# Patient Record
Sex: Female | Born: 1955
Health system: Southern US, Community
[De-identification: ages and names within clinical notes are randomized; demographics above are authoritative.]

## PROBLEM LIST (undated history)

## (undated) DIAGNOSIS — I498 Other specified cardiac arrhythmias: Secondary | ICD-10-CM

## (undated) DIAGNOSIS — J309 Allergic rhinitis, unspecified: Secondary | ICD-10-CM

## (undated) DIAGNOSIS — R5383 Other fatigue: Secondary | ICD-10-CM

## (undated) DIAGNOSIS — G5600 Carpal tunnel syndrome, unspecified upper limb: Secondary | ICD-10-CM

## (undated) DIAGNOSIS — R5381 Other malaise: Secondary | ICD-10-CM

## (undated) DIAGNOSIS — R7301 Impaired fasting glucose: Secondary | ICD-10-CM

## (undated) DIAGNOSIS — N951 Menopausal and female climacteric states: Secondary | ICD-10-CM

## (undated) DIAGNOSIS — J45909 Unspecified asthma, uncomplicated: Secondary | ICD-10-CM

## (undated) DIAGNOSIS — G4733 Obstructive sleep apnea (adult) (pediatric): Secondary | ICD-10-CM

## (undated) DIAGNOSIS — H409 Unspecified glaucoma: Secondary | ICD-10-CM

## (undated) DIAGNOSIS — H269 Unspecified cataract: Secondary | ICD-10-CM

## (undated) DIAGNOSIS — M2550 Pain in unspecified joint: Secondary | ICD-10-CM

## (undated) DIAGNOSIS — M1990 Unspecified osteoarthritis, unspecified site: Secondary | ICD-10-CM

## (undated) DIAGNOSIS — E119 Type 2 diabetes mellitus without complications: Secondary | ICD-10-CM

## (undated) DIAGNOSIS — K219 Gastro-esophageal reflux disease without esophagitis: Secondary | ICD-10-CM

## (undated) DIAGNOSIS — I10 Essential (primary) hypertension: Secondary | ICD-10-CM

## (undated) HISTORY — DX: Pain in unspecified joint: M25.50

## (undated) HISTORY — PX: OTHER SURGICAL HISTORY: SHX169

## (undated) HISTORY — DX: Other malaise: R53.81

## (undated) HISTORY — DX: Allergic rhinitis, unspecified: J30.9

## (undated) HISTORY — DX: Unspecified cataract: H26.9

## (undated) HISTORY — DX: Unspecified glaucoma: H40.9

## (undated) HISTORY — PX: JOINT REPLACEMENT: SHX530

## (undated) HISTORY — DX: Menopausal and female climacteric states: N95.1

## (undated) HISTORY — PX: ABDOMINAL HYSTERECTOMY: SHX81

## (undated) HISTORY — DX: Other malaise: R53.83

## (undated) HISTORY — DX: Carpal tunnel syndrome, unspecified upper limb: G56.00

## (undated) HISTORY — DX: Syncope and collapse: R55

## (undated) HISTORY — DX: Other specified cardiac arrhythmias: I49.8

## (undated) HISTORY — DX: Impaired fasting glucose: R73.01

## (undated) HISTORY — DX: Morbid (severe) obesity due to excess calories: E66.01

## (undated) HISTORY — DX: Obstructive sleep apnea (adult) (pediatric): G47.33

## (undated) HISTORY — PX: TONSILLECTOMY: SUR1361

---

## 1998-06-25 ENCOUNTER — Other Ambulatory Visit: Admission: RE | Admit: 1998-06-25 | Discharge: 1998-06-25 | Payer: Self-pay | Admitting: Obstetrics and Gynecology

## 2000-11-13 ENCOUNTER — Emergency Department (HOSPITAL_COMMUNITY): Admission: EM | Admit: 2000-11-13 | Discharge: 2000-11-14 | Payer: Self-pay | Admitting: Emergency Medicine

## 2002-02-15 ENCOUNTER — Encounter: Admission: RE | Admit: 2002-02-15 | Discharge: 2002-05-16 | Payer: Self-pay | Admitting: Internal Medicine

## 2004-05-03 ENCOUNTER — Encounter: Admission: RE | Admit: 2004-05-03 | Discharge: 2004-05-03 | Payer: Self-pay | Admitting: Internal Medicine

## 2004-11-04 ENCOUNTER — Other Ambulatory Visit: Admission: RE | Admit: 2004-11-04 | Discharge: 2004-11-04 | Payer: Self-pay | Admitting: Family Medicine

## 2005-01-17 ENCOUNTER — Ambulatory Visit (HOSPITAL_COMMUNITY): Admission: RE | Admit: 2005-01-17 | Discharge: 2005-01-17 | Payer: Self-pay | Admitting: Gastroenterology

## 2005-07-29 ENCOUNTER — Emergency Department (HOSPITAL_COMMUNITY): Admission: EM | Admit: 2005-07-29 | Discharge: 2005-07-30 | Payer: Self-pay | Admitting: Emergency Medicine

## 2005-08-13 ENCOUNTER — Encounter: Admission: RE | Admit: 2005-08-13 | Discharge: 2005-11-11 | Payer: Self-pay | Admitting: Family Medicine

## 2005-08-22 ENCOUNTER — Encounter: Admission: RE | Admit: 2005-08-22 | Discharge: 2005-08-22 | Payer: Self-pay | Admitting: Gastroenterology

## 2005-09-18 ENCOUNTER — Emergency Department (HOSPITAL_COMMUNITY): Admission: EM | Admit: 2005-09-18 | Discharge: 2005-09-18 | Payer: Self-pay | Admitting: Emergency Medicine

## 2006-09-19 ENCOUNTER — Emergency Department (HOSPITAL_COMMUNITY): Admission: EM | Admit: 2006-09-19 | Discharge: 2006-09-19 | Payer: Self-pay | Admitting: Emergency Medicine

## 2008-02-21 LAB — HM PAP SMEAR: HM Pap smear: NORMAL

## 2008-03-13 ENCOUNTER — Encounter: Admission: RE | Admit: 2008-03-13 | Discharge: 2008-03-13 | Payer: Self-pay | Admitting: Gastroenterology

## 2008-06-15 ENCOUNTER — Emergency Department (HOSPITAL_COMMUNITY): Admission: EM | Admit: 2008-06-15 | Discharge: 2008-06-15 | Payer: Self-pay | Admitting: Emergency Medicine

## 2008-10-25 ENCOUNTER — Inpatient Hospital Stay (HOSPITAL_COMMUNITY): Admission: RE | Admit: 2008-10-25 | Discharge: 2008-10-30 | Payer: Self-pay | Admitting: Orthopedic Surgery

## 2009-03-14 ENCOUNTER — Inpatient Hospital Stay (HOSPITAL_COMMUNITY): Admission: RE | Admit: 2009-03-14 | Discharge: 2009-03-18 | Payer: Self-pay | Admitting: Orthopedic Surgery

## 2010-04-28 ENCOUNTER — Encounter: Admission: RE | Admit: 2010-04-28 | Discharge: 2010-04-28 | Payer: Self-pay | Admitting: Sports Medicine

## 2010-08-29 LAB — URINALYSIS, MICROSCOPIC ONLY
Bilirubin Urine: NEGATIVE
Bilirubin Urine: NEGATIVE
Glucose, UA: NEGATIVE mg/dL
Glucose, UA: NEGATIVE mg/dL
Ketones, ur: NEGATIVE mg/dL
Ketones, ur: NEGATIVE mg/dL
Nitrite: NEGATIVE
Nitrite: NEGATIVE
Protein, ur: NEGATIVE mg/dL
Protein, ur: NEGATIVE mg/dL
Specific Gravity, Urine: 1.012 (ref 1.005–1.030)
Specific Gravity, Urine: 1.013 (ref 1.005–1.030)
Urobilinogen, UA: 0.2 mg/dL (ref 0.0–1.0)
Urobilinogen, UA: 0.2 mg/dL (ref 0.0–1.0)
pH: 6 (ref 5.0–8.0)
pH: 7 (ref 5.0–8.0)

## 2010-08-29 LAB — CBC
HCT: 31.9 % — ABNORMAL LOW (ref 36.0–46.0)
HCT: 33.1 % — ABNORMAL LOW (ref 36.0–46.0)
HCT: 34 % — ABNORMAL LOW (ref 36.0–46.0)
HCT: 36.3 % (ref 36.0–46.0)
Hemoglobin: 10.8 g/dL — ABNORMAL LOW (ref 12.0–15.0)
Hemoglobin: 11.3 g/dL — ABNORMAL LOW (ref 12.0–15.0)
Hemoglobin: 11.4 g/dL — ABNORMAL LOW (ref 12.0–15.0)
Hemoglobin: 12.2 g/dL (ref 12.0–15.0)
MCHC: 33.7 g/dL (ref 30.0–36.0)
MCHC: 33.7 g/dL (ref 30.0–36.0)
MCHC: 33.9 g/dL (ref 30.0–36.0)
MCHC: 34 g/dL (ref 30.0–36.0)
MCV: 82.6 fL (ref 78.0–100.0)
MCV: 82.8 fL (ref 78.0–100.0)
MCV: 83 fL (ref 78.0–100.0)
MCV: 83.1 fL (ref 78.0–100.0)
Platelets: 253 10*3/uL (ref 150–400)
Platelets: 270 10*3/uL (ref 150–400)
Platelets: 286 10*3/uL (ref 150–400)
Platelets: 323 10*3/uL (ref 150–400)
Platelets: 364 10*3/uL (ref 150–400)
RBC: 3.86 MIL/uL — ABNORMAL LOW (ref 3.87–5.11)
RBC: 4 MIL/uL (ref 3.87–5.11)
RBC: 4.09 MIL/uL (ref 3.87–5.11)
RBC: 4.37 MIL/uL (ref 3.87–5.11)
RBC: 5.11 MIL/uL (ref 3.87–5.11)
RDW: 15.3 % (ref 11.5–15.5)
RDW: 15.9 % — ABNORMAL HIGH (ref 11.5–15.5)
RDW: 16 % — ABNORMAL HIGH (ref 11.5–15.5)
RDW: 16.5 % — ABNORMAL HIGH (ref 11.5–15.5)
WBC: 10.4 10*3/uL (ref 4.0–10.5)
WBC: 10.4 10*3/uL (ref 4.0–10.5)
WBC: 12.5 10*3/uL — ABNORMAL HIGH (ref 4.0–10.5)
WBC: 12.6 10*3/uL — ABNORMAL HIGH (ref 4.0–10.5)
WBC: 8.9 10*3/uL (ref 4.0–10.5)

## 2010-08-29 LAB — BASIC METABOLIC PANEL
BUN: 3 mg/dL — ABNORMAL LOW (ref 6–23)
BUN: 4 mg/dL — ABNORMAL LOW (ref 6–23)
BUN: 4 mg/dL — ABNORMAL LOW (ref 6–23)
BUN: 4 mg/dL — ABNORMAL LOW (ref 6–23)
BUN: 6 mg/dL (ref 6–23)
CO2: 27 mEq/L (ref 19–32)
CO2: 28 mEq/L (ref 19–32)
CO2: 28 mEq/L (ref 19–32)
CO2: 29 mEq/L (ref 19–32)
CO2: 32 mEq/L (ref 19–32)
Calcium: 8.6 mg/dL (ref 8.4–10.5)
Calcium: 8.8 mg/dL (ref 8.4–10.5)
Calcium: 8.9 mg/dL (ref 8.4–10.5)
Calcium: 9.1 mg/dL (ref 8.4–10.5)
Calcium: 9.2 mg/dL (ref 8.4–10.5)
Chloride: 92 mEq/L — ABNORMAL LOW (ref 96–112)
Chloride: 94 mEq/L — ABNORMAL LOW (ref 96–112)
Chloride: 95 mEq/L — ABNORMAL LOW (ref 96–112)
Chloride: 96 mEq/L (ref 96–112)
Chloride: 97 mEq/L (ref 96–112)
Creatinine, Ser: 0.59 mg/dL (ref 0.4–1.2)
Creatinine, Ser: 0.6 mg/dL (ref 0.4–1.2)
Creatinine, Ser: 0.66 mg/dL (ref 0.4–1.2)
Creatinine, Ser: 0.66 mg/dL (ref 0.4–1.2)
Creatinine, Ser: 0.75 mg/dL (ref 0.4–1.2)
GFR calc Af Amer: >60 mL/min (ref >60)
GFR calc Af Amer: >60 mL/min (ref >60)
GFR calc Af Amer: >60 mL/min (ref >60)
GFR calc Af Amer: >60 mL/min (ref >60)
GFR calc Af Amer: >60 mL/min (ref >60)
GFR calc non Af Amer: >60 mL/min (ref >60)
GFR calc non Af Amer: >60 mL/min (ref >60)
GFR calc non Af Amer: >60 mL/min (ref >60)
GFR calc non Af Amer: >60 mL/min (ref >60)
GFR calc non Af Amer: >60 mL/min (ref >60)
Glucose, Bld: 113 mg/dL — ABNORMAL HIGH (ref 70–99)
Glucose, Bld: 134 mg/dL — ABNORMAL HIGH (ref 70–99)
Glucose, Bld: 148 mg/dL — ABNORMAL HIGH (ref 70–99)
Glucose, Bld: 150 mg/dL — ABNORMAL HIGH (ref 70–99)
Glucose, Bld: 158 mg/dL — ABNORMAL HIGH (ref 70–99)
Potassium: 2.9 mEq/L — ABNORMAL LOW (ref 3.5–5.1)
Potassium: 3.1 mEq/L — ABNORMAL LOW (ref 3.5–5.1)
Potassium: 3.1 mEq/L — ABNORMAL LOW (ref 3.5–5.1)
Potassium: 3.3 mEq/L — ABNORMAL LOW (ref 3.5–5.1)
Potassium: 3.5 mEq/L (ref 3.5–5.1)
Sodium: 130 mEq/L — ABNORMAL LOW (ref 135–145)
Sodium: 133 mEq/L — ABNORMAL LOW (ref 135–145)
Sodium: 134 mEq/L — ABNORMAL LOW (ref 135–145)
Sodium: 136 mEq/L (ref 135–145)
Sodium: 137 mEq/L (ref 135–145)

## 2010-08-29 LAB — URINE CULTURE
Colony Count: >=100000
Colony Count: NO GROWTH
Culture: NO GROWTH

## 2010-08-29 LAB — URINALYSIS, ROUTINE W REFLEX MICROSCOPIC
Bilirubin Urine: NEGATIVE
Glucose, UA: NEGATIVE mg/dL
Ketones, ur: NEGATIVE mg/dL
Nitrite: NEGATIVE
Specific Gravity, Urine: 1.021 (ref 1.005–1.030)
pH: 5.5 (ref 5.0–8.0)

## 2010-08-29 LAB — COMPREHENSIVE METABOLIC PANEL
ALT: 16 U/L (ref 0–35)
AST: 20 U/L (ref 0–37)
Albumin: 4.7 g/dL (ref 3.5–5.2)
CO2: 27 mEq/L (ref 19–32)
Chloride: 102 mEq/L (ref 96–112)
GFR calc Af Amer: >60 mL/min (ref >60)
GFR calc non Af Amer: >60 mL/min (ref >60)
Sodium: 139 mEq/L (ref 135–145)
Total Bilirubin: 0.5 mg/dL (ref 0.3–1.2)

## 2010-08-29 LAB — PROTIME-INR
INR: 1.07 (ref 0.00–1.49)
INR: 1.41 (ref 0.00–1.49)
INR: 1.6 — ABNORMAL HIGH (ref 0.00–1.49)
INR: 1.7 — ABNORMAL HIGH (ref 0.00–1.49)
Prothrombin Time: 13.8 seconds (ref 11.6–15.2)
Prothrombin Time: 17.1 seconds — ABNORMAL HIGH (ref 11.6–15.2)
Prothrombin Time: 18.9 seconds — ABNORMAL HIGH (ref 11.6–15.2)
Prothrombin Time: 19.8 seconds — ABNORMAL HIGH (ref 11.6–15.2)

## 2010-08-29 LAB — TYPE AND SCREEN: Antibody Screen: NEGATIVE

## 2010-09-02 LAB — CBC
HCT: 35 % — ABNORMAL LOW (ref 36.0–46.0)
Hemoglobin: 11.6 g/dL — ABNORMAL LOW (ref 12.0–15.0)
MCHC: 33.2 g/dL (ref 30.0–36.0)
MCHC: 33.3 g/dL (ref 30.0–36.0)
MCHC: 33.8 g/dL (ref 30.0–36.0)
MCV: 83.8 fL (ref 78.0–100.0)
MCV: 83.9 fL (ref 78.0–100.0)
MCV: 84 fL (ref 78.0–100.0)
Platelets: 269 10*3/uL (ref 150–400)
Platelets: 288 10*3/uL (ref 150–400)
RBC: 4.08 MIL/uL (ref 3.87–5.11)
RDW: 15.7 % — ABNORMAL HIGH (ref 11.5–15.5)
RDW: 15.7 % — ABNORMAL HIGH (ref 11.5–15.5)

## 2010-09-02 LAB — BASIC METABOLIC PANEL
BUN: 4 mg/dL — ABNORMAL LOW (ref 6–23)
BUN: 4 mg/dL — ABNORMAL LOW (ref 6–23)
BUN: 5 mg/dL — ABNORMAL LOW (ref 6–23)
BUN: 5 mg/dL — ABNORMAL LOW (ref 6–23)
CO2: 29 mEq/L (ref 19–32)
CO2: 30 mEq/L (ref 19–32)
CO2: 31 mEq/L (ref 19–32)
Calcium: 8.8 mg/dL (ref 8.4–10.5)
Calcium: 9.2 mg/dL (ref 8.4–10.5)
Calcium: 9.2 mg/dL (ref 8.4–10.5)
Chloride: 98 mEq/L (ref 96–112)
Chloride: 99 mEq/L (ref 96–112)
Creatinine, Ser: 0.7 mg/dL (ref 0.4–1.2)
Creatinine, Ser: 0.71 mg/dL (ref 0.4–1.2)
Creatinine, Ser: 0.76 mg/dL (ref 0.4–1.2)
GFR calc Af Amer: >60 mL/min (ref >60)
GFR calc non Af Amer: >60 mL/min (ref >60)
GFR calc non Af Amer: >60 mL/min (ref >60)
Glucose, Bld: 104 mg/dL — ABNORMAL HIGH (ref 70–99)
Glucose, Bld: 131 mg/dL — ABNORMAL HIGH (ref 70–99)
Glucose, Bld: 156 mg/dL — ABNORMAL HIGH (ref 70–99)
Potassium: 3.4 mEq/L — ABNORMAL LOW (ref 3.5–5.1)
Sodium: 140 mEq/L (ref 135–145)

## 2010-09-02 LAB — URINALYSIS, MICROSCOPIC ONLY
Glucose, UA: NEGATIVE mg/dL
Ketones, ur: NEGATIVE mg/dL
Leukocytes, UA: NEGATIVE
Nitrite: NEGATIVE
Protein, ur: NEGATIVE mg/dL
pH: 6 (ref 5.0–8.0)

## 2010-09-02 LAB — PROTIME-INR
INR: 1.3 (ref 0.00–1.49)
INR: 1.3 (ref 0.00–1.49)
Prothrombin Time: 17.1 seconds — ABNORMAL HIGH (ref 11.6–15.2)
Prothrombin Time: 17.1 seconds — ABNORMAL HIGH (ref 11.6–15.2)

## 2010-09-02 LAB — URINE CULTURE: Colony Count: >=100000

## 2010-09-03 LAB — COMPREHENSIVE METABOLIC PANEL
Alkaline Phosphatase: 67 U/L (ref 39–117)
BUN: 13 mg/dL (ref 6–23)
Creatinine, Ser: 0.76 mg/dL (ref 0.4–1.2)
Glucose, Bld: 96 mg/dL (ref 70–99)
Potassium: 3.8 mEq/L (ref 3.5–5.1)
Total Protein: 7.2 g/dL (ref 6.0–8.3)

## 2010-09-03 LAB — URINALYSIS, ROUTINE W REFLEX MICROSCOPIC
Bilirubin Urine: NEGATIVE
Hgb urine dipstick: NEGATIVE
Protein, ur: NEGATIVE mg/dL
Urobilinogen, UA: 0.2 mg/dL (ref 0.0–1.0)

## 2010-09-03 LAB — PROTIME-INR
INR: 0.9 (ref 0.00–1.49)
Prothrombin Time: 12.6 seconds (ref 11.6–15.2)

## 2010-09-03 LAB — CBC
HCT: 40.5 % (ref 36.0–46.0)
Hemoglobin: 13.8 g/dL (ref 12.0–15.0)
MCHC: 34 g/dL (ref 30.0–36.0)
MCV: 82.5 fL (ref 78.0–100.0)
RDW: 15.9 % — ABNORMAL HIGH (ref 11.5–15.5)

## 2010-09-03 LAB — TYPE AND SCREEN
ABO/RH(D): B POS
Antibody Screen: NEGATIVE

## 2010-09-03 LAB — ABO/RH: ABO/RH(D): B POS

## 2010-10-08 NOTE — Discharge Summary (Signed)
Cheryl [name], Cheryl [name] NO.:  [id number]   MEDICAL RECORD NO.:  [id number]          PATIENT TYPE:  INP   LOCATION:  5010                         FACILITY:  [facility]   PHYSICIAN:  Loreta Ave, M.D. DATE OF BIRTH:  05-Sep-1955   DATE OF ADMISSION:  10/25/2008  DATE OF DISCHARGE:  10/30/2008                               DISCHARGE SUMMARY   FINAL DIAGNOSES:  1. Status post right total knee replacement for end-stage degenerative      joint disease.  2. Urinary tract infection positive urine culture.  3. Hypertension.  4. Gastroesophageal reflux disease.  5. Hyperlipidemia.  6. History of gastritis.  7. History of atypical angina.   HISTORY OF PRESENT ILLNESS:  A 55 year old black female with history of  end-stage DJD, right knee, and chronic pain presented to our office for  preop evaluation for total knee replacement.  She had progressively  worsening pain with failed response with conservative treatment.  Significant decrease in her daily activities due to the ongoing  complaint.   HOSPITAL COURSE:  On October 25, 2008 the patient was taken to the [facility] OR and a right total knee replacement procedure performed.  Surgeon  Mckinley Jewel, MD and assistant Zonia Kief, PA-C.  Anesthesia general.  No specimens.  EBL minimal.  Tourniquet time 71 minutes.  One Hemovac  drain placed.  There were no surgical or anesthesia complications, and  the patient was transferred to recovery in stable condition.  On October 26, 2008, patient doing well with good pain control.  No complaints.  Denied  chest pain, shortness of breath.  Temperature 98.8, pulse 87,  respirations 20, blood pressure 142/73.  WBC 10.4, hemoglobin 11.6,  hematocrit 35.0, platelets 288.  Sodium 140, potassium 3.4, chloride  103, CO2 29, BUN 5, creatinine 0.75, glucose 156, INR 1.1.  Dressing  clean, dry, intact.  Calf nontender, neurovascularly intact.  PT, OT  consults.  Started pharmacy protocol of  Coumadin and Lovenox for DVT  prophylaxis.  Discontinued morphine PCA.  On October 27, 2008, the patient  doing well with good pain control.  She has been somewhat drowsy from  her pain meds.  Temperature 100.5, pulse 84, respirations 20, blood  pressure 125/68.  WBC 11.7, hemoglobin 11.4, hematocrit 34.2, platelets  269.  Sodium 138, potassium 3.1, chloride 98, CO2 of 31, BUN 4,  creatinine 0.70, glucose 131, INR 1.3.  Wound looks good and staples  intact.  No sign of infection.  Calf nontender, neurovascularly intact.  Hemovac drain pulled.  The patient requesting skilled nursing facility  placement.  Given KCl 60 mEq p.o. x1 dose for hypokalemia.  Increased  WBC and temperature and ordered a UA/urine C and S this a.m.  On October 28, 2008, T max of 99.99.  Wound looks good.  No signs of infection.  She is  progressing with therapy.  On October 29, 2008, T max 99.9 again.  Chest  clear.  Wound looks good.  Staples intact.  Calf nontender,  neurovascularly intact.  Potassium 3.7, INR 1.6.  On October 30, 2008, the  patient doing well with good pain control.  She has progressed well with  therapy.  She is ready to discharge to a skilled nursing facility.  Temperature 98.4, pulse 73, respirations 18, blood pressure 125/81.  INR  1.8.  Urine culture positive for Proteus mirabilis.  Knee wound looks  good and staples intact.  No drainage or signs of infection.  Calf  nontender, neurovascularly intact.   DISPOSITION:  Transfer to a skilled nursing facility.   CONDITION ON DISCHARGE:  Good and stable.   MEDICATIONS:  1. Norco 5/325 1-2 tabs p.o. q.6-8 hours p.r.n. for pain.  2. Robaxin 500 mg 1 tablet p.o. q.6 hours p.r.n. for spasms.  3. Coumadin pharmacy protocol x4 weeks postoperative DVT prophylaxis.  4. Lovenox 30 mg 1 subcu injection q.12 hours.  Discontinue when      Coumadin is therapeutic with INR 2-3.  5. See home medication repeat reconciliation sheet that is attached in      chart.    INSTRUCTIONS:  While at the skilled center, the patient will continue to  work with PT/OT to improve ambulation and knee range of motion and  strengthening.  She is weightbearing as tolerated with walker and then  can progress to a cane as tolerated.  Daily dressing changes with 4 x 4  gauze and tape.  She is okay to shower but no tub soaking.  Do not apply  any creams or ointments to her incision.  Knee staples to be removed at  2 weeks postop, and this can be done in our office at followup visit.  Remain on Coumadin x4 weeks postop for DVT prophylaxis.  Maintain INR 2-  3 and then discontinue Lovenox when Coumadin is therapeutic.  Follow up  in the office with Dr. Eulah Pont when she is 2 weeks postop.  If there are  any questions or concerns regarding her knee, we can be notified  immediately at [phone].      Genene Churn. Denton Meek.      Loreta Ave, M.D.  Electronically Signed    JMO/MEDQ  D:  10/30/2008  T:  10/30/2008  Job:  [id number]

## 2010-10-08 NOTE — Op Note (Signed)
NAMELINDZEY, ZENT NO.:  [id number]   MEDICAL RECORD NO.:  [id number]          PATIENT TYPE:  INP   LOCATION:  5010                         FACILITY:  [facility]   PHYSICIAN:  Loreta Ave, M.D. DATE OF BIRTH:  [birth year]   DATE OF PROCEDURE:  10/25/2008  DATE OF DISCHARGE:                               OPERATIVE REPORT   PREOPERATIVE DIAGNOSES:  End-stage degenerative arthritis, right knee,  varus alignment, mild flexion contracture.   POSTOPERATIVE DIAGNOSES:  End-stage degenerative arthritis, right knee,  varus alignment, mild flexion contracture.   PROCEDURES:  Right total knee replacement with modified minimally  invasive approach. Stryker Triathlon prosthesis.  Cemented pegged  posterior stabilized #4 femoral component.  Cemented #5 tibial  component.  An 11-mm polyethylene insert.  Resurfacing 35-mm pegged  medial offset cemented patellar component.  Soft tissue balancing with  medial capsular release.   SURGEON:  Loreta Ave, MD   ASSISTANT:  Genene Churn. Barry Dienes, [place], present throughout the entire case and  necessary for timely completion of the procedure.   ANESTHESIA:  General.   BLOOD LOSS:  Minimal.   SPECIMENS:  None.   CULTURES:  None.   COMPLICATIONS:  None.   DRESSINGS:  Soft compressive with knee immobilizer.   DRAINS:  Hemovac x1.   TOURNIQUET TIME:  1 hour.   DESCRIPTION OF PROCEDURE:  The patient was brought to the operating room  and placed on the operating table in the supine position.  After  adequate anesthesia had been obtained, right knee examined.  Varus  alignment correctable to neutral.  Still fairly good flow extension and  flexion.  Stable ligaments.  Tourniquet applied.  Prepped and draped in  usual sterile fashion.  Exsanguinated and elevation with Esmarch.  Tourniquet was inflated to 350 mmHg.  Straight incision above the  patella down to tibial tubercle.  Medial arthrotomy, vastus splitting,  preserving quad  tendon for a modified minimally invasive approach.  Knee  exposed. Grade 4 change throughout.  Medial capsular release.  Distal  femur exposed.  Intramedullary guide placed.  A 10-mm resection set at 5  degrees valgus.  Using epicondylar axis, the femur was sized, cut, and  fitted for a #4 component.  The tibia exposed.  Extramedullary guide.  A  3-degree posterior slope cut.  Cut down below the defect on the medial  side.  Sized to #5 component.  All recess examined to be sure all  remnants of menisci and loose body spurs removed.  Trials put in place.  A #5 on the femur and #4 on the tibia.  With the 11-mm insert, nicely  balanced knee, full extension, full flexion, good correction mechanical  axis, and no lift off and flexion.  Tibia was marked for appropriate  rotation and hand reamed.  Patella exposed, posterior 10 mm removed.  Sized, drilled, and fitted for a 35-mm component with excellent tracking  with trials.  All trials removed.  Copious irrigation with pulse  irrigating device.  Cement prepared and placed on all components.  All  components were seated.  Polyethylene attached to the tibia and knee  reduced and patellar component held in place with a clamp.  Once the  cement had hardened, the knee was reexamined.  Full extension, full  flexion, good alignment, good stability, and good patellofemoral  tracking, all confirmed.  Hemovac was placed and brought out through a  separate stab wound.  Arthrotomy was closed with #1 Vicryl.  Skin and  subcutaneous tissue with Vicryl and staples.  Knee was injected with  Marcaine.  Hemovac clamped.  Sterile compressive dressing applied.  Tourniquet was deflated and removed.  Knee immobilizer applied.  Anesthesia reversed.  Brought to recovery room.  Tolerated the surgery  well.  No complications.      Loreta Ave, M.D.  Electronically Signed     DFM/MEDQ  D:  10/25/2008  T:  10/25/2008  Job:  [id number]

## 2010-10-08 NOTE — Discharge Summary (Signed)
NAMERICKIYA, PICARIELLO NO.:  [id number]   MEDICAL RECORD NO.:  [id number]          PATIENT TYPE:  INP   LOCATION:  5010                         FACILITY:  [facility]   PHYSICIAN:  Loreta Ave, M.D. DATE OF BIRTH:  [birth year]   DATE OF ADMISSION:  10/25/2008  DATE OF DISCHARGE:  10/30/2008                               DISCHARGE SUMMARY   ADDENDUM:   MEDICATIONS:  Cipro 500 mg 1 tablet p.o. b.i.d. x10 days.   INSTRUCTIONS:  The patient will take Cipro 500 mg 1 tablet p.o. b.i.d.  x10 days for a urinary tract infection.      Genene Churn. Denton Meek.      Loreta Ave, M.D.  Electronically Signed    JMO/MEDQ  D:  10/30/2008  T:  10/30/2008  Job:  [id number]

## 2010-10-11 NOTE — Op Note (Signed)
NAMETONNETTE, ZWIEBEL NO.:  [id number]   MEDICAL RECORD NO.:  [id number]          PATIENT TYPE:  AMB   LOCATION:  ENDO                         FACILITY:  [facility]   PHYSICIAN:  Petra Kuba, M.D.    DATE OF BIRTH:  Oct 30, 1955   DATE OF PROCEDURE:  01/17/2005  DATE OF DISCHARGE:                                 OPERATIVE REPORT   PROCEDURE:  Colonoscopy.   ENDOSCOPIST:  Petra Kuba, M.D.   INDICATIONS:  Abdominal pain and bloating, due for colonic screening.   INFORMED CONSENT:  Consent was signed after risks, benefits, methods and  options were thoroughly discussed by my nurse Jan in the office.   MEDICINES USED:  Demerol 70 mg, Versed 7.5 mg.   PROCEDURE:  Rectal inspection was pertinent for external hemorrhoids, small.  Digital exam was negative.  Video pediatric adjustable colonoscope was  inserted, easily advanced around the colon to the cecum; this did require  some abdominal pressure.  No abnormality was seen on insertion.  Cecum was  identified by the appendiceal orifice and ileocecal valve.  In fact, the  scope was inserted a short stay into the terminal ileum, which was normal.  Photo-documentation was obtained.  The scope was slowly withdrawn.  Prep was  adequate.  There was some liquid stool that required washing and suctioning.  On slow withdrawal through the colon, no abnormalities were seen,  specifically no polyps, tumors, masses or diverticula.  Once back in the  rectum, anorectal pull-through and retroflexion confirmed some small  hemorrhoids.  Scope was straightened and readvanced a short ways up the left  side of the colon, air was suctioned and scope removed.  The patient  tolerated the procedure well.  There was no obvious immediate complication.   ENDOSCOPIC DIAGNOSES:  1.  Internal and external hemorrhoids.  2.  Otherwise within normal limits to the cecum and the terminal ileum.   PLAN:  Happy to see back p.r.n. in the office.  To the  nurse she may have  had some upper tract symptoms.  She was started on Prilosec and she did not  complain of any GI complaints other than above to me, but happy to see back  p.r.n., otherwise repeat screening in 5 years, return care to Dr. Cliffton Asters for  any further workup plans like possible gynecologic workup, CAT scan if her  pain continues, trial of antispasmodic, etc.           ______________________________  Petra Kuba, M.D.     MEM/MEDQ  D:  01/17/2005  T:  01/18/2005  Job:  [id number]   cc:   Stacie Acres. Cliffton Asters, M.D.  Fax: [phone]

## 2011-04-14 ENCOUNTER — Ambulatory Visit: Payer: Self-pay | Admitting: Family Medicine

## 2011-09-08 ENCOUNTER — Ambulatory Visit: Payer: Self-pay | Admitting: Family Medicine

## 2011-09-29 ENCOUNTER — Encounter: Payer: Self-pay | Admitting: *Deleted

## 2011-09-29 ENCOUNTER — Encounter: Payer: Federal, State, Local not specified - PPO | Attending: Internal Medicine | Admitting: *Deleted

## 2011-09-29 NOTE — Progress Notes (Signed)
  Medical Nutrition Therapy:  Appt start time: 1530 end time:  1630.   Assessment:  Primary concerns today: obesity.   MEDICATIONS: see list    DIETARY INTAKE:  Usual eating pattern includes 3 meals and 2 snacks per day.  Everyday foods include sandwiches, meats, starches, fruits, and vegetables.  Avoided foods include milk- possibly lactose intollerant.    24-hr recall:  B ( AM): , leftovers, fastfood, water  Snk ( AM): none  L ( PM): tuna sandwich, sweet tea and water Snk ( PM): flavored greek yogurt and 1cup  goldfish crackers, and crangrape light juice 4-6 oz D ( PM):usually baked meat, starch, vegetable, water Snk ( PM): 15-20 grapesBeverages: coffee with 2 heaping spoons sugar and creamer, crystal light, water  Usual physical activity: sedentary.  Stands at work, but not walking. 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  Has stationary bike and elliptical machine at home, but doesn't use them  Progress Towards Goal(s):  In progress.   Nutritional Diagnosis:  [place]-3.3 Overweight/obesity As related to large portions and inactivity.  As evidenced by BMI of 43.7.    Intervention:  Nutrition counseling provided.  Focused on MyPlate recommendations and encouraged more fresh fruits and vegetables.  Discussed sodium content of foods and fat in foods.  Discussed saturated vs unsaturated fats and importance of limiting fats for weight maintenance and limiting sodium for blood pressure control  Handouts given during visit include:  MyPlate  Food label handout  Yellow meal planner card  Monitoring/Evaluation:  Dietary intake, exercise, and body weight in 6 week(s).

## 2011-09-29 NOTE — Patient Instructions (Addendum)
Plan: Aim to follow MyPlate recommendations: half plate is fruits and vegetables; 1/4 plate is lean meat and 1/4 is starch Will try pork loin from grocery store as lean meat product Will try [place] bacon or canadian bacon, in moderation as alternative to regular bacon Will check food labels for sodium and will aim for no more 800mg  sodium per meal Will also check for fat content and will aim for 5-10 g fat per meal Will follow suggestions for portion control using yellow card as guide Will use more unsaturated fats than saturated in cooking Will try to use stationary bike or ellipticall  20 min on weekends in 10 min intervals Will try to use stationary bike or elliptical 10 min intervals as possible during the week  Wear slip-on shoes to next appointment

## 2011-11-10 ENCOUNTER — Ambulatory Visit: Payer: Federal, State, Local not specified - PPO | Admitting: *Deleted

## 2011-11-19 ENCOUNTER — Other Ambulatory Visit: Payer: Self-pay | Admitting: Orthopedic Surgery

## 2011-11-19 DIAGNOSIS — M25511 Pain in right shoulder: Secondary | ICD-10-CM

## 2011-11-23 ENCOUNTER — Ambulatory Visit
Admission: RE | Admit: 2011-11-23 | Discharge: 2011-11-23 | Disposition: A | Discharge status: Home / Self Care | Payer: Federal, State, Local not specified - PPO | Source: Ambulatory Visit | Attending: Orthopedic Surgery | Admitting: Orthopedic Surgery

## 2011-11-23 DIAGNOSIS — M25511 Pain in right shoulder: Secondary | ICD-10-CM

## 2011-12-13 ENCOUNTER — Ambulatory Visit
Admission: RE | Admit: 2011-12-13 | Discharge: 2011-12-13 | Disposition: A | Discharge status: Home / Self Care | Payer: Federal, State, Local not specified - PPO | Source: Ambulatory Visit | Attending: Orthopedic Surgery | Admitting: Orthopedic Surgery

## 2012-03-05 ENCOUNTER — Encounter (HOSPITAL_BASED_OUTPATIENT_CLINIC_OR_DEPARTMENT_OTHER): Payer: Self-pay | Admitting: *Deleted

## 2012-03-05 NOTE — Progress Notes (Signed)
Bring all medications. Pack an overnight bag. Coming Monday for EKG and BMET.

## 2012-03-08 ENCOUNTER — Encounter (HOSPITAL_BASED_OUTPATIENT_CLINIC_OR_DEPARTMENT_OTHER)
Admission: RE | Admit: 2012-03-08 | Discharge: 2012-03-08 | Disposition: A | Discharge status: Home / Self Care | Payer: Federal, State, Local not specified - PPO | Source: Ambulatory Visit | Attending: Orthopedic Surgery | Admitting: Orthopedic Surgery

## 2012-03-08 LAB — BASIC METABOLIC PANEL
CO2: 28 mEq/L (ref 19–32)
Calcium: 9.9 mg/dL (ref 8.4–10.5)
Creatinine, Ser: 0.8 mg/dL (ref 0.50–1.10)
GFR calc non Af Amer: 80 mL/min — ABNORMAL LOW (ref >90)
Glucose, Bld: 95 mg/dL (ref 70–99)
Sodium: 138 mEq/L (ref 135–145)

## 2012-03-10 NOTE — H&P (Signed)
Cheryl [name]/Cheryl [name] [address]   [address] [place], [place] [address] [phone] A Division of [name]  Cheryl [name], M.D.     Cheryl [name], M.D.     Cheryl [name], M.D. Cheryl [name], M.D.    Cheryl [name], M.D. Cheryl [name], M.D. Cheryl [name], D.O.          Cheryl Churn. Barry Dienes, PA-C            Cheryl A. Shepperson, PA-C Cheryl [name], Cheryl [name]   RE: Cheryl [name], Cheryl [name]                                [id number]      DOB: 05/21/1956 PROGRESS NOTE: 11-10-11 [age] year-old black female who comes into the office today with complaints of right shoulder pain.  We evaluated her right shoulder on May 21, 2010 and she was diagnosed with impingement syndrome.  Subacromial Depo-Medrol/Marcaine injection performed at that visit gave good relief up until a couple of months ago.  Pain aggravated with overhead activity and reaching behind her back.  It does bother her whenever she lies on her right side.  She is wanting to try another injection.    EXAMINATION: Pleasant black female, alert and oriented x 3 and in no acute distress.  Cervical spine unremarkable.  Bilateral shoulders good range of motion, but she does have more discomfort on the right.  Positive right greater than left impingement test.  Negative drop arm test.  On the right she has pain with supraspinatus resistance.  Trace cuff weakness.  Neurovascularly intact.  Skin warm and dry.  No increase in respiratory effort.   X-RAYS: Left shoulder, AP, outlet and axillary views, show AC degenerative changes and a Type II-III acromion.  Again, she does have an os acromiale.  No acute changes.   IMPRESSION: Right shoulder pain secondary to impingement syndrome.  Question cuff tear.   PLAN: Advised patient that we will attempt conservative treatment with one more injection.  Follow up in the office p.r.n.  If she does not have any improvement in a couple of weeks she  will call and let us know and we will schedule an MRI scan to rule out rotator cuff tear.  All questions answered.  PROCEDURE NOTE: The patient's clinical condition is marked by substantial pain and/or significant functional disability.  Other conservative therapy has not provided relief, is contraindicated, or not appropriate.  There is a reasonable likelihood that injection will significantly improve the patient's pain and/or functional disability. After patient consent the left shoulder was prepped with Betadine after using 2 cc of 1% Xylocaine for local anesthetic, subacromial 1:4 Depo-Medrol/Marcaine injection performed from a lateral approach.  Tolerated procedure well without complication.   Cheryl Churn. Barry Dienes, PA-C   Electronically verified by Cheryl [name], M.D. JMO:jjh D 11-10-11 T 11-11-11  Cheryl [name]/Cheryl [name] [address]   [address] [place],  [address] [phone] A Division of [name]  Cheryl [name], M.D.     Cheryl [name], M.D.     Cheryl [name], M.D. Cheryl [name], M.D.    Cheryl [name], M.D. Cheryl [name], M.D. Cheryl Churn. Barry Dienes, PA-C            Cheryl A. Shepperson, PA-C Cheryl [name], Cheryl [name]   RE: Cheryl [name], Cheryl [name]  [id number]      DOB: September 20, 1955 PROGRESS NOTE: 12-16-11 Cheryl [name] comes in for follow up.  I went over her MRI scan of her right shoulder.  This is an issue that we have been dealing with since initial evaluation in December of 2011.  Continues to get steadily worse rather than better.  Workup and treatment to date reviewed.  Recent MRI scan reviewed in regards to the scan and report and shared with her.  There is focal thickness bursa tearing anterior aspect distal supraspinatus tendon, which is fairly considerable.  Marked hypertrophy.  Chronic degenerative changes of the entire cuff, especially supraspinatus and infraspinatus.  Prominent subacromial and reactive  bursitis.    DISPOSITION:  More than 25 minutes spent face-to-face covering all of this with [name].  At this point in time we really have no options, but operative intervention and she completely understands that.  Exam under anesthesia, arthroscopy, decompression and debridement.  Adding rotator cuff repair if a significant enough partial tear is found.  What to expect intra and [name]-op, depending on whether or not we fix her cuff has been reviewed.  All questions answered.  Paperwork complete.  I will see her at the time of operative intervention.  Degree of time out of work is really going to depend on whether or not we have to do a cuff repair.    Cheryl [name], M.D.   Electronically verified by Cheryl [name], M.D. DFM:jjh D 12-17-11 T 12-18-11

## 2012-03-11 ENCOUNTER — Ambulatory Visit (HOSPITAL_BASED_OUTPATIENT_CLINIC_OR_DEPARTMENT_OTHER): Payer: Federal, State, Local not specified - PPO | Admitting: Anesthesiology

## 2012-03-11 ENCOUNTER — Ambulatory Visit (HOSPITAL_BASED_OUTPATIENT_CLINIC_OR_DEPARTMENT_OTHER)
Admission: RE | Admit: 2012-03-11 | Discharge: 2012-03-12 | Disposition: A | Discharge status: Home / Self Care | Payer: Federal, State, Local not specified - PPO | Source: Ambulatory Visit | Attending: Orthopedic Surgery | Admitting: Orthopedic Surgery

## 2012-03-11 ENCOUNTER — Encounter (HOSPITAL_BASED_OUTPATIENT_CLINIC_OR_DEPARTMENT_OTHER): Payer: Self-pay | Admitting: *Deleted

## 2012-03-11 ENCOUNTER — Encounter (HOSPITAL_BASED_OUTPATIENT_CLINIC_OR_DEPARTMENT_OTHER): Payer: Self-pay | Admitting: Anesthesiology

## 2012-03-11 ENCOUNTER — Encounter (HOSPITAL_BASED_OUTPATIENT_CLINIC_OR_DEPARTMENT_OTHER)
Admission: RE | Disposition: A | Discharge status: Home / Self Care | Payer: Self-pay | Source: Ambulatory Visit | Attending: Orthopedic Surgery

## 2012-03-11 DIAGNOSIS — M899 Disorder of bone, unspecified: Secondary | ICD-10-CM | POA: Insufficient documentation

## 2012-03-11 DIAGNOSIS — Z9889 Other specified postprocedural states: Secondary | ICD-10-CM

## 2012-03-11 DIAGNOSIS — M25819 Other specified joint disorders, unspecified shoulder: Secondary | ICD-10-CM | POA: Insufficient documentation

## 2012-03-11 DIAGNOSIS — E119 Type 2 diabetes mellitus without complications: Secondary | ICD-10-CM | POA: Insufficient documentation

## 2012-03-11 DIAGNOSIS — M24119 Other articular cartilage disorders, unspecified shoulder: Secondary | ICD-10-CM | POA: Insufficient documentation

## 2012-03-11 DIAGNOSIS — J45909 Unspecified asthma, uncomplicated: Secondary | ICD-10-CM | POA: Insufficient documentation

## 2012-03-11 DIAGNOSIS — M75110 Incomplete rotator cuff tear or rupture of unspecified shoulder, not specified as traumatic: Secondary | ICD-10-CM | POA: Insufficient documentation

## 2012-03-11 DIAGNOSIS — I10 Essential (primary) hypertension: Secondary | ICD-10-CM | POA: Insufficient documentation

## 2012-03-11 HISTORY — DX: Type 2 diabetes mellitus without complications: E11.9

## 2012-03-11 HISTORY — DX: Essential (primary) hypertension: I10

## 2012-03-11 HISTORY — DX: Gastro-esophageal reflux disease without esophagitis: K21.9

## 2012-03-11 HISTORY — DX: Unspecified asthma, uncomplicated: J45.909

## 2012-03-11 HISTORY — DX: Unspecified osteoarthritis, unspecified site: M19.90

## 2012-03-11 LAB — POCT HEMOGLOBIN-HEMACUE: Hemoglobin: 14.5 g/dL (ref 12.0–15.0)

## 2012-03-11 SURGERY — SHOULDER ARTHROSCOPY WITH ROTATOR CUFF REPAIR AND SUBACROMIAL DECOMPRESSION
Anesthesia: General | Site: Shoulder | Laterality: Right | Wound class: Clean

## 2012-03-11 MED ORDER — FENTANYL CITRATE 0.05 MG/ML IJ SOLN: 50.0000 ug | INTRAMUSCULAR | Status: DC | PRN

## 2012-03-11 MED ORDER — SODIUM CHLORIDE 0.9 % IR SOLN
Status: DC | PRN
  Administered 2012-03-11: 4

## 2012-03-11 MED ORDER — ONDANSETRON HCL 4 MG/2ML IJ SOLN
INTRAMUSCULAR | Status: DC | PRN
  Administered 2012-03-11: 4 mg via INTRAVENOUS

## 2012-03-11 MED ORDER — DEXTROSE 5 % IV SOLN: 3.0000 g | INTRAVENOUS | Status: DC

## 2012-03-11 MED ORDER — FENTANYL CITRATE 0.05 MG/ML IJ SOLN
INTRAMUSCULAR | Status: DC | PRN
  Administered 2012-03-11: 50 ug via INTRAVENOUS

## 2012-03-11 MED ORDER — DEXAMETHASONE SODIUM PHOSPHATE 4 MG/ML IJ SOLN
INTRAMUSCULAR | Status: DC | PRN
  Administered 2012-03-11: 10 mg via INTRAVENOUS

## 2012-03-11 MED ORDER — METHOCARBAMOL 100 MG/ML IJ SOLN: 500.0000 mg | Freq: Four times a day (QID) | INTRAVENOUS | Status: DC | PRN

## 2012-03-11 MED ORDER — HYDROCODONE-ACETAMINOPHEN 10-325 MG PO TABS: 1.0000 | ORAL_TABLET | ORAL | Status: DC | PRN

## 2012-03-11 MED ORDER — SODIUM CHLORIDE 0.9 % IV SOLN
INTRAVENOUS | Status: DC
  Administered 2012-03-11: 18:00:00 via INTRAVENOUS

## 2012-03-11 MED ORDER — MIDAZOLAM HCL 2 MG/2ML IJ SOLN
1.0000 mg | INTRAMUSCULAR | Status: DC | PRN
  Administered 2012-03-11: 2 mg via INTRAVENOUS

## 2012-03-11 MED ORDER — HYDROMORPHONE HCL PF 1 MG/ML IJ SOLN: 0.2500 mg | INTRAMUSCULAR | Status: DC | PRN

## 2012-03-11 MED ORDER — METOCLOPRAMIDE HCL 5 MG/ML IJ SOLN: 5.0000 mg | Freq: Three times a day (TID) | INTRAMUSCULAR | Status: DC | PRN

## 2012-03-11 MED ORDER — METHOCARBAMOL 500 MG PO TABS: 500.0000 mg | ORAL_TABLET | Freq: Four times a day (QID) | ORAL | Status: DC | PRN

## 2012-03-11 MED ORDER — DROPERIDOL 2.5 MG/ML IJ SOLN: 0.6250 mg | INTRAMUSCULAR | Status: DC | PRN

## 2012-03-11 MED ORDER — CEFAZOLIN SODIUM-DEXTROSE 2-3 GM-% IV SOLR: 2.0000 g | INTRAVENOUS | Status: DC

## 2012-03-11 MED ORDER — LABETALOL HCL 5 MG/ML IV SOLN
INTRAVENOUS | Status: DC | PRN
  Administered 2012-03-11: 2.5 mg via INTRAVENOUS
  Administered 2012-03-11: 5 mg via INTRAVENOUS

## 2012-03-11 MED ORDER — PROPOFOL 10 MG/ML IV BOLUS
INTRAVENOUS | Status: DC | PRN
  Administered 2012-03-11 (×2): 50 mg via INTRAVENOUS
  Administered 2012-03-11: 200 mg via INTRAVENOUS

## 2012-03-11 MED ORDER — ONDANSETRON HCL 4 MG/2ML IJ SOLN: 4.0000 mg | Freq: Four times a day (QID) | INTRAMUSCULAR | Status: DC | PRN

## 2012-03-11 MED ORDER — ONDANSETRON HCL 4 MG PO TABS: 4.0000 mg | ORAL_TABLET | Freq: Four times a day (QID) | ORAL | Status: DC | PRN

## 2012-03-11 MED ORDER — METOCLOPRAMIDE HCL 5 MG PO TABS: 5.0000 mg | ORAL_TABLET | Freq: Three times a day (TID) | ORAL | Status: DC | PRN

## 2012-03-11 MED ORDER — HYDROMORPHONE HCL PF 1 MG/ML IJ SOLN: 0.5000 mg | INTRAMUSCULAR | Status: DC | PRN

## 2012-03-11 MED ORDER — LACTATED RINGERS IV SOLN
INTRAVENOUS | Status: DC
  Administered 2012-03-11 (×2): via INTRAVENOUS

## 2012-03-11 MED ORDER — SUCCINYLCHOLINE CHLORIDE 20 MG/ML IJ SOLN
INTRAMUSCULAR | Status: DC | PRN
  Administered 2012-03-11: 100 mg via INTRAVENOUS

## 2012-03-11 SURGICAL SUPPLY — 71 items
ANCH SUT SWLK 19.1X5.5 CLS EL (Anchor) ×2 IMPLANT
ANCHOR PEEK SWIVEL LOCK 5.5 (Anchor) ×4 IMPLANT
BENZOIN TINCTURE PRP APPL 2/3 (GAUZE/BANDAGES/DRESSINGS) IMPLANT
BLADE CUTTER GATOR 3.5 (BLADE) ×2 IMPLANT
BLADE CUTTER MENIS 5.5 (BLADE) IMPLANT
BLADE GREAT WHITE 4.2 (BLADE) ×2 IMPLANT
BLADE SURG 15 STRL LF DISP TIS (BLADE) IMPLANT
BLADE SURG 15 STRL SS (BLADE)
BUR OVAL 6.0 (BURR) ×2 IMPLANT
CANISTER OMNI JUG 16 LITER (MISCELLANEOUS) ×2 IMPLANT
CANISTER SUCTION 2500CC (MISCELLANEOUS) IMPLANT
CANNULA DRY DOC 8X75 (CANNULA) ×2 IMPLANT
CANNULA TWIST IN 8.25X7CM (CANNULA) IMPLANT
CLOTH BEACON ORANGE TIMEOUT ST (SAFETY) ×2 IMPLANT
DECANTER SPIKE VIAL GLASS SM (MISCELLANEOUS) IMPLANT
DRAPE OEC MINIVIEW 54X84 (DRAPES) IMPLANT
DRAPE STERI 35X30 U-POUCH (DRAPES) ×2 IMPLANT
DRAPE U-SHAPE 47X51 STRL (DRAPES) ×2 IMPLANT
DRAPE U-SHAPE 76X120 STRL (DRAPES) ×4 IMPLANT
DRSG PAD ABDOMINAL 8X10 ST (GAUZE/BANDAGES/DRESSINGS) ×2 IMPLANT
DURAPREP 26ML APPLICATOR (WOUND CARE) ×2 IMPLANT
ELECT MENISCUS 165MM 90D (ELECTRODE) ×2 IMPLANT
ELECT NEEDLE TIP 2.8 STRL (NEEDLE) IMPLANT
ELECT REM PT RETURN 9FT ADLT (ELECTROSURGICAL) ×2
ELECTRODE REM PT RTRN 9FT ADLT (ELECTROSURGICAL) ×1 IMPLANT
GAUZE XEROFORM 1X8 LF (GAUZE/BANDAGES/DRESSINGS) ×2 IMPLANT
GLOVE BIO SURGEON STRL SZ 6.5 (GLOVE) ×2 IMPLANT
GLOVE BIOGEL PI IND STRL 8 (GLOVE) ×1 IMPLANT
GLOVE BIOGEL PI INDICATOR 8 (GLOVE) ×1
GLOVE INDICATOR 7.0 STRL GRN (GLOVE) ×2 IMPLANT
GLOVE ORTHO TXT STRL SZ7.5 (GLOVE) ×4 IMPLANT
GOWN PREVENTION PLUS XLARGE (GOWN DISPOSABLE) ×4 IMPLANT
GOWN STRL REIN 2XL XLG LVL4 (GOWN DISPOSABLE) ×2 IMPLANT
NDL SUT 6 .5 CRC .975X.05 MAYO (NEEDLE) IMPLANT
NEEDLE MAYO TAPER (NEEDLE)
NEEDLE SCORPION MULTI FIRE (NEEDLE) ×2 IMPLANT
NS IRRIG 1000ML POUR BTL (IV SOLUTION) IMPLANT
PACK ARTHROSCOPY DSU (CUSTOM PROCEDURE TRAY) ×2 IMPLANT
PACK BASIN DAY SURGERY FS (CUSTOM PROCEDURE TRAY) ×2 IMPLANT
PASSER SUT SWANSON 36MM LOOP (INSTRUMENTS) IMPLANT
PENCIL BUTTON HOLSTER BLD 10FT (ELECTRODE) ×2 IMPLANT
SET ARTHROSCOPY TUBING (MISCELLANEOUS) ×1
SET ARTHROSCOPY TUBING LN (MISCELLANEOUS) ×1 IMPLANT
SLEEVE SCD COMPRESS KNEE MED (MISCELLANEOUS) ×2 IMPLANT
SLING ARM FOAM STRAP LRG (SOFTGOODS) IMPLANT
SLING ARM FOAM STRAP MED (SOFTGOODS) IMPLANT
SLING ARM FOAM STRAP XLG (SOFTGOODS) IMPLANT
SLING ARM IMMOBILIZER LRG (SOFTGOODS) ×2 IMPLANT
SLING ARM IMMOBILIZER MED (SOFTGOODS) IMPLANT
SPONGE GAUZE 4X4 12PLY (GAUZE/BANDAGES/DRESSINGS) ×4 IMPLANT
SPONGE LAP 4X18 X RAY DECT (DISPOSABLE) ×2 IMPLANT
STRIP CLOSURE SKIN 1/2X4 (GAUZE/BANDAGES/DRESSINGS) IMPLANT
SUCTION FRAZIER TIP 10 FR DISP (SUCTIONS) IMPLANT
SUT ETHIBOND 2 OS 4 DA (SUTURE) IMPLANT
SUT ETHILON 2 0 FS 18 (SUTURE) IMPLANT
SUT ETHILON 3 0 PS 1 (SUTURE) IMPLANT
SUT FIBERWIRE #2 38 T-5 BLUE (SUTURE)
SUT RETRIEVER MED (INSTRUMENTS) IMPLANT
SUT STEEL 4 (SUTURE) IMPLANT
SUT STEEL 5 (SUTURE) IMPLANT
SUT TIGER TAPE 7 IN WHITE (SUTURE) ×2 IMPLANT
SUT VIC AB 0 CT1 27 (SUTURE)
SUT VIC AB 0 CT1 27XBRD ANBCTR (SUTURE) IMPLANT
SUT VIC AB 2-0 SH 27 (SUTURE)
SUT VIC AB 2-0 SH 27XBRD (SUTURE) IMPLANT
SUT VIC AB 3-0 FS2 27 (SUTURE) IMPLANT
SUTURE FIBERWR #2 38 T-5 BLUE (SUTURE) IMPLANT
TAPE FIBER 2MM 7IN #2 BLUE (SUTURE) ×2 IMPLANT
TOWEL OR 17X24 6PK STRL BLUE (TOWEL DISPOSABLE) ×2 IMPLANT
WATER STERILE IRR 1000ML POUR (IV SOLUTION) ×2 IMPLANT
YANKAUER SUCT BULB TIP NO VENT (SUCTIONS) IMPLANT

## 2012-03-11 NOTE — Progress Notes (Signed)
Assisted Dr. Singer with right, ultrasound guided, interscalene  block. Side rails up, monitors on throughout procedure. See vital signs in flow sheet. Tolerated Procedure well. 

## 2012-03-11 NOTE — Transfer of Care (Signed)
Immediate Anesthesia Transfer of Care Note  Patient: Cheryl [name]  Procedure(s) Performed: Procedure(s) (LRB) with comments: SHOULDER ARTHROSCOPY WITH ROTATOR CUFF REPAIR AND SUBACROMIAL DECOMPRESSION (Right) - right shoulder arthroscopy shoulder decompression subacromial acromioplasty with coracoarcromial releasedistal claviculectomy , with rotator cuff repair,bursectomy excision of clavical  Patient Location: PACU  Anesthesia Type: GA combined with regional for post-op pain  Level of Consciousness: sedated and patient cooperative  Airway & Oxygen Therapy: Patient Spontanous Breathing and Patient connected to face mask oxygen  Post-op Assessment: Report given to PACU RN and Post -op Vital signs reviewed and stable  Post vital signs: Reviewed and stable  Complications: No apparent anesthesia complications

## 2012-03-11 NOTE — Interval H&P Note (Signed)
History and Physical Interval Note:  03/11/2012 7:33 AM  Cheryl [name]  has presented today for surgery, with the diagnosis of right shoulder impengement syndrome degenerative arthristis, ac joint, complete rupture of rotator cuff  The various methods of treatment have been discussed with the patient and family. After consideration of risks, benefits and other options for treatment, the patient has consented to  Procedure(s) (LRB) with comments: SHOULDER ARTHROSCOPY WITH ROTATOR CUFF REPAIR AND SUBACROMIAL DECOMPRESSION (Right) - right shoulder arthroscopy shoulder decompression subacromial acromioplasty with coracoarcromial releasedistal claviculectomy , with rotator cuff repair  as a surgical intervention .  The patient's history has been reviewed, patient examined, no change in status, stable for surgery.  I have reviewed the patient's chart and labs.  Questions were answered to the patient's satisfaction.     [name]

## 2012-03-11 NOTE — Anesthesia Preprocedure Evaluation (Signed)
Anesthesia Evaluation  Patient identified by MRN, date of birth, ID band Patient awake    Reviewed: Allergy & Precautions, H&P , NPO status , Patient's Chart, lab work & pertinent test results  Airway Mallampati: II TM Distance: >3 FB Neck ROM: Full    Dental  (+) Teeth Intact and Dental Advisory Given   Pulmonary asthma ,    Pulmonary exam normal       Cardiovascular hypertension, Pt. on medications     Neuro/Psych negative neurological ROS     GI/Hepatic Neg liver ROS, GERD-  ,  Endo/Other  diabetes  Renal/GU negative Renal ROS     Musculoskeletal   Abdominal   Peds  Hematology negative hematology ROS (+)   Anesthesia Other Findings   Reproductive/Obstetrics                           Anesthesia Physical Anesthesia Plan  ASA: III  Anesthesia Plan: General   Post-op Pain Management:    Induction: Intravenous  Airway Management Planned: Oral ETT  Additional Equipment:   Intra-op Plan:   Post-operative Plan: Extubation in OR  Informed Consent: I have reviewed the patients History and Physical, chart, labs and discussed the procedure including the risks, benefits and alternatives for the proposed anesthesia with the patient or authorized representative who has indicated his/her understanding and acceptance.   Dental advisory given  Plan Discussed with: CRNA, Anesthesiologist and Surgeon  Anesthesia Plan Comments:         Anesthesia Quick Evaluation

## 2012-03-11 NOTE — Anesthesia Postprocedure Evaluation (Signed)
Anesthesia Post Note  Patient: Cheryl [name]  Procedure(s) Performed: Procedure(s) (LRB): SHOULDER ARTHROSCOPY WITH ROTATOR CUFF REPAIR AND SUBACROMIAL DECOMPRESSION (Right)  Anesthesia type: general  Patient location: PACU  Post pain: Pain level controlled  Post assessment: Patient's Cardiovascular Status Stable  Last Vitals:  Filed Vitals:   03/11/12 1530  BP: 131/65  Pulse: 75  Temp:   Resp: 20    Post vital signs: Reviewed and stable  Level of consciousness: sedated  Complications: No apparent anesthesia complications

## 2012-03-11 NOTE — Brief Op Note (Signed)
03/11/2012  2:33 PM  PATIENT:  Cheryl [name]  56 y.o. female  PRE-OPERATIVE DIAGNOSIS:  right shoulder impengement syndrome degenerative arthristis, ac joint, complete rupture of rotator cuff  POST-OPERATIVE DIAGNOSIS:  * No post-op diagnosis entered *  PROCEDURE:  Procedure(s) (LRB) with comments: SHOULDER ARTHROSCOPY WITH ROTATOR CUFF REPAIR AND SUBACROMIAL DECOMPRESSION (Right) - right shoulder arthroscopy shoulder decompression subacromial acromioplasty with coracoarcromial releasedistal claviculectomy , with rotator cuff repair,bursectomy excision of clavical  SURGEON:  Surgeon(s) and Role:    * Loreta Ave, MD - Primary  PHYSICIAN ASSISTANT: Zonia Kief M     ANESTHESIA:   general  EBL:  Total I/O In: 1000 [I.V.:1000] Out: -    SPECIMEN:  No Specimen  DISPOSITION OF SPECIMEN:  N/A  COUNTS:  YES  TOURNIQUET:  * No tourniquets in log *   PATIENT DISPOSITION:  PACU - hemodynamically stable.

## 2012-03-11 NOTE — Anesthesia Procedure Notes (Signed)
Procedure Name: Intubation Date/Time: 03/11/2012 1:08 PM Performed by: Gar Gibbon Pre-anesthesia Checklist: Patient identified, Emergency Drugs available, Suction available and Patient being monitored Patient Re-evaluated:Patient Re-evaluated prior to inductionOxygen Delivery Method: Circle System Utilized Preoxygenation: Pre-oxygenation with 100% oxygen Intubation Type: IV induction Ventilation: Mask ventilation without difficulty Laryngoscope Size: Mac and 3 Grade View: Grade III Tube type: Oral Tube size: 7.0 mm Number of attempts: 1 Airway Equipment and Method: stylet and oral airway Placement Confirmation: ETT inserted through vocal cords under direct vision,  positive ETCO2 and breath sounds checked- equal and bilateral Secured at: 22 cm Tube secured with: Tape Dental Injury: Teeth and Oropharynx as per pre-operative assessment

## 2012-03-12 NOTE — Op Note (Signed)
NAMEHOLLIE, Cheryl [name] NO.:  [id number]  MEDICAL RECORD NO.:  [id number]  LOCATION:                                 FACILITY:  PHYSICIAN:  Loreta Ave, M.D. DATE OF BIRTH:  [birth year]  DATE OF PROCEDURE:  03/11/2012 DATE OF DISCHARGE:                              OPERATIVE REPORT   PREOPERATIVE DIAGNOSIS:  Right shoulder impingement distal clavicle osteolysis, partial versus complete rotator cuff tear.  POSTOPERATIVE DIAGNOSIS:  Right shoulder impingement distal clavicle osteolysis, partial versus complete rotator cuff tear with complete tearing of supraspinatus tendon throughout the crescent region.  Complex circumferential labral tears.  PROCEDURE:  Right shoulder exam under anesthesia, arthroscopy. Debridement of rotator cuff and labrum.  Bursectomy, acromioplasty, CA ligament release.  Excision of distal clavicle.  Arthroscopic-assisted rotator cuff repair.  FiberWire suture x2, swivel lock anchors x2.  SURGEON:  Loreta Ave, M.D.  ASSISTANT:  Genene Churn. Barry Dienes, [place], present throughout the entire case and necessary for timely completion of procedure.  ANESTHESIA:  General.  BLOOD LOSS:  Minimal.  SPECIMENS:  None.  CULTURES:  None.  COMPLICATION:  None.  DRESSINGS:  Soft compressive shoulder immobilizer.  PROCEDURE:  The patient was brought to the operating room and placed on the operating table in supine position.  After adequate anesthesia had been obtained, shoulder was examined.  Full motion, stable shoulder. Placed in beach-chair position on the shoulder positioner, prepped and draped in usual sterile fashion.  Three portals; anterior, posterior and lateral.  Arthroscope was introduced, shoulder was distended and inspected.  Circumferential degenerative complex tearing of labrum debrided to a stable surface.  Biceps tendon and anchor intact.  Some mild changes on the glenoid, but most of the glenohumeral joint looked relatively  good.  Full-thickness tear of supraspinatus tendon throughout the crescent region.  Debrided, mobilized, tuberosity roughened. Cannula was redirected subacromially.  Type 2 acromion.  Bursa was resected.  Acromioplasty to a type 1 acromion with shaver and a high- speed bur.  Distal clavicle was exposed.  Lateral centimeter of clavicle and periarticular spurs resected.  Adequacy of decompression confirmed viewing from all portals.  Through a cannula laterally, the cuff was then captured with two horizontal mattress sutures with the scorpion device.  Anchored down into the roughened tuberosity with two swivel lock anchors.  Nice firm watertight closure with the cuff achieved. Instruments were completely removed.  Portals were closed with nylon. Sterile compressive dressing was applied.  Shoulder immobilizer was applied.  Anesthesia was reversed.  Brought to the recovery room. Tolerated the surgery well.  No complications.     Loreta Ave, M.D.     DFM/MEDQ  D:  03/11/2012  T:  03/12/2012  Job:  [id number]

## 2012-04-07 ENCOUNTER — Ambulatory Visit: Payer: Self-pay | Admitting: Obstetrics and Gynecology

## 2012-06-24 ENCOUNTER — Ambulatory Visit: Payer: Self-pay | Admitting: Obstetrics and Gynecology

## 2012-07-06 ENCOUNTER — Ambulatory Visit: Payer: Self-pay | Admitting: Obstetrics and Gynecology

## 2012-09-07 ENCOUNTER — Ambulatory Visit (INDEPENDENT_AMBULATORY_CARE_PROVIDER_SITE_OTHER): Payer: Federal, State, Local not specified - PPO | Admitting: Internal Medicine

## 2012-09-07 ENCOUNTER — Encounter: Payer: Self-pay | Admitting: Internal Medicine

## 2012-09-07 VITALS — BP 148/88 | HR 68 | Temp 97.2°F | Resp 16 | Ht 69.0 in | Wt 291.0 lb

## 2012-09-07 DIAGNOSIS — N951 Menopausal and female climacteric states: Secondary | ICD-10-CM | POA: Insufficient documentation

## 2012-09-07 DIAGNOSIS — T148 Other injury of unspecified body region: Secondary | ICD-10-CM | POA: Insufficient documentation

## 2012-09-07 DIAGNOSIS — R7301 Impaired fasting glucose: Secondary | ICD-10-CM | POA: Insufficient documentation

## 2012-09-07 DIAGNOSIS — I10 Essential (primary) hypertension: Secondary | ICD-10-CM

## 2012-09-07 DIAGNOSIS — B3731 Acute candidiasis of vulva and vagina: Secondary | ICD-10-CM | POA: Insufficient documentation

## 2012-09-07 DIAGNOSIS — R5383 Other fatigue: Secondary | ICD-10-CM | POA: Insufficient documentation

## 2012-09-07 DIAGNOSIS — G5600 Carpal tunnel syndrome, unspecified upper limb: Secondary | ICD-10-CM | POA: Insufficient documentation

## 2012-09-07 DIAGNOSIS — M2550 Pain in unspecified joint: Secondary | ICD-10-CM | POA: Insufficient documentation

## 2012-09-07 DIAGNOSIS — I152 Hypertension secondary to endocrine disorders: Secondary | ICD-10-CM | POA: Insufficient documentation

## 2012-09-07 DIAGNOSIS — I498 Other specified cardiac arrhythmias: Secondary | ICD-10-CM | POA: Insufficient documentation

## 2012-09-07 DIAGNOSIS — W57XXXA Bitten or stung by nonvenomous insect and other nonvenomous arthropods, initial encounter: Secondary | ICD-10-CM | POA: Insufficient documentation

## 2012-09-07 DIAGNOSIS — J3089 Other allergic rhinitis: Secondary | ICD-10-CM | POA: Insufficient documentation

## 2012-09-07 DIAGNOSIS — M159 Polyosteoarthritis, unspecified: Secondary | ICD-10-CM

## 2012-09-07 DIAGNOSIS — B373 Candidiasis of vulva and vagina: Secondary | ICD-10-CM | POA: Insufficient documentation

## 2012-09-07 DIAGNOSIS — R1084 Generalized abdominal pain: Secondary | ICD-10-CM | POA: Insufficient documentation

## 2012-09-07 DIAGNOSIS — R5381 Other malaise: Secondary | ICD-10-CM | POA: Insufficient documentation

## 2012-09-07 DIAGNOSIS — Z1211 Encounter for screening for malignant neoplasm of colon: Secondary | ICD-10-CM | POA: Insufficient documentation

## 2012-09-07 DIAGNOSIS — E1159 Type 2 diabetes mellitus with other circulatory complications: Secondary | ICD-10-CM | POA: Insufficient documentation

## 2012-09-07 NOTE — Progress Notes (Signed)
  Subjective:    Patient ID: Cheryl [name], female    DOB: [birth year], 58 y.o.   MRN: [id number]  CC- routine visit  HPI Patient was seeing dr Leanord Hawking before and is here for routine follow up. She has been complaint with her medications. Underwent rotator cuff tear in her right shoulder recently , follows with ortho and has been off norco and is taking tramadol prn for pain for now Her allergies have been under control with current regimen. Her joint pain exists but under better control with medications. She is using stationary bike 4-5 times a week for exercise. Has not had any weight changes recently. Her appetite is fair  Reviewed her prior lab results with a1c suggetsive of prediabetes. No recent lipid panel   Review of Systems  Constitutional: Negative for fever, chills, activity change, appetite change and unexpected weight change.  HENT: Negative for mouth sores.   Eyes: Negative for visual disturbance.  Respiratory: Negative for chest tightness and shortness of breath.   Cardiovascular: Negative for chest pain, palpitations and leg swelling.  Gastrointestinal: Negative for constipation and abdominal distention.  Genitourinary: Negative for dysuria, vaginal discharge and pelvic pain.  Musculoskeletal: Positive for arthralgias. Negative for gait problem.  Neurological: Negative for dizziness and light-headedness.  Hematological: Negative for adenopathy.  Psychiatric/Behavioral: Negative for behavioral problems and agitation.     Objective:   Physical Exam  Constitutional: She is oriented to person, place, and time. No distress.  Obese adult female  HENT:  Head: Normocephalic and atraumatic.  Mouth/Throat: Oropharynx is clear and moist.  Eyes: Conjunctivae are normal. Pupils are equal, round, and reactive to light.  Neck: Normal range of motion. Neck supple.  Cardiovascular: Normal rate and regular rhythm.   Pulmonary/Chest: Effort normal and breath sounds normal.  Abdominal:  Soft. Bowel sounds are normal.  Musculoskeletal: Normal range of motion. She exhibits no edema and no tenderness.  Lymphadenopathy:    She has no cervical adenopathy.  Neurological: She is alert and oriented to person, place, and time.  Skin: Skin is warm and dry. No rash noted. She is not diaphoretic.  Psychiatric: She has a normal mood and affect. Her behavior is normal.   BP 148/88  Pulse 68  Temp(Src) 97.2 F (36.2 C) (Oral)  Resp 16  Ht 5\' 9"  (1.753 m)  Wt 291 lb (131.997 kg)  BMI 42.95 kg/m2    Assessment & Plan:   HTN- repeat bp check was 136/84, continue current medication regimen, check bmp  Allergic rhinitis- symptoms under control. Continue current regimen  Impaired fasting glucose with a1c suggestive of prediabetes. Will recheck a1c and if > 6.5, with her age, htn, obesity, will start her on oral hypoglycemics. Check bmp and flp as well  Osteoarthritis- continue current pain regimen and monitor  Morbid obesity- need to lose weight, to keep bp under control, diet counselled about and encouraged to exercise.

## 2012-09-07 NOTE — Progress Notes (Signed)
Patient ID: Cheryl [name], female   DOB: Jul 25, 1955, 57 y.o.   MRN: [id number]  Reviewed her living will and scanned it in the system

## 2012-09-09 ENCOUNTER — Other Ambulatory Visit: Payer: Federal, State, Local not specified - PPO

## 2012-09-09 DIAGNOSIS — R7301 Impaired fasting glucose: Secondary | ICD-10-CM

## 2012-09-09 DIAGNOSIS — R5383 Other fatigue: Secondary | ICD-10-CM

## 2012-09-09 DIAGNOSIS — R5381 Other malaise: Secondary | ICD-10-CM

## 2012-09-10 LAB — LIPID PANEL
Chol/HDL Ratio: 4.5 ratio units — ABNORMAL HIGH (ref 0.0–4.4)
Cholesterol, Total: 250 mg/dL — ABNORMAL HIGH (ref 100–199)
Triglycerides: 104 mg/dL (ref 0–149)

## 2012-09-10 LAB — CBC WITH DIFFERENTIAL/PLATELET
Basophils Absolute: 0 10*3/uL (ref 0.0–0.2)
Eos: 3 % (ref 0–5)
HCT: 41.2 % (ref 34.0–46.6)
Hemoglobin: 14.2 g/dL (ref 11.1–15.9)
Lymphocytes Absolute: 2.1 10*3/uL (ref 0.7–3.1)
MCHC: 34.5 g/dL (ref 31.5–35.7)
Monocytes: 8 % (ref 4–12)
Neutrophils Absolute: 4.9 10*3/uL (ref 1.4–7.0)

## 2012-09-10 LAB — BASIC METABOLIC PANEL
BUN/Creatinine Ratio: 10 (ref 9–23)
BUN: 8 mg/dL (ref 6–24)
CO2: 26 mmol/L (ref 19–28)
Chloride: 100 mmol/L (ref 97–108)
Glucose: 103 mg/dL — ABNORMAL HIGH (ref 65–99)

## 2012-09-10 LAB — HEMOGLOBIN A1C
Est. average glucose Bld gHb Est-mCnc: 143 mg/dL
Hgb A1c MFr Bld: 6.6 % — ABNORMAL HIGH (ref 4.8–5.6)

## 2012-09-15 ENCOUNTER — Other Ambulatory Visit: Payer: Self-pay | Admitting: *Deleted

## 2012-09-15 MED ORDER — SIMVASTATIN 10 MG PO TABS: ORAL_TABLET | ORAL | Status: DC

## 2012-09-15 MED ORDER — METFORMIN HCL 500 MG PO TABS: ORAL_TABLET | ORAL | Status: DC

## 2012-10-11 ENCOUNTER — Ambulatory Visit: Payer: Self-pay | Admitting: Pharmacotherapy

## 2012-10-25 ENCOUNTER — Ambulatory Visit (INDEPENDENT_AMBULATORY_CARE_PROVIDER_SITE_OTHER): Payer: Federal, State, Local not specified - PPO | Admitting: Pharmacotherapy

## 2012-10-25 ENCOUNTER — Other Ambulatory Visit: Payer: Self-pay | Admitting: Pharmacotherapy

## 2012-10-25 ENCOUNTER — Encounter: Payer: Self-pay | Admitting: Pharmacotherapy

## 2012-10-25 VITALS — BP 128/60 | Temp 97.9°F | Resp 16 | Ht 68.0 in | Wt 286.6 lb

## 2012-10-25 DIAGNOSIS — I10 Essential (primary) hypertension: Secondary | ICD-10-CM

## 2012-10-25 DIAGNOSIS — E119 Type 2 diabetes mellitus without complications: Secondary | ICD-10-CM

## 2012-10-25 NOTE — Patient Instructions (Signed)
Monitor blood glucose daily at alternate times. Increase exercise frequency to 5 times per week.

## 2012-10-25 NOTE — Progress Notes (Signed)
Subjective:    Cheryl [name] is a 57 y.o. female who presents for follow-up of Type 2 diabetes mellitus.  She has been recently diagnosed with DM2 and started on Metformin. Her most recent A1C is 6.6% She has been trying to bring down her BG with diet and exercise. Her brother has DM and has been helping her.  Her father also had DM as well as extended family.  She has changed her diet to reduce the amount of starchy veggies. She loves sweets. 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  She has a blood glucose meter, but doesn't know how to use it. Her PMH significant for HTN, dyslipidemia, and obesity.  She does skip meals at times. Denies problems with feet.  She does have toenail fungus and a hammer toe.  She does see a podiatrist. She is having blurry vision.  Eye exam is overdue. She does have polyuria, polydipsia, and nocturia twice a night.  Review of Systems A comprehensive review of systems was negative except for: Eyes: positive for blurry vision Endocrine: positive for diabetic symptoms including blurry vision, polydipsia, polyuria and nocturia    Objective:    BP 128/60  Temp(Src) 97.9 F (36.6 C)  Resp 16  Ht 5' 8"$  (1.727 m)  Wt 286 lb 9.6 oz (130.001 kg)  BMI 43.59 kg/m2  General:  alert, cooperative, no distress, morbidly obese and appears younger than stated age  Oropharynx: normal findings: lips normal without lesions   Eyes:  negative findings: lids and lashes normal, conjunctivae and sclerae normal and corneas clear   Ears:  external ears are normal        Lung: clear to auscultation bilaterally  Heart:  regular rate and rhythm and S1, S2 normal     Extremities: extremities normal, atraumatic, no cyanosis or edema  Skin: dry     Neuro: mental status, speech normal, alert and oriented x3 and gait and station normal   Lab Review Glucose (mg/dL)  Date Value  09/09/2012  103*     Glucose, Bld (mg/dL)  Date Value  03/08/2012 95   03/18/2009 113*  03/17/2009 148*     CO2 (mmol/L)  Date Value  09/09/2012 26   03/08/2012 28   03/18/2009 27      BUN (mg/dL)  Date Value  09/09/2012 8   03/08/2012 15   03/18/2009 4*  03/17/2009 4*     Creatinine, Ser (mg/dL)  Date Value  09/09/2012 0.80   03/08/2012 0.80   03/18/2009 0.66    09/09/12 - A1C 6.6%  Total cholesterol:  250 HDL:  56 Triglycerides:  104 LDL:  173   Assessment:    Diabetes Mellitus type II, under excellent control.  HTN goal <140/80 Morbid Obesity - 1st goal is to lose 10% of current body weight.   Plan:    1.  Rx changes: none 2.  Continue Metformin.  (SCr 0.8) 3.  Counseled on complications of uncontrolled DM. 4.  Counseled on insulin resistance. 5.  Counseled on meal planning and portion control.  Provided written materials to supplement education. 6.  Counseled on benefit of routine exercise.  Goal is 30-45 minutes 5 x week.  Praised current efforts. 7.  HTN - BP is at goal today.  She does not like to take diuretic when she knows she is going out. 8.  Morbid obesity - extensive counseling on  nutrition and exercise.  She is down 4 pounds. 9.  She is to bring blood glucose meter to each OV.

## 2012-11-05 ENCOUNTER — Other Ambulatory Visit: Payer: Self-pay | Admitting: *Deleted

## 2012-11-05 DIAGNOSIS — I10 Essential (primary) hypertension: Secondary | ICD-10-CM

## 2012-11-05 DIAGNOSIS — IMO0001 Reserved for inherently not codable concepts without codable children: Secondary | ICD-10-CM

## 2012-11-08 ENCOUNTER — Other Ambulatory Visit: Payer: Federal, State, Local not specified - PPO

## 2012-11-08 DIAGNOSIS — IMO0001 Reserved for inherently not codable concepts without codable children: Secondary | ICD-10-CM

## 2012-11-08 DIAGNOSIS — I10 Essential (primary) hypertension: Secondary | ICD-10-CM

## 2012-11-09 ENCOUNTER — Encounter: Payer: Self-pay | Admitting: *Deleted

## 2012-11-09 LAB — BASIC METABOLIC PANEL
BUN/Creatinine Ratio: 9 (ref 9–23)
BUN: 7 mg/dL (ref 6–24)
Chloride: 101 mmol/L (ref 97–108)
Creatinine, Ser: 0.82 mg/dL (ref 0.57–1.00)
GFR calc Af Amer: 91 mL/min/{1.73_m2} (ref >59)
Glucose: 104 mg/dL — ABNORMAL HIGH (ref 65–99)
Potassium: 3.8 mmol/L (ref 3.5–5.2)

## 2012-11-09 LAB — LIPID PANEL
Chol/HDL Ratio: 3.3 ratio units (ref 0.0–4.4)
LDL Calculated: 102 mg/dL — ABNORMAL HIGH (ref 0–99)
Triglycerides: 99 mg/dL (ref 0–149)
VLDL Cholesterol Cal: 20 mg/dL (ref 5–40)

## 2012-11-10 ENCOUNTER — Ambulatory Visit (INDEPENDENT_AMBULATORY_CARE_PROVIDER_SITE_OTHER): Payer: Federal, State, Local not specified - PPO | Admitting: Internal Medicine

## 2012-11-10 ENCOUNTER — Encounter: Payer: Self-pay | Admitting: Internal Medicine

## 2012-11-10 VITALS — BP 112/70 | HR 74 | Temp 98.9°F | Resp 18 | Ht 68.0 in | Wt 277.6 lb

## 2012-11-10 DIAGNOSIS — E785 Hyperlipidemia, unspecified: Secondary | ICD-10-CM

## 2012-11-10 DIAGNOSIS — I10 Essential (primary) hypertension: Secondary | ICD-10-CM

## 2012-11-10 DIAGNOSIS — E119 Type 2 diabetes mellitus without complications: Secondary | ICD-10-CM

## 2012-11-10 DIAGNOSIS — M159 Polyosteoarthritis, unspecified: Secondary | ICD-10-CM

## 2012-11-10 NOTE — Progress Notes (Signed)
Subjective:    Patient ID: Cheryl [name], female    DOB: 10-14-1955, 57 y.o.   MRN: [id number]  HPI  57 y/o pleasant female patient is here for routine follow up visit. She was not checking her blood sugar as she was unable to use her glucometer. She denies any complaints. Compliant with her medications  Review of Systems  Constitutional: Negative for fever, chills, appetite change and fatigue.       Using her bike 6 miles a day for 5 days a week  HENT: Negative for hearing loss, congestion, mouth sores and neck pain.   Eyes: Negative for visual disturbance.  Respiratory: Negative for cough, chest tightness, shortness of breath and wheezing.   Cardiovascular: Negative for chest pain, palpitations and leg swelling.  Gastrointestinal: Negative for nausea, vomiting, abdominal pain, diarrhea, constipation and blood in stool.  Endocrine: Negative for polydipsia and polyphagia.  Genitourinary: Negative for dysuria, frequency and flank pain.  Musculoskeletal: Positive for arthralgias. Negative for back pain and gait problem.  Skin: Negative for pallor and rash.  Neurological: Negative for dizziness, seizures, light-headedness and numbness.  Hematological: Negative for adenopathy.  Psychiatric/Behavioral: Negative for sleep disturbance, decreased concentration and agitation. The patient is not nervous/anxious.        Objective:   Physical Exam  BP 112/70  Pulse 74  Temp(Src) 98.9 F (37.2 C) (Oral)  Resp 18  Ht 5\' 8"  (1.727 m)  Wt 277 lb 9.6 oz (125.919 kg)  BMI 42.22 kg/m2  SpO2 99%  HENT:   Head: Normocephalic and atraumatic.   Mouth/Throat: Oropharynx is clear and moist.  Eyes: Conjunctivae are normal. Pupils are equal, round, and reactive to light.  Neck: Normal range of motion. Neck supple.  Cardiovascular: Normal rate and regular rhythm.   Pulmonary/Chest: Effort normal and breath sounds normal.  Abdominal: Soft. Bowel sounds are normal.  Musculoskeletal: Normal range of  motion. She exhibits no edema and no tenderness.  Lymphadenopathy:    She has no cervical adenopathy.  Neurological: She is alert and oriented to person, place, and time.  Skin: Skin is warm and dry. No rash noted. She is not diaphoretic.  Psychiatric: She has a normal mood and affect. Her behavior is normal.   Lipid Panel     Component Value Date/Time   TRIG 99 11/08/2012 1016   HDL 54 11/08/2012 1016   CHOLHDL 3.3 11/08/2012 1016   LDLCALC 102* 11/08/2012 1016   CMP     Component Value Date/Time   NA 143 11/08/2012 1016   NA 138 03/08/2012 1600   K 3.8 11/08/2012 1016   CL 101 11/08/2012 1016   CO2 25 11/08/2012 1016   GLUCOSE 104* 11/08/2012 1016   GLUCOSE 95 03/08/2012 1600   BUN 7 11/08/2012 1016   BUN 15 03/08/2012 1600   CREATININE 0.82 11/08/2012 1016   CALCIUM 10.3* 11/08/2012 1016   PROT 7.5 03/09/2009 1306   ALBUMIN 4.7 03/09/2009 1306   AST 20 03/09/2009 1306   ALT 16 03/09/2009 1306   ALKPHOS 68 03/09/2009 1306   BILITOT 0.5 03/09/2009 1306   GFRNONAA 79 11/08/2012 1016   GFRAA 91 11/08/2012 1016        Assessment & Plan:   HTN- repeat bp check was normal. continue current medication regimen, check bmp prior to next visit  Allergic rhinitis- symptoms under control. Continue current regimen  DM tpe 2- continue metformin. Monitor cbg. Education of using glucometer provided.recheck a1c prior to next visit. On statin and  ASA  Hyperlipidemia- continue current dose of statin  Osteoarthritis- continue current pain regimen and monitor  Morbid obesity- need to lose weight, to keep bp under control, diet counselled about and encouraged to exercise.

## 2012-12-06 ENCOUNTER — Encounter: Payer: Self-pay | Admitting: Pharmacotherapy

## 2012-12-06 ENCOUNTER — Ambulatory Visit (INDEPENDENT_AMBULATORY_CARE_PROVIDER_SITE_OTHER): Payer: Federal, State, Local not specified - PPO | Admitting: Pharmacotherapy

## 2012-12-06 VITALS — BP 130/80 | HR 73 | Temp 99.2°F | Resp 16 | Ht 68.0 in | Wt 272.2 lb

## 2012-12-06 DIAGNOSIS — E119 Type 2 diabetes mellitus without complications: Secondary | ICD-10-CM

## 2012-12-06 DIAGNOSIS — I10 Essential (primary) hypertension: Secondary | ICD-10-CM

## 2012-12-06 MED ORDER — GLUCOSE BLOOD VI STRP: ORAL_STRIP | Status: DC

## 2012-12-06 MED ORDER — BD LANCET ULTRAFINE 33G MISC: 1.0000 | Status: DC

## 2012-12-06 NOTE — Patient Instructions (Signed)
Stay motivated!! 

## 2012-12-06 NOTE — Progress Notes (Signed)
  Subjective:    Cheryl [name] is a 57 y.o. female who presents for follow-up of Type 2 diabetes mellitus.   A1C is 6.6% Doing well on metformin. Weight is down 5 pounds since last OV (14 pounds overall)  She has been keeping a food diary.  She has been skipping meals at times. She has been trying to make healthy food choices.  She watching portion control.  Logbook shows BG 81-147 (average around 100) She is riding the stationary bike 4 days per week. She feels like she has more energy. Some peripheral edema.  Some numbness & burning in hands and feet - but this has improved. Just had eye exam.  Had new glasses.  Went 11/29/12. Nocturia every night Polydipsia present. Denies polyuria or polyphagia.  Review of Systems A comprehensive review of systems was negative except for: Eyes: positive for contacts/glasses Genitourinary: positive for nocturia Endocrine: positive for diabetic symptoms including polydipsia    Objective:    BP 130/80  Pulse 73  Temp(Src) 99.2 F (37.3 C) (Oral)  Resp 16  Ht 5' 8"$  (1.727 m)  Wt 272 lb 3.2 oz (123.469 kg)  BMI 41.4 kg/m2  SpO2 99%  General:  alert, cooperative, appears stated age, no distress and morbidly obese  Oropharynx: normal findings: lips normal without lesions and gums healthy   Eyes:  negative findings: lids and lashes normal and corneas clear   Ears:  external ears normal        Lung: clear to auscultation bilaterally  Heart:  regular rate and rhythm     Extremities: edema bilateral ankles and feet  Skin: warm and dry, no hyperpigmentation, vitiligo, or suspicious lesions     Neuro: mental status, speech normal, alert and oriented x3 and gait and station normal   Lab Review Glucose (mg/dL)  Date Value  11/08/2012 104*  09/09/2012 103*     Glucose, Bld (mg/dL)  Date Value  03/08/2012 95   03/18/2009 113*  03/17/2009 148*     CO2 (mmol/L)  Date Value  11/08/2012 25   09/09/2012 26   03/08/2012 28      BUN (mg/dL)   Date Value  11/08/2012 7   09/09/2012 8   03/08/2012 15   03/18/2009 4*  03/17/2009 4*     Creatinine, Ser (mg/dL)  Date Value  11/08/2012 0.82   09/09/2012 0.80   03/08/2012 0.80        Assessment:    Diabetes Mellitus type II, under excellent control.  HTN at goal <140/80 Morbid obesity - weight is down 5 more pounds.   Plan:    1.  Rx changes: none 2.  Continue Metformin (SCr 0.82) 3.  Praised exercise efforts.  New goal is to increase frequency to 5 x week. 4.  Counseled on nutrition goals.  Continue to keep food diary. 5.  BP at goal <140/80.  6.  Praised continued weight loss.  Down 14 pounds overall. 7.  Will repeat A1C in 6 weeks.

## 2013-01-11 ENCOUNTER — Other Ambulatory Visit: Payer: Self-pay | Admitting: Geriatric Medicine

## 2013-01-11 DIAGNOSIS — E119 Type 2 diabetes mellitus without complications: Secondary | ICD-10-CM

## 2013-01-11 MED ORDER — BD LANCET ULTRAFINE 33G MISC: 1.0000 | Status: DC

## 2013-01-11 MED ORDER — GLUCOSE BLOOD VI STRP: ORAL_STRIP | Status: DC

## 2013-01-13 ENCOUNTER — Other Ambulatory Visit: Payer: Federal, State, Local not specified - PPO

## 2013-01-13 DIAGNOSIS — E119 Type 2 diabetes mellitus without complications: Secondary | ICD-10-CM

## 2013-01-14 LAB — COMPREHENSIVE METABOLIC PANEL
ALT: 12 IU/L (ref 0–32)
AST: 16 IU/L (ref 0–40)
Albumin/Globulin Ratio: 1.7 (ref 1.1–2.5)
Albumin: 4.5 g/dL (ref 3.5–5.5)
Alkaline Phosphatase: 62 IU/L (ref 39–117)
BUN/Creatinine Ratio: 16 (ref 9–23)
BUN: 12 mg/dL (ref 6–24)
CO2: 26 mmol/L (ref 18–29)
Calcium: 9.7 mg/dL (ref 8.7–10.2)
Chloride: 100 mmol/L (ref 97–108)
Creatinine, Ser: 0.76 mg/dL (ref 0.57–1.00)
GFR calc Af Amer: 99 mL/min/{1.73_m2} (ref >59)
GFR calc non Af Amer: 86 mL/min/{1.73_m2} (ref >59)
Globulin, Total: 2.6 g/dL (ref 1.5–4.5)
Glucose: 94 mg/dL (ref 65–99)
Potassium: 3.8 mmol/L (ref 3.5–5.2)
Sodium: 143 mmol/L (ref 134–144)
Total Bilirubin: 0.6 mg/dL (ref 0.0–1.2)
Total Protein: 7.1 g/dL (ref 6.0–8.5)

## 2013-01-14 LAB — HEMOGLOBIN A1C
Est. average glucose Bld gHb Est-mCnc: 131 mg/dL
Hgb A1c MFr Bld: 6.2 % — ABNORMAL HIGH (ref 4.8–5.6)

## 2013-01-14 LAB — MICROALBUMIN / CREATININE URINE RATIO
Creatinine, Ur: 139 mg/dL (ref 15.0–278.0)
MICROALB/CREAT RATIO: 7.7 mg/g creat (ref 0.0–30.0)
Microalbumin, Urine: 10.7 ug/mL (ref 0.0–17.0)

## 2013-01-17 ENCOUNTER — Ambulatory Visit (INDEPENDENT_AMBULATORY_CARE_PROVIDER_SITE_OTHER): Payer: Federal, State, Local not specified - PPO | Admitting: Pharmacotherapy

## 2013-01-17 ENCOUNTER — Encounter: Payer: Self-pay | Admitting: Pharmacotherapy

## 2013-01-17 VITALS — BP 144/82 | HR 51 | Temp 98.4°F | Ht 68.0 in | Wt 266.0 lb

## 2013-01-17 DIAGNOSIS — I10 Essential (primary) hypertension: Secondary | ICD-10-CM

## 2013-01-17 DIAGNOSIS — E119 Type 2 diabetes mellitus without complications: Secondary | ICD-10-CM

## 2013-01-17 DIAGNOSIS — E785 Hyperlipidemia, unspecified: Secondary | ICD-10-CM

## 2013-01-17 MED ORDER — ACCU-CHEK AVIVA VI STRP: ORAL_STRIP | Status: DC

## 2013-01-17 NOTE — Patient Instructions (Signed)
Stay motivated!!

## 2013-01-17 NOTE — Progress Notes (Signed)
  Subjective:    Cheryl [name] is a 57 y.o. female who presents for follow-up of Type 2 diabetes mellitus.   Her A1C is now 6.2%. BG:  76-216 (average 120) No hypoglycemia.  Not walking as much. She continues to eat healthy choices, not skipping meals. Has more energy.  Feels better. Denies problems with feet. Still has blurry vision.  Does have cataracts.  Just went to eye doctor.  Cataracts not ready to come out yet, but did require new glasses. Nocturia most nights. Polydipsia still present. Denies peripheral edema.  Review of Systems Eyes: positive for cataracts Genitourinary:positive for nocturia Endocrine: positive for diabetic symptoms including blurry vision and polydipsia    Objective:    There were no vitals taken for this visit.  General:  alert, cooperative, no distress and moderately obese  Oropharynx: normal findings: lips normal without lesions and gums healthy   Eyes:  negative findings: lids and lashes normal and corneas clear   Ears:  external ears normal        Lung: clear to auscultation bilaterally  Heart:  regular rate and rhythm     Extremities: edema (trace bilateral lower extremities)  Skin: warm and dry, no hyperpigmentation, vitiligo, or suspicious lesions     Neuro: normal without focal findings, mental status, speech normal, alert and oriented x3 and gait and station normal   Lab Review Glucose (mg/dL)  Date Value  01/13/2013 94   11/08/2012 104*  09/09/2012 103*     Glucose, Bld (mg/dL)  Date Value  03/08/2012 95   03/18/2009 113*  03/17/2009 148*     CO2 (mmol/L)  Date Value  01/13/2013 26   11/08/2012 25   09/09/2012 26      BUN (mg/dL)  Date Value  01/13/2013 12   11/08/2012 7   09/09/2012 8   03/08/2012 15   03/18/2009 4*  03/17/2009 4*     Creatinine, Ser (mg/dL)  Date Value  01/13/2013 0.76   11/08/2012 0.82   09/09/2012 0.80     01/13/13:  A1C:  6.2% AST:  16 ALT:  12 Microalbumin:  10.7  Assessment:     Diabetes Mellitus type II, under excellent control.  BP above goal <140/80 LDL goal <100 Continues to lose weight in a healthy way.   Plan:    1.  Rx changes: none 2.  Continue metformin (SCr 0.76). 3.  Praised weight loss. 4.  Counseled on nutrition goals. 5.  Exercise goal - 30-45 minutes 5 x week. 6.  HTN above goal today; however, home checks are at goal <140/80.  Will continue triamterene/HCTZ and monitor. 7.  Continue simvastatin.  Will check FLP prior to next OV.

## 2013-02-03 ENCOUNTER — Other Ambulatory Visit: Payer: Federal, State, Local not specified - PPO

## 2013-02-03 DIAGNOSIS — E119 Type 2 diabetes mellitus without complications: Secondary | ICD-10-CM

## 2013-02-03 DIAGNOSIS — E785 Hyperlipidemia, unspecified: Secondary | ICD-10-CM

## 2013-02-04 ENCOUNTER — Other Ambulatory Visit: Payer: Federal, State, Local not specified - PPO

## 2013-02-04 LAB — COMPREHENSIVE METABOLIC PANEL
ALT: 16 IU/L (ref 0–32)
AST: 19 IU/L (ref 0–40)
Albumin/Globulin Ratio: 2.1 (ref 1.1–2.5)
Albumin: 4.7 g/dL (ref 3.5–5.5)
Alkaline Phosphatase: 64 IU/L (ref 39–117)
BUN/Creatinine Ratio: 13 (ref 9–23)
BUN: 10 mg/dL (ref 6–24)
CO2: 26 mmol/L (ref 18–29)
Calcium: 9.7 mg/dL (ref 8.7–10.2)
Chloride: 100 mmol/L (ref 97–108)
Creatinine, Ser: 0.78 mg/dL (ref 0.57–1.00)
GFR calc Af Amer: 96 mL/min/{1.73_m2} (ref >59)
GFR calc non Af Amer: 83 mL/min/{1.73_m2} (ref >59)
Globulin, Total: 2.2 g/dL (ref 1.5–4.5)
Glucose: 91 mg/dL (ref 65–99)
Potassium: 3.9 mmol/L (ref 3.5–5.2)
Sodium: 143 mmol/L (ref 134–144)
Total Bilirubin: 0.5 mg/dL (ref 0.0–1.2)
Total Protein: 6.9 g/dL (ref 6.0–8.5)

## 2013-02-04 LAB — LIPID PANEL
Chol/HDL Ratio: 2.9 ratio units (ref 0.0–4.4)
Cholesterol, Total: 174 mg/dL (ref 100–199)
HDL: 61 mg/dL (ref >39)
LDL Calculated: 93 mg/dL (ref 0–99)
Triglycerides: 100 mg/dL (ref 0–149)
VLDL Cholesterol Cal: 20 mg/dL (ref 5–40)

## 2013-02-04 LAB — HEMOGLOBIN A1C
Est. average glucose Bld gHb Est-mCnc: 128 mg/dL
Hgb A1c MFr Bld: 6.1 % — ABNORMAL HIGH (ref 4.8–5.6)

## 2013-02-08 ENCOUNTER — Encounter: Payer: Self-pay | Admitting: Internal Medicine

## 2013-02-08 ENCOUNTER — Ambulatory Visit (INDEPENDENT_AMBULATORY_CARE_PROVIDER_SITE_OTHER): Payer: Federal, State, Local not specified - PPO | Admitting: Internal Medicine

## 2013-02-08 VITALS — BP 136/78 | HR 64 | Wt 263.0 lb

## 2013-02-08 DIAGNOSIS — M159 Polyosteoarthritis, unspecified: Secondary | ICD-10-CM

## 2013-02-08 DIAGNOSIS — E785 Hyperlipidemia, unspecified: Secondary | ICD-10-CM

## 2013-02-08 DIAGNOSIS — E119 Type 2 diabetes mellitus without complications: Secondary | ICD-10-CM

## 2013-02-08 DIAGNOSIS — K219 Gastro-esophageal reflux disease without esophagitis: Secondary | ICD-10-CM | POA: Insufficient documentation

## 2013-02-08 DIAGNOSIS — Z23 Encounter for immunization: Secondary | ICD-10-CM

## 2013-02-08 DIAGNOSIS — J309 Allergic rhinitis, unspecified: Secondary | ICD-10-CM

## 2013-02-08 MED ORDER — BD LANCET ULTRAFINE 33G MISC: 1.0000 | Status: DC

## 2013-02-08 MED ORDER — METFORMIN HCL 500 MG PO TABS: ORAL_TABLET | ORAL | Status: DC

## 2013-02-08 MED ORDER — OMEPRAZOLE 10 MG PO CPDR: 10.0000 mg | DELAYED_RELEASE_CAPSULE | Freq: Every day | ORAL | Status: DC

## 2013-02-08 NOTE — Progress Notes (Signed)
Patient ID: Cheryl [name], female   DOB: Dec 16, 1955, 57 y.o.   MRN: [id number]  Chief Complaint  Patient presents with  . Medical Managment of Chronic Issues    3 Month follow-up    Allergies  Allergen Reactions  . Oxycodone     Had stomach and headache as side effect from medicine  . Sulfur Diarrhea    HPI 57 y/o pleasant female patient is here for routine follow up visit.  She has been riding her stationary bike for 45 minutes a day on routine basis 5 times a week. She has lost weight since I saw her last 3 months back. She is down to 263 lbs from 277 lbs. cbg has been between 80-120. Denies any hypoglycemia symptoms.Compliant with her medications. Denies any concerns. Denies polyuria and polydypsia  Reviewed recent a1c is 6.1 from 6.6. On metformin 500 mg once day  Review of Systems  Constitutional: Negative for fever, chills, appetite change and fatigue.   HENT: Negative for hearing loss, congestion, mouth sores and neck pain.   Eyes: Negative for visual disturbance. She does have occassional blurry vision Respiratory: Negative for cough, chest tightness, shortness of breath and wheezing.   Cardiovascular: Negative for chest pain, palpitations and leg swelling.  Gastrointestinal: Negative for nausea, vomiting, abdominal pain, diarrhea, constipation and blood in stool. Being more careful with her diet. Tries to eat more of fruits and vegetable Endocrine: Negative for polydipsia and polyphagia.  Genitourinary: Negative for dysuria, frequency and flank pain.  Musculoskeletal: Positive for arthralgias. Negative for back pain and gait problem.  Skin: Negative for pallor and rash.  Neurological: Negative for dizziness, seizures, light-headedness and numbness.  Hematological: Negative for adenopathy.  Psychiatric/Behavioral: Negative for sleep disturbance, decreased concentration and agitation. The patient is not nervous/anxious.    Past Medical History  Diagnosis Date  . Hypertension    . Diabetes mellitus without complication     diet- controlled  . Asthma   . GERD (gastroesophageal reflux disease)   . Arthritis   . Impaired fasting glucose   . Morbid obesity   . Carpal tunnel syndrome   . Other specified cardiac dysrhythmias(427.89)   . Symptomatic menopausal or female climacteric states   . Allergic rhinitis, cause unspecified   . Pain in joint, site unspecified   . Other malaise and fatigue    Past Surgical History  Procedure Laterality Date  . Carpal tunnel both hands      2012  . Joint replacement      both knees replacement, 2010  . Abdominal hysterectomy      1994  . Nodule removed from back    . Mole removed from face    . Tonsillectomy      as teenager   Current Outpatient Prescriptions on File Prior to Visit  Medication Sig Dispense Refill  . ACCU-CHEK AVIVA test strip Use as instructed  100 each  12  . Ascorbic Acid (VITAMIN C) 100 MG tablet Take 1,000 mg by mouth daily.      Marland Kitchen aspirin 81 MG tablet Take 81 mg by mouth daily.      Marland Kitchen azelastine (ASTELIN) 137 MCG/SPRAY nasal spray Place 1 spray into the nose 2 (two) times daily. Use in each nostril as directed      . B-D ULTRA-FINE 33 LANCETS MISC 1 each by Does not apply route as directed.  100 each  6  . beclomethasone (QVAR) 80 MCG/ACT inhaler Inhale 1 puff into the lungs as needed.      Marland Kitchen  Biotin 1000 MCG tablet Take one tablet once daily      . Calcium Carbonate-Vitamin D (CALTRATE 600+D) 600-400 MG-UNIT per chew tablet Chew 1 tablet by mouth daily. Chew one tablet once a day      . EPIPEN 2-PAK 0.3 MG/0.3ML SOAJ injection       . fexofenadine (ALLEGRA) 180 MG tablet Take 180 mg by mouth daily.      . fluticasone (VERAMYST) 27.5 MCG/SPRAY nasal spray Place 2 sprays into the nose 2 (two) times daily.      Marland Kitchen loratadine (ALAVERT) 10 MG tablet Take 10 mg by mouth daily.      . metFORMIN (GLUCOPHAGE) 500 MG tablet Take one tablet once daily to control blood sugar  90 tablet  3  . Multiple Vitamin  (MULTIVITAMIN) tablet Take 1 tablet by mouth daily. Take one tablet once a day      . omeprazole (PRILOSEC) 20 MG capsule Take 20 mg by mouth daily.      . simvastatin (ZOCOR) 10 MG tablet Take one tablet once daily for cholesterol  90 tablet  3  . traMADol (ULTRAM) 50 MG tablet Take 50 mg by mouth every 8 (eight) hours as needed for pain (take one tab every 8-12 hour as needed for pain).      . triamterene-hydrochlorothiazide (DYAZIDE) 50-25 MG per capsule Take 1 capsule by mouth every morning.      . vitamin E (VITAMIN E) 400 UNIT capsule Take 400 Units by mouth daily.       No current facility-administered medications on file prior to visit.    Physical exam  BP 136/78  Pulse 64  Wt 263 lb (119.296 kg)  BMI 40 kg/m2  HENT:   Head: Normocephalic and atraumatic.   Mouth/Throat: Oropharynx is clear and moist.   Eyes: Conjunctivae are normal. Pupils are equal, round, and reactive to light.   Neck: Normal range of motion. Neck supple.   Cardiovascular: Normal rate and regular rhythm.    Pulmonary/Chest: Effort normal and breath sounds normal.   Abdominal: Soft. Bowel sounds are normal.  Musculoskeletal: Normal range of motion. She exhibits no edema and no tenderness.  Lymphadenopathy:    She has no cervical adenopathy.  Neurological: She is alert and oriented to person, place, and time.   Skin: Skin is warm and dry. No rash noted. She is not diaphoretic.  Psychiatric: She has a normal mood and affect. Her behavior is normal.   Labs- 02/03/13 a1c 6.1  CMP     Component Value Date/Time   NA 143 02/03/2013 1003   NA 138 03/08/2012 1600   K 3.9 02/03/2013 1003   CL 100 02/03/2013 1003   CO2 26 02/03/2013 1003   GLUCOSE 91 02/03/2013 1003   GLUCOSE 95 03/08/2012 1600   BUN 10 02/03/2013 1003   BUN 15 03/08/2012 1600   CREATININE 0.78 02/03/2013 1003   CALCIUM 9.7 02/03/2013 1003   PROT 6.9 02/03/2013 1003   PROT 7.5 03/09/2009 1306   ALBUMIN 4.7 03/09/2009 1306   AST 19 02/03/2013  1003   ALT 16 02/03/2013 1003   ALKPHOS 64 02/03/2013 1003   BILITOT 0.5 02/03/2013 1003   GFRNONAA 83 02/03/2013 1003   GFRAA 96 02/03/2013 1003   Lipid Panel     Component Value Date/Time   TRIG 100 02/03/2013 1003   HDL 61 02/03/2013 1003   CHOLHDL 2.9 02/03/2013 1003   LDLCALC 93 02/03/2013 1003   Assessment/plan  Dm type 2-  well controlled. Will change her metformin to 500 mg every other day. Monitor cbg 2-3 times a week. Reviewed bmp and lipid panel. ldl is < 100. bp under control. Continue baby aspirin  HTN- normal bp reading. continue current medication regimen of dyazide, reviewed bmp  Allergic rhinitis- symptoms under control. Continue current regimen of nasal spray and qvar. Also continue allegra and loratidine  Hyperlipidemia- continue current dose of zocor  Osteoarthritis- continue current pain regimen of tramadoland monitor. Continue ca-vit d  Morbid obesity- need to lose weight, to keep bp under control, diet counselled about and encouraged to exercise.  GERD- much improved. Will decrease her prilosec to 10 mg daily for now. Congratulated on weight loss and diet control. Can consider discontinuing medication next visit

## 2013-03-25 ENCOUNTER — Encounter: Payer: Self-pay | Admitting: Internal Medicine

## 2013-05-03 ENCOUNTER — Encounter: Payer: Self-pay | Admitting: Internal Medicine

## 2013-05-03 ENCOUNTER — Ambulatory Visit (INDEPENDENT_AMBULATORY_CARE_PROVIDER_SITE_OTHER): Payer: Federal, State, Local not specified - PPO | Admitting: Internal Medicine

## 2013-05-03 VITALS — BP 122/70 | HR 68 | Temp 98.1°F | Resp 14 | Wt 263.2 lb

## 2013-05-03 DIAGNOSIS — Z23 Encounter for immunization: Secondary | ICD-10-CM

## 2013-05-03 DIAGNOSIS — I8310 Varicose veins of unspecified lower extremity with inflammation: Secondary | ICD-10-CM | POA: Insufficient documentation

## 2013-05-03 DIAGNOSIS — I8311 Varicose veins of right lower extremity with inflammation: Secondary | ICD-10-CM

## 2013-05-03 DIAGNOSIS — I10 Essential (primary) hypertension: Secondary | ICD-10-CM

## 2013-05-03 NOTE — Progress Notes (Signed)
Patient ID: Cheryl [name], female   DOB: October 24, 1955, 57 y.o.   MRN: [id number]     Chief Complaint  Patient presents with  . Acute Visit    knot in right leg with pain   Allergies  Allergen Reactions  . Oxycodone     Had stomach and headache as side effect from medicine  . Sulfur Diarrhea   HPI 57 /o female patient is here for acute visit. She has noticed a small knot in her right inner leg area for 1-2 months and she has noticed it to be painful for last 1 week. Denies any fever or chills. It is intermittent discomfort with walking and while pressing on it. No drainage, redness noted. Denies any trauma. Has hx of htn, dm, obesity. Sugar has been controlled at home and compliant with her meds Has been doing cycling for her exercise  Review of Systems  Constitutional: Negative for fever, chills, weight loss, malaise/fatigue and diaphoresis.  HENT: Negative for congestion, hearing loss and sore throat.   Eyes: Negative for blurred vision, double vision and discharge.  Respiratory: Negative for cough, sputum production, shortness of breath and wheezing.   Cardiovascular: Negative for chest pain, palpitations, orthopnea and leg swelling.  Gastrointestinal: Negative for heartburn, nausea, vomiting, abdominal pain, diarrhea and constipation.  Musculoskeletal: Negative for back pain, falls, joint pain and myalgias.  Skin: Negative for itching and rash.  Neurological: Negative for dizziness, tingling, focal weakness and headaches.  Psychiatric/Behavioral: Negative for depression and memory loss. The patient is not nervous/anxious.    Past Medical History  Diagnosis Date  . Hypertension   . Diabetes mellitus without complication     diet- controlled  . Asthma   . GERD (gastroesophageal reflux disease)   . Arthritis   . Impaired fasting glucose   . Morbid obesity   . Carpal tunnel syndrome   . Other specified cardiac dysrhythmias(427.89)   . Symptomatic menopausal or female climacteric  states   . Allergic rhinitis, cause unspecified   . Pain in joint, site unspecified   . Other malaise and fatigue    Past Surgical History  Procedure Laterality Date  . Carpal tunnel both hands      2012  . Joint replacement      both knees replacement, 2010  . Abdominal hysterectomy      1994  . Nodule removed from back    . Mole removed from face    . Tonsillectomy      as teenager   Current Outpatient Prescriptions on File Prior to Visit  Medication Sig Dispense Refill  . ACCU-CHEK AVIVA test strip Use as instructed  100 each  12  . AMBULATORY NON FORMULARY MEDICATION Medication Name: Allergy Injection- once weekly      . Ascorbic Acid (VITAMIN C) 100 MG tablet Take 1,000 mg by mouth daily.      Marland Kitchen aspirin 81 MG tablet Take 81 mg by mouth daily.      Marland Kitchen azelastine (ASTELIN) 137 MCG/SPRAY nasal spray Place 1 spray into the nose 2 (two) times daily. Use in each nostril as directed      . B-D ULTRA-FINE 33 LANCETS MISC 1 each by Does not apply route as directed.  100 each  6  . beclomethasone (QVAR) 80 MCG/ACT inhaler Inhale 1 puff into the lungs as needed.      . Biotin 1000 MCG tablet Take one tablet once daily      . Calcium Carbonate-Vitamin D (CALTRATE 600+D)  600-400 MG-UNIT per chew tablet Chew 1 tablet by mouth daily. Chew one tablet once a day      . EPIPEN 2-PAK 0.3 MG/0.3ML SOAJ injection       . fexofenadine (ALLEGRA) 180 MG tablet Take 180 mg by mouth daily.      . fluticasone (VERAMYST) 27.5 MCG/SPRAY nasal spray Place 2 sprays into the nose 2 (two) times daily.      Marland Kitchen loratadine (ALAVERT) 10 MG tablet Take 10 mg by mouth daily.      . metFORMIN (GLUCOPHAGE) 500 MG tablet Take one tablet every other day to control blood sugar  45 tablet  3  . Multiple Vitamin (MULTIVITAMIN) tablet Take 1 tablet by mouth daily. Take one tablet once a day      . omeprazole (PRILOSEC) 10 MG capsule Take 1 capsule (10 mg total) by mouth daily.  30 capsule  3  . simvastatin (ZOCOR) 10 MG  tablet Take one tablet once daily for cholesterol  90 tablet  3  . traMADol (ULTRAM) 50 MG tablet Take 50 mg by mouth every 8 (eight) hours as needed for pain (take one tab every 8-12 hour as needed for pain).      . triamterene-hydrochlorothiazide (DYAZIDE) 50-25 MG per capsule Take 1 capsule by mouth every morning.      . vitamin E (VITAMIN E) 400 UNIT capsule Take 400 Units by mouth daily.       No current facility-administered medications on file prior to visit.    Physical exam BP 122/70  Pulse 68  Temp(Src) 98.1 F (36.7 C) (Oral)  Resp 14  Wt 263 lb 3.2 oz (119.387 kg)  SpO2 99%  HENT:   Head: Normocephalic and atraumatic.   Mouth/Throat: Oropharynx is clear and moist.   Eyes: Conjunctivae are normal. Pupils are equal, round, and reactive to light.   Neck: Normal range of motion. Neck supple.   Cardiovascular: Normal rate and regular rhythm.    Pulmonary/Chest: Effort normal and breath sounds normal.   Abdominal: Soft. Bowel sounds are normal.  Musculoskeletal: Normal range of motion. She exhibits no edema. Has a knot in her right leg which is tender to touch and feels like a clump of veins. She also has few other tortuous veins and knots in her right leg. No open skin area. No drainage Lymphadenopathy:    She has no cervical adenopathy.  Neurological: She is alert and oriented to person, place, and time.   Skin: Skin is warm and dry. No rash noted. She is not diaphoretic.  Psychiatric: She has a normal mood and affect. Her behavior is normal.   Labs- Lab Results  Component Value Date   HGBA1C 6.1* 02/03/2013    Assessment/plan  1. 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 No open ulcer for now. To take ibuprofen prn for pain/ inflammation. Also to keep legs elevated at rest and reviewed ankle flexion exercise. Continue baby aspirin  2. Unspecified essential hypertension bp well controlled  on current regimen of dyazide  3. Morbid obesity Exercise encouraged  4. Need for prophylactic vaccination against Streptococcus pneumoniae (pneumococcus) - Pneumococcal polysaccharide vaccine 23-valent greater than or equal to 2yo subcutaneous/IM

## 2013-05-23 ENCOUNTER — Other Ambulatory Visit: Payer: Self-pay | Admitting: *Deleted

## 2013-05-23 DIAGNOSIS — E119 Type 2 diabetes mellitus without complications: Secondary | ICD-10-CM

## 2013-05-23 MED ORDER — ACCU-CHEK AVIVA VI STRP: ORAL_STRIP | Status: DC

## 2013-05-31 ENCOUNTER — Other Ambulatory Visit: Payer: Self-pay | Admitting: Internal Medicine

## 2013-06-09 ENCOUNTER — Other Ambulatory Visit: Payer: Federal, State, Local not specified - PPO

## 2013-06-09 DIAGNOSIS — E119 Type 2 diabetes mellitus without complications: Secondary | ICD-10-CM

## 2013-06-10 LAB — CBC WITH DIFFERENTIAL/PLATELET
BASOS: 0 %
Basophils Absolute: 0 10*3/uL (ref 0.0–0.2)
EOS ABS: 0.3 10*3/uL (ref 0.0–0.4)
Eos: 4 %
HCT: 40.8 % (ref 34.0–46.6)
Hemoglobin: 13.6 g/dL (ref 11.1–15.9)
IMMATURE GRANS (ABS): 0 10*3/uL (ref 0.0–0.1)
Immature Granulocytes: 0 %
Lymphocytes Absolute: 2 10*3/uL (ref 0.7–3.1)
Lymphs: 28 %
MCH: 27.4 pg (ref 26.6–33.0)
MCHC: 33.3 g/dL (ref 31.5–35.7)
MCV: 82 fL (ref 79–97)
MONOS ABS: 0.5 10*3/uL (ref 0.1–0.9)
Monocytes: 7 %
NEUTROS ABS: 4.4 10*3/uL (ref 1.4–7.0)
NEUTROS PCT: 61 %
RBC: 4.96 x10E6/uL (ref 3.77–5.28)
RDW: 15 % (ref 12.3–15.4)
WBC: 7.2 10*3/uL (ref 3.4–10.8)

## 2013-06-10 LAB — BASIC METABOLIC PANEL
BUN/Creatinine Ratio: 13 (ref 9–23)
BUN: 11 mg/dL (ref 6–24)
CALCIUM: 9.9 mg/dL (ref 8.7–10.2)
CO2: 24 mmol/L (ref 18–29)
CREATININE: 0.83 mg/dL (ref 0.57–1.00)
Chloride: 101 mmol/L (ref 97–108)
GFR calc Af Amer: 89 mL/min/{1.73_m2} (ref >59)
GFR, EST NON AFRICAN AMERICAN: 77 mL/min/{1.73_m2} (ref >59)
GLUCOSE: 100 mg/dL — AB (ref 65–99)
POTASSIUM: 4 mmol/L (ref 3.5–5.2)
Sodium: 143 mmol/L (ref 134–144)

## 2013-06-10 LAB — HEMOGLOBIN A1C
Est. average glucose Bld gHb Est-mCnc: 128 mg/dL
Hgb A1c MFr Bld: 6.1 % — ABNORMAL HIGH (ref 4.8–5.6)

## 2013-06-14 ENCOUNTER — Encounter: Payer: Self-pay | Admitting: Internal Medicine

## 2013-06-14 ENCOUNTER — Ambulatory Visit (INDEPENDENT_AMBULATORY_CARE_PROVIDER_SITE_OTHER): Payer: Federal, State, Local not specified - PPO | Admitting: Internal Medicine

## 2013-06-14 VITALS — BP 126/78 | HR 68 | Temp 98.2°F | Wt 269.6 lb

## 2013-06-14 DIAGNOSIS — E119 Type 2 diabetes mellitus without complications: Secondary | ICD-10-CM

## 2013-06-14 DIAGNOSIS — J309 Allergic rhinitis, unspecified: Secondary | ICD-10-CM

## 2013-06-14 DIAGNOSIS — M159 Polyosteoarthritis, unspecified: Secondary | ICD-10-CM

## 2013-06-14 DIAGNOSIS — I10 Essential (primary) hypertension: Secondary | ICD-10-CM

## 2013-06-14 DIAGNOSIS — E785 Hyperlipidemia, unspecified: Secondary | ICD-10-CM

## 2013-06-14 DIAGNOSIS — K219 Gastro-esophageal reflux disease without esophagitis: Secondary | ICD-10-CM

## 2013-06-14 NOTE — Progress Notes (Signed)
Patient ID: Cheryl [name], female   DOB: Feb 02, 1956, 58 y.o.   MRN: [id number]    Chief Complaint  Patient presents with  . Medical Managment of Chronic Issues    htn, knee pain, dm   Allergies  Allergen Reactions  . Oxycodone     Had stomach and headache as side effect from medicine  . Sulfur Diarrhea   HPI 58 y/o female pt seen for RV. She denies any complaints cbg yesterday 98 and average 80-90. Reviewed her labs Denies any hypoglycemia symptoms.Compliant with her medications.   Review of Systems   Constitutional: Negative for fever, chills, appetite change and fatigue.   HENT: Negative for hearing loss, congestion, mouth sores and neck pain.    Eyes: Negative for visual disturbance. She does have occassional blurry vision Respiratory: Negative for cough, chest tightness, shortness of breath and wheezing.    Cardiovascular: Negative for chest pain, palpitations and leg swelling.   Gastrointestinal: Negative for nausea, vomiting, abdominal pain, diarrhea, constipation and blood in stool. Being more careful with her diet. Tries to eat more of fruits and vegetable Endocrine: Negative for polydipsia and polyphagia.   Genitourinary: Negative for dysuria, frequency and flank pain.   Musculoskeletal: Positive for arthralgias. Negative for back pain and gait problem.   Skin: Negative for pallor and rash.   Neurological: Negative for dizziness, seizures, light-headedness and numbness.   Hematological: Negative for adenopathy.   Psychiatric/Behavioral: Negative for sleep disturbance, decreased concentration and agitation. The patient is not nervous/anxious.    Physical exam BP 126/78  Pulse 68  Temp(Src) 98.2 F (36.8 C) (Oral)  Wt 269 lb 9.6 oz (122.29 kg)  SpO2 99%  HENT:   Head: Normocephalic and atraumatic.   Mouth/Throat: Oropharynx is clear and moist.   Eyes: Conjunctivae are normal. Pupils are equal, round, and reactive to light.   Neck: Normal range of motion. Neck supple.    Cardiovascular: Normal rate and regular rhythm.    Pulmonary/Chest: Effort normal and breath sounds normal.   Abdominal: Soft. Bowel sounds are normal.  Musculoskeletal: Normal range of motion. She exhibits no edema and no tenderness.  Lymphadenopathy:    She has no cervical adenopathy.  Neurological: She is alert and oriented to person, place, and time.   Skin: Skin is warm and dry. No rash noted. She is not diaphoretic.  Psychiatric: She has a normal mood and affect. Her behavior is normal.   Lab Results  Component Value Date   HGBA1C 6.1* 06/09/2013   CMP     Component Value Date/Time   NA 143 06/09/2013 0959   NA 138 03/08/2012 1600   K 4.0 06/09/2013 0959   CL 101 06/09/2013 0959   CO2 24 06/09/2013 0959   GLUCOSE 100* 06/09/2013 0959   GLUCOSE 95 03/08/2012 1600   BUN 11 06/09/2013 0959   BUN 15 03/08/2012 1600   CREATININE 0.83 06/09/2013 0959   CALCIUM 9.9 06/09/2013 0959   PROT 6.9 02/03/2013 1003   PROT 7.5 03/09/2009 1306   ALBUMIN 4.7 03/09/2009 1306   AST 19 02/03/2013 1003   ALT 16 02/03/2013 1003   ALKPHOS 64 02/03/2013 1003   BILITOT 0.5 02/03/2013 1003   GFRNONAA 77 06/09/2013 0959   GFRAA 89 06/09/2013 0959    Assessment/plan  1. Allergic rhinitis, cause unspecified Stable, continue current regimen, no changes made  2. GERD (gastroesophageal reflux disease) Symptom controlled with omeprazole for now. Weight loss encouraged  3. Type II or unspecified type diabetes mellitus  without mention of complication, not stated as uncontrolled Advised to stop metformin with a1c well controlled. Pt not willing to stop at present. Reassess in 3 months. Warned about hypoglycemia - Hemoglobin A1c; Future - CMP; Future  4. Generalized osteoarthrosis, involving multiple sites Continue tramadol 50 mg q8-12 hr prn and this has been helping her. Continue exercise  5. Morbid obesity Weight loss, dietary changes encouraged. Pt has been having some imporvement  6. Other and  unspecified hyperlipidemia Continue zocor, no side effect reported - Lipid Panel; Future  7. Unspecified essential hypertension Stable, continue her dyazide for now

## 2013-09-03 ENCOUNTER — Other Ambulatory Visit: Payer: Self-pay | Admitting: Internal Medicine

## 2013-09-12 ENCOUNTER — Other Ambulatory Visit: Payer: Self-pay | Admitting: Internal Medicine

## 2013-09-20 ENCOUNTER — Other Ambulatory Visit: Payer: Self-pay | Admitting: Orthopedic Surgery

## 2013-09-20 DIAGNOSIS — M25512 Pain in left shoulder: Secondary | ICD-10-CM

## 2013-09-25 ENCOUNTER — Other Ambulatory Visit (HOSPITAL_BASED_OUTPATIENT_CLINIC_OR_DEPARTMENT_OTHER): Payer: Self-pay | Admitting: Internal Medicine

## 2013-09-26 ENCOUNTER — Other Ambulatory Visit: Payer: Federal, State, Local not specified - PPO

## 2013-09-26 NOTE — Telephone Encounter (Signed)
Spoke with patient, patient confirmed that she is taking 75-50. RX sent back to the pharmacy

## 2013-10-03 ENCOUNTER — Ambulatory Visit
Admission: RE | Admit: 2013-10-03 | Discharge: 2013-10-03 | Disposition: A | Discharge status: Home / Self Care | Payer: Federal, State, Local not specified - PPO | Source: Ambulatory Visit | Attending: Orthopedic Surgery | Admitting: Orthopedic Surgery

## 2013-10-03 DIAGNOSIS — M25512 Pain in left shoulder: Secondary | ICD-10-CM

## 2013-10-06 ENCOUNTER — Other Ambulatory Visit: Payer: Federal, State, Local not specified - PPO

## 2013-10-06 DIAGNOSIS — E785 Hyperlipidemia, unspecified: Secondary | ICD-10-CM

## 2013-10-06 DIAGNOSIS — E119 Type 2 diabetes mellitus without complications: Secondary | ICD-10-CM

## 2013-10-07 LAB — COMPREHENSIVE METABOLIC PANEL
A/G RATIO: 2 (ref 1.1–2.5)
ALBUMIN: 4.7 g/dL (ref 3.5–5.5)
ALK PHOS: 66 IU/L (ref 39–117)
ALT: 9 IU/L (ref 0–32)
AST: 14 IU/L (ref 0–40)
BILIRUBIN TOTAL: 0.5 mg/dL (ref 0.0–1.2)
BUN/Creatinine Ratio: 15 (ref 9–23)
BUN: 12 mg/dL (ref 6–24)
CO2: 25 mmol/L (ref 18–29)
CREATININE: 0.82 mg/dL (ref 0.57–1.00)
Calcium: 10 mg/dL (ref 8.7–10.2)
Chloride: 100 mmol/L (ref 97–108)
GFR, EST AFRICAN AMERICAN: 91 mL/min/{1.73_m2} (ref >59)
GFR, EST NON AFRICAN AMERICAN: 79 mL/min/{1.73_m2} (ref >59)
GLOBULIN, TOTAL: 2.4 g/dL (ref 1.5–4.5)
Glucose: 96 mg/dL (ref 65–99)
Potassium: 4.2 mmol/L (ref 3.5–5.2)
SODIUM: 140 mmol/L (ref 134–144)
Total Protein: 7.1 g/dL (ref 6.0–8.5)

## 2013-10-07 LAB — LIPID PANEL
Chol/HDL Ratio: 2.6 ratio units (ref 0.0–4.4)
Cholesterol, Total: 182 mg/dL (ref 100–199)
HDL: 69 mg/dL (ref >39)
LDL Calculated: 95 mg/dL (ref 0–99)
Triglycerides: 90 mg/dL (ref 0–149)
VLDL Cholesterol Cal: 18 mg/dL (ref 5–40)

## 2013-10-07 LAB — HEMOGLOBIN A1C
Est. average glucose Bld gHb Est-mCnc: 128 mg/dL
HEMOGLOBIN A1C: 6.1 % — AB (ref 4.8–5.6)

## 2013-10-12 ENCOUNTER — Ambulatory Visit (INDEPENDENT_AMBULATORY_CARE_PROVIDER_SITE_OTHER): Payer: Federal, State, Local not specified - PPO | Admitting: Internal Medicine

## 2013-10-12 ENCOUNTER — Encounter: Payer: Self-pay | Admitting: Internal Medicine

## 2013-10-12 VITALS — BP 130/76 | HR 74 | Temp 98.3°F | Resp 10 | Ht 68.03 in | Wt 264.0 lb

## 2013-10-12 DIAGNOSIS — J309 Allergic rhinitis, unspecified: Secondary | ICD-10-CM

## 2013-10-12 DIAGNOSIS — Z0000 Encounter for general adult medical examination without abnormal findings: Secondary | ICD-10-CM | POA: Insufficient documentation

## 2013-10-12 DIAGNOSIS — E785 Hyperlipidemia, unspecified: Secondary | ICD-10-CM

## 2013-10-12 DIAGNOSIS — M159 Polyosteoarthritis, unspecified: Secondary | ICD-10-CM

## 2013-10-12 DIAGNOSIS — E119 Type 2 diabetes mellitus without complications: Secondary | ICD-10-CM

## 2013-10-12 DIAGNOSIS — I10 Essential (primary) hypertension: Secondary | ICD-10-CM

## 2013-10-12 DIAGNOSIS — K219 Gastro-esophageal reflux disease without esophagitis: Secondary | ICD-10-CM

## 2013-10-12 LAB — SPECIMEN STATUS REPORT

## 2013-10-12 MED ORDER — METFORMIN HCL 500 MG PO TABS: 500.0000 mg | ORAL_TABLET | ORAL | Status: DC

## 2013-10-12 NOTE — Progress Notes (Signed)
Patient ID: Cheryl [name], female   DOB: [birth year], 58 y.o.   MRN: [id number]    Chief Complaint  Patient presents with  . Annual Exam    Yearly check-up, no pap (GYN will complete)   Allergies  Allergen Reactions  . Oxycodone     Had stomach and headache as side effect from medicine  . Sulfur Diarrhea   HPI 58 y/o female patient is here for annual exam. She denies any complaints today. She has been careful with her meals and walking for exercise. She has lost 5 lbs since last visit.  She follows with her gyn, s/p hysterectomy and does not get pap smear. Will get her mammogram and breast exam with her gyn Had colonoscopy 2011with repeat due in 2021  dexa scan in past s/o osteopenia as per patient, again no record for review.  Review of Systems  Constitutional: Negative for fever, chills, malaise/fatigue and diaphoresis.  HENT: Negative for congestion, hearing loss and sore throat.   Eyes: Negative for blurred vision, double vision and discharge. wears glasses and has cataract Respiratory: Negative for cough, sputum production, shortness of breath and wheezing.   Cardiovascular: Negative for chest pain, palpitations, orthopnea and leg swelling.  Gastrointestinal: Negative for heartburn, nausea, vomiting, abdominal pain, diarrhea and constipation.  Genitourinary: Negative for dysuria, urgency, frequency and flank pain.  Musculoskeletal: Negative for back pain, falls, joint pain and myalgias. s/p both knee arthroplasty and right shoulder arthroplasty. Is pending surgery for left rotator cuff repair Skin: Negative for itching and rash.  Neurological: Negative for dizziness, tingling, focal weakness and headaches.  Psychiatric/Behavioral: Negative for depression and memory loss. The patient is not nervous/anxious.    Wt Readings from Last 3 Encounters:  10/12/13 264 lb (119.75 kg)  06/14/13 269 lb 9.6 oz (122.29 kg)  05/03/13 263 lb 3.2 oz (119.387 kg)   Past Medical History    Diagnosis Date  . Hypertension   . Diabetes mellitus without complication     diet- controlled  . Asthma   . GERD (gastroesophageal reflux disease)   . Arthritis   . Impaired fasting glucose   . Morbid obesity   . Carpal tunnel syndrome   . Other specified cardiac dysrhythmias(427.89)   . Symptomatic menopausal or female climacteric states   . Allergic rhinitis, cause unspecified   . Pain in joint, site unspecified   . Other malaise and fatigue    Past Surgical History  Procedure Laterality Date  . Carpal tunnel both hands      2012  . Joint replacement      both knees replacement, 2010  . Abdominal hysterectomy      1994  . Nodule removed from back    . Mole removed from face    . Tonsillectomy      as teenager   Current Outpatient Prescriptions on File Prior to Visit  Medication Sig Dispense Refill  . ACCU-CHEK AVIVA test strip Use as instructed  100 each  12  . AMBULATORY NON FORMULARY MEDICATION Medication Name: Allergy Injection- once weekly      . Ascorbic Acid (VITAMIN C) 100 MG tablet Take 1,000 mg by mouth daily.      Marland Kitchen aspirin 81 MG tablet Take 81 mg by mouth daily.      Marland Kitchen azelastine (ASTELIN) 137 MCG/SPRAY nasal spray Place 1 spray into the nose 2 (two) times daily. Use in each nostril as directed      . B-D ULTRA-FINE 33 LANCETS MISC 1  each by Does not apply route as directed.  100 each  6  . beclomethasone (QVAR) 80 MCG/ACT inhaler Inhale 1 puff into the lungs as needed.      . Biotin 1000 MCG tablet Take one tablet once daily      . Calcium Carbonate-Vitamin D (CALTRATE 600+D) 600-400 MG-UNIT per chew tablet Chew 1 tablet by mouth daily. Chew one tablet once a day      . EPIPEN 2-PAK 0.3 MG/0.3ML SOAJ injection       . fexofenadine (ALLEGRA) 180 MG tablet Take 180 mg by mouth daily.      . fluticasone (VERAMYST) 27.5 MCG/SPRAY nasal spray Place 2 sprays into the nose 2 (two) times daily.      . metFORMIN (GLUCOPHAGE) 500 MG tablet TAKE 1 TABLET EVERY DAY FOR  BLOOD SUGAR  90 tablet  0  . Multiple Vitamin (MULTIVITAMIN) tablet Take 1 tablet by mouth daily. Take one tablet once a day      . omeprazole (PRILOSEC) 10 MG capsule TAKE 1 CAPSULE (10 MG TOTAL) BY MOUTH DAILY.  30 capsule  3  . simvastatin (ZOCOR) 10 MG tablet TAKE 1 TABLET EVERY DAY FOR CHOLESTEROL  90 tablet  1  . traMADol (ULTRAM) 50 MG tablet Take 50 mg by mouth every 8 (eight) hours as needed for pain (take one tab every 8-12 hour as needed for pain).      . triamterene-hydrochlorothiazide (MAXZIDE) 75-50 MG per tablet TAKE 1 TABLET EVERY DAY FOR BLOOD PRESSURE  90 tablet  1  . vitamin E (VITAMIN E) 400 UNIT capsule Take 400 Units by mouth daily.      Marland Kitchen loratadine (ALAVERT) 10 MG tablet Take 10 mg by mouth daily.       No current facility-administered medications on file prior to visit.   Family History  Problem Relation Age of Onset  . Cancer Father   . Diabetes Father    Past Surgical History  Procedure Laterality Date  . Carpal tunnel both hands      2012  . Joint replacement      both knees replacement, 2010  . Abdominal hysterectomy      1994  . Nodule removed from back    . Mole removed from face    . Tonsillectomy      as teenager   Physical exam BP 130/76  Pulse 74  Temp(Src) 98.3 F (36.8 C) (Oral)  Resp 10  Ht 5' 8.03" (1.728 m)  Wt 264 lb (119.75 kg)  BMI 40.10 kg/m2  SpO2 98%  General- elderly female in no acute distress, overweight Head- atraumatic, normocephalic Eyes- PERRLA, EOMI, no pallor, no icterus, no discharge Ears- left ear normal tympanic membrane and normal external ear canal , right ear normal tympanic membrane and normal external ear canal Neck- no lymphadenopathy, no thyromegaly, no jugular vein distension, no carotid bruit Nose- normal nasaal mucosa, no maxillary sinus tenderness, no frontal sinus tenderness Mouth- normal mucus membrane, no oral thrush, normal oropharynx Chest- no chest wall deformities, no chest wall  tenderness Breast- refused Cardiovascular- normal s1,s2, no murmurs/ rubs/ gallops, normal distal pulses Respiratory- bilateral clear to auscultation, no wheeze, no rhonchi, no crackles Abdomen- bowel sounds present, soft, non tender, no organomegaly, no abdominal bruits, no guarding or rigidity, no CVA tenderness Pelvic exam- refused Musculoskeletal- able to move all 4 extremities, no spinal and paraspinal tenderness, steady gait, no use of assistive device, scar in both knees from prior surgery, right shoulder ROM  limited, otherwise normal range of motion, no leg edema Neurological- no focal deficit, normal reflexes, normal muscle strength, normal sensation to fine touch and vibration Skin- warm and dry Psychiatry- alert and oriented to person, place and time, normal mood and affect  Labs- Lab Results  Component Value Date   HGBA1C 6.1* 10/06/2013   Lipid Panel     Component Value Date/Time   TRIG 90 10/06/2013 1019   HDL 69 10/06/2013 1019   CHOLHDL 2.6 10/06/2013 1019   LDLCALC 95 10/06/2013 1019   Lab Results  Component Value Date   WBC 7.2 06/09/2013   HGB 13.6 06/09/2013   HCT 40.8 06/09/2013   MCV 82 06/09/2013   PLT 323 03/18/2009   CMP     Component Value Date/Time   NA 140 10/06/2013 1019   NA 138 03/08/2012 1600   K 4.2 10/06/2013 1019   CL 100 10/06/2013 1019   CO2 25 10/06/2013 1019   GLUCOSE 96 10/06/2013 1019   GLUCOSE 95 03/08/2012 1600   BUN 12 10/06/2013 1019   BUN 15 03/08/2012 1600   CREATININE 0.82 10/06/2013 1019   CALCIUM 10.0 10/06/2013 1019   PROT 7.1 10/06/2013 1019   PROT 7.5 03/09/2009 1306   ALBUMIN 4.7 03/09/2009 1306   AST 14 10/06/2013 1019   ALT 9 10/06/2013 1019   ALKPHOS 66 10/06/2013 1019   BILITOT 0.5 10/06/2013 1019   GFRNONAA 79 10/06/2013 1019   GFRAA 91 10/06/2013 1019   03/11/10 colonoscopy- small external and internal hemorrhoids  Assessment/plan  1. Allergic rhinitis, cause unspecified Stable, continue current regimen, no changes  made  2. GERD (gastroesophageal reflux disease) Symptom controlled with omeprazole for now. Weight loss encouraged  3. Type II or unspecified type diabetes mellitus without mention of complication, not stated as uncontrolled Well controlled sugar reading. Change metformin to 500 mg every other day with controlled a1c. can check cbg once a week and review a1c next visit. Continue asa and statin./ check urine microalbumin prior to next visit. Normal foot exam.  Warned about hypoglycemia - Hemoglobin A1c; Future - Basic Metabolic Panel; Future - Microalbumin/Creatinine Ratio, Urine; Future  4. Generalized osteoarthrosis, involving multiple sites Continue tramadol 50 mg q8-12 hr prn and this has been helping her. Continue exercise  5. Other and unspecified hyperlipidemia Continue zocor, no side effect reported - Lipid Panel; Future  6. Routine general medical examination at a health care facility the patient was counseled regarding the appropriate use of alcohol, regular self-examination of the breasts on a monthly basis, prevention of dental and periodontal disease, diet, regular sustained exercise for at least 30 minutes 5 times per week, routine screening interval for mammogram as recommended by the [place] and ACOG, the proper use of sunscreen and protective clothing, tobacco use, and recommended schedule for GI hemoccult testing, colonoscopy, cholesterol, thyroid and diabetes screening. Check thyroid panel. Also to get record for mammogram and dexa scan from her obgyn after it is done  7. HTN Stable. Continue maxzide and aspirin, monitor renal function ekg reviewed- no LVH changes noted. Sinus rhythm

## 2013-10-13 LAB — TSH: TSH: 2.44 u[IU]/mL (ref 0.450–4.500)

## 2013-10-13 LAB — SPECIMEN STATUS REPORT

## 2013-10-26 ENCOUNTER — Encounter: Payer: Self-pay | Admitting: Internal Medicine

## 2013-11-06 ENCOUNTER — Other Ambulatory Visit: Payer: Self-pay | Admitting: Internal Medicine

## 2013-11-08 ENCOUNTER — Ambulatory Visit (INDEPENDENT_AMBULATORY_CARE_PROVIDER_SITE_OTHER): Payer: Federal, State, Local not specified - PPO | Admitting: Internal Medicine

## 2013-11-08 ENCOUNTER — Encounter: Payer: Self-pay | Admitting: Internal Medicine

## 2013-11-08 VITALS — BP 130/78 | HR 65 | Temp 98.2°F | Resp 20 | Ht 68.0 in | Wt 263.2 lb

## 2013-11-08 DIAGNOSIS — R51 Headache: Secondary | ICD-10-CM

## 2013-11-08 DIAGNOSIS — S0990XA Unspecified injury of head, initial encounter: Secondary | ICD-10-CM

## 2013-11-08 DIAGNOSIS — R519 Headache, unspecified: Secondary | ICD-10-CM

## 2013-11-08 MED ORDER — NAPROXEN 500 MG PO TABS: 500.0000 mg | ORAL_TABLET | Freq: Two times a day (BID) | ORAL | Status: DC

## 2013-11-08 NOTE — Progress Notes (Signed)
Patient ID: RUFUS CYPERT, female   DOB: 05/24/1956, 58 y.o.   MRN: [id number]    Chief Complaint  Patient presents with  . Acute Visit    headaches x 3 weeks     HPI 57 y/o female patient is here for acute visit. She hit her head against a cabinet by accident 3 weeks back and has been having soreness with headache on the top of her head on and off.  Denies any new change of vision Denies nausea or vomiting No neck pain No bleeding reported No visible bruise reported  ROS No pain elsewhere No sinus pain No nasal discharge  Past Medical History  Diagnosis Date  . Hypertension   . Diabetes mellitus without complication     diet- controlled  . Asthma   . GERD (gastroesophageal reflux disease)   . Arthritis   . Impaired fasting glucose   . Morbid obesity   . Carpal tunnel syndrome   . Other specified cardiac dysrhythmias(427.89)   . Symptomatic menopausal or female climacteric states   . Allergic rhinitis, cause unspecified   . Pain in joint, site unspecified   . Other malaise and fatigue    Medication reviewed. See [facility]  Physical exam BP 130/78  Pulse 65  Temp(Src) 98.2 F (36.8 C) (Oral)  Resp 20  Ht 5\' 8"  (1.727 m)  Wt 263 lb 3.2 oz (119.387 kg)  BMI 40.03 kg/m2  SpO2 96%  gen- adult female in NAD Head- no palpable lesion/lump noted. Tenderness on palpation in occipital area Nose- no maxillary sinus or frontal sinus tenderness Ears- normal ear canal and tympanic membrane cvs- normal s1,s2, rrr respi-CTAB Neck- no cervical region tenderness, normal flexion and extension   Lab Results  Component Value Date   CREATININE 0.82 10/06/2013   Assessment/plan  1. Recent head injury With headache now. No visible injury requiring additional imaging/ tests at present. Will have her on naproxen bid for now and reassess  2. Headache Post trauma/ injury. No penetrating injury. No hematoma or cyst or mass on exam. Has headache from the trauma. Will have her on  naproxen 500 mg bid to help with inflammation and pain. Reassess if no improvement

## 2013-11-14 LAB — HM DEXA SCAN

## 2013-11-23 HISTORY — PX: SHOULDER SURGERY: SHX246

## 2013-12-10 ENCOUNTER — Other Ambulatory Visit: Payer: Self-pay | Admitting: Internal Medicine

## 2013-12-13 ENCOUNTER — Ambulatory Visit: Payer: Federal, State, Local not specified - PPO | Attending: Orthopedic Surgery | Admitting: Physical Therapy

## 2013-12-13 DIAGNOSIS — M25619 Stiffness of unspecified shoulder, not elsewhere classified: Secondary | ICD-10-CM | POA: Insufficient documentation

## 2013-12-13 DIAGNOSIS — R609 Edema, unspecified: Secondary | ICD-10-CM | POA: Diagnosis not present

## 2013-12-13 DIAGNOSIS — M25519 Pain in unspecified shoulder: Secondary | ICD-10-CM | POA: Diagnosis present

## 2013-12-15 ENCOUNTER — Ambulatory Visit: Payer: Federal, State, Local not specified - PPO | Admitting: Physical Therapy

## 2013-12-15 DIAGNOSIS — M25519 Pain in unspecified shoulder: Secondary | ICD-10-CM | POA: Diagnosis not present

## 2013-12-20 ENCOUNTER — Ambulatory Visit: Payer: Federal, State, Local not specified - PPO | Admitting: Physical Therapy

## 2013-12-20 DIAGNOSIS — M25519 Pain in unspecified shoulder: Secondary | ICD-10-CM | POA: Diagnosis not present

## 2013-12-22 ENCOUNTER — Ambulatory Visit: Payer: Federal, State, Local not specified - PPO | Admitting: Physical Therapy

## 2013-12-22 DIAGNOSIS — M25519 Pain in unspecified shoulder: Secondary | ICD-10-CM | POA: Diagnosis not present

## 2013-12-27 ENCOUNTER — Ambulatory Visit: Payer: Federal, State, Local not specified - PPO | Attending: Orthopedic Surgery | Admitting: Physical Therapy

## 2013-12-27 DIAGNOSIS — M25519 Pain in unspecified shoulder: Secondary | ICD-10-CM | POA: Diagnosis present

## 2013-12-27 DIAGNOSIS — R609 Edema, unspecified: Secondary | ICD-10-CM | POA: Diagnosis not present

## 2013-12-27 DIAGNOSIS — M25619 Stiffness of unspecified shoulder, not elsewhere classified: Secondary | ICD-10-CM | POA: Diagnosis not present

## 2013-12-29 ENCOUNTER — Ambulatory Visit: Payer: Federal, State, Local not specified - PPO | Admitting: Physical Therapy

## 2013-12-29 DIAGNOSIS — M25519 Pain in unspecified shoulder: Secondary | ICD-10-CM | POA: Diagnosis not present

## 2014-01-03 ENCOUNTER — Ambulatory Visit: Payer: Federal, State, Local not specified - PPO | Admitting: Physical Therapy

## 2014-01-03 DIAGNOSIS — M25519 Pain in unspecified shoulder: Secondary | ICD-10-CM | POA: Diagnosis not present

## 2014-01-05 ENCOUNTER — Ambulatory Visit: Payer: Federal, State, Local not specified - PPO | Admitting: Physical Therapy

## 2014-01-05 DIAGNOSIS — M25519 Pain in unspecified shoulder: Secondary | ICD-10-CM | POA: Diagnosis not present

## 2014-01-10 ENCOUNTER — Ambulatory Visit: Payer: Federal, State, Local not specified - PPO | Admitting: Physical Therapy

## 2014-01-10 DIAGNOSIS — M25519 Pain in unspecified shoulder: Secondary | ICD-10-CM | POA: Diagnosis not present

## 2014-01-12 ENCOUNTER — Ambulatory Visit: Payer: Federal, State, Local not specified - PPO | Admitting: Physical Therapy

## 2014-01-12 DIAGNOSIS — M25519 Pain in unspecified shoulder: Secondary | ICD-10-CM | POA: Diagnosis not present

## 2014-01-17 ENCOUNTER — Ambulatory Visit: Payer: Federal, State, Local not specified - PPO | Admitting: Physical Therapy

## 2014-01-17 DIAGNOSIS — M25519 Pain in unspecified shoulder: Secondary | ICD-10-CM | POA: Diagnosis not present

## 2014-01-19 ENCOUNTER — Ambulatory Visit: Payer: Federal, State, Local not specified - PPO | Admitting: Physical Therapy

## 2014-01-19 DIAGNOSIS — M25519 Pain in unspecified shoulder: Secondary | ICD-10-CM | POA: Diagnosis not present

## 2014-01-24 ENCOUNTER — Ambulatory Visit: Payer: Federal, State, Local not specified - PPO | Attending: Orthopedic Surgery | Admitting: Physical Therapy

## 2014-01-24 DIAGNOSIS — R609 Edema, unspecified: Secondary | ICD-10-CM | POA: Insufficient documentation

## 2014-01-24 DIAGNOSIS — M25519 Pain in unspecified shoulder: Secondary | ICD-10-CM | POA: Diagnosis present

## 2014-01-24 DIAGNOSIS — M25619 Stiffness of unspecified shoulder, not elsewhere classified: Secondary | ICD-10-CM | POA: Insufficient documentation

## 2014-01-26 ENCOUNTER — Ambulatory Visit: Payer: Federal, State, Local not specified - PPO | Admitting: Physical Therapy

## 2014-01-26 DIAGNOSIS — M25519 Pain in unspecified shoulder: Secondary | ICD-10-CM | POA: Diagnosis not present

## 2014-01-27 ENCOUNTER — Telehealth: Payer: Self-pay | Admitting: *Deleted

## 2014-01-27 ENCOUNTER — Other Ambulatory Visit: Payer: Self-pay | Admitting: *Deleted

## 2014-01-27 MED ORDER — OMEPRAZOLE 10 MG PO CPDR: DELAYED_RELEASE_CAPSULE | ORAL | Status: DC

## 2014-01-27 NOTE — Telephone Encounter (Signed)
[facility] [address] 

## 2014-01-27 NOTE — Telephone Encounter (Signed)
Message copied by Eilene Ghazi on Fri Jan 27, 2014  3:44 PM ------      Message from: Blanchie Serve      Created: Fri Jan 27, 2014  3:31 PM       Your bone scan is suggestive of low bone mass. Please continue to take ca-vit d supplement total of ca 1200 mg a day and vitamin d 800 u a day.  Will monitor result with repeat bone scan in 2 years ------

## 2014-01-31 ENCOUNTER — Ambulatory Visit: Payer: Federal, State, Local not specified - PPO | Admitting: Physical Therapy

## 2014-01-31 DIAGNOSIS — M25519 Pain in unspecified shoulder: Secondary | ICD-10-CM | POA: Diagnosis not present

## 2014-02-01 NOTE — Telephone Encounter (Signed)
Dr. Kendall Flack, GYN ordered and patient is already aware. Dr. Bubba Camp Notified.

## 2014-02-02 ENCOUNTER — Ambulatory Visit: Payer: Federal, State, Local not specified - PPO | Admitting: Physical Therapy

## 2014-02-02 DIAGNOSIS — M25519 Pain in unspecified shoulder: Secondary | ICD-10-CM | POA: Diagnosis not present

## 2014-02-03 ENCOUNTER — Encounter: Payer: Self-pay | Admitting: Internal Medicine

## 2014-02-07 ENCOUNTER — Ambulatory Visit: Payer: Federal, State, Local not specified - PPO | Admitting: Physical Therapy

## 2014-02-07 DIAGNOSIS — M25519 Pain in unspecified shoulder: Secondary | ICD-10-CM | POA: Diagnosis not present

## 2014-02-10 ENCOUNTER — Ambulatory Visit: Payer: Federal, State, Local not specified - PPO | Admitting: Physical Therapy

## 2014-02-10 ENCOUNTER — Other Ambulatory Visit: Payer: Federal, State, Local not specified - PPO

## 2014-02-10 DIAGNOSIS — M25519 Pain in unspecified shoulder: Secondary | ICD-10-CM | POA: Diagnosis not present

## 2014-02-10 DIAGNOSIS — E119 Type 2 diabetes mellitus without complications: Secondary | ICD-10-CM

## 2014-02-11 LAB — BASIC METABOLIC PANEL
BUN/Creatinine Ratio: 13 (ref 11–26)
BUN: 10 mg/dL (ref 8–27)
CO2: 25 mmol/L (ref 18–29)
Calcium: 10 mg/dL (ref 8.7–10.3)
Chloride: 98 mmol/L (ref 97–108)
Creatinine, Ser: 0.77 mg/dL (ref 0.57–1.00)
GFR calc Af Amer: 97 mL/min/{1.73_m2} (ref >59)
GFR calc non Af Amer: 84 mL/min/{1.73_m2} (ref >59)
GLUCOSE: 91 mg/dL (ref 65–99)
POTASSIUM: 4.1 mmol/L (ref 3.5–5.2)
Sodium: 142 mmol/L (ref 134–144)

## 2014-02-11 LAB — HEMOGLOBIN A1C
Est. average glucose Bld gHb Est-mCnc: 126 mg/dL
Hgb A1c MFr Bld: 6 % — ABNORMAL HIGH (ref 4.8–5.6)

## 2014-02-13 ENCOUNTER — Other Ambulatory Visit: Payer: Federal, State, Local not specified - PPO

## 2014-02-14 ENCOUNTER — Ambulatory Visit (INDEPENDENT_AMBULATORY_CARE_PROVIDER_SITE_OTHER): Payer: Federal, State, Local not specified - PPO | Admitting: Internal Medicine

## 2014-02-14 ENCOUNTER — Ambulatory Visit: Payer: Federal, State, Local not specified - PPO | Admitting: Physical Therapy

## 2014-02-14 ENCOUNTER — Other Ambulatory Visit: Payer: Self-pay | Admitting: Internal Medicine

## 2014-02-14 ENCOUNTER — Encounter: Payer: Self-pay | Admitting: Internal Medicine

## 2014-02-14 VITALS — BP 142/80 | HR 76 | Temp 99.1°F | Resp 20 | Ht 68.0 in | Wt 263.8 lb

## 2014-02-14 DIAGNOSIS — E785 Hyperlipidemia, unspecified: Secondary | ICD-10-CM

## 2014-02-14 DIAGNOSIS — M159 Polyosteoarthritis, unspecified: Secondary | ICD-10-CM

## 2014-02-14 DIAGNOSIS — K219 Gastro-esophageal reflux disease without esophagitis: Secondary | ICD-10-CM

## 2014-02-14 DIAGNOSIS — M25519 Pain in unspecified shoulder: Secondary | ICD-10-CM | POA: Diagnosis not present

## 2014-02-14 DIAGNOSIS — I10 Essential (primary) hypertension: Secondary | ICD-10-CM

## 2014-02-14 DIAGNOSIS — E119 Type 2 diabetes mellitus without complications: Secondary | ICD-10-CM

## 2014-02-14 LAB — MICROALBUMIN / CREATININE URINE RATIO
Creatinine, Ur: 53.2 mg/dL (ref 15.0–278.0)
MICROALB/CREAT RATIO: 28.4 mg/g creat (ref 0.0–30.0)
Microalbumin, Urine: 15.1 ug/mL (ref 0.0–17.0)

## 2014-02-14 NOTE — Progress Notes (Signed)
Patient ID: Cheryl [name], female   DOB: [birth year], 58 y.o.   MRN: [id number]    Chief Complaint  Patient presents with  . Medical Management of Chronic Issues   Allergies  Allergen Reactions  . Oxycodone     Had stomach and headache as side effect from medicine  . Sulfur Diarrhea   HPI 58 y/o female patient is seen for routine follow up visit.  She has undergone rotator cuff repiar in left shoulder and undergoing therapy at present. Pain is under better control, has not required pain medication today. She received her flu vaccine today. She denies any concerns this visit. She is compliant with her medications She has hx of HTN, DM, GERD, obesity  Wt Readings from Last 3 Encounters:  02/14/14 263 lb 12.8 oz (119.659 kg)  11/08/13 263 lb 3.2 oz (119.387 kg)  10/12/13 264 lb (119.75 kg)   Review of Systems  Constitutional: Negative for fever, chills, malaise/fatigue and diaphoresis.  HENT: Negative for congestion, hearing loss and sore throat.   Eyes: Negative for blurred vision, double vision and discharge. uptodate with eye exam Respiratory: Negative for cough, sputum production, shortness of breath and wheezing.   Cardiovascular: Negative for chest pain, palpitations, orthopnea and leg swelling.  Gastrointestinal: Negative for heartburn, nausea, vomiting, abdominal pain, diarrhea and constipation.  Genitourinary: Negative for dysuria, urgency, frequency and flank pain.  Musculoskeletal: Negative for back pain, falls  Skin: Negative for itching and rash.  Neurological: Negative for dizziness, tingling, focal weakness and headaches.  Psychiatric/Behavioral: Negative for depression and memory loss. No insomnia. The patient is not nervous/anxious.    Past Medical History  Diagnosis Date  . Hypertension   . Diabetes mellitus without complication     diet- controlled  . Asthma   . GERD (gastroesophageal reflux disease)   . Arthritis   . Impaired fasting glucose   . Morbid  obesity   . Carpal tunnel syndrome   . Other specified cardiac dysrhythmias(427.89)   . Symptomatic menopausal or female climacteric states   . Allergic rhinitis, cause unspecified   . Pain in joint, site unspecified   . Other malaise and fatigue    Current Outpatient Prescriptions on File Prior to Visit  Medication Sig Dispense Refill  . ACCU-CHEK AVIVA test strip Use as instructed  100 each  12  . Ascorbic Acid (VITAMIN C) 100 MG tablet Take 1,000 mg by mouth daily.      Marland Kitchen aspirin 81 MG tablet Take 81 mg by mouth daily.      Marland Kitchen azelastine (ASTELIN) 137 MCG/SPRAY nasal spray Place 1 spray into the nose 2 (two) times daily. Use in each nostril as directed      . B-D ULTRA-FINE 33 LANCETS MISC 1 each by Does not apply route as directed.  100 each  6  . beclomethasone (QVAR) 80 MCG/ACT inhaler Inhale 1 puff into the lungs as needed.      . Biotin 1000 MCG tablet Take one tablet once daily      . Calcium Carbonate-Vitamin D (CALTRATE 600+D) 600-400 MG-UNIT per chew tablet Chew 1 tablet by mouth daily. Chew one tablet once a day      . EPIPEN 2-PAK 0.3 MG/0.3ML SOAJ injection       . fexofenadine (ALLEGRA) 180 MG tablet Take 180 mg by mouth daily.      . fluticasone (FLONASE) 50 MCG/ACT nasal spray       . fluticasone (VERAMYST) 27.5 MCG/SPRAY nasal spray Place 2  sprays into the nose 2 (two) times daily.      Marland Kitchen loratadine (ALAVERT) 10 MG tablet Take 10 mg by mouth daily.      . metFORMIN (GLUCOPHAGE) 500 MG tablet Take 1 tablet (500 mg total) by mouth every other day.  90 tablet  0  . Multiple Vitamin (MULTIVITAMIN) tablet Take 1 tablet by mouth daily. Take one tablet once a day      . omeprazole (PRILOSEC) 10 MG capsule TAKE 1 CAPSULE (10 MG TOTAL) BY MOUTH DAILY.  30 capsule  3  . simvastatin (ZOCOR) 10 MG tablet TAKE 1 TABLET EVERY DAY FOR CHOLESTEROL  90 tablet  1  . traMADol (ULTRAM) 50 MG tablet Take 50 mg by mouth every 8 (eight) hours as needed for pain (take one tab every 8-12 hour as  needed for pain).      . triamterene-hydrochlorothiazide (MAXZIDE) 75-50 MG per tablet TAKE 1 TABLET EVERY DAY FOR BLOOD PRESSURE  90 tablet  1  . vitamin E (VITAMIN E) 400 UNIT capsule Take 400 Units by mouth daily.      . AMBULATORY NON FORMULARY MEDICATION Medication Name: Allergy Injection- once weekly       No current facility-administered medications on file prior to visit.   Physical exam BP 142/80  Pulse 76  Temp(Src) 99.1 F (37.3 C) (Oral)  Resp 20  Ht 5\' 8"  (1.727 m)  Wt 263 lb 12.8 oz (119.659 kg)  BMI 40.12 kg/m2  SpO2 98%  General- elderly female in no acute distress, overweight Head- atraumatic, normocephalic Neck- no lymphadenopathy, no thyromegaly, no jugular vein distension, no carotid bruit Mouth- normal mucus membrane, no oral thrush, normal oropharynx Cardiovascular- normal s1,s2, no murmurs, normal distal pulses Respiratory- bilateral clear to auscultation, no wheeze, no rhonchi, no crackles Abdomen- bowel sounds present, soft, non tender Musculoskeletal- able to move all 4 extremities, no spinal and paraspinal tenderness, steady gait, no leg edema Neurological- no focal deficit, normal reflexes, normal muscle strength, normal sensation to fine touch and vibration Skin- warm and dry, few dark spots on right leg- appears to be hyperpigmented spots Psychiatry- alert and oriented to person, place and time, normal mood and affect   Lab Results  Component Value Date   HGBA1C 6.0* 02/10/2014   CMP     Component Value Date/Time   NA 142 02/10/2014 0952   NA 138 03/08/2012 1600   K 4.1 02/10/2014 0952   CL 98 02/10/2014 0952   CO2 25 02/10/2014 0952   GLUCOSE 91 02/10/2014 0952   GLUCOSE 95 03/08/2012 1600   BUN 10 02/10/2014 0952   BUN 15 03/08/2012 1600   CREATININE 0.77 02/10/2014 0952   CALCIUM 10.0 02/10/2014 0952   PROT 7.1 10/06/2013 1019   PROT 7.5 03/09/2009 1306   ALBUMIN 4.7 03/09/2009 1306   AST 14 10/06/2013 1019   ALT 9 10/06/2013 1019   ALKPHOS  66 10/06/2013 1019   BILITOT 0.5 10/06/2013 1019   GFRNONAA 84 02/10/2014 0952   GFRAA 97 02/10/2014 0952   Lipid Panel     Component Value Date/Time   TRIG 90 10/06/2013 1019   HDL 69 10/06/2013 1019   CHOLHDL 2.6 10/06/2013 1019   LDLCALC 95 10/06/2013 1019   Assessment/plan  Type II or unspecified type diabetes mellitus without mention of complication, not stated as uncontrolled Reviewed a1c. Continue metformin 500 mg every other day. Normal foot exam today. Urine microalbumin normal. Continue asa and statin.  - CBC with Differential; Future -  CMP; Future - Hemoglobin A1c; Future - TSH; Future   GERD Symptom controlled with omeprazole for now. Weight loss encouraged  Generalized osteoarthrosis, involving multiple sites Continue tramadol 50 mg prn and this has been helping her. Continue exercise  Other and unspecified hyperlipidemia Continue zocor  HTN Stable. Continue maxzide and aspirin, monitor renal function

## 2014-02-16 ENCOUNTER — Ambulatory Visit: Payer: Federal, State, Local not specified - PPO | Admitting: Physical Therapy

## 2014-02-16 DIAGNOSIS — M25519 Pain in unspecified shoulder: Secondary | ICD-10-CM | POA: Diagnosis not present

## 2014-02-21 ENCOUNTER — Ambulatory Visit: Payer: Federal, State, Local not specified - PPO | Admitting: Physical Therapy

## 2014-02-21 DIAGNOSIS — M25519 Pain in unspecified shoulder: Secondary | ICD-10-CM | POA: Diagnosis not present

## 2014-02-23 ENCOUNTER — Ambulatory Visit: Payer: Federal, State, Local not specified - PPO | Attending: Orthopedic Surgery | Admitting: Physical Therapy

## 2014-02-23 DIAGNOSIS — M25612 Stiffness of left shoulder, not elsewhere classified: Secondary | ICD-10-CM | POA: Diagnosis present

## 2014-02-23 DIAGNOSIS — M25512 Pain in left shoulder: Secondary | ICD-10-CM | POA: Diagnosis present

## 2014-02-23 DIAGNOSIS — R609 Edema, unspecified: Secondary | ICD-10-CM | POA: Diagnosis not present

## 2014-02-23 DIAGNOSIS — E119 Type 2 diabetes mellitus without complications: Secondary | ICD-10-CM | POA: Diagnosis not present

## 2014-02-23 DIAGNOSIS — Z9889 Other specified postprocedural states: Secondary | ICD-10-CM | POA: Insufficient documentation

## 2014-02-23 DIAGNOSIS — I10 Essential (primary) hypertension: Secondary | ICD-10-CM | POA: Insufficient documentation

## 2014-02-27 ENCOUNTER — Other Ambulatory Visit: Payer: Self-pay | Admitting: Internal Medicine

## 2014-02-28 ENCOUNTER — Ambulatory Visit: Payer: Federal, State, Local not specified - PPO | Admitting: Physical Therapy

## 2014-02-28 DIAGNOSIS — M25512 Pain in left shoulder: Secondary | ICD-10-CM | POA: Diagnosis not present

## 2014-03-02 ENCOUNTER — Ambulatory Visit: Payer: Federal, State, Local not specified - PPO | Admitting: Physical Therapy

## 2014-03-02 DIAGNOSIS — M25512 Pain in left shoulder: Secondary | ICD-10-CM | POA: Diagnosis not present

## 2014-03-03 ENCOUNTER — Other Ambulatory Visit: Payer: Self-pay | Admitting: Internal Medicine

## 2014-03-07 ENCOUNTER — Ambulatory Visit: Payer: Federal, State, Local not specified - PPO | Admitting: Physical Therapy

## 2014-03-07 DIAGNOSIS — M25512 Pain in left shoulder: Secondary | ICD-10-CM | POA: Diagnosis not present

## 2014-03-09 ENCOUNTER — Ambulatory Visit: Payer: Federal, State, Local not specified - PPO | Admitting: Physical Therapy

## 2014-03-09 DIAGNOSIS — M25512 Pain in left shoulder: Secondary | ICD-10-CM | POA: Diagnosis not present

## 2014-03-10 ENCOUNTER — Other Ambulatory Visit: Payer: Self-pay | Admitting: *Deleted

## 2014-03-10 MED ORDER — METFORMIN HCL 500 MG PO TABS: 500.0000 mg | ORAL_TABLET | ORAL | Status: DC

## 2014-03-10 NOTE — Telephone Encounter (Signed)
[facility] 

## 2014-03-14 ENCOUNTER — Ambulatory Visit: Payer: Federal, State, Local not specified - PPO | Admitting: Physical Therapy

## 2014-03-14 DIAGNOSIS — M25512 Pain in left shoulder: Secondary | ICD-10-CM | POA: Diagnosis not present

## 2014-03-16 ENCOUNTER — Ambulatory Visit: Payer: Federal, State, Local not specified - PPO | Admitting: Physical Therapy

## 2014-03-16 DIAGNOSIS — M25512 Pain in left shoulder: Secondary | ICD-10-CM | POA: Diagnosis not present

## 2014-03-17 ENCOUNTER — Other Ambulatory Visit (HOSPITAL_BASED_OUTPATIENT_CLINIC_OR_DEPARTMENT_OTHER): Payer: Self-pay | Admitting: Internal Medicine

## 2014-03-21 ENCOUNTER — Ambulatory Visit: Payer: Federal, State, Local not specified - PPO | Admitting: Physical Therapy

## 2014-03-21 DIAGNOSIS — M25512 Pain in left shoulder: Secondary | ICD-10-CM | POA: Diagnosis not present

## 2014-03-23 ENCOUNTER — Ambulatory Visit: Payer: Federal, State, Local not specified - PPO | Admitting: Physical Therapy

## 2014-03-23 DIAGNOSIS — M25512 Pain in left shoulder: Secondary | ICD-10-CM | POA: Diagnosis not present

## 2014-03-28 ENCOUNTER — Ambulatory Visit: Payer: Federal, State, Local not specified - PPO | Attending: Orthopedic Surgery | Admitting: Physical Therapy

## 2014-03-28 DIAGNOSIS — E119 Type 2 diabetes mellitus without complications: Secondary | ICD-10-CM | POA: Diagnosis not present

## 2014-03-28 DIAGNOSIS — Z9889 Other specified postprocedural states: Secondary | ICD-10-CM | POA: Insufficient documentation

## 2014-03-28 DIAGNOSIS — R609 Edema, unspecified: Secondary | ICD-10-CM | POA: Insufficient documentation

## 2014-03-28 DIAGNOSIS — M25612 Stiffness of left shoulder, not elsewhere classified: Secondary | ICD-10-CM | POA: Insufficient documentation

## 2014-03-28 DIAGNOSIS — I10 Essential (primary) hypertension: Secondary | ICD-10-CM | POA: Diagnosis not present

## 2014-03-28 DIAGNOSIS — M25512 Pain in left shoulder: Secondary | ICD-10-CM | POA: Diagnosis present

## 2014-03-30 ENCOUNTER — Ambulatory Visit: Payer: Federal, State, Local not specified - PPO | Admitting: Physical Therapy

## 2014-03-30 DIAGNOSIS — M25512 Pain in left shoulder: Secondary | ICD-10-CM | POA: Diagnosis not present

## 2014-04-04 ENCOUNTER — Ambulatory Visit: Payer: Federal, State, Local not specified - PPO | Admitting: Physical Therapy

## 2014-04-04 DIAGNOSIS — M25512 Pain in left shoulder: Secondary | ICD-10-CM | POA: Diagnosis not present

## 2014-04-06 ENCOUNTER — Ambulatory Visit: Payer: Federal, State, Local not specified - PPO | Admitting: Physical Therapy

## 2014-04-06 DIAGNOSIS — M25512 Pain in left shoulder: Secondary | ICD-10-CM | POA: Diagnosis not present

## 2014-04-11 ENCOUNTER — Ambulatory Visit: Payer: Federal, State, Local not specified - PPO | Admitting: Physical Therapy

## 2014-04-11 DIAGNOSIS — M25512 Pain in left shoulder: Secondary | ICD-10-CM | POA: Diagnosis not present

## 2014-04-13 ENCOUNTER — Ambulatory Visit: Payer: Federal, State, Local not specified - PPO | Admitting: Physical Therapy

## 2014-04-13 DIAGNOSIS — M25512 Pain in left shoulder: Secondary | ICD-10-CM | POA: Diagnosis not present

## 2014-04-18 ENCOUNTER — Ambulatory Visit: Payer: Federal, State, Local not specified - PPO | Admitting: Physical Therapy

## 2014-04-18 DIAGNOSIS — M25512 Pain in left shoulder: Secondary | ICD-10-CM | POA: Diagnosis not present

## 2014-04-24 ENCOUNTER — Other Ambulatory Visit: Payer: Self-pay

## 2014-04-24 ENCOUNTER — Telehealth: Payer: Self-pay | Admitting: *Deleted

## 2014-04-24 NOTE — Telephone Encounter (Signed)
Patient called regarding urine problems over the weekend, she stated that she had a odor to her urine and wanted to have it checked. She also stated that about 2 weeks ago she had a nurse practioner check her urine A1c and it was 7.8 and would like to have it check as well. I told her that she could come in the morning with a sample, and to come before she eat her breakfast.

## 2014-04-24 NOTE — Telephone Encounter (Signed)
ok 

## 2014-04-25 ENCOUNTER — Other Ambulatory Visit: Payer: Federal, State, Local not specified - PPO

## 2014-04-25 ENCOUNTER — Other Ambulatory Visit: Payer: Self-pay | Admitting: *Deleted

## 2014-04-25 ENCOUNTER — Ambulatory Visit: Payer: Federal, State, Local not specified - PPO | Attending: Orthopedic Surgery | Admitting: Physical Therapy

## 2014-04-25 DIAGNOSIS — M25512 Pain in left shoulder: Secondary | ICD-10-CM | POA: Diagnosis present

## 2014-04-25 DIAGNOSIS — Z9889 Other specified postprocedural states: Secondary | ICD-10-CM | POA: Insufficient documentation

## 2014-04-25 DIAGNOSIS — E118 Type 2 diabetes mellitus with unspecified complications: Secondary | ICD-10-CM

## 2014-04-25 DIAGNOSIS — M25612 Stiffness of left shoulder, not elsewhere classified: Secondary | ICD-10-CM | POA: Insufficient documentation

## 2014-04-25 DIAGNOSIS — E119 Type 2 diabetes mellitus without complications: Secondary | ICD-10-CM | POA: Diagnosis not present

## 2014-04-25 DIAGNOSIS — I10 Essential (primary) hypertension: Secondary | ICD-10-CM | POA: Insufficient documentation

## 2014-04-25 DIAGNOSIS — R609 Edema, unspecified: Secondary | ICD-10-CM | POA: Diagnosis not present

## 2014-04-25 HISTORY — PX: CLOSED MANIPULATION SHOULDER: SUR205

## 2014-04-26 LAB — COMPREHENSIVE METABOLIC PANEL
ALBUMIN: 4.8 g/dL (ref 3.6–4.8)
ALT: 14 IU/L (ref 0–32)
AST: 21 IU/L (ref 0–40)
Albumin/Globulin Ratio: 1.9 (ref 1.1–2.5)
Alkaline Phosphatase: 69 IU/L (ref 39–117)
BUN / CREAT RATIO: 12 (ref 11–26)
BUN: 10 mg/dL (ref 8–27)
CALCIUM: 10 mg/dL (ref 8.7–10.3)
CHLORIDE: 98 mmol/L (ref 97–108)
CO2: 24 mmol/L (ref 18–29)
CREATININE: 0.86 mg/dL (ref 0.57–1.00)
GFR calc Af Amer: 85 mL/min/{1.73_m2} (ref >59)
GFR calc non Af Amer: 74 mL/min/{1.73_m2} (ref >59)
GLOBULIN, TOTAL: 2.5 g/dL (ref 1.5–4.5)
GLUCOSE: 101 mg/dL — AB (ref 65–99)
Potassium: 4.1 mmol/L (ref 3.5–5.2)
Sodium: 142 mmol/L (ref 134–144)
TOTAL PROTEIN: 7.3 g/dL (ref 6.0–8.5)
Total Bilirubin: 0.6 mg/dL (ref 0.0–1.2)

## 2014-04-26 LAB — HEMOGLOBIN A1C
ESTIMATED AVERAGE GLUCOSE: 123 mg/dL
HEMOGLOBIN A1C: 5.9 % — AB (ref 4.8–5.6)

## 2014-04-26 LAB — URINALYSIS
BILIRUBIN UA: NEGATIVE
Glucose, UA: NEGATIVE
Ketones, UA: NEGATIVE
Leukocytes, UA: NEGATIVE
Nitrite, UA: NEGATIVE
PH UA: 6 (ref 5.0–7.5)
Protein, UA: NEGATIVE
RBC UA: NEGATIVE
SPEC GRAV UA: 1.013 (ref 1.005–1.030)
UUROB: 0.2 mg/dL (ref 0.2–1.0)

## 2014-04-26 LAB — CBC WITH DIFFERENTIAL/PLATELET
Basophils Absolute: 0 10*3/uL (ref 0.0–0.2)
Basos: 0 %
EOS: 4 %
Eosinophils Absolute: 0.3 10*3/uL (ref 0.0–0.4)
HEMATOCRIT: 39.5 % (ref 34.0–46.6)
Hemoglobin: 14.1 g/dL (ref 11.1–15.9)
IMMATURE GRANULOCYTES: 0 %
Immature Grans (Abs): 0 10*3/uL (ref 0.0–0.1)
LYMPHS: 27 %
Lymphocytes Absolute: 2.2 10*3/uL (ref 0.7–3.1)
MCH: 28 pg (ref 26.6–33.0)
MCHC: 35.7 g/dL (ref 31.5–35.7)
MCV: 78 fL — ABNORMAL LOW (ref 79–97)
MONOCYTES: 9 %
Monocytes Absolute: 0.7 10*3/uL (ref 0.1–0.9)
Neutrophils Absolute: 4.7 10*3/uL (ref 1.4–7.0)
Neutrophils Relative %: 60 %
RBC: 5.04 x10E6/uL (ref 3.77–5.28)
RDW: 14.7 % (ref 12.3–15.4)
WBC: 7.9 10*3/uL (ref 3.4–10.8)

## 2014-04-26 LAB — URINE CULTURE: Organism ID, Bacteria: NO GROWTH

## 2014-04-26 LAB — TSH: TSH: 2.86 u[IU]/mL (ref 0.450–4.500)

## 2014-04-27 ENCOUNTER — Telehealth: Payer: Self-pay | Admitting: *Deleted

## 2014-04-27 ENCOUNTER — Ambulatory Visit: Payer: Federal, State, Local not specified - PPO | Admitting: Physical Therapy

## 2014-04-27 DIAGNOSIS — M25512 Pain in left shoulder: Secondary | ICD-10-CM | POA: Diagnosis not present

## 2014-04-27 NOTE — Telephone Encounter (Signed)
Spoke with patient regarding lab result,she states that she drinks approximately 1/2 gallon of water daily. She also states that the only symptoms that she still has is a small ache in lower abdomen. She understood her lab results and would continue to drink plenty of water and take her metformin every day as instructed.

## 2014-04-27 NOTE — Telephone Encounter (Signed)
-----   Message from Blanchie Serve, MD sent at 04/27/2014 12:50 PM EST ----- No bacterial growth in your urine. Encourage water intake for now. Does she still have symptoms? Also blood test suggestive of better controlled sugar. Continue metformin every other day for now.

## 2014-05-01 ENCOUNTER — Ambulatory Visit: Payer: Federal, State, Local not specified - PPO | Admitting: Physical Therapy

## 2014-05-01 DIAGNOSIS — M25512 Pain in left shoulder: Secondary | ICD-10-CM | POA: Diagnosis not present

## 2014-05-09 ENCOUNTER — Ambulatory Visit: Payer: Federal, State, Local not specified - PPO | Admitting: Physical Therapy

## 2014-05-09 DIAGNOSIS — M25512 Pain in left shoulder: Secondary | ICD-10-CM | POA: Diagnosis not present

## 2014-05-10 ENCOUNTER — Ambulatory Visit: Payer: Federal, State, Local not specified - PPO | Admitting: Physical Therapy

## 2014-05-10 DIAGNOSIS — M25512 Pain in left shoulder: Secondary | ICD-10-CM | POA: Diagnosis not present

## 2014-05-11 ENCOUNTER — Ambulatory Visit: Payer: Federal, State, Local not specified - PPO | Admitting: Physical Therapy

## 2014-05-12 ENCOUNTER — Ambulatory Visit: Payer: Federal, State, Local not specified - PPO | Admitting: Physical Therapy

## 2014-05-12 DIAGNOSIS — M25512 Pain in left shoulder: Secondary | ICD-10-CM | POA: Diagnosis not present

## 2014-05-15 ENCOUNTER — Ambulatory Visit: Payer: Federal, State, Local not specified - PPO | Admitting: Physical Therapy

## 2014-05-15 DIAGNOSIS — M25512 Pain in left shoulder: Secondary | ICD-10-CM | POA: Diagnosis not present

## 2014-05-16 ENCOUNTER — Ambulatory Visit: Payer: Federal, State, Local not specified - PPO | Admitting: Physical Therapy

## 2014-05-16 DIAGNOSIS — M25512 Pain in left shoulder: Secondary | ICD-10-CM | POA: Diagnosis not present

## 2014-05-17 ENCOUNTER — Ambulatory Visit: Payer: Federal, State, Local not specified - PPO | Admitting: Physical Therapy

## 2014-05-17 DIAGNOSIS — M25512 Pain in left shoulder: Secondary | ICD-10-CM | POA: Diagnosis not present

## 2014-05-18 ENCOUNTER — Ambulatory Visit: Payer: Federal, State, Local not specified - PPO | Admitting: Physical Therapy

## 2014-06-13 ENCOUNTER — Ambulatory Visit: Payer: Federal, State, Local not specified - PPO | Attending: Orthopedic Surgery | Admitting: Physical Therapy

## 2014-06-13 DIAGNOSIS — I10 Essential (primary) hypertension: Secondary | ICD-10-CM | POA: Insufficient documentation

## 2014-06-13 DIAGNOSIS — E119 Type 2 diabetes mellitus without complications: Secondary | ICD-10-CM | POA: Insufficient documentation

## 2014-06-13 DIAGNOSIS — R609 Edema, unspecified: Secondary | ICD-10-CM | POA: Insufficient documentation

## 2014-06-13 DIAGNOSIS — M25512 Pain in left shoulder: Secondary | ICD-10-CM | POA: Diagnosis present

## 2014-06-13 DIAGNOSIS — M25612 Stiffness of left shoulder, not elsewhere classified: Secondary | ICD-10-CM | POA: Insufficient documentation

## 2014-06-13 DIAGNOSIS — Z9889 Other specified postprocedural states: Secondary | ICD-10-CM | POA: Diagnosis not present

## 2014-07-11 ENCOUNTER — Other Ambulatory Visit: Payer: Self-pay | Admitting: Internal Medicine

## 2014-07-18 ENCOUNTER — Encounter: Payer: Self-pay | Admitting: Internal Medicine

## 2014-07-31 DIAGNOSIS — J301 Allergic rhinitis due to pollen: Secondary | ICD-10-CM | POA: Diagnosis not present

## 2014-07-31 DIAGNOSIS — J3089 Other allergic rhinitis: Secondary | ICD-10-CM | POA: Diagnosis not present

## 2014-08-01 DIAGNOSIS — M7502 Adhesive capsulitis of left shoulder: Secondary | ICD-10-CM | POA: Diagnosis not present

## 2014-08-05 ENCOUNTER — Other Ambulatory Visit: Payer: Self-pay | Admitting: Internal Medicine

## 2014-08-07 DIAGNOSIS — J3089 Other allergic rhinitis: Secondary | ICD-10-CM | POA: Diagnosis not present

## 2014-08-07 DIAGNOSIS — J301 Allergic rhinitis due to pollen: Secondary | ICD-10-CM | POA: Diagnosis not present

## 2014-08-11 ENCOUNTER — Other Ambulatory Visit: Payer: Medicare Other

## 2014-08-11 ENCOUNTER — Other Ambulatory Visit: Payer: Self-pay | Admitting: *Deleted

## 2014-08-11 DIAGNOSIS — E119 Type 2 diabetes mellitus without complications: Secondary | ICD-10-CM

## 2014-08-11 DIAGNOSIS — R7301 Impaired fasting glucose: Secondary | ICD-10-CM | POA: Diagnosis not present

## 2014-08-11 DIAGNOSIS — I10 Essential (primary) hypertension: Secondary | ICD-10-CM

## 2014-08-12 LAB — COMPREHENSIVE METABOLIC PANEL
ALT: 16 IU/L (ref 0–32)
AST: 20 IU/L (ref 0–40)
Albumin/Globulin Ratio: 2 (ref 1.1–2.5)
Albumin: 4.6 g/dL (ref 3.6–4.8)
Alkaline Phosphatase: 69 IU/L (ref 39–117)
BUN/Creatinine Ratio: 14 (ref 11–26)
BUN: 10 mg/dL (ref 8–27)
Bilirubin Total: 0.4 mg/dL (ref 0.0–1.2)
CHLORIDE: 97 mmol/L (ref 97–108)
CO2: 23 mmol/L (ref 18–29)
CREATININE: 0.72 mg/dL (ref 0.57–1.00)
Calcium: 9.8 mg/dL (ref 8.7–10.3)
GFR calc Af Amer: 105 mL/min/{1.73_m2} (ref >59)
GFR calc non Af Amer: 91 mL/min/{1.73_m2} (ref >59)
Globulin, Total: 2.3 g/dL (ref 1.5–4.5)
Glucose: 98 mg/dL (ref 65–99)
Potassium: 3.8 mmol/L (ref 3.5–5.2)
Sodium: 139 mmol/L (ref 134–144)
Total Protein: 6.9 g/dL (ref 6.0–8.5)

## 2014-08-12 LAB — LIPID PANEL
Chol/HDL Ratio: 3.6 ratio units (ref 0.0–4.4)
Cholesterol, Total: 192 mg/dL (ref 100–199)
HDL: 54 mg/dL (ref >39)
LDL Calculated: 113 mg/dL — ABNORMAL HIGH (ref 0–99)
Triglycerides: 125 mg/dL (ref 0–149)
VLDL Cholesterol Cal: 25 mg/dL (ref 5–40)

## 2014-08-12 LAB — CBC WITH DIFFERENTIAL/PLATELET
BASOS: 0 %
Basophils Absolute: 0 10*3/uL (ref 0.0–0.2)
Eos: 4 %
Eosinophils Absolute: 0.3 10*3/uL (ref 0.0–0.4)
HCT: 41.3 % (ref 34.0–46.6)
Hemoglobin: 13.5 g/dL (ref 11.1–15.9)
IMMATURE GRANS (ABS): 0 10*3/uL (ref 0.0–0.1)
Immature Granulocytes: 0 %
LYMPHS: 23 %
Lymphocytes Absolute: 1.7 10*3/uL (ref 0.7–3.1)
MCH: 27 pg (ref 26.6–33.0)
MCHC: 32.7 g/dL (ref 31.5–35.7)
MCV: 83 fL (ref 79–97)
Monocytes Absolute: 0.6 10*3/uL (ref 0.1–0.9)
Monocytes: 9 %
NEUTROS PCT: 64 %
Neutrophils Absolute: 4.7 10*3/uL (ref 1.4–7.0)
Platelets: 391 10*3/uL — ABNORMAL HIGH (ref 150–379)
RBC: 5 x10E6/uL (ref 3.77–5.28)
RDW: 14.9 % (ref 12.3–15.4)
WBC: 7.3 10*3/uL (ref 3.4–10.8)

## 2014-08-12 LAB — HEMOGLOBIN A1C
ESTIMATED AVERAGE GLUCOSE: 126 mg/dL
Hgb A1c MFr Bld: 6 % — ABNORMAL HIGH (ref 4.8–5.6)

## 2014-08-12 LAB — TSH: TSH: 2.72 u[IU]/mL (ref 0.450–4.500)

## 2014-08-14 DIAGNOSIS — J301 Allergic rhinitis due to pollen: Secondary | ICD-10-CM | POA: Diagnosis not present

## 2014-08-14 DIAGNOSIS — J3089 Other allergic rhinitis: Secondary | ICD-10-CM | POA: Diagnosis not present

## 2014-08-16 ENCOUNTER — Encounter: Payer: Federal, State, Local not specified - PPO | Admitting: Internal Medicine

## 2014-08-21 DIAGNOSIS — J301 Allergic rhinitis due to pollen: Secondary | ICD-10-CM | POA: Diagnosis not present

## 2014-08-28 DIAGNOSIS — J3089 Other allergic rhinitis: Secondary | ICD-10-CM | POA: Diagnosis not present

## 2014-08-28 DIAGNOSIS — J301 Allergic rhinitis due to pollen: Secondary | ICD-10-CM | POA: Diagnosis not present

## 2014-08-31 ENCOUNTER — Other Ambulatory Visit: Payer: Self-pay | Admitting: Internal Medicine

## 2014-09-04 DIAGNOSIS — J301 Allergic rhinitis due to pollen: Secondary | ICD-10-CM | POA: Diagnosis not present

## 2014-09-04 DIAGNOSIS — J3089 Other allergic rhinitis: Secondary | ICD-10-CM | POA: Diagnosis not present

## 2014-09-11 DIAGNOSIS — J301 Allergic rhinitis due to pollen: Secondary | ICD-10-CM | POA: Diagnosis not present

## 2014-09-11 DIAGNOSIS — J3089 Other allergic rhinitis: Secondary | ICD-10-CM | POA: Diagnosis not present

## 2014-09-12 ENCOUNTER — Encounter: Payer: Federal, State, Local not specified - PPO | Admitting: Internal Medicine

## 2014-09-12 DIAGNOSIS — M7502 Adhesive capsulitis of left shoulder: Secondary | ICD-10-CM | POA: Diagnosis not present

## 2014-09-13 ENCOUNTER — Ambulatory Visit (INDEPENDENT_AMBULATORY_CARE_PROVIDER_SITE_OTHER): Payer: Medicare Other | Admitting: Internal Medicine

## 2014-09-13 ENCOUNTER — Encounter: Payer: Self-pay | Admitting: Internal Medicine

## 2014-09-13 VITALS — BP 130/84 | HR 68 | Temp 98.2°F | Ht 69.0 in | Wt 270.0 lb

## 2014-09-13 DIAGNOSIS — E785 Hyperlipidemia, unspecified: Secondary | ICD-10-CM

## 2014-09-13 DIAGNOSIS — I10 Essential (primary) hypertension: Secondary | ICD-10-CM

## 2014-09-13 DIAGNOSIS — M159 Polyosteoarthritis, unspecified: Secondary | ICD-10-CM | POA: Diagnosis not present

## 2014-09-13 DIAGNOSIS — E119 Type 2 diabetes mellitus without complications: Secondary | ICD-10-CM | POA: Insufficient documentation

## 2014-09-13 MED ORDER — ZOSTER VACCINE LIVE 19400 UNT/0.65ML ~~LOC~~ SOLR: 0.6500 mL | Freq: Once | SUBCUTANEOUS | Status: DC

## 2014-09-13 MED ORDER — SIMVASTATIN 20 MG PO TABS: 20.0000 mg | ORAL_TABLET | Freq: Every day | ORAL | Status: DC

## 2014-09-13 NOTE — Progress Notes (Signed)
Patient ID: Cheryl [name], female   DOB: 1955-09-05, 59 y.o.   MRN: [id number]    Facility  [facility]    Place of Service:   OFFICE    Allergies  Allergen Reactions  . Oxycodone     Had stomach and headache as side effect from medicine  . Sulfur Diarrhea    Chief Complaint  Patient presents with  . Medical Management of Chronic Issues    Follow-up on DM (NOT due for CPX until 10/13/14)  . Mass    [place] neck for raised area. Patient was seen 1 month ago at dentist and was told she had a raised area on neck.     HPI:  59 yo female seen today for f/u. She reports feeling well overall. Maintains a healthy diet. She takes a statin for cholesterol. She exercises with walking and biking several times per week.   BP stable at home  She checks BS at home and usually <100. No low BS reactions. No numbness or tingling  No asthma exac. Seasonal allergy sx's stable.  Obesity - she gained several lbs since last OV. She is not walking as much due to knee pain. She saw Ortho and told to reduce walking due to pain. Hx b/l knee replacement  Past Medical History  Diagnosis Date  . Hypertension   . Diabetes mellitus without complication     diet- controlled  . Asthma   . GERD (gastroesophageal reflux disease)   . Arthritis   . Impaired fasting glucose   . Morbid obesity   . Carpal tunnel syndrome   . Other specified cardiac dysrhythmias(427.89)   . Symptomatic menopausal or female climacteric states   . Allergic rhinitis, cause unspecified   . Pain in joint, site unspecified   . Other malaise and fatigue    Past Surgical History  Procedure Laterality Date  . Carpal tunnel both hands      2012  . Joint replacement      both knees replacement, 2010  . Abdominal hysterectomy      1994  . Nodule removed from back    . Mole removed from face    . Tonsillectomy      as teenager  . Shoulder surgery Left 11/2013  . Closed manipulation shoulder Left 04/2014   History   Social History    . Marital Status: Single    Spouse Name: N/A  . Number of Children: N/A  . Years of Education: N/A   Social History Main Topics  . Smoking status: Never Smoker   . Smokeless tobacco: Never Used  . Alcohol Use: No  . Drug Use: No  . Sexual Activity: No     Comment: post office worker   Other Topics Concern  . None   Social History Narrative     Medications: Patient's Medications  New Prescriptions   No medications on file  Previous Medications   AMBULATORY NON FORMULARY MEDICATION    Medication Name: Allergy Injection- once weekly   ASCORBIC ACID (VITAMIN C) 100 MG TABLET    Take 1,000 mg by mouth daily.   ASPIRIN 81 MG TABLET    Take 81 mg by mouth daily.   AZELASTINE (ASTELIN) 137 MCG/SPRAY NASAL SPRAY    Place 1 spray into the nose 2 (two) times daily. Use in each nostril as directed   B-D ULTRA-FINE 33 LANCETS MISC    1 each by Does not apply route as directed.   BECLOMETHASONE (QVAR) 80  MCG/ACT INHALER    Inhale 1 puff into the lungs as needed.   BIOTIN 1000 MCG TABLET    Take one tablet once daily   CALCIUM CARBONATE-VITAMIN D (CALTRATE 600+D) 600-400 MG-UNIT PER CHEW TABLET    Chew 1 tablet by mouth daily. Chew one tablet once a day   EPIPEN 2-PAK 0.3 MG/0.3ML SOAJ INJECTION       FEXOFENADINE (ALLEGRA) 180 MG TABLET    Take 180 mg by mouth daily.   FLUTICASONE (FLONASE) 50 MCG/ACT NASAL SPRAY       FLUTICASONE (VERAMYST) 27.5 MCG/SPRAY NASAL SPRAY    Place 2 sprays into the nose 2 (two) times daily.   GLUCOSE BLOOD (ACCU-CHEK AVIVA PLUS) TEST STRIP    Check blood sugar once daily as instructed DX E11.9   LORATADINE (ALAVERT) 10 MG TABLET    Take 10 mg by mouth daily.   METFORMIN (GLUCOPHAGE) 500 MG TABLET    Take 1 tablet (500 mg total) by mouth every other day.   MULTIPLE VITAMIN (MULTIVITAMIN) TABLET    Take 1 tablet by mouth daily. Take one tablet once a day   OMEPRAZOLE (PRILOSEC) 10 MG CAPSULE    TAKE ONE CAPSULE BY MOUTH ONCE DAILY   SIMVASTATIN (ZOCOR) 10 MG  TABLET    TAKE 1 TABLET EVERY DAY FOR CHOLESTEROL   TRAMADOL (ULTRAM) 50 MG TABLET    Take 50 mg by mouth every 8 (eight) hours as needed for pain (take one tab every 8-12 hour as needed for pain).   TRIAMTERENE-HYDROCHLOROTHIAZIDE (MAXZIDE) 75-50 MG PER TABLET    TAKE 1 TABLET EVERY DAY FOR BLOOD PRESSURE   VITAMIN E (VITAMIN E) 400 UNIT CAPSULE    Take 400 Units by mouth daily.   ZOSTER VACCINE LIVE, PF, (ZOSTAVAX) 78588 UNT/0.65ML INJECTION    Inject 0.65 mLs into the skin once.  Modified Medications   No medications on file  Discontinued Medications   B-D ULTRA-FINE 33 LANCETS MISC    USE AS DIRECTED   B-D ULTRA-FINE 33 LANCETS MISC    USE AS DIRECTED   OMEPRAZOLE (PRILOSEC) 10 MG CAPSULE    TAKE 1 CAPSULE (10 MG TOTAL) BY MOUTH DAILY.     Review of Systems  Constitutional: Negative for fever, chills, diaphoresis, activity change, appetite change and fatigue.  HENT: Negative for ear pain and sore throat.   Eyes: Negative for visual disturbance.  Respiratory: Negative for cough, chest tightness and shortness of breath.   Cardiovascular: Negative for chest pain, palpitations and leg swelling.  Gastrointestinal: Negative for nausea, vomiting, abdominal pain, diarrhea, constipation and blood in stool.  Genitourinary: Negative for dysuria.  Musculoskeletal: Negative for arthralgias.  Neurological: Negative for dizziness, tremors, numbness and headaches.  Psychiatric/Behavioral: Negative for sleep disturbance. The patient is not nervous/anxious.     Filed Vitals:   09/13/14 1502  BP: 130/84  Pulse: 68  Temp: 98.2 F (36.8 C)  TempSrc: Oral  Height: 5\' 9"  (1.753 m)  Weight: 270 lb (122.471 kg)  SpO2: 97%   Body mass index is 39.85 kg/(m^2).  Physical Exam  Constitutional: She is oriented to person, place, and time. She appears well-developed and well-nourished.  HENT:  Mouth/Throat: Oropharynx is clear and moist. No oropharyngeal exudate.  Eyes: Pupils are equal, round, and  reactive to light. No scleral icterus.  Neck: Neck supple. No tracheal deviation present. No thyromegaly present.  Cardiovascular: Normal rate, regular rhythm, normal heart sounds and intact distal pulses.  Exam reveals no gallop and  no friction rub.   No murmur heard. No LE edema b/l. no calf TTP. No carotid bruit b/l  Pulmonary/Chest: Effort normal and breath sounds normal. No stridor. No respiratory distress. She has no wheezes. She has no rales.  Abdominal: Soft. Bowel sounds are normal. She exhibits no distension and no mass. There is no tenderness. There is no rebound and no guarding.  Musculoskeletal: She exhibits edema and tenderness.  Gait antalgic  Lymphadenopathy:       Head (right side): No submental, no submandibular, no posterior auricular and no occipital adenopathy present.       Head (left side): No submental, no submandibular, no posterior auricular and no occipital adenopathy present.    She has no cervical adenopathy.       Right: No supraclavicular adenopathy present.       Left: No supraclavicular adenopathy present.  Neurological: She is alert and oriented to person, place, and time. She has normal reflexes.  Monofilament testing intact b/l. No foot lesions.   Skin: Skin is warm and dry. No rash noted.  Psychiatric: She has a normal mood and affect. Her behavior is normal. Judgment and thought content normal.     Labs reviewed: Appointment on 08/11/2014  Component Date Value Ref Range Status  . Glucose 08/11/2014 98  65 - 99 mg/dL Final  . BUN 08/11/2014 10  8 - 27 mg/dL Final  . Creatinine, Ser 08/11/2014 0.72  0.57 - 1.00 mg/dL Final  . GFR calc non Af Amer 08/11/2014 91  >59 mL/min/1.73 Final  . GFR calc Af Amer 08/11/2014 105  >59 mL/min/1.73 Final  . BUN/Creatinine Ratio 08/11/2014 14  11 - 26 Final  . Sodium 08/11/2014 139  134 - 144 mmol/L Final  . Potassium 08/11/2014 3.8  3.5 - 5.2 mmol/L Final  . Chloride 08/11/2014 97  97 - 108 mmol/L Final  . CO2  08/11/2014 23  18 - 29 mmol/L Final  . Calcium 08/11/2014 9.8  8.7 - 10.3 mg/dL Final  . Total Protein 08/11/2014 6.9  6.0 - 8.5 g/dL Final  . Albumin 08/11/2014 4.6  3.6 - 4.8 g/dL Final  . Globulin, Total 08/11/2014 2.3  1.5 - 4.5 g/dL Final  . Albumin/Globulin Ratio 08/11/2014 2.0  1.1 - 2.5 Final  . Bilirubin Total 08/11/2014 0.4  0.0 - 1.2 mg/dL Final  . Alkaline Phosphatase 08/11/2014 69  39 - 117 IU/L Final  . AST 08/11/2014 20  0 - 40 IU/L Final  . ALT 08/11/2014 16  0 - 32 IU/L Final  . Cholesterol, Total 08/11/2014 192  100 - 199 mg/dL Final  . Triglycerides 08/11/2014 125  0 - 149 mg/dL Final  . HDL 08/11/2014 54  >39 mg/dL Final   Comment: According to ATP-III Guidelines, HDL-C >59 mg/dL is considered a negative risk factor for CHD.   Marland Kitchen VLDL Cholesterol Cal 08/11/2014 25  5 - 40 mg/dL Final  . LDL Calculated 08/11/2014 113* 0 - 99 mg/dL Final  . Chol/HDL Ratio 08/11/2014 3.6  0.0 - 4.4 ratio units Final   Comment:                                   T. Chol/HDL Ratio  Men  Women                               1/2 Avg.Risk  3.4    3.3                                   Avg.Risk  5.0    4.4                                2X Avg.Risk  9.6    7.1                                3X Avg.Risk 23.4   11.0   . WBC 08/11/2014 7.3  3.4 - 10.8 x10E3/uL Final  . RBC 08/11/2014 5.00  3.77 - 5.28 x10E6/uL Final  . Hemoglobin 08/11/2014 13.5  11.1 - 15.9 g/dL Final  . HCT 08/11/2014 41.3  34.0 - 46.6 % Final  . MCV 08/11/2014 83  79 - 97 fL Final  . MCH 08/11/2014 27.0  26.6 - 33.0 pg Final  . MCHC 08/11/2014 32.7  31.5 - 35.7 g/dL Final  . RDW 08/11/2014 14.9  12.3 - 15.4 % Final  . Platelets 08/11/2014 391* 150 - 379 x10E3/uL Final  . Neutrophils Relative % 08/11/2014 64   Final  . Lymphs 08/11/2014 23   Final  . Monocytes 08/11/2014 9   Final  . Eos 08/11/2014 4   Final  . Basos 08/11/2014 0   Final  . Neutrophils Absolute 08/11/2014 4.7   1.4 - 7.0 x10E3/uL Final  . Lymphocytes Absolute 08/11/2014 1.7  0.7 - 3.1 x10E3/uL Final  . Monocytes Absolute 08/11/2014 0.6  0.1 - 0.9 x10E3/uL Final  . Eosinophils Absolute 08/11/2014 0.3  0.0 - 0.4 x10E3/uL Final  . Basophils Absolute 08/11/2014 0.0  0.0 - 0.2 x10E3/uL Final  . Immature Granulocytes 08/11/2014 0   Final  . Immature Grans (Abs) 08/11/2014 0.0  0.0 - 0.1 x10E3/uL Final  . TSH 08/11/2014 2.720  0.450 - 4.500 uIU/mL Final  . Hgb A1c MFr Bld 08/11/2014 6.0* 4.8 - 5.6 % Final   Comment:          Pre-diabetes: 5.7 - 6.4          Diabetes: >6.4          Glycemic control for adults with diabetes: <7.0   . Est. average glucose Bld gHb Est-m* 08/11/2014 126   Final     Assessment/Plan   ICD-9-CM ICD-10-CM   1. Hyperlipidemia LDL goal <100 272.4 E78.5   2. Generalized osteoarthrosis, involving multiple sites - pain controlled 715.09 M15.9   3. Morbid obesity  278.01 E66.01   4. Essential hypertension, benign - controlled  401.1 I10   5. DM II - controlled on metformin  --increase statin 20mg  qhs.. New rx sent  --continue other meds as ordered  --f/u in 1-2 mos for CPE. Check UA and urine micro/cr ratio at next [place]. Perlie Gold  [facility] [address] [place],  [address] [phone] Office (Wednesdays and Fridays 8 AM - 5 PM) [phone] Cell (Monday-Friday 8 AM - 5 PM)

## 2014-09-13 NOTE — Patient Instructions (Signed)
Increase simvastatin 20mg  at bedtime  Continue other medications as ordered  Follow up in 1-2 mos for CPE

## 2014-09-18 DIAGNOSIS — J301 Allergic rhinitis due to pollen: Secondary | ICD-10-CM | POA: Diagnosis not present

## 2014-09-18 DIAGNOSIS — J3089 Other allergic rhinitis: Secondary | ICD-10-CM | POA: Diagnosis not present

## 2014-09-25 DIAGNOSIS — J301 Allergic rhinitis due to pollen: Secondary | ICD-10-CM | POA: Diagnosis not present

## 2014-09-25 DIAGNOSIS — J3089 Other allergic rhinitis: Secondary | ICD-10-CM | POA: Diagnosis not present

## 2014-09-25 LAB — HM MAMMOGRAPHY

## 2014-10-02 DIAGNOSIS — J301 Allergic rhinitis due to pollen: Secondary | ICD-10-CM | POA: Diagnosis not present

## 2014-10-02 DIAGNOSIS — J3089 Other allergic rhinitis: Secondary | ICD-10-CM | POA: Diagnosis not present

## 2014-10-05 DIAGNOSIS — J3089 Other allergic rhinitis: Secondary | ICD-10-CM | POA: Diagnosis not present

## 2014-10-05 DIAGNOSIS — J301 Allergic rhinitis due to pollen: Secondary | ICD-10-CM | POA: Diagnosis not present

## 2014-10-09 DIAGNOSIS — J301 Allergic rhinitis due to pollen: Secondary | ICD-10-CM | POA: Diagnosis not present

## 2014-10-09 DIAGNOSIS — J3089 Other allergic rhinitis: Secondary | ICD-10-CM | POA: Diagnosis not present

## 2014-10-16 DIAGNOSIS — J301 Allergic rhinitis due to pollen: Secondary | ICD-10-CM | POA: Diagnosis not present

## 2014-10-19 DIAGNOSIS — J301 Allergic rhinitis due to pollen: Secondary | ICD-10-CM | POA: Diagnosis not present

## 2014-10-19 DIAGNOSIS — J3089 Other allergic rhinitis: Secondary | ICD-10-CM | POA: Diagnosis not present

## 2014-10-20 ENCOUNTER — Ambulatory Visit (INDEPENDENT_AMBULATORY_CARE_PROVIDER_SITE_OTHER): Payer: Medicare Other | Admitting: Internal Medicine

## 2014-10-20 ENCOUNTER — Encounter: Payer: Self-pay | Admitting: Internal Medicine

## 2014-10-20 VITALS — BP 116/78 | HR 64 | Temp 98.4°F | Resp 18 | Ht 69.0 in | Wt 271.8 lb

## 2014-10-20 DIAGNOSIS — M159 Polyosteoarthritis, unspecified: Secondary | ICD-10-CM

## 2014-10-20 DIAGNOSIS — J301 Allergic rhinitis due to pollen: Secondary | ICD-10-CM

## 2014-10-20 DIAGNOSIS — Z0000 Encounter for general adult medical examination without abnormal findings: Secondary | ICD-10-CM | POA: Diagnosis not present

## 2014-10-20 DIAGNOSIS — E119 Type 2 diabetes mellitus without complications: Secondary | ICD-10-CM

## 2014-10-20 DIAGNOSIS — E785 Hyperlipidemia, unspecified: Secondary | ICD-10-CM

## 2014-10-20 DIAGNOSIS — I10 Essential (primary) hypertension: Secondary | ICD-10-CM

## 2014-10-20 NOTE — Progress Notes (Signed)
Patient ID: Cheryl [name], female   DOB: 11-04-1955, 59 y.o.   MRN: [id number] Subjective:    Cheryl [name] is a 59 y.o. female who presents for Medicare Initial preventive examination.  Preventive Screening-Counseling & Management  Tobacco History  Smoking status  . Never Smoker   Smokeless tobacco  . Never Used     Problems Prior to Visit 1. Allergic rhinitis 2. Mother expired since last visit at age 65. She lived at [facility].  Current Problems (verified) Patient Active Problem List   Diagnosis Date Noted  . DM w/o complication type II 75/43/6067  . Routine general medical examination at a health care facility 10/12/2013  . 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 05/03/2013  . Need for prophylactic vaccination and inoculation against influenza 02/08/2013  . GERD (gastroesophageal reflux disease) 02/08/2013  . Other and unspecified hyperlipidemia 11/10/2012  . Type II or unspecified type diabetes mellitus without mention of complication, not stated as uncontrolled 10/25/2012  . Impaired fasting glucose 09/07/2012  . Morbid obesity 09/07/2012  . Generalized osteoarthrosis, involving multiple sites 09/07/2012  . Carpal tunnel syndrome 09/07/2012  . Other, multiple, and unspecified sites, insect bite, nonvenomous, without mention of infection(919.4) 09/07/2012  . Special screening for malignant neoplasms, colon 09/07/2012  . Other specified cardiac dysrhythmias(427.89) 09/07/2012  . Candidiasis of vulva and vagina 09/07/2012  . Abdominal pain, generalized 09/07/2012  . Symptomatic menopausal or female climacteric states 09/07/2012  . Unspecified essential hypertension 09/07/2012  . Allergic rhinitis, cause unspecified 09/07/2012  . Pain in joint, site unspecified 09/07/2012  . Other malaise and fatigue 09/07/2012   Past Surgical History  Procedure Laterality Date  . Carpal tunnel both hands      2012  . Joint replacement      both knees replacement, 2010  . Abdominal  hysterectomy      1994  . Nodule removed from back    . Mole removed from face    . Tonsillectomy      as teenager  . Shoulder surgery Left 11/2013  . Closed manipulation shoulder Left 04/2014   History   Social History  . Marital Status: Single    Spouse Name: N/A  . Number of Children: N/A  . Years of Education: N/A   Social History Main Topics  . Smoking status: Never Smoker   . Smokeless tobacco: Never Used  . Alcohol Use: No  . Drug Use: No  . Sexual Activity: No     Comment: post office worker   Other Topics Concern  . None   Social History Narrative   Family History  Problem Relation Age of Onset  . Cancer Father   . Diabetes Father      Medications Prior to Visit Current Outpatient Prescriptions on File Prior to Visit  Medication Sig Dispense Refill  . AMBULATORY NON FORMULARY MEDICATION Medication Name: Allergy Injection- once weekly    . Ascorbic Acid (VITAMIN C) 100 MG tablet Take 1,000 mg by mouth daily.    Marland Kitchen aspirin 81 MG tablet Take 81 mg by mouth daily.    Marland Kitchen azelastine (ASTELIN) 137 MCG/SPRAY nasal spray Place 1 spray into the nose 2 (two) times daily. Use in each nostril as directed    . B-D ULTRA-FINE 33 LANCETS MISC 1 each by Does not apply route as directed. 100 each 6  . beclomethasone (QVAR) 80 MCG/ACT inhaler Inhale 1 puff into the lungs as needed.    . Biotin 1000 MCG tablet Take one  tablet once daily    . Calcium Carbonate-Vitamin D (CALTRATE 600+D) 600-400 MG-UNIT per chew tablet Chew 1 tablet by mouth daily. Chew one tablet once a day    . EPIPEN 2-PAK 0.3 MG/0.3ML SOAJ injection     . fexofenadine (ALLEGRA) 180 MG tablet Take 180 mg by mouth daily.    . fluticasone (FLONASE) 50 MCG/ACT nasal spray     . fluticasone (VERAMYST) 27.5 MCG/SPRAY nasal spray Place 2 sprays into the nose 2 (two) times daily.    Marland Kitchen glucose blood (ACCU-CHEK AVIVA PLUS) test strip Check blood sugar once daily as instructed DX E11.9 100 each 11  . loratadine  (ALAVERT) 10 MG tablet Take 10 mg by mouth daily.    . metFORMIN (GLUCOPHAGE) 500 MG tablet Take 1 tablet (500 mg total) by mouth every other day. 90 tablet 1  . Multiple Vitamin (MULTIVITAMIN) tablet Take 1 tablet by mouth daily. Take one tablet once a day    . omeprazole (PRILOSEC) 10 MG capsule TAKE ONE CAPSULE BY MOUTH ONCE DAILY 30 capsule 5  . simvastatin (ZOCOR) 20 MG tablet Take 1 tablet (20 mg total) by mouth at bedtime. 90 tablet 1  . traMADol (ULTRAM) 50 MG tablet Take 50 mg by mouth every 8 (eight) hours as needed for pain (take one tab every 8-12 hour as needed for pain).    . triamterene-hydrochlorothiazide (MAXZIDE) 75-50 MG per tablet TAKE 1 TABLET EVERY DAY FOR BLOOD PRESSURE 90 tablet 3  . vitamin E (VITAMIN E) 400 UNIT capsule Take 400 Units by mouth daily.     No current facility-administered medications on file prior to visit.    Current Medications (verified) Current Outpatient Prescriptions  Medication Sig Dispense Refill  . AMBULATORY NON FORMULARY MEDICATION Medication Name: Allergy Injection- once weekly    . Ascorbic Acid (VITAMIN C) 100 MG tablet Take 1,000 mg by mouth daily.    Marland Kitchen aspirin 81 MG tablet Take 81 mg by mouth daily.    Marland Kitchen azelastine (ASTELIN) 137 MCG/SPRAY nasal spray Place 1 spray into the nose 2 (two) times daily. Use in each nostril as directed    . B-D ULTRA-FINE 33 LANCETS MISC 1 each by Does not apply route as directed. 100 each 6  . beclomethasone (QVAR) 80 MCG/ACT inhaler Inhale 1 puff into the lungs as needed.    . Biotin 1000 MCG tablet Take one tablet once daily    . Calcium Carbonate-Vitamin D (CALTRATE 600+D) 600-400 MG-UNIT per chew tablet Chew 1 tablet by mouth daily. Chew one tablet once a day    . EPIPEN 2-PAK 0.3 MG/0.3ML SOAJ injection     . fexofenadine (ALLEGRA) 180 MG tablet Take 180 mg by mouth daily.    . fluticasone (FLONASE) 50 MCG/ACT nasal spray     . fluticasone (VERAMYST) 27.5 MCG/SPRAY nasal spray Place 2 sprays into the  nose 2 (two) times daily.    Marland Kitchen glucose blood (ACCU-CHEK AVIVA PLUS) test strip Check blood sugar once daily as instructed DX E11.9 100 each 11  . loratadine (ALAVERT) 10 MG tablet Take 10 mg by mouth daily.    . metFORMIN (GLUCOPHAGE) 500 MG tablet Take 1 tablet (500 mg total) by mouth every other day. 90 tablet 1  . Multiple Vitamin (MULTIVITAMIN) tablet Take 1 tablet by mouth daily. Take one tablet once a day    . omeprazole (PRILOSEC) 10 MG capsule TAKE ONE CAPSULE BY MOUTH ONCE DAILY 30 capsule 5  . simvastatin (ZOCOR) 20 MG  tablet Take 1 tablet (20 mg total) by mouth at bedtime. 90 tablet 1  . traMADol (ULTRAM) 50 MG tablet Take 50 mg by mouth every 8 (eight) hours as needed for pain (take one tab every 8-12 hour as needed for pain).    . triamterene-hydrochlorothiazide (MAXZIDE) 75-50 MG per tablet TAKE 1 TABLET EVERY DAY FOR BLOOD PRESSURE 90 tablet 3  . vitamin E (VITAMIN E) 400 UNIT capsule Take 400 Units by mouth daily.     No current facility-administered medications for this visit.     Allergies (verified) Oxycodone and Sulfur   PAST HISTORY  Family History Family History  Problem Relation Age of Onset  . Cancer Father   . Diabetes Father     Social History History  Substance Use Topics  . Smoking status: Never Smoker   . Smokeless tobacco: Never Used  . Alcohol Use: No     Are there smokers in your home (other than you)? no  Risk Factors Current exercise habits: Home exercise routine includes treadmill and stationary bike.  Dietary issues discussed: healthy food choices  Cardiac risk factors: diabetes mellitus, dyslipidemia, hypertension and obesity (BMI >= 30 kg/m2).  Depression Screen  Over the past 2 weeks, have you felt down, depressed or hopeless? No  Over the past 2 weeks, have you felt little interest or pleasure in doing things? No   Have you lost interest or pleasure in daily life? no  Do you often feel hopeless? No  Do you cry easily over simple  problems? No   Activities of Daily Living In your present state of health, do you have any difficulty performing the following activities?:  Driving? NO Managing money?  NO Feeding yourself? NO Getting from bed to chair? NO Climbing a flight of stairs? YES -slow Preparing food and eating?: NO Bathing or showering? NO Getting dressed:NO Getting to the toilet? NO Using the toiletNO Moving around from place to place:NO In the past year have you fallen or had a near fall?:NO   Are you sexually active?   no  Do you have more than one partner?  NA  Hearing Difficulties:  Do you often ask people to speak up or repeat themselves? NO Do you experience ringing or noises in your ears?  yes Do you have difficulty understanding soft or whispered voices? yes - some times   Do you feel that you have a problem with memory?  no   Do you often misplace items?  no   Do you feel safe at home?  yes  Cognitive Testing    Advanced Directives have been discussed with the patient? yes  List the Names of Other Physician/Practitioners you currently use: 1.  Dr Leo Grosser (GYN) 2.  Dr Percell Miller (ortho) 3.  Dr Donneta Romberg (Allergist)  Indicate any recent Medical Services you may have received from other than [facility] providers in the past year (date may be approximate).  none  Immunization History  Administered Date(s) Administered  . Influenza,inj,Quad PF,36+ Mos 02/08/2013  . Pneumococcal Polysaccharide-23 05/03/2013  . Td 08/04/2011    Screening Tests Health Maintenance  Topic Date Due  . HIV Screening  10/12/1968  . ZOSTAVAX  08/2014 at [facility]  . INFLUENZA VACCINE  12/25/2014  . OPHTHALMOLOGY EXAM  01/26/2015  . HEMOGLOBIN A1C  02/11/2015  . URINE MICROALBUMIN  02/14/2015  . FOOT EXAM  02/15/2015  . MAMMOGRAM  03/10/2015  . PAP SMEAR  10/12/2016  . PNEUMOCOCCAL POLYSACCHARIDE VACCINE (2) 05/03/2018  . COLONOSCOPY  03/11/2020  . TETANUS/TDAP  08/03/2021    All answers were reviewed with the  patient and necessary referrals were made:  [name], DO   10/20/2014   History reviewed: allergies, current medications, past family history, past medical history, past social history, past surgical history and problem list  Review of Systems A comprehensive review of systems was negative except for: Ears, nose, mouth, throat, and face: positive for epistaxis, hearing loss, nasal congestion, sore mouth and tinnitus Endocrine: positive for BS some times fluctuates. 163 reading 2 days ago but 90 today. no low BS reactions Allergic/Immunologic: positive for hay fever    Objective:     Vision by Snellen chart: right LKJ:ZPHXTAV declines measurement, left WPV:XYIAXKP declines measurement . She was seen by eye specialist in 01/2014.  Body mass index is 40.12 kg/(m^2). BP 116/78 mmHg  Pulse 64  Temp(Src) 98.4 F (36.9 C) (Oral)  Resp 18  Ht 5\' 9"  (1.753 m)  Wt 271 lb 12.8 oz (123.288 kg)  BMI 40.12 kg/m2  SpO2 96%  Physical Exam  Constitutional: She is oriented to person, place, and time and well-developed, well-nourished, and in no distress.  HENT:  Head: Normocephalic and atraumatic.  Right Ear: External ear normal.  Left Ear: External ear normal.  Nose: Mucosal edema present.  Mouth/Throat: Oropharynx is clear and moist. No oropharyngeal exudate.  Nares with enlarged grey turbinates; oropharynx cobblestoning but no redness or exudate  Eyes: Conjunctivae and EOM are normal. Pupils are equal, round, and reactive to light. No scleral icterus.  Neck: Normal range of motion. Neck supple. No tracheal deviation present. No thyromegaly present.  Cardiovascular: Normal rate, regular rhythm, normal heart sounds and intact distal pulses.  Exam reveals no gallop and no friction rub.   No murmur heard. Trace LE edema b/l. No calf TTP  Pulmonary/Chest: Effort normal and breath sounds normal. She has no wheezes. She has no rales. She exhibits no tenderness. Right breast exhibits no  inverted nipple, no mass, no nipple discharge, no skin change and no tenderness. Left breast exhibits no inverted nipple, no mass, no nipple discharge, no skin change and no tenderness. Breasts are symmetrical.  Abdominal: Soft. Bowel sounds are normal. She exhibits no distension and no mass. There is no hepatosplenomegaly. There is no tenderness. There is no rebound and no guarding.  Genitourinary:  Deferred to GYN  Musculoskeletal: She exhibits edema and tenderness.  Markedly reduced left shoulder ROM  Lymphadenopathy:    She has no cervical adenopathy.  Neurological: She is alert and oriented to person, place, and time. She displays abnormal reflex (reduced in knee b/l). No cranial nerve deficit.  Skin: Skin is warm and dry. No rash noted.  Psychiatric: Mood, memory, affect and judgment normal.     Physical Exam  Constitutional: She is oriented to person, place, and time and well-developed, well-nourished, and in no distress.  HENT:  Head: Normocephalic and atraumatic.  Right Ear: External ear normal.  Left Ear: External ear normal.  Nose: Mucosal edema present.  Mouth/Throat: Oropharynx is clear and moist. No oropharyngeal exudate.  Nares with enlarged grey turbinates; oropharynx cobblestoning but no redness or exudate  Eyes: Conjunctivae and EOM are normal. Pupils are equal, round, and reactive to light. No scleral icterus.  Neck: Normal range of motion. Neck supple. No tracheal deviation present. No thyromegaly present.  Cardiovascular: Normal rate, regular rhythm, normal heart sounds and intact distal pulses.  Exam reveals no gallop and no friction rub.   No murmur heard.  Trace LE edema b/l. No calf TTP  Pulmonary/Chest: Effort normal and breath sounds normal. She has no wheezes. She has no rales. She exhibits no tenderness. Right breast exhibits no inverted nipple, no mass, no nipple discharge, no skin change and no tenderness. Left breast exhibits no inverted nipple, no mass, no  nipple discharge, no skin change and no tenderness. Breasts are symmetrical.  Abdominal: Soft. Bowel sounds are normal. She exhibits no distension and no mass. There is no hepatosplenomegaly. There is no tenderness. There is no rebound and no guarding.  Genitourinary:  Deferred to GYN  Musculoskeletal: She exhibits edema and tenderness.  Markedly reduced left shoulder ROM  Lymphadenopathy:    She has no cervical adenopathy.  Neurological: She is alert and oriented to person, place, and time. She displays abnormal reflex (reduced in knee b/l). No cranial nerve deficit.  Skin: Skin is warm and dry. No rash noted.  Psychiatric: Mood, memory, affect and judgment normal.      Recent Results (from the past 2160 hour(s))  CMP     Status: None   Collection Time: 08/11/14 11:17 AM  Result Value Ref Range   Glucose 98 65 - 99 mg/dL   BUN 10 8 - 27 mg/dL   Creatinine, Ser 0.72 0.57 - 1.00 mg/dL   GFR calc non Af Amer 91 >59 mL/min/1.73   GFR calc Af Amer 105 >59 mL/min/1.73   BUN/Creatinine Ratio 14 11 - 26   Sodium 139 134 - 144 mmol/L   Potassium 3.8 3.5 - 5.2 mmol/L   Chloride 97 97 - 108 mmol/L   CO2 23 18 - 29 mmol/L   Calcium 9.8 8.7 - 10.3 mg/dL   Total Protein 6.9 6.0 - 8.5 g/dL   Albumin 4.6 3.6 - 4.8 g/dL   Globulin, Total 2.3 1.5 - 4.5 g/dL   Albumin/Globulin Ratio 2.0 1.1 - 2.5   Bilirubin Total 0.4 0.0 - 1.2 mg/dL   Alkaline Phosphatase 69 39 - 117 IU/L   AST 20 0 - 40 IU/L   ALT 16 0 - 32 IU/L  Lipid Panel     Status: Abnormal   Collection Time: 08/11/14 11:17 AM  Result Value Ref Range   Cholesterol, Total 192 100 - 199 mg/dL   Triglycerides 125 0 - 149 mg/dL   HDL 54 >39 mg/dL    Comment: According to ATP-III Guidelines, HDL-C >59 mg/dL is considered a negative risk factor for CHD.    VLDL Cholesterol Cal 25 5 - 40 mg/dL   LDL Calculated 113 (H) 0 - 99 mg/dL   Chol/HDL Ratio 3.6 0.0 - 4.4 ratio units    Comment:                                   T. Chol/HDL  Ratio                                             Men  Women                               1/2 Avg.Risk  3.4    3.3  Avg.Risk  5.0    4.4                                2X Avg.Risk  9.6    7.1                                3X Avg.Risk 23.4   11.0   CBC with Differential     Status: Abnormal   Collection Time: 08/11/14 11:17 AM  Result Value Ref Range   WBC 7.3 3.4 - 10.8 x10E3/uL   RBC 5.00 3.77 - 5.28 x10E6/uL   Hemoglobin 13.5 11.1 - 15.9 g/dL   HCT 41.3 34.0 - 46.6 %   MCV 83 79 - 97 fL   MCH 27.0 26.6 - 33.0 pg   MCHC 32.7 31.5 - 35.7 g/dL   RDW 14.9 12.3 - 15.4 %   Platelets 391 (H) 150 - 379 x10E3/uL   Neutrophils Relative % 64 %   Lymphs 23 %   Monocytes 9 %   Eos 4 %   Basos 0 %   Neutrophils Absolute 4.7 1.4 - 7.0 x10E3/uL   Lymphocytes Absolute 1.7 0.7 - 3.1 x10E3/uL   Monocytes Absolute 0.6 0.1 - 0.9 x10E3/uL   Eosinophils Absolute 0.3 0.0 - 0.4 x10E3/uL   Basophils Absolute 0.0 0.0 - 0.2 x10E3/uL   Immature Granulocytes 0 %   Immature Grans (Abs) 0.0 0.0 - 0.1 x10E3/uL  TSH     Status: None   Collection Time: 08/11/14 11:17 AM  Result Value Ref Range   TSH 2.720 0.450 - 4.500 uIU/mL  Hemoglobin A1c     Status: Abnormal   Collection Time: 08/11/14 11:17 AM  Result Value Ref Range   Hgb A1c MFr Bld 6.0 (H) 4.8 - 5.6 %    Comment:          Pre-diabetes: 5.7 - 6.4          Diabetes: >6.4          Glycemic control for adults with diabetes: <7.0    Est. average glucose Bld gHb Est-mCnc 126 mg/dL    Assessment:       ICD-9-CM ICD-10-CM   1. Well adult exam V70.0 Z00.00   2. Allergic rhinitis due to pollen 477.0 J30.1   3. DM w/o complication type II 732.20 E11.9 CMP     Hemoglobin A1c     Microalbumin/Creatinine Ratio, Urine  4. Hyperlipidemia LDL goal <100 272.4 E78.5 Lipid Panel  5. Essential hypertension, benign 401.1 I10   6. Generalized osteoarthrosis, involving multiple sites 715.09 M15.9   7. Morbid obesity 278.01  E66.01         Plan:     During the course of the visit the patient was educated and counseled about appropriate screening and preventive services. Pt is UTD on health maintenance. Vaccinations are UTD. Pt maintains a healthy lifestyle. Encouraged pt to exercise 30-45 minutes 4-5 times per week. Eat a well balanced diet. Avoid smoking. Limit alcohol intake. Wear seatbelt when riding in the car. Wear sun block (SPF >50) when spending extended times outside.     Diet review for nutrition referral? Yes ____  Not Indicated _x___   Patient Instructions (the written plan) was given to the patient.  Continue current medications as ordered  Follow up in 4 mos for routine visit. Fasting labs prior to  appt  Medicare Attestation I have personally reviewed: The patient's medical and social history Their use of alcohol, tobacco or illicit drugs Their current medications and supplements The patient's functional ability including ADLs,fall risks, home safety risks, cognitive, and hearing and visual impairment Diet and physical activities Evidence for depression or mood disorders  The patient's weight, height and BMI have been recorded in the chart.  I have made referrals, counseling, and provided education to the patient based on review of the above and I have provided the patient with a written personalized care plan for preventive services.     [name], DO   10/20/2014       Adriann Thau S. Perlie Gold  [facility] [address] [place], [place] [address] [phone] Cell (Monday-Friday 8 AM - 5 PM) [phone] After 5 PM and follow prompts

## 2014-10-20 NOTE — Patient Instructions (Addendum)
Encouraged her to exercise 30-45 minutes 4-5 times per week. Eat a well balanced diet. Avoid smoking. Limit alcohol intake. Wear seatbelt when riding in the car. Wear sun block (SPF >50) when spending extended times outside.  Continue current medications as ordered  Follow up in 4 mos for routine visit. Fasting labs prior to appt

## 2014-10-21 DIAGNOSIS — E785 Hyperlipidemia, unspecified: Secondary | ICD-10-CM | POA: Insufficient documentation

## 2014-10-21 DIAGNOSIS — I10 Essential (primary) hypertension: Secondary | ICD-10-CM | POA: Insufficient documentation

## 2014-10-24 DIAGNOSIS — M7502 Adhesive capsulitis of left shoulder: Secondary | ICD-10-CM | POA: Diagnosis not present

## 2014-10-24 DIAGNOSIS — J301 Allergic rhinitis due to pollen: Secondary | ICD-10-CM | POA: Diagnosis not present

## 2014-10-24 DIAGNOSIS — J3089 Other allergic rhinitis: Secondary | ICD-10-CM | POA: Diagnosis not present

## 2014-10-30 DIAGNOSIS — J3089 Other allergic rhinitis: Secondary | ICD-10-CM | POA: Diagnosis not present

## 2014-10-30 DIAGNOSIS — J301 Allergic rhinitis due to pollen: Secondary | ICD-10-CM | POA: Diagnosis not present

## 2014-11-06 DIAGNOSIS — J301 Allergic rhinitis due to pollen: Secondary | ICD-10-CM | POA: Diagnosis not present

## 2014-11-06 DIAGNOSIS — J3089 Other allergic rhinitis: Secondary | ICD-10-CM | POA: Diagnosis not present

## 2014-11-07 ENCOUNTER — Other Ambulatory Visit: Payer: Self-pay | Admitting: Internal Medicine

## 2014-11-13 DIAGNOSIS — J3089 Other allergic rhinitis: Secondary | ICD-10-CM | POA: Diagnosis not present

## 2014-11-13 DIAGNOSIS — J301 Allergic rhinitis due to pollen: Secondary | ICD-10-CM | POA: Diagnosis not present

## 2014-11-16 DIAGNOSIS — Z1231 Encounter for screening mammogram for malignant neoplasm of breast: Secondary | ICD-10-CM | POA: Diagnosis not present

## 2014-11-16 DIAGNOSIS — Z6841 Body Mass Index (BMI) 40.0 and over, adult: Secondary | ICD-10-CM | POA: Diagnosis not present

## 2014-11-16 DIAGNOSIS — E119 Type 2 diabetes mellitus without complications: Secondary | ICD-10-CM | POA: Diagnosis not present

## 2014-11-16 DIAGNOSIS — R8290 Unspecified abnormal findings in urine: Secondary | ICD-10-CM | POA: Diagnosis not present

## 2014-11-16 DIAGNOSIS — M8580 Other specified disorders of bone density and structure, unspecified site: Secondary | ICD-10-CM | POA: Diagnosis not present

## 2014-11-16 DIAGNOSIS — Z01419 Encounter for gynecological examination (general) (routine) without abnormal findings: Secondary | ICD-10-CM | POA: Diagnosis not present

## 2014-11-20 DIAGNOSIS — J301 Allergic rhinitis due to pollen: Secondary | ICD-10-CM | POA: Diagnosis not present

## 2014-11-20 DIAGNOSIS — J3089 Other allergic rhinitis: Secondary | ICD-10-CM | POA: Diagnosis not present

## 2014-11-28 DIAGNOSIS — J301 Allergic rhinitis due to pollen: Secondary | ICD-10-CM | POA: Diagnosis not present

## 2014-11-28 DIAGNOSIS — J3089 Other allergic rhinitis: Secondary | ICD-10-CM | POA: Diagnosis not present

## 2014-12-04 DIAGNOSIS — J3089 Other allergic rhinitis: Secondary | ICD-10-CM | POA: Diagnosis not present

## 2014-12-04 DIAGNOSIS — J301 Allergic rhinitis due to pollen: Secondary | ICD-10-CM | POA: Diagnosis not present

## 2014-12-05 DIAGNOSIS — M7062 Trochanteric bursitis, left hip: Secondary | ICD-10-CM | POA: Diagnosis not present

## 2014-12-05 DIAGNOSIS — M25512 Pain in left shoulder: Secondary | ICD-10-CM | POA: Diagnosis not present

## 2014-12-11 DIAGNOSIS — J3089 Other allergic rhinitis: Secondary | ICD-10-CM | POA: Diagnosis not present

## 2014-12-11 DIAGNOSIS — J301 Allergic rhinitis due to pollen: Secondary | ICD-10-CM | POA: Diagnosis not present

## 2014-12-18 DIAGNOSIS — J301 Allergic rhinitis due to pollen: Secondary | ICD-10-CM | POA: Diagnosis not present

## 2014-12-18 DIAGNOSIS — J3089 Other allergic rhinitis: Secondary | ICD-10-CM | POA: Diagnosis not present

## 2014-12-25 DIAGNOSIS — J3089 Other allergic rhinitis: Secondary | ICD-10-CM | POA: Diagnosis not present

## 2014-12-25 DIAGNOSIS — J301 Allergic rhinitis due to pollen: Secondary | ICD-10-CM | POA: Diagnosis not present

## 2014-12-30 ENCOUNTER — Other Ambulatory Visit: Payer: Self-pay | Admitting: Internal Medicine

## 2015-01-01 DIAGNOSIS — J3089 Other allergic rhinitis: Secondary | ICD-10-CM | POA: Diagnosis not present

## 2015-01-01 DIAGNOSIS — J301 Allergic rhinitis due to pollen: Secondary | ICD-10-CM | POA: Diagnosis not present

## 2015-01-08 DIAGNOSIS — J3089 Other allergic rhinitis: Secondary | ICD-10-CM | POA: Diagnosis not present

## 2015-01-08 DIAGNOSIS — J301 Allergic rhinitis due to pollen: Secondary | ICD-10-CM | POA: Diagnosis not present

## 2015-01-15 DIAGNOSIS — J301 Allergic rhinitis due to pollen: Secondary | ICD-10-CM | POA: Diagnosis not present

## 2015-01-15 DIAGNOSIS — J3089 Other allergic rhinitis: Secondary | ICD-10-CM | POA: Diagnosis not present

## 2015-01-22 DIAGNOSIS — J3089 Other allergic rhinitis: Secondary | ICD-10-CM | POA: Diagnosis not present

## 2015-01-22 DIAGNOSIS — J301 Allergic rhinitis due to pollen: Secondary | ICD-10-CM | POA: Diagnosis not present

## 2015-01-30 DIAGNOSIS — J3089 Other allergic rhinitis: Secondary | ICD-10-CM | POA: Diagnosis not present

## 2015-01-30 DIAGNOSIS — J301 Allergic rhinitis due to pollen: Secondary | ICD-10-CM | POA: Diagnosis not present

## 2015-02-05 DIAGNOSIS — J301 Allergic rhinitis due to pollen: Secondary | ICD-10-CM | POA: Diagnosis not present

## 2015-02-05 DIAGNOSIS — J3089 Other allergic rhinitis: Secondary | ICD-10-CM | POA: Diagnosis not present

## 2015-02-12 DIAGNOSIS — J3089 Other allergic rhinitis: Secondary | ICD-10-CM | POA: Diagnosis not present

## 2015-02-12 DIAGNOSIS — J301 Allergic rhinitis due to pollen: Secondary | ICD-10-CM | POA: Diagnosis not present

## 2015-02-16 DIAGNOSIS — J3089 Other allergic rhinitis: Secondary | ICD-10-CM | POA: Diagnosis not present

## 2015-02-16 DIAGNOSIS — J4530 Mild persistent asthma, uncomplicated: Secondary | ICD-10-CM | POA: Diagnosis not present

## 2015-02-16 DIAGNOSIS — J301 Allergic rhinitis due to pollen: Secondary | ICD-10-CM | POA: Diagnosis not present

## 2015-02-19 ENCOUNTER — Other Ambulatory Visit: Payer: Medicare Other

## 2015-02-19 DIAGNOSIS — E119 Type 2 diabetes mellitus without complications: Secondary | ICD-10-CM

## 2015-02-19 DIAGNOSIS — J301 Allergic rhinitis due to pollen: Secondary | ICD-10-CM | POA: Diagnosis not present

## 2015-02-19 DIAGNOSIS — E785 Hyperlipidemia, unspecified: Secondary | ICD-10-CM | POA: Diagnosis not present

## 2015-02-19 DIAGNOSIS — J3089 Other allergic rhinitis: Secondary | ICD-10-CM | POA: Diagnosis not present

## 2015-02-20 LAB — COMPREHENSIVE METABOLIC PANEL
ALT: 14 IU/L (ref 0–32)
AST: 20 IU/L (ref 0–40)
Albumin/Globulin Ratio: 1.7 (ref 1.1–2.5)
Albumin: 4.6 g/dL (ref 3.6–4.8)
Alkaline Phosphatase: 61 IU/L (ref 39–117)
BUN/Creatinine Ratio: 12 (ref 11–26)
BUN: 9 mg/dL (ref 8–27)
Bilirubin Total: 0.6 mg/dL (ref 0.0–1.2)
CHLORIDE: 98 mmol/L (ref 97–108)
CO2: 23 mmol/L (ref 18–29)
Calcium: 9.7 mg/dL (ref 8.7–10.3)
Creatinine, Ser: 0.78 mg/dL (ref 0.57–1.00)
GFR calc Af Amer: 95 mL/min/{1.73_m2} (ref >59)
GFR calc non Af Amer: 82 mL/min/{1.73_m2} (ref >59)
Globulin, Total: 2.7 g/dL (ref 1.5–4.5)
Glucose: 99 mg/dL (ref 65–99)
Potassium: 3.7 mmol/L (ref 3.5–5.2)
Sodium: 142 mmol/L (ref 134–144)
Total Protein: 7.3 g/dL (ref 6.0–8.5)

## 2015-02-20 LAB — MICROALBUMIN / CREATININE URINE RATIO
Creatinine, Urine: 116.5 mg/dL
MICROALB/CREAT RATIO: 9.1 mg/g creat (ref 0.0–30.0)
Microalbumin, Urine: 10.6 ug/mL

## 2015-02-20 LAB — LIPID PANEL
CHOLESTEROL TOTAL: 161 mg/dL (ref 100–199)
Chol/HDL Ratio: 2.8 ratio units (ref 0.0–4.4)
HDL: 57 mg/dL (ref >39)
LDL CALC: 85 mg/dL (ref 0–99)
Triglycerides: 95 mg/dL (ref 0–149)
VLDL CHOLESTEROL CAL: 19 mg/dL (ref 5–40)

## 2015-02-20 LAB — HEMOGLOBIN A1C
ESTIMATED AVERAGE GLUCOSE: 128 mg/dL
Hgb A1c MFr Bld: 6.1 % — ABNORMAL HIGH (ref 4.8–5.6)

## 2015-02-23 ENCOUNTER — Encounter: Payer: Self-pay | Admitting: Internal Medicine

## 2015-02-23 ENCOUNTER — Ambulatory Visit (INDEPENDENT_AMBULATORY_CARE_PROVIDER_SITE_OTHER): Payer: Medicare Other | Admitting: Internal Medicine

## 2015-02-23 VITALS — BP 112/76 | HR 93 | Temp 98.4°F | Resp 20 | Ht 69.0 in | Wt 267.8 lb

## 2015-02-23 DIAGNOSIS — E119 Type 2 diabetes mellitus without complications: Secondary | ICD-10-CM

## 2015-02-23 DIAGNOSIS — J301 Allergic rhinitis due to pollen: Secondary | ICD-10-CM | POA: Diagnosis not present

## 2015-02-23 DIAGNOSIS — I10 Essential (primary) hypertension: Secondary | ICD-10-CM | POA: Diagnosis not present

## 2015-02-23 DIAGNOSIS — Z23 Encounter for immunization: Secondary | ICD-10-CM | POA: Diagnosis not present

## 2015-02-23 DIAGNOSIS — E785 Hyperlipidemia, unspecified: Secondary | ICD-10-CM | POA: Diagnosis not present

## 2015-02-23 MED ORDER — SIMVASTATIN 20 MG PO TABS: 20.0000 mg | ORAL_TABLET | Freq: Every day | ORAL | Status: DC

## 2015-02-23 NOTE — Patient Instructions (Signed)
Flu shot given today  Continue current medications as ordered  Follow up in 4 mos for routine visit. Fasting labs 2-3 days prior

## 2015-02-23 NOTE — Progress Notes (Signed)
Patient ID: Cheryl [name], female   DOB: 11/05/1955, 59 y.o.   MRN: [id number]    Location:    [facility]   Place of Service:   OFFICE  Chief Complaint  Patient presents with  . Medical Management of Chronic Issues    4 month follow-up for Hypertension, Hyperlipidemia    HPI:  59 yo female seen today for f/u. She is exercising on exercise bike or walking 4-5 miles per day.  HTN - stable on maxzide. Takes ASA daily  DM - BS <100 most days. No low BS reactions. Occasional numbness/tingling in ands/feet. She takes metformin  GERD - stable on omeprazole  allergic rhinitis/asthma - she reports increased issues with seasonal allergy. She is taking shots, allegra, using nasal flonase and veramyst, Qvar inhaler. She has not tried nasal saline spray. Also using ginger and lemon tea. She feels nauseated form post nasal drip.  Arthritis - pain stable on prn tramadol. She sees Dr Percell Miller (ortho)  Past Medical History  Diagnosis Date  . Hypertension   . Diabetes mellitus without complication     diet- controlled  . Asthma   . GERD (gastroesophageal reflux disease)   . Arthritis   . Impaired fasting glucose   . Morbid obesity   . Carpal tunnel syndrome   . Other specified cardiac dysrhythmias(427.89)   . Symptomatic menopausal or female climacteric states   . Allergic rhinitis, cause unspecified   . Pain in joint, site unspecified   . Other malaise and fatigue     Past Surgical History  Procedure Laterality Date  . Carpal tunnel both hands      2012  . Joint replacement      both knees replacement, 2010  . Abdominal hysterectomy      1994  . Nodule removed from back    . Mole removed from face    . Tonsillectomy      as teenager  . Shoulder surgery Left 11/2013  . Closed manipulation shoulder Left 04/2014    Patient Care Team: [name], DO as PCP - General (Internal Medicine)  Social History   Social History  . Marital Status: Single    Spouse Name: N/A  . Number of  Children: N/A  . Years of Education: N/A   Occupational History  . Not on file.   Social History Main Topics  . Smoking status: Never Smoker   . Smokeless tobacco: Never Used  . Alcohol Use: No  . Drug Use: No  . Sexual Activity: No     Comment: post office worker   Other Topics Concern  . Not on file   Social History Narrative     reports that she has never smoked. She has never used smokeless tobacco. She reports that she does not drink alcohol or use illicit drugs.  Allergies  Allergen Reactions  . Oxycodone     Had stomach and headache as side effect from medicine  . Sulfur Diarrhea    Medications: Patient's Medications  New Prescriptions   No medications on file  Previous Medications   AMBULATORY NON FORMULARY MEDICATION    Medication Name: Allergy Injection- once weekly   ASCORBIC ACID (VITAMIN C) 100 MG TABLET    Take 1,000 mg by mouth daily.   ASPIRIN 81 MG TABLET    Take 81 mg by mouth daily.   AZELASTINE (ASTELIN) 137 MCG/SPRAY NASAL SPRAY    Place 1 spray into the nose 2 (two) times daily. Use in each  nostril as directed   B-D ULTRA-FINE 33 LANCETS MISC    1 each by Does not apply route as directed.   BECLOMETHASONE (QVAR) 80 MCG/ACT INHALER    Inhale 1 puff into the lungs as needed.   BIOTIN 1000 MCG TABLET    Take one tablet once daily   CALCIUM CARBONATE-VITAMIN D (CALTRATE 600+D) 600-400 MG-UNIT PER CHEW TABLET    Chew 1 tablet by mouth daily. Chew one tablet once a day   EPIPEN 2-PAK 0.3 MG/0.3ML SOAJ INJECTION       FEXOFENADINE (ALLEGRA) 180 MG TABLET    Take 180 mg by mouth daily.   FLUTICASONE (FLONASE) 50 MCG/ACT NASAL SPRAY       FLUTICASONE (VERAMYST) 27.5 MCG/SPRAY NASAL SPRAY    Place 2 sprays into the nose 2 (two) times daily.   GLUCOSE BLOOD (ACCU-CHEK AVIVA PLUS) TEST STRIP    Check blood sugar once daily as instructed DX E11.9   LORATADINE (ALAVERT) 10 MG TABLET    Take 10 mg by mouth daily.   METFORMIN (GLUCOPHAGE) 500 MG TABLET    Take 1  tablet (500 mg total) by mouth every other day.   MULTIPLE VITAMIN (MULTIVITAMIN) TABLET    Take 1 tablet by mouth daily. Take one tablet once a day   OMEPRAZOLE (PRILOSEC) 10 MG CAPSULE    TAKE ONE CAPSULE BY MOUTH ONCE DAILY   SIMVASTATIN (ZOCOR) 10 MG TABLET    TAKE 1 TABLET EVERY DAY FOR CHOLESTEROL   SIMVASTATIN (ZOCOR) 20 MG TABLET    Take 1 tablet (20 mg total) by mouth at bedtime.   TRAMADOL (ULTRAM) 50 MG TABLET    Take 50 mg by mouth every 8 (eight) hours as needed for pain (take one tab every 8-12 hour as needed for pain).   TRIAMTERENE-HYDROCHLOROTHIAZIDE (MAXZIDE) 75-50 MG PER TABLET    TAKE 1 TABLET EVERY DAY FOR BLOOD PRESSURE   VITAMIN E (VITAMIN E) 400 UNIT CAPSULE    Take 400 Units by mouth daily.  Modified Medications   No medications on file  Discontinued Medications   No medications on file    Review of Systems  Constitutional: Negative for fever, chills, diaphoresis, activity change, appetite change and fatigue.  HENT: Positive for postnasal drip and sinus pressure. Negative for ear pain and sore throat.   Eyes: Negative for visual disturbance.  Respiratory: Positive for shortness of breath. Negative for cough and chest tightness.   Cardiovascular: Negative for chest pain, palpitations and leg swelling.  Gastrointestinal: Negative for nausea, vomiting, abdominal pain, diarrhea, constipation and blood in stool.  Genitourinary: Negative for dysuria.  Musculoskeletal: Positive for joint swelling and arthralgias.  Neurological: Negative for dizziness, tremors, numbness and headaches.  Psychiatric/Behavioral: Negative for sleep disturbance. The patient is not nervous/anxious.     Filed Vitals:   02/23/15 1529  BP: 112/76  Pulse: 93  Temp: 98.4 F (36.9 C)  TempSrc: Oral  Resp: 20  Height: 5\' 9"  (1.753 m)  Weight: 267 lb 12.8 oz (121.473 kg)  SpO2: 98%   Body mass index is 39.53 kg/(m^2).  Physical Exam  Constitutional: She is oriented to person, place, and  time. She appears well-developed and well-nourished.  HENT:  Mouth/Throat: Oropharynx is clear and moist. No oropharyngeal exudate.  Eyes: Pupils are equal, round, and reactive to light. No scleral icterus.  Neck: Neck supple. Carotid bruit is not present. No tracheal deviation present. No thyromegaly present.  Cardiovascular: Normal rate, regular rhythm, normal heart sounds and  intact distal pulses.  Exam reveals no gallop and no friction rub.   No murmur heard. Trace LE edema b/l. No calf TTP.   Pulmonary/Chest: Effort normal and breath sounds normal. No stridor. No respiratory distress. She has no wheezes. She has no rales.  Abdominal: Soft. Bowel sounds are normal. She exhibits no distension and no mass. There is no hepatomegaly. There is no tenderness. There is no rebound and no guarding.  Musculoskeletal: She exhibits edema and tenderness.  Lymphadenopathy:    She has no cervical adenopathy.  Neurological: She is alert and oriented to person, place, and time.  Skin: Skin is warm and dry. No rash noted.  No foot lesions noted b/l  Psychiatric: She has a normal mood and affect. Her behavior is normal. Judgment and thought content normal.     Labs reviewed: Appointment on 02/19/2015  Component Date Value Ref Range Status  . Glucose 02/19/2015 99  65 - 99 mg/dL Final  . BUN 02/19/2015 9  8 - 27 mg/dL Final  . Creatinine, Ser 02/19/2015 0.78  0.57 - 1.00 mg/dL Final  . GFR calc non Af Amer 02/19/2015 82  >59 mL/min/1.73 Final  . GFR calc Af Amer 02/19/2015 95  >59 mL/min/1.73 Final  . BUN/Creatinine Ratio 02/19/2015 12  11 - 26 Final  . Sodium 02/19/2015 142  134 - 144 mmol/L Final  . Potassium 02/19/2015 3.7  3.5 - 5.2 mmol/L Final  . Chloride 02/19/2015 98  97 - 108 mmol/L Final  . CO2 02/19/2015 23  18 - 29 mmol/L Final  . Calcium 02/19/2015 9.7  8.7 - 10.3 mg/dL Final  . Total Protein 02/19/2015 7.3  6.0 - 8.5 g/dL Final  . Albumin 02/19/2015 4.6  3.6 - 4.8 g/dL Final  .  Globulin, Total 02/19/2015 2.7  1.5 - 4.5 g/dL Final  . Albumin/Globulin Ratio 02/19/2015 1.7  1.1 - 2.5 Final  . Bilirubin Total 02/19/2015 0.6  0.0 - 1.2 mg/dL Final  . Alkaline Phosphatase 02/19/2015 61  39 - 117 IU/L Final  . AST 02/19/2015 20  0 - 40 IU/L Final  . ALT 02/19/2015 14  0 - 32 IU/L Final  . Cholesterol, Total 02/19/2015 161  100 - 199 mg/dL Final  . Triglycerides 02/19/2015 95  0 - 149 mg/dL Final  . HDL 02/19/2015 57  >39 mg/dL Final   Comment: According to ATP-III Guidelines, HDL-C >59 mg/dL is considered a negative risk factor for CHD.   Marland Kitchen VLDL Cholesterol Cal 02/19/2015 19  5 - 40 mg/dL Final  . LDL Calculated 02/19/2015 85  0 - 99 mg/dL Final  . Chol/HDL Ratio 02/19/2015 2.8  0.0 - 4.4 ratio units Final   Comment:                                   T. Chol/HDL Ratio                                             Men  Women                               1/2 Avg.Risk  3.4    3.3  Avg.Risk  5.0    4.4                                2X Avg.Risk  9.6    7.1                                3X Avg.Risk 23.4   11.0   . Hgb A1c MFr Bld 02/19/2015 6.1* 4.8 - 5.6 % Final   Comment:          Pre-diabetes: 5.7 - 6.4          Diabetes: >6.4          Glycemic control for adults with diabetes: <7.0   . Est. average glucose Bld gHb Est-m* 02/19/2015 128   Final  . Creatinine, Urine 02/19/2015 116.5  Not Estab. mg/dL Final  . Microalbum.,U,Random 02/19/2015 10.6  Not Estab. ug/mL Final  . MICROALB/CREAT RATIO 02/19/2015 9.1  0.0 - 30.0 mg/g creat Final    No results found.   Assessment/Plan   ICD-9-CM ICD-10-CM   1. Allergic rhinitis due to pollen - stable 477.0 J30.1   2. DM w/o complication type II - controlled 250.00 J28.7 Basic Metabolic Panel     ALT     Hemoglobin A1c  3. Essential hypertension, benign -stable 401.1 O67 Basic Metabolic Panel  4. Hyperlipidemia LDL goal <100 - stable 272.4 E78.5 Lipid Panel  5. Need for prophylactic  vaccination and inoculation against influenza V04.81 Z23 Flu Vaccine QUAD 36+ mos PF IM (Fluarix & Fluzone Quad PF)    Flu shot given today  Continue current medications as ordered  Follow up in 4 mos for routine visit. Fasting labs 2-3 days prior  Cheryl [name]  [facility] [address] [place], [place] [address] [phone] Cell (Monday-Friday 8 AM - 5 PM) [phone] After 5 PM and follow prompts

## 2015-02-26 DIAGNOSIS — J3089 Other allergic rhinitis: Secondary | ICD-10-CM | POA: Diagnosis not present

## 2015-02-26 DIAGNOSIS — J301 Allergic rhinitis due to pollen: Secondary | ICD-10-CM | POA: Diagnosis not present

## 2015-02-27 ENCOUNTER — Other Ambulatory Visit (HOSPITAL_BASED_OUTPATIENT_CLINIC_OR_DEPARTMENT_OTHER): Payer: Self-pay | Admitting: Internal Medicine

## 2015-03-05 DIAGNOSIS — J3089 Other allergic rhinitis: Secondary | ICD-10-CM | POA: Diagnosis not present

## 2015-03-05 DIAGNOSIS — J301 Allergic rhinitis due to pollen: Secondary | ICD-10-CM | POA: Diagnosis not present

## 2015-03-12 DIAGNOSIS — J301 Allergic rhinitis due to pollen: Secondary | ICD-10-CM | POA: Diagnosis not present

## 2015-03-12 DIAGNOSIS — J3089 Other allergic rhinitis: Secondary | ICD-10-CM | POA: Diagnosis not present

## 2015-03-19 DIAGNOSIS — J301 Allergic rhinitis due to pollen: Secondary | ICD-10-CM | POA: Diagnosis not present

## 2015-03-19 DIAGNOSIS — J3089 Other allergic rhinitis: Secondary | ICD-10-CM | POA: Diagnosis not present

## 2015-03-23 ENCOUNTER — Other Ambulatory Visit: Payer: Self-pay | Admitting: Internal Medicine

## 2015-03-26 DIAGNOSIS — J301 Allergic rhinitis due to pollen: Secondary | ICD-10-CM | POA: Diagnosis not present

## 2015-03-26 DIAGNOSIS — J3089 Other allergic rhinitis: Secondary | ICD-10-CM | POA: Diagnosis not present

## 2015-04-01 ENCOUNTER — Other Ambulatory Visit: Payer: Self-pay | Admitting: Internal Medicine

## 2015-04-02 DIAGNOSIS — J3089 Other allergic rhinitis: Secondary | ICD-10-CM | POA: Diagnosis not present

## 2015-04-02 DIAGNOSIS — J301 Allergic rhinitis due to pollen: Secondary | ICD-10-CM | POA: Diagnosis not present

## 2015-04-09 DIAGNOSIS — J3089 Other allergic rhinitis: Secondary | ICD-10-CM | POA: Diagnosis not present

## 2015-04-09 DIAGNOSIS — J301 Allergic rhinitis due to pollen: Secondary | ICD-10-CM | POA: Diagnosis not present

## 2015-04-12 ENCOUNTER — Encounter: Payer: Self-pay | Admitting: Nurse Practitioner

## 2015-04-12 ENCOUNTER — Ambulatory Visit (INDEPENDENT_AMBULATORY_CARE_PROVIDER_SITE_OTHER): Payer: Medicare Other | Admitting: Nurse Practitioner

## 2015-04-12 VITALS — BP 118/76 | HR 65 | Temp 98.6°F | Ht 69.0 in | Wt 269.8 lb

## 2015-04-12 DIAGNOSIS — R143 Flatulence: Secondary | ICD-10-CM | POA: Diagnosis not present

## 2015-04-12 DIAGNOSIS — E119 Type 2 diabetes mellitus without complications: Secondary | ICD-10-CM

## 2015-04-12 DIAGNOSIS — G609 Hereditary and idiopathic neuropathy, unspecified: Secondary | ICD-10-CM

## 2015-04-12 DIAGNOSIS — K219 Gastro-esophageal reflux disease without esophagitis: Secondary | ICD-10-CM | POA: Diagnosis not present

## 2015-04-12 MED ORDER — OMEPRAZOLE 10 MG PO CPDR: DELAYED_RELEASE_CAPSULE | ORAL | Status: DC

## 2015-04-12 MED ORDER — SIMVASTATIN 20 MG PO TABS: ORAL_TABLET | ORAL | Status: DC

## 2015-04-12 MED ORDER — TRIAMTERENE-HCTZ 75-50 MG PO TABS: ORAL_TABLET | ORAL | Status: DC

## 2015-04-12 MED ORDER — METFORMIN HCL 500 MG PO TABS: 500.0000 mg | ORAL_TABLET | ORAL | Status: DC

## 2015-04-12 MED ORDER — BECLOMETHASONE DIPROPIONATE 80 MCG/ACT IN AERS: 1.0000 | INHALATION_SPRAY | RESPIRATORY_TRACT | Status: DC | PRN

## 2015-04-12 MED ORDER — FLUTICASONE PROPIONATE 50 MCG/ACT NA SUSP: NASAL | Status: DC

## 2015-04-12 MED ORDER — FLUTICASONE FUROATE 27.5 MCG/SPRAY NA SUSP: 2.0000 | Freq: Two times a day (BID) | NASAL | Status: DC

## 2015-04-12 MED ORDER — FEXOFENADINE HCL 180 MG PO TABS: ORAL_TABLET | ORAL | Status: DC

## 2015-04-12 NOTE — Progress Notes (Signed)
Patient ID: Cheryl [name], female   DOB: December 22, 1955, 59 y.o.   MRN: [id number]    PCP: [name], DO   Allergies  Allergen Reactions  . Oxycodone     Had stomach and headache as side effect from medicine  . Sulfur Diarrhea    Chief Complaint  Patient presents with  . Acute Visit    Complains of lips burning, alot of gas, Nausea, Hungry all the time, fatigue and tingling in extremities. Has regular BM. Patient also wants all of her Rx sent to [facility] for mail order.      HPI: Patient is a 59 y.o. female seen in the office today for multiple concerns.  Blood sugars have been up in the morning. Normally around 100-121  Having bad gas pains and indigestion, waking up to burp at night. Starting drinking new drink to add flavor to her water Attempting to eat heart healthy diet. Increased hunger  Exercising daily which makes her feel better Ongoing neuropathy- not keeping her up at night but is worse at night.  Waking up in the middle of the night  Review of Systems:  Review of Systems  Constitutional: Positive for appetite change (increase appetite). Negative for activity change, fatigue and unexpected weight change.  HENT: Negative for congestion and hearing loss.   Eyes: Negative.   Respiratory: Negative for cough and shortness of breath.   Cardiovascular: Negative for chest pain, palpitations and leg swelling.  Gastrointestinal: Negative for nausea, vomiting, abdominal pain, diarrhea and constipation.       Increase indigestion and gas over last week  Genitourinary: Negative for dysuria and difficulty urinating.  Musculoskeletal: Negative for myalgias and arthralgias.  Skin: Negative for color change and wound.  Neurological: Positive for numbness (and tingling to LE). Negative for dizziness and weakness.  Psychiatric/Behavioral: Positive for sleep disturbance.    Past Medical History  Diagnosis Date  . Hypertension   . Diabetes mellitus without complication (HCC)    diet- controlled  . Asthma   . GERD (gastroesophageal reflux disease)   . Arthritis   . Impaired fasting glucose   . Morbid obesity ([place])   . Carpal tunnel syndrome   . Other specified cardiac dysrhythmias(427.89)   . Symptomatic menopausal or female climacteric states   . Allergic rhinitis, cause unspecified   . Pain in joint, site unspecified   . Other malaise and fatigue    Past Surgical History  Procedure Laterality Date  . Carpal tunnel both hands      2012  . Joint replacement      both knees replacement, 2010  . Abdominal hysterectomy      1994  . Nodule removed from back    . Mole removed from face    . Tonsillectomy      as teenager  . Shoulder surgery Left 11/2013  . Closed manipulation shoulder Left 04/2014   Social History:   reports that she has never smoked. She has never used smokeless tobacco. She reports that she does not drink alcohol or use illicit drugs.  Family History  Problem Relation Age of Onset  . Cancer Father   . Diabetes Father     Medications: Patient's Medications  New Prescriptions   No medications on file  Previous Medications   AMBULATORY NON FORMULARY MEDICATION    Medication Name: Allergy Injection- once weekly   ASCORBIC ACID (VITAMIN C) 100 MG TABLET    Take 1,000 mg by mouth daily.   ASPIRIN 81 MG  TABLET    Take 81 mg by mouth daily.   B-D ULTRA-FINE 33 LANCETS MISC    1 each by Does not apply route as directed.   BECLOMETHASONE (QVAR) 80 MCG/ACT INHALER    Inhale 1 puff into the lungs as needed.   CALCIUM CARBONATE-VITAMIN D (CALTRATE 600+D) 600-400 MG-UNIT PER CHEW TABLET    Chew 1 tablet by mouth daily. Chew one tablet once a day   EPIPEN 2-PAK 0.3 MG/0.3ML SOAJ INJECTION       FEXOFENADINE (ALLEGRA) 180 MG TABLET    Take 180 mg by mouth daily.   FLUTICASONE (FLONASE) 50 MCG/ACT NASAL SPRAY       FLUTICASONE (VERAMYST) 27.5 MCG/SPRAY NASAL SPRAY    Place 2 sprays into the nose 2 (two) times daily.   GLUCOSE BLOOD (ACCU-CHEK  AVIVA PLUS) TEST STRIP    Check blood sugar once daily as instructed DX E11.9   METFORMIN (GLUCOPHAGE) 500 MG TABLET    TAKE 1 TABLET BY MOUTH EVERY OTHER DAY   MULTIPLE VITAMIN (MULTIVITAMIN) TABLET    Take 1 tablet by mouth daily. Take one tablet once a day   OMEPRAZOLE (PRILOSEC) 10 MG CAPSULE    TAKE ONE CAPSULE BY MOUTH ONCE DAILY   SIMVASTATIN (ZOCOR) 20 MG TABLET    Take 1 tablet (20 mg total) by mouth at bedtime.   TRAMADOL (ULTRAM) 50 MG TABLET    Take 50 mg by mouth every 8 (eight) hours as needed for pain (take one tab every 8-12 hour as needed for pain).   TRIAMTERENE-HYDROCHLOROTHIAZIDE (MAXZIDE) 75-50 MG TABLET    TAKE 1 TABLET EVERY DAY FOR BLOOD PRESSURE   VITAMIN E (VITAMIN E) 400 UNIT CAPSULE    Take 400 Units by mouth daily.  Modified Medications   No medications on file  Discontinued Medications   BIOTIN 1000 MCG TABLET    Take one tablet once daily   LORATADINE (ALAVERT) 10 MG TABLET    Take 10 mg by mouth daily.   METFORMIN (GLUCOPHAGE) 500 MG TABLET    TAKE 1 TABLET BY MOUTH EVERY OTHER DAY     Physical Exam:  Filed Vitals:   04/12/15 1235  BP: 118/76  Pulse: 65  Temp: 98.6 F (37 C)  TempSrc: Oral  Height: 5\' 9"  (1.753 m)  Weight: 269 lb 12.8 oz (122.38 kg)   Body mass index is 39.82 kg/(m^2).  Physical Exam  Constitutional: She is oriented to person, place, and time. She appears well-developed and well-nourished. No distress.  HENT:  Head: Normocephalic and atraumatic.  Mouth/Throat: Oropharynx is clear and moist. No oropharyngeal exudate.  Eyes: Conjunctivae are normal. Pupils are equal, round, and reactive to light.  Neck: Normal range of motion. Neck supple.  Cardiovascular: Normal rate, regular rhythm and normal heart sounds.   Pulmonary/Chest: Effort normal and breath sounds normal.  Abdominal: Soft. Bowel sounds are normal. She exhibits no distension. There is no tenderness.  Neurological: She is alert and oriented to person, place, and time.    Skin: Skin is warm and dry. She is not diaphoretic.  Psychiatric: She has a normal mood and affect.    Labs reviewed: Basic Metabolic Panel:  Recent Labs  04/25/14 1058 08/11/14 1117 02/19/15 1037  NA 142 139 142  K 4.1 3.8 3.7  CL 98 97 98  CO2 24 23 23   GLUCOSE 101* 98 99  BUN 10 10 9   CREATININE 0.86 0.72 0.78  CALCIUM 10.0 9.8 9.7  TSH 2.860 2.720  --  Liver Function Tests:  Recent Labs  04/25/14 1058 08/11/14 1117 02/19/15 1037  AST 21 20 20   ALT 14 16 14   ALKPHOS 69 69 61  BILITOT 0.6 0.4 0.6  PROT 7.3 6.9 7.3  ALBUMIN 4.8 4.6 4.6   No results for input(s): LIPASE, AMYLASE in the last 8760 hours. No results for input(s): AMMONIA in the last 8760 hours. CBC:  Recent Labs  04/25/14 1058 08/11/14 1117  WBC 7.9 7.3  NEUTROABS 4.7 4.7  HGB 14.1 13.5  HCT 39.5 41.3  MCV 78* 83  PLT  --  391*   Lipid Panel:  Recent Labs  08/11/14 1117 02/19/15 1037  CHOL 192 161  HDL 54 57  LDLCALC 113* 85  TRIG 125 95  CHOLHDL 3.6 2.8   TSH:  Recent Labs  04/25/14 1058 08/11/14 1117  TSH 2.860 2.720   A1C: Lab Results  Component Value Date   HGBA1C 6.1* 02/19/2015     Assessment/Plan 1. Type 2 diabetes mellitus without complication, without long-term current use of insulin (HCC) -blood sugars stable at this time. To cont dietary modifications and metformin, cont exercise.  -to avoid artifical sweaters at this time due to using due to possibilby causing increased hunger and fatulence   2. Gastroesophageal reflux disease without esophagitis -currently on omeprazole 10 mg daily -avoid foods that trigger symptoms -worsening GERD over the last week, may use zantac or tums as needed -to notify if symptoms persist  3. Hereditary and idiopathic peripheral neuropathy Ongoing, increase exercise to see if that would help. Does not feel like this is bad enough to start medication at this time. Not keeping her up at night   4. Flatulence -to avoid  trigger foods, also carbonated beverages, straws etc -may use simethicone as needed  Follow up as needed Yoltzin Ransom K. Harle Battiest  [facility] [phone] 8 am - 5 pm) [phone] (after hours)

## 2015-04-12 NOTE — Patient Instructions (Addendum)
Water with lemon or cucumber -make sure you are drinking 8 8 oz glasses of water a day  Avoid artifical sweaters in your drinks, drinks like ICE or la croix are options  May use simethicone (gas X) over the counter as needed for gas and bloating May use Zantac (Ranitidine) 75 mg by mouth twice daily as needed for acid reflux, or can use TUMS

## 2015-04-16 DIAGNOSIS — J3089 Other allergic rhinitis: Secondary | ICD-10-CM | POA: Diagnosis not present

## 2015-04-16 DIAGNOSIS — J301 Allergic rhinitis due to pollen: Secondary | ICD-10-CM | POA: Diagnosis not present

## 2015-04-17 DIAGNOSIS — M25512 Pain in left shoulder: Secondary | ICD-10-CM | POA: Diagnosis not present

## 2015-04-23 DIAGNOSIS — J3089 Other allergic rhinitis: Secondary | ICD-10-CM | POA: Diagnosis not present

## 2015-04-23 DIAGNOSIS — J301 Allergic rhinitis due to pollen: Secondary | ICD-10-CM | POA: Diagnosis not present

## 2015-04-30 DIAGNOSIS — J301 Allergic rhinitis due to pollen: Secondary | ICD-10-CM | POA: Diagnosis not present

## 2015-04-30 DIAGNOSIS — J3089 Other allergic rhinitis: Secondary | ICD-10-CM | POA: Diagnosis not present

## 2015-05-03 ENCOUNTER — Other Ambulatory Visit: Payer: Self-pay | Admitting: Internal Medicine

## 2015-05-07 DIAGNOSIS — J3089 Other allergic rhinitis: Secondary | ICD-10-CM | POA: Diagnosis not present

## 2015-05-07 DIAGNOSIS — J301 Allergic rhinitis due to pollen: Secondary | ICD-10-CM | POA: Diagnosis not present

## 2015-05-14 ENCOUNTER — Other Ambulatory Visit: Payer: Self-pay | Admitting: Internal Medicine

## 2015-05-14 DIAGNOSIS — J3089 Other allergic rhinitis: Secondary | ICD-10-CM | POA: Diagnosis not present

## 2015-05-14 DIAGNOSIS — J301 Allergic rhinitis due to pollen: Secondary | ICD-10-CM | POA: Diagnosis not present

## 2015-05-22 DIAGNOSIS — J301 Allergic rhinitis due to pollen: Secondary | ICD-10-CM | POA: Diagnosis not present

## 2015-05-22 DIAGNOSIS — J3089 Other allergic rhinitis: Secondary | ICD-10-CM | POA: Diagnosis not present

## 2015-05-30 DIAGNOSIS — J069 Acute upper respiratory infection, unspecified: Secondary | ICD-10-CM | POA: Diagnosis not present

## 2015-05-30 DIAGNOSIS — J3089 Other allergic rhinitis: Secondary | ICD-10-CM | POA: Diagnosis not present

## 2015-05-30 DIAGNOSIS — J301 Allergic rhinitis due to pollen: Secondary | ICD-10-CM | POA: Diagnosis not present

## 2015-05-30 DIAGNOSIS — J4530 Mild persistent asthma, uncomplicated: Secondary | ICD-10-CM | POA: Diagnosis not present

## 2015-06-06 DIAGNOSIS — J301 Allergic rhinitis due to pollen: Secondary | ICD-10-CM | POA: Diagnosis not present

## 2015-06-06 DIAGNOSIS — J3089 Other allergic rhinitis: Secondary | ICD-10-CM | POA: Diagnosis not present

## 2015-06-11 DIAGNOSIS — J301 Allergic rhinitis due to pollen: Secondary | ICD-10-CM | POA: Diagnosis not present

## 2015-06-11 DIAGNOSIS — J3089 Other allergic rhinitis: Secondary | ICD-10-CM | POA: Diagnosis not present

## 2015-06-18 DIAGNOSIS — J301 Allergic rhinitis due to pollen: Secondary | ICD-10-CM | POA: Diagnosis not present

## 2015-06-18 DIAGNOSIS — J3089 Other allergic rhinitis: Secondary | ICD-10-CM | POA: Diagnosis not present

## 2015-06-25 ENCOUNTER — Other Ambulatory Visit: Payer: Medicare Other

## 2015-06-25 DIAGNOSIS — E785 Hyperlipidemia, unspecified: Secondary | ICD-10-CM

## 2015-06-25 DIAGNOSIS — J301 Allergic rhinitis due to pollen: Secondary | ICD-10-CM | POA: Diagnosis not present

## 2015-06-25 DIAGNOSIS — E119 Type 2 diabetes mellitus without complications: Secondary | ICD-10-CM

## 2015-06-25 DIAGNOSIS — I10 Essential (primary) hypertension: Secondary | ICD-10-CM

## 2015-06-25 DIAGNOSIS — J3089 Other allergic rhinitis: Secondary | ICD-10-CM | POA: Diagnosis not present

## 2015-06-26 DIAGNOSIS — M25562 Pain in left knee: Secondary | ICD-10-CM | POA: Diagnosis not present

## 2015-06-26 LAB — HEMOGLOBIN A1C
ESTIMATED AVERAGE GLUCOSE: 131 mg/dL
HEMOGLOBIN A1C: 6.2 % — AB (ref 4.8–5.6)

## 2015-06-26 LAB — LIPID PANEL
Chol/HDL Ratio: 3 ratio units (ref 0.0–4.4)
Cholesterol, Total: 210 mg/dL — ABNORMAL HIGH (ref 100–199)
HDL: 70 mg/dL (ref >39)
LDL Calculated: 122 mg/dL — ABNORMAL HIGH (ref 0–99)
TRIGLYCERIDES: 92 mg/dL (ref 0–149)
VLDL CHOLESTEROL CAL: 18 mg/dL (ref 5–40)

## 2015-06-26 LAB — BASIC METABOLIC PANEL
BUN/Creatinine Ratio: 18 (ref 11–26)
BUN: 14 mg/dL (ref 8–27)
CALCIUM: 9.9 mg/dL (ref 8.7–10.3)
CO2: 23 mmol/L (ref 18–29)
CREATININE: 0.79 mg/dL (ref 0.57–1.00)
Chloride: 99 mmol/L (ref 96–106)
GFR, EST AFRICAN AMERICAN: 93 mL/min/{1.73_m2} (ref >59)
GFR, EST NON AFRICAN AMERICAN: 81 mL/min/{1.73_m2} (ref >59)
Glucose: 88 mg/dL (ref 65–99)
POTASSIUM: 4.2 mmol/L (ref 3.5–5.2)
Sodium: 144 mmol/L (ref 134–144)

## 2015-06-26 LAB — ALT: ALT: 13 IU/L (ref 0–32)

## 2015-06-27 ENCOUNTER — Ambulatory Visit (INDEPENDENT_AMBULATORY_CARE_PROVIDER_SITE_OTHER): Payer: Medicare Other | Admitting: Internal Medicine

## 2015-06-27 ENCOUNTER — Encounter: Payer: Self-pay | Admitting: Internal Medicine

## 2015-06-27 VITALS — BP 130/78 | HR 59 | Temp 98.3°F | Resp 20 | Ht 69.0 in | Wt 272.6 lb

## 2015-06-27 DIAGNOSIS — K219 Gastro-esophageal reflux disease without esophagitis: Secondary | ICD-10-CM | POA: Diagnosis not present

## 2015-06-27 DIAGNOSIS — E119 Type 2 diabetes mellitus without complications: Secondary | ICD-10-CM | POA: Diagnosis not present

## 2015-06-27 DIAGNOSIS — E785 Hyperlipidemia, unspecified: Secondary | ICD-10-CM

## 2015-06-27 DIAGNOSIS — I10 Essential (primary) hypertension: Secondary | ICD-10-CM | POA: Diagnosis not present

## 2015-06-27 DIAGNOSIS — G609 Hereditary and idiopathic neuropathy, unspecified: Secondary | ICD-10-CM

## 2015-06-27 DIAGNOSIS — J301 Allergic rhinitis due to pollen: Secondary | ICD-10-CM | POA: Diagnosis not present

## 2015-06-27 NOTE — Progress Notes (Signed)
Patient ID: Cheryl [name], female   DOB: 1956/05/01, 60 y.o.   MRN: [id number]    Location:    [facility]   Place of Service:   OFFICE  Chief Complaint  Patient presents with  . Medical Management of Chronic Issues    HPI:  60 yo female seen today for f/u. She reports healthy diet and chooses foods that are healthy. She requests low fat low cholesterol diet. LDL 122  HTN - stable on maxzide. Takes ASA daily  DM - BS <100 most days. No low BS reactions. Occasional numbness/tingling in ands/feet. She takes metformin. A1c 6.2%  GERD - stable on omeprazole  allergic rhinitis/asthma - she reports increased issues with seasonal allergy. She is taking shots, allegra, using nasal flonase and veramyst, Qvar inhaler. She has not tried nasal saline spray. Also using ginger and lemon tea. She feels nauseated form post nasal drip. She did see her allergist recently for viral URI and rec'd steroid shot  Arthritis - pain stable on prn tramadol. She sees Dr Percell Miller (ortho). She had left knee pain and saw Dr Percell Miller who treated left hip bursitis with injection which helped knee.   Past Medical History  Diagnosis Date  . Hypertension   . Diabetes mellitus without complication (HCC)     diet- controlled  . Asthma   . GERD (gastroesophageal reflux disease)   . Arthritis   . Impaired fasting glucose   . Morbid obesity ([place])   . Carpal tunnel syndrome   . Other specified cardiac dysrhythmias(427.89)   . Symptomatic menopausal or female climacteric states   . Allergic rhinitis, cause unspecified   . Pain in joint, site unspecified   . Other malaise and fatigue     Past Surgical History  Procedure Laterality Date  . Carpal tunnel both hands      2012  . Joint replacement      both knees replacement, 2010  . Abdominal hysterectomy      1994  . Nodule removed from back    . Mole removed from face    . Tonsillectomy      as teenager  . Shoulder surgery Left 11/2013  . Closed manipulation shoulder  Left 04/2014    Patient Care Team: [name], DO as PCP - General (Internal Medicine)  Social History   Social History  . Marital Status: Single    Spouse Name: N/A  . Number of Children: N/A  . Years of Education: N/A   Occupational History  . Not on file.   Social History Main Topics  . Smoking status: Never Smoker   . Smokeless tobacco: Never Used  . Alcohol Use: No  . Drug Use: No  . Sexual Activity: No     Comment: post office worker   Other Topics Concern  . Not on file   Social History Narrative     reports that she has never smoked. She has never used smokeless tobacco. She reports that she does not drink alcohol or use illicit drugs.  Allergies  Allergen Reactions  . Oxycodone     Had stomach and headache as side effect from medicine  . Sulfur Diarrhea    Medications: Patient's Medications  New Prescriptions   No medications on file  Previous Medications   AMBULATORY NON FORMULARY MEDICATION    Medication Name: Allergy Injection- once weekly   ASCORBIC ACID (VITAMIN C) 100 MG TABLET    Take 1,000 mg by mouth daily.   ASPIRIN 81  MG TABLET    Take 81 mg by mouth daily.   B-D ULTRA-FINE 33 LANCETS MISC    1 each by Does not apply route as directed.   B-D ULTRA-FINE 33 LANCETS MISC    USE AS DIRECTED   BECLOMETHASONE (QVAR) 80 MCG/ACT INHALER    Inhale 1 puff into the lungs as needed.   CALCIUM CARBONATE-VITAMIN D (CALTRATE 600+D) 600-400 MG-UNIT PER CHEW TABLET    Chew 1 tablet by mouth daily. Chew one tablet once a day   EPIPEN 2-PAK 0.3 MG/0.3ML SOAJ INJECTION       FEXOFENADINE (ALLEGRA) 180 MG TABLET    Take one tablet by mouth once daily for allergies   FLUTICASONE (FLONASE) 50 MCG/ACT NASAL SPRAY    One spray each nostril once daily for allergies   FLUTICASONE (VERAMYST) 27.5 MCG/SPRAY NASAL SPRAY    Place 2 sprays into the nose 2 (two) times daily.   GLUCOSE BLOOD (ACCU-CHEK AVIVA PLUS) TEST STRIP    Check blood sugar once daily as instructed  DX E11.9   METFORMIN (GLUCOPHAGE) 500 MG TABLET    Take 1 tablet (500 mg total) by mouth every other day.   MULTIPLE VITAMIN (MULTIVITAMIN) TABLET    Take 1 tablet by mouth daily. Take one tablet once a day   OMEPRAZOLE (PRILOSEC) 10 MG CAPSULE    Take one capsule by mouth once daily for stomach   SIMVASTATIN (ZOCOR) 20 MG TABLET    Take one tablet by mouth once daily at bedtime for cholesterol   TRAMADOL (ULTRAM) 50 MG TABLET    Take 50 mg by mouth every 8 (eight) hours as needed for pain (take one tab every 8-12 hour as needed for pain).   TRIAMTERENE-HYDROCHLOROTHIAZIDE (MAXZIDE) 75-50 MG TABLET    Take one tablet by mouth once daily for blood pressure   VITAMIN E (VITAMIN E) 400 UNIT CAPSULE    Take 400 Units by mouth daily.  Modified Medications   No medications on file  Discontinued Medications   SIMVASTATIN (ZOCOR) 10 MG TABLET    TAKE 1 TABLET EVERY DAY FOR CHOLESTEROL    Review of Systems  Musculoskeletal: Positive for arthralgias and gait problem.  Neurological: Positive for numbness.  All other systems reviewed and are negative.   Filed Vitals:   06/27/15 1544  BP: 130/78  Pulse: 59  Temp: 98.3 F (36.8 C)  TempSrc: Oral  Resp: 20  Height: 5\' 9"  (1.753 m)  Weight: 272 lb 9.6 oz (123.651 kg)  SpO2: 97%   Body mass index is 40.24 kg/(m^2).  Physical Exam  Constitutional: She is oriented to person, place, and time. She appears well-developed and well-nourished.  HENT:  Mouth/Throat: Oropharynx is clear and moist. No oropharyngeal exudate.  Eyes: Pupils are equal, round, and reactive to light. No scleral icterus.  Neck: Neck supple. Carotid bruit is not present. No tracheal deviation present. No thyromegaly present.  Cardiovascular: Normal rate, regular rhythm, normal heart sounds and intact distal pulses.  Exam reveals no gallop and no friction rub.   No murmur heard. No LE edema b/l. no calf TTP.   Pulmonary/Chest: Effort normal and breath sounds normal. No  stridor. No respiratory distress. She has no wheezes. She has no rales.  Abdominal: Soft. Bowel sounds are normal. She exhibits no distension and no mass. There is no hepatomegaly. There is tenderness. There is no rebound and no guarding.  Musculoskeletal: She exhibits edema and tenderness.  Lymphadenopathy:    She has no  cervical adenopathy.  Neurological: She is alert and oriented to person, place, and time.  Skin: Skin is warm and dry. No rash noted.  Psychiatric: She has a normal mood and affect. Her behavior is normal. Judgment and thought content normal.     Labs reviewed: Appointment on 06/25/2015  Component Date Value Ref Range Status  . Glucose 06/25/2015 88  65 - 99 mg/dL Final  . BUN 06/25/2015 14  8 - 27 mg/dL Final  . Creatinine, Ser 06/25/2015 0.79  0.57 - 1.00 mg/dL Final  . GFR calc non Af Amer 06/25/2015 81  >59 mL/min/1.73 Final  . GFR calc Af Amer 06/25/2015 93  >59 mL/min/1.73 Final  . BUN/Creatinine Ratio 06/25/2015 18  11 - 26 Final  . Sodium 06/25/2015 144  134 - 144 mmol/L Final  . Potassium 06/25/2015 4.2  3.5 - 5.2 mmol/L Final  . Chloride 06/25/2015 99  96 - 106 mmol/L Final  . CO2 06/25/2015 23  18 - 29 mmol/L Final  . Calcium 06/25/2015 9.9  8.7 - 10.3 mg/dL Final  . ALT 06/25/2015 13  0 - 32 IU/L Final  . Hgb A1c MFr Bld 06/25/2015 6.2* 4.8 - 5.6 % Final   Comment:          Pre-diabetes: 5.7 - 6.4          Diabetes: >6.4          Glycemic control for adults with diabetes: <7.0   . Est. average glucose Bld gHb Est-m* 06/25/2015 131   Final  . Cholesterol, Total 06/25/2015 210* 100 - 199 mg/dL Final  . Triglycerides 06/25/2015 92  0 - 149 mg/dL Final  . HDL 06/25/2015 70  >39 mg/dL Final  . VLDL Cholesterol Cal 06/25/2015 18  5 - 40 mg/dL Final  . LDL Calculated 06/25/2015 122* 0 - 99 mg/dL Final  . Chol/HDL Ratio 06/25/2015 3.0  0.0 - 4.4 ratio units Final   Comment:                                   T. Chol/HDL Ratio                                              Men  Women                               1/2 Avg.Risk  3.4    3.3                                   Avg.Risk  5.0    4.4                                2X Avg.Risk  9.6    7.1                                3X Avg.Risk 23.4   11.0   Office Visit on 04/12/2015  Component Date Value Ref Range Status  . HM Mammogram 09/25/2014 per patient-Dr. Renelda Loma   Final  No results found.   Assessment/Plan   ICD-9-CM ICD-10-CM   1. Hyperlipidemia LDL goal <100 272.4 E78.5 Lipid Panel  2. Type 2 diabetes mellitus without complication, without long-term current use of insulin (HCC) 250.00 E11.9 CMP     Hemoglobin A1c  3. Hereditary and idiopathic peripheral neuropathy 356.9 G60.9   4. Gastroesophageal reflux disease without esophagitis 530.81 K21.9   5. Seasonal allergic rhinitis due to pollen 477.0 J30.1   6. Essential hypertension, benign 401.1 I10    Continue current medications as ordered  Follow up in 4 mos for routine visit. Fasting labs prior to appt  Low fat/low cholesterol diet education handout given  Cordella Register. Perlie Gold  [facility] [address] [place], [place] [address] [phone] Cell (Monday-Friday 8 AM - 5 PM) [phone] After 5 PM and follow prompts

## 2015-06-27 NOTE — Patient Instructions (Addendum)
Continue current medications as ordered  Follow up in 4 mos for routine visit. Fasting labs prior to appt     Fat and Cholesterol Restricted Diet Getting too much fat and cholesterol in your diet may cause health problems. Following this diet helps keep your fat and cholesterol at normal levels. This can keep you from getting sick. WHAT TYPES OF FAT SHOULD I CHOOSE?  Choose monosaturated and polyunsaturated fats. These are found in foods such as olive oil, canola oil, flaxseeds, walnuts, almonds, and seeds.  Eat more omega-3 fats. Good choices include salmon, mackerel, sardines, tuna, flaxseed oil, and ground flaxseeds.  Limit saturated fats. These are in animal products such as meats, butter, and cream. They can also be in plant products such as palm oil, palm kernel oil, and coconut oil.   Avoid foods with partially hydrogenated oils in them. These contain trans fats. Examples of foods that have trans fats are stick margarine, some tub margarines, cookies, crackers, and other baked goods. WHAT GENERAL GUIDELINES DO I NEED TO FOLLOW?   Check food labels. Look for the words "trans fat" and "saturated fat."  When preparing a meal:  Fill half of your plate with vegetables and green salads.  Fill one fourth of your plate with whole grains. Look for the word "whole" as the first word in the ingredient list.  Fill one fourth of your plate with lean protein foods.  Limit fruit to two servings a day. Choose fruit instead of juice.  Eat more foods with soluble fiber. Examples of foods with this type of fiber are apples, broccoli, carrots, beans, peas, and barley. Try to get 20-30 g (grams) of fiber per day.  Eat more home-cooked foods. Eat less at restaurants and buffets.  Limit or avoid alcohol.  Limit foods high in starch and sugar.  Limit fried foods.  Cook foods without frying them. Baking, boiling, grilling, and broiling are all great options.  Lose weight if you are  overweight. Losing even a small amount of weight can help your overall health. It can also help prevent diseases such as diabetes and heart disease. WHAT FOODS CAN I EAT? Grains Whole grains, such as whole wheat or whole grain breads, crackers, cereals, and pasta. Unsweetened oatmeal, bulgur, barley, quinoa, or brown rice. Corn or whole wheat flour tortillas. Vegetables Fresh or frozen vegetables (raw, steamed, roasted, or grilled). Green salads. Fruits All fresh, canned (in natural juice), or frozen fruits. Meat and Other Protein Products Ground beef (85% or leaner), grass-fed beef, or beef trimmed of fat. Skinless chicken or [place]. Ground chicken or [place]. Pork trimmed of fat. All fish and seafood. Eggs. Dried beans, peas, or lentils. Unsalted nuts or seeds. Unsalted canned or dry beans. Dairy Low-fat dairy products, such as skim or 1% milk, 2% or reduced-fat cheeses, low-fat ricotta or cottage cheese, or plain low-fat yogurt. Fats and Oils Tub margarines without trans fats. Light or reduced-fat mayonnaise and salad dressings. Avocado. Olive, canola, sesame, or safflower oils. Natural peanut or almond butter (choose ones without added sugar and oil). The items listed above may not be a complete list of recommended foods or beverages. Contact your dietitian for more options. WHAT FOODS ARE NOT RECOMMENDED? Grains White bread. White pasta. White rice. Cornbread. Bagels, pastries, and croissants. Crackers that contain trans fat. Vegetables White potatoes. Corn. Creamed or fried vegetables. Vegetables in a cheese sauce. Fruits Dried fruits. Canned fruit in light or heavy syrup. Fruit juice. Meat and Other Protein Products Fatty cuts  of meat. Ribs, chicken wings, bacon, sausage, bologna, salami, chitterlings, fatback, hot dogs, bratwurst, and packaged luncheon meats. Liver and organ meats. Dairy Whole or 2% milk, cream, half-and-half, and cream cheese. Whole milk cheeses. Whole-fat or  sweetened yogurt. Full-fat cheeses. Nondairy creamers and whipped toppings. Processed cheese, cheese spreads, or cheese curds. Sweets and Desserts Corn syrup, sugars, honey, and molasses. Candy. Jam and jelly. Syrup. Sweetened cereals. Cookies, pies, cakes, donuts, muffins, and ice cream. Fats and Oils Butter, stick margarine, lard, shortening, ghee, or bacon fat. Coconut, palm kernel, or palm oils. Beverages Alcohol. Sweetened drinks (such as sodas, lemonade, and fruit drinks or punches). The items listed above may not be a complete list of foods and beverages to avoid. Contact your dietitian for more information.   This information is not intended to replace advice given to you by your health care provider. Make sure you discuss any questions you have with your health care provider.   Document Released: 11/11/2011 Document Revised: 06/02/2014 Document Reviewed: 08/11/2013 Elsevier Interactive Patient Education [employer].

## 2015-07-02 DIAGNOSIS — J301 Allergic rhinitis due to pollen: Secondary | ICD-10-CM | POA: Diagnosis not present

## 2015-07-02 DIAGNOSIS — J3089 Other allergic rhinitis: Secondary | ICD-10-CM | POA: Diagnosis not present

## 2015-07-09 DIAGNOSIS — J3089 Other allergic rhinitis: Secondary | ICD-10-CM | POA: Diagnosis not present

## 2015-07-09 DIAGNOSIS — J301 Allergic rhinitis due to pollen: Secondary | ICD-10-CM | POA: Diagnosis not present

## 2015-07-16 DIAGNOSIS — J3089 Other allergic rhinitis: Secondary | ICD-10-CM | POA: Diagnosis not present

## 2015-07-16 DIAGNOSIS — J301 Allergic rhinitis due to pollen: Secondary | ICD-10-CM | POA: Diagnosis not present

## 2015-07-20 DIAGNOSIS — J301 Allergic rhinitis due to pollen: Secondary | ICD-10-CM | POA: Diagnosis not present

## 2015-07-20 DIAGNOSIS — J3089 Other allergic rhinitis: Secondary | ICD-10-CM | POA: Diagnosis not present

## 2015-07-23 DIAGNOSIS — J3089 Other allergic rhinitis: Secondary | ICD-10-CM | POA: Diagnosis not present

## 2015-07-23 DIAGNOSIS — J301 Allergic rhinitis due to pollen: Secondary | ICD-10-CM | POA: Diagnosis not present

## 2015-07-30 DIAGNOSIS — J3089 Other allergic rhinitis: Secondary | ICD-10-CM | POA: Diagnosis not present

## 2015-07-30 DIAGNOSIS — J301 Allergic rhinitis due to pollen: Secondary | ICD-10-CM | POA: Diagnosis not present

## 2015-08-06 DIAGNOSIS — J3089 Other allergic rhinitis: Secondary | ICD-10-CM | POA: Diagnosis not present

## 2015-08-06 DIAGNOSIS — J301 Allergic rhinitis due to pollen: Secondary | ICD-10-CM | POA: Diagnosis not present

## 2015-08-09 DIAGNOSIS — H25813 Combined forms of age-related cataract, bilateral: Secondary | ICD-10-CM | POA: Diagnosis not present

## 2015-08-09 DIAGNOSIS — E119 Type 2 diabetes mellitus without complications: Secondary | ICD-10-CM | POA: Diagnosis not present

## 2015-08-09 DIAGNOSIS — H40033 Anatomical narrow angle, bilateral: Secondary | ICD-10-CM | POA: Diagnosis not present

## 2015-08-13 DIAGNOSIS — J301 Allergic rhinitis due to pollen: Secondary | ICD-10-CM | POA: Diagnosis not present

## 2015-08-13 DIAGNOSIS — J3089 Other allergic rhinitis: Secondary | ICD-10-CM | POA: Diagnosis not present

## 2015-08-17 DIAGNOSIS — H40033 Anatomical narrow angle, bilateral: Secondary | ICD-10-CM | POA: Diagnosis not present

## 2015-08-20 DIAGNOSIS — J3089 Other allergic rhinitis: Secondary | ICD-10-CM | POA: Diagnosis not present

## 2015-08-20 DIAGNOSIS — J301 Allergic rhinitis due to pollen: Secondary | ICD-10-CM | POA: Diagnosis not present

## 2015-08-24 DIAGNOSIS — J3089 Other allergic rhinitis: Secondary | ICD-10-CM | POA: Diagnosis not present

## 2015-08-24 DIAGNOSIS — J301 Allergic rhinitis due to pollen: Secondary | ICD-10-CM | POA: Diagnosis not present

## 2015-08-25 HISTORY — PX: EYE SURGERY: SHX253

## 2015-08-27 ENCOUNTER — Other Ambulatory Visit: Payer: Self-pay

## 2015-08-27 DIAGNOSIS — Z1211 Encounter for screening for malignant neoplasm of colon: Secondary | ICD-10-CM

## 2015-08-27 DIAGNOSIS — J3089 Other allergic rhinitis: Secondary | ICD-10-CM | POA: Diagnosis not present

## 2015-08-27 DIAGNOSIS — J301 Allergic rhinitis due to pollen: Secondary | ICD-10-CM | POA: Diagnosis not present

## 2015-08-29 DIAGNOSIS — J301 Allergic rhinitis due to pollen: Secondary | ICD-10-CM | POA: Diagnosis not present

## 2015-08-29 DIAGNOSIS — J3089 Other allergic rhinitis: Secondary | ICD-10-CM | POA: Diagnosis not present

## 2015-09-12 DIAGNOSIS — J3089 Other allergic rhinitis: Secondary | ICD-10-CM | POA: Diagnosis not present

## 2015-09-12 DIAGNOSIS — H1045 Other chronic allergic conjunctivitis: Secondary | ICD-10-CM | POA: Diagnosis not present

## 2015-09-12 DIAGNOSIS — J4530 Mild persistent asthma, uncomplicated: Secondary | ICD-10-CM | POA: Diagnosis not present

## 2015-09-12 DIAGNOSIS — J301 Allergic rhinitis due to pollen: Secondary | ICD-10-CM | POA: Diagnosis not present

## 2015-09-13 DIAGNOSIS — H1030 Unspecified acute conjunctivitis, unspecified eye: Secondary | ICD-10-CM | POA: Diagnosis not present

## 2015-09-18 DIAGNOSIS — J3089 Other allergic rhinitis: Secondary | ICD-10-CM | POA: Diagnosis not present

## 2015-09-18 DIAGNOSIS — J301 Allergic rhinitis due to pollen: Secondary | ICD-10-CM | POA: Diagnosis not present

## 2015-09-19 ENCOUNTER — Other Ambulatory Visit: Payer: Medicare Other

## 2015-09-19 DIAGNOSIS — Z1211 Encounter for screening for malignant neoplasm of colon: Secondary | ICD-10-CM

## 2015-09-21 DIAGNOSIS — H40033 Anatomical narrow angle, bilateral: Secondary | ICD-10-CM | POA: Diagnosis not present

## 2015-09-21 LAB — FECAL OCCULT BLOOD, IMMUNOCHEMICAL: Fecal Occult Bld: NEGATIVE

## 2015-09-22 ENCOUNTER — Other Ambulatory Visit: Payer: Self-pay | Admitting: Internal Medicine

## 2015-09-24 DIAGNOSIS — J3089 Other allergic rhinitis: Secondary | ICD-10-CM | POA: Diagnosis not present

## 2015-09-24 DIAGNOSIS — J301 Allergic rhinitis due to pollen: Secondary | ICD-10-CM | POA: Diagnosis not present

## 2015-09-24 HISTORY — PX: EYE SURGERY: SHX253

## 2015-10-01 DIAGNOSIS — J3089 Other allergic rhinitis: Secondary | ICD-10-CM | POA: Diagnosis not present

## 2015-10-01 DIAGNOSIS — J301 Allergic rhinitis due to pollen: Secondary | ICD-10-CM | POA: Diagnosis not present

## 2015-10-08 DIAGNOSIS — J3089 Other allergic rhinitis: Secondary | ICD-10-CM | POA: Diagnosis not present

## 2015-10-08 DIAGNOSIS — J301 Allergic rhinitis due to pollen: Secondary | ICD-10-CM | POA: Diagnosis not present

## 2015-10-10 DIAGNOSIS — H25013 Cortical age-related cataract, bilateral: Secondary | ICD-10-CM | POA: Diagnosis not present

## 2015-10-10 DIAGNOSIS — E113293 Type 2 diabetes mellitus with mild nonproliferative diabetic retinopathy without macular edema, bilateral: Secondary | ICD-10-CM | POA: Diagnosis not present

## 2015-10-10 DIAGNOSIS — Z7984 Long term (current) use of oral hypoglycemic drugs: Secondary | ICD-10-CM | POA: Diagnosis not present

## 2015-10-10 DIAGNOSIS — H40033 Anatomical narrow angle, bilateral: Secondary | ICD-10-CM | POA: Diagnosis not present

## 2015-10-10 LAB — HM DIABETES EYE EXAM

## 2015-10-15 DIAGNOSIS — J3089 Other allergic rhinitis: Secondary | ICD-10-CM | POA: Diagnosis not present

## 2015-10-15 DIAGNOSIS — J301 Allergic rhinitis due to pollen: Secondary | ICD-10-CM | POA: Diagnosis not present

## 2015-10-19 DIAGNOSIS — M25562 Pain in left knee: Secondary | ICD-10-CM | POA: Diagnosis not present

## 2015-10-23 DIAGNOSIS — J3089 Other allergic rhinitis: Secondary | ICD-10-CM | POA: Diagnosis not present

## 2015-10-23 DIAGNOSIS — J301 Allergic rhinitis due to pollen: Secondary | ICD-10-CM | POA: Diagnosis not present

## 2015-10-24 ENCOUNTER — Other Ambulatory Visit: Payer: Medicare Other

## 2015-10-24 DIAGNOSIS — E785 Hyperlipidemia, unspecified: Secondary | ICD-10-CM

## 2015-10-24 DIAGNOSIS — E119 Type 2 diabetes mellitus without complications: Secondary | ICD-10-CM | POA: Diagnosis not present

## 2015-10-25 ENCOUNTER — Encounter: Payer: Self-pay | Admitting: Endocrinology

## 2015-10-25 ENCOUNTER — Ambulatory Visit (INDEPENDENT_AMBULATORY_CARE_PROVIDER_SITE_OTHER): Payer: Medicare Other | Admitting: Endocrinology

## 2015-10-25 VITALS — BP 124/68 | HR 65 | Temp 98.7°F | Resp 16 | Ht 69.0 in | Wt 277.4 lb

## 2015-10-25 DIAGNOSIS — E785 Hyperlipidemia, unspecified: Secondary | ICD-10-CM | POA: Diagnosis not present

## 2015-10-25 DIAGNOSIS — E119 Type 2 diabetes mellitus without complications: Secondary | ICD-10-CM

## 2015-10-25 LAB — COMPREHENSIVE METABOLIC PANEL
A/G RATIO: 1.8 (ref 1.2–2.2)
ALBUMIN: 4.4 g/dL (ref 3.6–4.8)
ALK PHOS: 58 IU/L (ref 39–117)
ALT: 13 IU/L (ref 0–32)
AST: 16 IU/L (ref 0–40)
BILIRUBIN TOTAL: 0.4 mg/dL (ref 0.0–1.2)
BUN / CREAT RATIO: 18 (ref 12–28)
BUN: 14 mg/dL (ref 8–27)
CHLORIDE: 100 mmol/L (ref 96–106)
CO2: 25 mmol/L (ref 18–29)
Calcium: 9.3 mg/dL (ref 8.7–10.3)
Creatinine, Ser: 0.8 mg/dL (ref 0.57–1.00)
GFR calc Af Amer: 91 mL/min/{1.73_m2} (ref >59)
GFR calc non Af Amer: 79 mL/min/{1.73_m2} (ref >59)
GLOBULIN, TOTAL: 2.4 g/dL (ref 1.5–4.5)
Glucose: 88 mg/dL (ref 65–99)
POTASSIUM: 3.9 mmol/L (ref 3.5–5.2)
SODIUM: 143 mmol/L (ref 134–144)
Total Protein: 6.8 g/dL (ref 6.0–8.5)

## 2015-10-25 LAB — HEMOGLOBIN A1C
ESTIMATED AVERAGE GLUCOSE: 131 mg/dL
Hgb A1c MFr Bld: 6.2 % — ABNORMAL HIGH (ref 4.8–5.6)

## 2015-10-25 LAB — LIPID PANEL
CHOLESTEROL TOTAL: 158 mg/dL (ref 100–199)
Chol/HDL Ratio: 2.4 ratio units (ref 0.0–4.4)
HDL: 66 mg/dL (ref >39)
LDL CALC: 78 mg/dL (ref 0–99)
Triglycerides: 69 mg/dL (ref 0–149)
VLDL Cholesterol Cal: 14 mg/dL (ref 5–40)

## 2015-10-25 NOTE — Progress Notes (Signed)
Patient ID: Cheryl [name], female   DOB: 08/01/1955, 60 y.o.   MRN: [id number]           Reason for Appointment: Consultation for Type 2 Diabetes  History of Present Illness:          Date of diagnosis of type 2 diabetes mellitus:        Background history:   She had a routine evaluation in 08/2012 and her A1c was 6.6 although her fasting glucose was only 103. She was told she had diabetes and was started on metformin 500 mg daily At that time she also was weighing over 300 pounds. She was previously seen by dietitian in 2013 for her obesity However she started watching her diet better and also walking program resulting in weight loss Subsequently her A1c has been consistently below 6.5 Review of her labs since 2014 did not show any high blood sugars  Recent history:   Current management, blood sugar patterns and problems identified:  She is checking her blood sugars daily with an Accu-Chek  Her fasting blood sugars are averaging 99  She is checking readings after lunch or supper sporadically and usually not 2 hours after eating and these are generally normal with highest reading 131     Non-insulin hypoglycemic drugs the patient is taking are: Metformin 500 mg every other day      Side effects from medications have been: None  Compliance with the medical regimen: Fair   Glucose monitoring:  done 1-2 times a day         Glucometer:  Accu-Chek     Blood Glucose readings by time of day and averages from meter download:  PREMEAL Breakfast Lunch Dinner Bedtime  Overall   Glucose range: 83-115   84-118  109-131    Median: 99    117     Self-care: The diet that the patient has been following is: tries to limit .    Supper 5 pm:   Typical meal intake: Breakfast is               Dietician visit, most recent: 2014               Exercise:  Walking outside or on treadmill, using bike 3-5/7, somewhat less lately  Weight history: Previous range 260-305  Wt Readings from Last 3  Encounters:  10/25/15 277 lb 6.4 oz (125.828 kg)  06/27/15 272 lb 9.6 oz (123.651 kg)  04/12/15 269 lb 12.8 oz (122.38 kg)    Glycemic control:   Lab Results  Component Value Date   HGBA1C 6.2* 10/24/2015   HGBA1C 6.2* 06/25/2015   HGBA1C 6.1* 02/19/2015   Lab Results  Component Value Date   LDLCALC 78 10/24/2015   CREATININE 0.80 10/24/2015   No results found for: MICRALBCREAT        Medication List       This list is accurate as of: 10/25/15  9:24 PM.  Always use your most recent med list.               ACCU-CHEK AVIVA PLUS test strip  Generic drug:  glucose blood  CHECK BLOOD SUGAR ONCE DAILY AS INSTRUCTED DX E11.9     ALAWAY OP  Apply to eye.     AMBULATORY NON FORMULARY MEDICATION  Medication Name: Allergy Injection- once weekly     aspirin 81 MG tablet  Take 81 mg by mouth daily.     B-D ULTRA-FINE 33 LANCETS  Misc  1 each by Does not apply route as directed.     B-D ULTRA-FINE 33 LANCETS Misc  USE AS DIRECTED     beclomethasone 80 MCG/ACT inhaler  Commonly known as:  QVAR  Inhale 1 puff into the lungs as needed.     CALTRATE 600+D 600-400 MG-UNIT chew tablet  Generic drug:  Calcium Carbonate-Vitamin D  Chew 1 tablet by mouth daily. Chew one tablet once a day     EPIPEN 2-PAK 0.3 mg/0.3 mL Soaj injection  Generic drug:  EPINEPHrine     fluticasone 27.5 MCG/SPRAY nasal spray  Commonly known as:  VERAMYST  Place 2 sprays into the nose 2 (two) times daily.     fluticasone 50 MCG/ACT nasal spray  Commonly known as:  FLONASE  One spray each nostril once daily for allergies     levocetirizine 5 MG tablet  Commonly known as:  XYZAL  Take 5 mg by mouth every evening.     metFORMIN 500 MG tablet  Commonly known as:  GLUCOPHAGE  Take 1 tablet (500 mg total) by mouth every other day.     multivitamin tablet  Take 1 tablet by mouth daily. Take one tablet once a day     omeprazole 10 MG capsule  Commonly known as:  PRILOSEC  Take one capsule  by mouth once daily for stomach     simvastatin 20 MG tablet  Commonly known as:  ZOCOR  Take one tablet by mouth once daily at bedtime for cholesterol     traMADol 50 MG tablet  Commonly known as:  ULTRAM  Take 50 mg by mouth every 8 (eight) hours as needed for pain (take one tab every 8-12 hour as needed for pain).     triamterene-hydrochlorothiazide 75-50 MG tablet  Commonly known as:  MAXZIDE  Take one tablet by mouth once daily for blood pressure     vitamin C 100 MG tablet  Take 1,000 mg by mouth daily.     vitamin E 400 UNIT capsule  Generic drug:  vitamin E  Take 400 Units by mouth daily.        Allergies:  Allergies  Allergen Reactions  . Oxycodone     Had stomach and headache as side effect from medicine  . Sulfur Diarrhea    Past Medical History  Diagnosis Date  . Hypertension   . Diabetes mellitus without complication (HCC)     diet- controlled  . Asthma   . GERD (gastroesophageal reflux disease)   . Arthritis   . Impaired fasting glucose   . Morbid obesity ([place])   . Carpal tunnel syndrome   . Other specified cardiac dysrhythmias(427.89)   . Symptomatic menopausal or female climacteric states   . Allergic rhinitis, cause unspecified   . Pain in joint, site unspecified   . Other malaise and fatigue     Past Surgical History  Procedure Laterality Date  . Carpal tunnel both hands      2012  . Joint replacement      both knees replacement, 2010  . Abdominal hysterectomy      1994  . Nodule removed from back    . Mole removed from face    . Tonsillectomy      as teenager  . Shoulder surgery Left 11/2013  . Closed manipulation shoulder Left 04/2014    Family History  Problem Relation Age of Onset  . Cancer Father   . Diabetes Father   . Diabetes  Brother   . Diabetes Paternal Aunt     Social History:  reports that she has never smoked. She has never used smokeless tobacco. She reports that she does not drink alcohol or use illicit  drugs.    Review of Systems    Lipid history: Is on simvastatin    Lab Results  Component Value Date   CHOL 158 10/24/2015   HDL 66 10/24/2015   LDLCALC 78 10/24/2015   TRIG 69 10/24/2015   CHOLHDL 2.4 10/24/2015           Hypertension: On Maxzide   Most recent foot exam:6/17  Review of Systems  Constitutional: Positive for weight loss and reduced appetite. Negative for malaise.       Food not tasting good and she wants to know if this is related to diabetes along with somewhat decreased appetite  Respiratory: Positive for shortness of breath.        Asthma  Endocrine: Negative for polydipsia.  Genitourinary: Positive for nocturia.       Nocturia twice  Musculoskeletal: Positive for joint pain.  Neurological: Positive for tingling.       Occasional tingling   Lab Results  Component Value Date   TSH 2.720 08/11/2014   '   LABS:  Appointment on 10/24/2015  Component Date Value Ref Range Status  . Glucose 10/24/2015 88  65 - 99 mg/dL Final  . BUN 10/24/2015 14  8 - 27 mg/dL Final  . Creatinine, Ser 10/24/2015 0.80  0.57 - 1.00 mg/dL Final  . GFR calc non Af Amer 10/24/2015 79  >59 mL/min/1.73 Final  . GFR calc Af Amer 10/24/2015 91  >59 mL/min/1.73 Final  . BUN/Creatinine Ratio 10/24/2015 18  12 - 28 Final  . Sodium 10/24/2015 143  134 - 144 mmol/L Final  . Potassium 10/24/2015 3.9  3.5 - 5.2 mmol/L Final  . Chloride 10/24/2015 100  96 - 106 mmol/L Final  . CO2 10/24/2015 25  18 - 29 mmol/L Final  . Calcium 10/24/2015 9.3  8.7 - 10.3 mg/dL Final  . Total Protein 10/24/2015 6.8  6.0 - 8.5 g/dL Final  . Albumin 10/24/2015 4.4  3.6 - 4.8 g/dL Final  . Globulin, Total 10/24/2015 2.4  1.5 - 4.5 g/dL Final  . Albumin/Globulin Ratio 10/24/2015 1.8  1.2 - 2.2 Final  . Bilirubin Total 10/24/2015 0.4  0.0 - 1.2 mg/dL Final  . Alkaline Phosphatase 10/24/2015 58  39 - 117 IU/L Final  . AST 10/24/2015 16  0 - 40 IU/L Final  . ALT 10/24/2015 13  0 - 32 IU/L Final  .  Cholesterol, Total 10/24/2015 158  100 - 199 mg/dL Final  . Triglycerides 10/24/2015 69  0 - 149 mg/dL Final  . HDL 10/24/2015 66  >39 mg/dL Final  . VLDL Cholesterol Cal 10/24/2015 14  5 - 40 mg/dL Final  . LDL Calculated 10/24/2015 78  0 - 99 mg/dL Final  . Chol/HDL Ratio 10/24/2015 2.4  0.0 - 4.4 ratio units Final   Comment:                                   T. Chol/HDL Ratio  Men  Women                               1/2 Avg.Risk  3.4    3.3                                   Avg.Risk  5.0    4.4                                2X Avg.Risk  9.6    7.1                                3X Avg.Risk 23.4   11.0   . Hgb A1c MFr Bld 10/24/2015 6.2* 4.8 - 5.6 % Final   Comment:          Pre-diabetes: 5.7 - 6.4          Diabetes: >6.4          Glycemic control for adults with diabetes: <7.0   . Est. average glucose Bld gHb Est-m* 10/24/2015 131   Final    Physical Examination:  BP 124/68 mmHg  Pulse 65  Temp(Src) 98.7 F (37.1 C)  Resp 16  Ht 5\' 9"  (1.753 m)  Wt 277 lb 6.4 oz (125.828 kg)  BMI 40.95 kg/m2  SpO2 96%  GENERAL:         Patient has generalized obesity.   HEENT:         Eye exam shows normal external appearance. Fundus exam shows no retinopathy. Oral exam shows normal mucosa .  NECK:   There is no lymphadenopathy Thyroid is not enlarged and no nodules felt.  Carotids are normal to palpation and no bruit heard LUNGS:         Chest is symmetrical. Lungs are clear to auscultation.Marland Kitchen   HEART:         Heart sounds:  S1 and S2 are normal. No murmur or click heard., no S3 or S4.   ABDOMEN:   There is no distention present. Liver and spleen are not palpable. No other mass or tenderness present.   NEUROLOGICAL:   Ankle jerks are absent bilaterally.    Diabetic Foot Exam - Simple   Simple Foot Form  Diabetic Foot exam was performed with the following findings:  Yes 10/25/2015  2:39 PM  Visual Inspection  No deformities, no ulcerations, no  other skin breakdown bilaterally:  Yes  Sensation Testing  Intact to touch and monofilament testing bilaterally:  Yes  Pulse Check  Posterior Tibialis and Dorsalis pulse intact bilaterally:  Yes  Comments             Vibration sense is  mildly reduced in distal first toes. MUSCULOSKELETAL:  There is no swelling or deformity of the peripheral joints. Spine is normal to inspection.   EXTREMITIES:     There is no edema. No skin lesions present.Marland Kitchen SKIN:       No rash or lesions of concern.        ASSESSMENT:   ?  Diabetes versus prediabetes.   She had an A1c of 6.7 at diagnosis but did not have any confirmed abnormal glucose levels either fasting or postprandial Since she is Afro-American her A1c  at baseline may not necessarily be accurate for diagnosis of diabetes and this was not confirmed on repeat testing A1c has been recently consistently 6.1-6.2 with fairly normal blood sugars at home and her labs She is taking minimal doses of metformin 500 mg every other day which is not therapeutic at all Currently blood sugars are practically normal at home on her glucose testing although checking mostly fasting and some at suppertime or bedtime, highest glucose 131  OBESITY  Complaints of altered taste and some decreased appetite: She will discuss with PCP   Hypercholesterolemia: Well controlled with low-dose Zocor  PLAN:    Stop metformin  Check blood sugars about 3 times a week, does not need to check daily.  She can alternate fasting and 2 hour after meal blood sugars instead of checking fasting daily   Discussed blood sugar targets at various times  She will try to be more consistent with walking or other exercise.  She will also try to be consistent with diet and will consider referring her to dietitian if needed   follow-up as needed  Patient Instructions  Check blood sugars on waking up 2-3  times a week Also check blood sugars about 2 hours after a meal and do this after  different meals by rotation  Recommended blood sugar levels on waking up is 90-130 and about 2 hours after meal is 130-160  Please bring your blood sugar monitor to each visit, thank you  Stop Metformin     Austyn Perriello 10/25/2015, 9:24 PM   Note: This office note was prepared with Dragon voice recognition system technology. Any transcriptional errors that result from this process are unintentional.

## 2015-10-25 NOTE — Patient Instructions (Signed)
Check blood sugars on waking up 2-3  times a week Also check blood sugars about 2 hours after a meal and do this after different meals by rotation  Recommended blood sugar levels on waking up is 90-130 and about 2 hours after meal is 130-160  Please bring your blood sugar monitor to each visit, thank you  Stop Metformin

## 2015-10-26 ENCOUNTER — Ambulatory Visit (INDEPENDENT_AMBULATORY_CARE_PROVIDER_SITE_OTHER): Payer: Medicare Other | Admitting: Internal Medicine

## 2015-10-26 VITALS — BP 142/72 | HR 74 | Temp 98.1°F | Ht 69.0 in | Wt 277.6 lb

## 2015-10-26 DIAGNOSIS — I10 Essential (primary) hypertension: Secondary | ICD-10-CM

## 2015-10-26 DIAGNOSIS — M25512 Pain in left shoulder: Secondary | ICD-10-CM

## 2015-10-26 DIAGNOSIS — E119 Type 2 diabetes mellitus without complications: Secondary | ICD-10-CM

## 2015-10-26 DIAGNOSIS — J301 Allergic rhinitis due to pollen: Secondary | ICD-10-CM | POA: Diagnosis not present

## 2015-10-26 DIAGNOSIS — E785 Hyperlipidemia, unspecified: Secondary | ICD-10-CM

## 2015-10-26 DIAGNOSIS — G609 Hereditary and idiopathic neuropathy, unspecified: Secondary | ICD-10-CM | POA: Diagnosis not present

## 2015-10-26 DIAGNOSIS — M159 Polyosteoarthritis, unspecified: Secondary | ICD-10-CM

## 2015-10-26 NOTE — Progress Notes (Signed)
Patient ID: Cheryl [name], female   DOB: 1956/01/14, 61 y.o.   MRN: [id number]    Location:  [facility] Place of Service: OFFICE  Chief Complaint  Patient presents with  . Medical Management of Chronic Issues    4 month office visit    HPI:  60 yo female seen today for f/u  Hyperlipidemia - stable. LDL 78  HTN - stable on maxzide. Takes ASA daily  DM - BS <100 most days. She checks CBGs 3 times per week. No low BS reactions. Occasional numbness/tingling in ands/feet. Metformin stopped by Endo Dr Dwyane Dee yesterday. A1c 6.2%. Endo told to f/u prn  GERD - stable on omeprazole  allergic rhinitis/asthma - she reports issues with seasonal allergy improved. She is taking shots, using nasal flonase and veramyst, Qvar inhaler. Allegra changed to xyzal. She does not use sasal saline spray. Also using ginger and lemon tea. She feels nauseated from post nasal drip. She did see her allergist recently for viral URI and rec'd steroid shot  Arthritis - pain stable on prn tramadol. She sees Dr Percell Miller (ortho). She had left knee pain and saw Dr Percell Miller who treated left hip bursitis with injection which helped knee.  Glaucoma - OU. She had laser procedure in March and April. Followed by Dr Arlina Robes at [facility]. Uses eye gtts, alaway prn  Past Medical History  Diagnosis Date  . Hypertension   . Diabetes mellitus without complication (HCC)     diet- controlled  . Asthma   . GERD (gastroesophageal reflux disease)   . Arthritis   . Impaired fasting glucose   . Morbid obesity ([place])   . Carpal tunnel syndrome   . Other specified cardiac dysrhythmias(427.89)   . Symptomatic menopausal or female climacteric states   . Allergic rhinitis, cause unspecified   . Pain in joint, site unspecified   . Other malaise and fatigue     Past Surgical History  Procedure Laterality Date  . Carpal tunnel both hands      2012  . Joint replacement      both knees replacement, 2010  . Abdominal hysterectomy      1994  .  Nodule removed from back    . Mole removed from face    . Tonsillectomy      as teenager  . Shoulder surgery Left 11/2013  . Closed manipulation shoulder Left 04/2014    Patient Care Team: [name], DO as PCP - General (Internal Medicine)  Social History   Social History  . Marital Status: Single    Spouse Name: N/A  . Number of Children: N/A  . Years of Education: N/A   Occupational History  . Not on file.   Social History Main Topics  . Smoking status: Never Smoker   . Smokeless tobacco: Never Used  . Alcohol Use: No  . Drug Use: No  . Sexual Activity: No     Comment: post office worker   Other Topics Concern  . Not on file   Social History Narrative     reports that she has never smoked. She has never used smokeless tobacco. She reports that she does not drink alcohol or use illicit drugs.  Family History  Problem Relation Age of Onset  . Cancer Father   . Diabetes Father   . Diabetes Brother   . Diabetes Paternal Aunt    Family Status  Relation Status Death Age  . Father Deceased 40  . Mother Deceased   .  Brother Deceased   . Son Alive   . Brother Alive   . Brother Alive   . Son Alive      Allergies  Allergen Reactions  . Oxycodone     Had stomach and headache as side effect from medicine  . Sulfur Diarrhea    Medications: Patient's Medications  New Prescriptions   No medications on file  Previous Medications   ACCU-CHEK AVIVA PLUS TEST STRIP    CHECK BLOOD SUGAR ONCE DAILY AS INSTRUCTED DX E11.9   AMBULATORY NON FORMULARY MEDICATION    Medication Name: Allergy Injection- once weekly   ASCORBIC ACID (VITAMIN C) 100 MG TABLET    Take 1,000 mg by mouth daily.   ASPIRIN 81 MG TABLET    Take 81 mg by mouth daily.   B-D ULTRA-FINE 33 LANCETS MISC    1 each by Does not apply route as directed.   BECLOMETHASONE (QVAR) 80 MCG/ACT INHALER    Inhale 1 puff into the lungs as needed.   CALCIUM CARBONATE-VITAMIN D (CALTRATE 600+D) 600-400 MG-UNIT  PER CHEW TABLET    Chew 1 tablet by mouth daily. Chew one tablet once a day   EPIPEN 2-PAK 0.3 MG/0.3ML SOAJ INJECTION       FLUTICASONE (FLONASE) 50 MCG/ACT NASAL SPRAY    One spray each nostril once daily for allergies   FLUTICASONE (VERAMYST) 27.5 MCG/SPRAY NASAL SPRAY    Place 2 sprays into the nose 2 (two) times daily.   KETOTIFEN FUMARATE (ALAWAY OP)    Place into both eyes.    LEVOCETIRIZINE (XYZAL) 5 MG TABLET    Take 5 mg by mouth every evening.   METFORMIN (GLUCOPHAGE) 500 MG TABLET    Take 1 tablet (500 mg total) by mouth every other day.   MULTIPLE VITAMIN (MULTIVITAMIN) TABLET    Take 1 tablet by mouth daily. Take one tablet once a day   OMEPRAZOLE (PRILOSEC) 10 MG CAPSULE    Take one capsule by mouth once daily for stomach   SIMVASTATIN (ZOCOR) 20 MG TABLET    Take one tablet by mouth once daily at bedtime for cholesterol   TRAMADOL (ULTRAM) 50 MG TABLET    Take 50 mg by mouth every 8 (eight) hours as needed for pain (take one tab every 8-12 hour as needed for pain).   TRIAMTERENE-HYDROCHLOROTHIAZIDE (MAXZIDE) 75-50 MG TABLET    Take one tablet by mouth once daily for blood pressure   VITAMIN E (VITAMIN E) 400 UNIT CAPSULE    Take 400 Units by mouth daily.  Modified Medications   No medications on file  Discontinued Medications   B-D ULTRA-FINE 33 LANCETS MISC    USE AS DIRECTED    Review of Systems  Musculoskeletal: Positive for joint swelling, arthralgias and gait problem.  All other systems reviewed and are negative.   Filed Vitals:   10/26/15 1331  BP: 142/72  Pulse: 74  Temp: 98.1 F (36.7 C)  TempSrc: Oral  Height: 5\' 9"  (1.753 m)  Weight: 277 lb 9.6 oz (125.919 kg)  SpO2: 97%   Body mass index is 40.98 kg/(m^2).  Physical Exam  Constitutional: She is oriented to person, place, and time. She appears well-developed and well-nourished.  HENT:  Mouth/Throat: Oropharynx is clear and moist. No oropharyngeal exudate.  Eyes: Pupils are equal, round, and reactive  to light. No scleral icterus.  Neck: Neck supple. Carotid bruit is not present. No tracheal deviation present.  Cardiovascular: Normal rate, regular rhythm, normal heart sounds  and intact distal pulses.  Exam reveals no gallop and no friction rub.   No murmur heard. No LE edema b/l. no calf TTP.   Pulmonary/Chest: Effort normal and breath sounds normal. No stridor. No respiratory distress. She has no wheezes. She has no rales.  Abdominal: Soft. Bowel sounds are normal. She exhibits no distension and no mass. There is no hepatomegaly. There is tenderness. There is no rebound and no guarding.  Musculoskeletal: She exhibits edema and tenderness.  Reduced left shoulder flexion  Lymphadenopathy:    She has no cervical adenopathy.  Neurological: She is alert and oriented to person, place, and time.  Skin: Skin is warm and dry. No rash noted.  Psychiatric: She has a normal mood and affect. Her behavior is normal. Judgment and thought content normal.     Labs reviewed: Appointment on 10/24/2015  Component Date Value Ref Range Status  . Glucose 10/24/2015 88  65 - 99 mg/dL Final  . BUN 10/24/2015 14  8 - 27 mg/dL Final  . Creatinine, Ser 10/24/2015 0.80  0.57 - 1.00 mg/dL Final  . GFR calc non Af Amer 10/24/2015 79  >59 mL/min/1.73 Final  . GFR calc Af Amer 10/24/2015 91  >59 mL/min/1.73 Final  . BUN/Creatinine Ratio 10/24/2015 18  12 - 28 Final  . Sodium 10/24/2015 143  134 - 144 mmol/L Final  . Potassium 10/24/2015 3.9  3.5 - 5.2 mmol/L Final  . Chloride 10/24/2015 100  96 - 106 mmol/L Final  . CO2 10/24/2015 25  18 - 29 mmol/L Final  . Calcium 10/24/2015 9.3  8.7 - 10.3 mg/dL Final  . Total Protein 10/24/2015 6.8  6.0 - 8.5 g/dL Final  . Albumin 10/24/2015 4.4  3.6 - 4.8 g/dL Final  . Globulin, Total 10/24/2015 2.4  1.5 - 4.5 g/dL Final  . Albumin/Globulin Ratio 10/24/2015 1.8  1.2 - 2.2 Final  . Bilirubin Total 10/24/2015 0.4  0.0 - 1.2 mg/dL Final  . Alkaline Phosphatase 10/24/2015 58   39 - 117 IU/L Final  . AST 10/24/2015 16  0 - 40 IU/L Final  . ALT 10/24/2015 13  0 - 32 IU/L Final  . Cholesterol, Total 10/24/2015 158  100 - 199 mg/dL Final  . Triglycerides 10/24/2015 69  0 - 149 mg/dL Final  . HDL 10/24/2015 66  >39 mg/dL Final  . VLDL Cholesterol Cal 10/24/2015 14  5 - 40 mg/dL Final  . LDL Calculated 10/24/2015 78  0 - 99 mg/dL Final  . Chol/HDL Ratio 10/24/2015 2.4  0.0 - 4.4 ratio units Final   Comment:                                   T. Chol/HDL Ratio                                             Men  Women                               1/2 Avg.Risk  3.4    3.3  Avg.Risk  5.0    4.4                                2X Avg.Risk  9.6    7.1                                3X Avg.Risk 23.4   11.0   . Hgb A1c MFr Bld 10/24/2015 6.2* 4.8 - 5.6 % Final   Comment:          Pre-diabetes: 5.7 - 6.4          Diabetes: >6.4          Glycemic control for adults with diabetes: <7.0   . Est. average glucose Bld gHb Est-m* 10/24/2015 131   Final  Appointment on 09/19/2015  Component Date Value Ref Range Status  . Fecal Occult Bld 09/19/2015 Negative  Negative Final    No results found.   Assessment/Plan   ICD-9-CM ICD-10-CM   1. Left shoulder pain 719.41 M25.512   2. Seasonal allergic rhinitis due to pollen 477.0 J30.1   3. Type 2 diabetes mellitus without complication, without long-term current use of insulin (HCC) 250.00 E11.9   4. Essential hypertension, benign 401.1 I10   5. Generalized osteoarthrosis, involving multiple sites 715.09 M15.9   6. Hyperlipidemia LDL goal <100 272.4 E78.5   7. Hereditary and idiopathic peripheral neuropathy 356.9 G60.9    Continue current medications as ordered  Cont antihistamine daily and add saline nasal spray as needed to keep nose moist  F/u with for left shoulder pain  Continue diet and exercise program  Follow up in 4 mos for routine visit. Fasting labs prior to appt     [place].  Perlie Gold  [facility] [address] [place],  [address] [phone] Cell (Monday-Friday 8 AM - 5 PM) [phone] After 5 PM and follow prompts

## 2015-10-26 NOTE — Patient Instructions (Signed)
Continue current medications as ordered  Continue diet and exercise program  Follow up in 4 mos for routine visit. Fasting labs prior to appt

## 2015-10-29 DIAGNOSIS — J301 Allergic rhinitis due to pollen: Secondary | ICD-10-CM | POA: Diagnosis not present

## 2015-10-29 DIAGNOSIS — J3089 Other allergic rhinitis: Secondary | ICD-10-CM | POA: Diagnosis not present

## 2015-11-05 DIAGNOSIS — J301 Allergic rhinitis due to pollen: Secondary | ICD-10-CM | POA: Diagnosis not present

## 2015-11-05 DIAGNOSIS — J3089 Other allergic rhinitis: Secondary | ICD-10-CM | POA: Diagnosis not present

## 2015-11-12 ENCOUNTER — Encounter: Payer: Self-pay | Admitting: *Deleted

## 2015-11-12 DIAGNOSIS — J3089 Other allergic rhinitis: Secondary | ICD-10-CM | POA: Diagnosis not present

## 2015-11-12 DIAGNOSIS — J301 Allergic rhinitis due to pollen: Secondary | ICD-10-CM | POA: Diagnosis not present

## 2015-11-15 ENCOUNTER — Other Ambulatory Visit: Payer: Self-pay | Admitting: Orthopedic Surgery

## 2015-11-15 DIAGNOSIS — M25512 Pain in left shoulder: Secondary | ICD-10-CM

## 2015-11-19 DIAGNOSIS — J301 Allergic rhinitis due to pollen: Secondary | ICD-10-CM | POA: Diagnosis not present

## 2015-11-19 DIAGNOSIS — Z1231 Encounter for screening mammogram for malignant neoplasm of breast: Secondary | ICD-10-CM | POA: Diagnosis not present

## 2015-11-19 DIAGNOSIS — J3089 Other allergic rhinitis: Secondary | ICD-10-CM | POA: Diagnosis not present

## 2015-11-26 ENCOUNTER — Other Ambulatory Visit: Payer: Medicare Other

## 2015-11-28 DIAGNOSIS — J3089 Other allergic rhinitis: Secondary | ICD-10-CM | POA: Diagnosis not present

## 2015-11-28 DIAGNOSIS — J301 Allergic rhinitis due to pollen: Secondary | ICD-10-CM | POA: Diagnosis not present

## 2015-12-03 ENCOUNTER — Other Ambulatory Visit: Payer: Medicare Other

## 2015-12-03 DIAGNOSIS — J3089 Other allergic rhinitis: Secondary | ICD-10-CM | POA: Diagnosis not present

## 2015-12-03 DIAGNOSIS — J301 Allergic rhinitis due to pollen: Secondary | ICD-10-CM | POA: Diagnosis not present

## 2015-12-10 ENCOUNTER — Ambulatory Visit
Admission: RE | Admit: 2015-12-10 | Discharge: 2015-12-10 | Disposition: A | Discharge status: Home / Self Care | Payer: Medicare Other | Source: Ambulatory Visit | Attending: Orthopedic Surgery | Admitting: Orthopedic Surgery

## 2015-12-10 DIAGNOSIS — J3089 Other allergic rhinitis: Secondary | ICD-10-CM | POA: Diagnosis not present

## 2015-12-10 DIAGNOSIS — M25512 Pain in left shoulder: Secondary | ICD-10-CM

## 2015-12-10 DIAGNOSIS — J301 Allergic rhinitis due to pollen: Secondary | ICD-10-CM | POA: Diagnosis not present

## 2015-12-10 DIAGNOSIS — M7582 Other shoulder lesions, left shoulder: Secondary | ICD-10-CM | POA: Diagnosis not present

## 2015-12-10 MED ORDER — IOPAMIDOL (ISOVUE-M 200) INJECTION 41%
15.0000 mL | Freq: Once | INTRAMUSCULAR | Status: AC
  Administered 2015-12-10: 15 mL via INTRA_ARTICULAR

## 2015-12-17 DIAGNOSIS — J3089 Other allergic rhinitis: Secondary | ICD-10-CM | POA: Diagnosis not present

## 2015-12-17 DIAGNOSIS — J301 Allergic rhinitis due to pollen: Secondary | ICD-10-CM | POA: Diagnosis not present

## 2015-12-18 DIAGNOSIS — M25512 Pain in left shoulder: Secondary | ICD-10-CM | POA: Diagnosis not present

## 2015-12-24 DIAGNOSIS — J301 Allergic rhinitis due to pollen: Secondary | ICD-10-CM | POA: Diagnosis not present

## 2015-12-24 DIAGNOSIS — J3089 Other allergic rhinitis: Secondary | ICD-10-CM | POA: Diagnosis not present

## 2015-12-27 DIAGNOSIS — J3089 Other allergic rhinitis: Secondary | ICD-10-CM | POA: Diagnosis not present

## 2015-12-27 DIAGNOSIS — J301 Allergic rhinitis due to pollen: Secondary | ICD-10-CM | POA: Diagnosis not present

## 2015-12-31 DIAGNOSIS — J301 Allergic rhinitis due to pollen: Secondary | ICD-10-CM | POA: Diagnosis not present

## 2015-12-31 DIAGNOSIS — J3089 Other allergic rhinitis: Secondary | ICD-10-CM | POA: Diagnosis not present

## 2016-01-08 DIAGNOSIS — J3089 Other allergic rhinitis: Secondary | ICD-10-CM | POA: Diagnosis not present

## 2016-01-08 DIAGNOSIS — J301 Allergic rhinitis due to pollen: Secondary | ICD-10-CM | POA: Diagnosis not present

## 2016-01-14 DIAGNOSIS — J301 Allergic rhinitis due to pollen: Secondary | ICD-10-CM | POA: Diagnosis not present

## 2016-01-14 DIAGNOSIS — J3089 Other allergic rhinitis: Secondary | ICD-10-CM | POA: Diagnosis not present

## 2016-01-16 NOTE — Addendum Note (Signed)
Addended by: SIMPSON, MESHELL A on: 01/16/2016 09:01 AM   Modules accepted: Orders  

## 2016-01-17 DIAGNOSIS — J301 Allergic rhinitis due to pollen: Secondary | ICD-10-CM | POA: Diagnosis not present

## 2016-01-17 DIAGNOSIS — J3089 Other allergic rhinitis: Secondary | ICD-10-CM | POA: Diagnosis not present

## 2016-01-21 DIAGNOSIS — J3089 Other allergic rhinitis: Secondary | ICD-10-CM | POA: Diagnosis not present

## 2016-01-21 DIAGNOSIS — J301 Allergic rhinitis due to pollen: Secondary | ICD-10-CM | POA: Diagnosis not present

## 2016-01-23 DIAGNOSIS — J3089 Other allergic rhinitis: Secondary | ICD-10-CM | POA: Diagnosis not present

## 2016-01-23 DIAGNOSIS — J301 Allergic rhinitis due to pollen: Secondary | ICD-10-CM | POA: Diagnosis not present

## 2016-01-29 DIAGNOSIS — J3089 Other allergic rhinitis: Secondary | ICD-10-CM | POA: Diagnosis not present

## 2016-01-29 DIAGNOSIS — J301 Allergic rhinitis due to pollen: Secondary | ICD-10-CM | POA: Diagnosis not present

## 2016-02-04 DIAGNOSIS — J301 Allergic rhinitis due to pollen: Secondary | ICD-10-CM | POA: Diagnosis not present

## 2016-02-04 DIAGNOSIS — J3089 Other allergic rhinitis: Secondary | ICD-10-CM | POA: Diagnosis not present

## 2016-02-12 DIAGNOSIS — H1045 Other chronic allergic conjunctivitis: Secondary | ICD-10-CM | POA: Diagnosis not present

## 2016-02-12 DIAGNOSIS — J301 Allergic rhinitis due to pollen: Secondary | ICD-10-CM | POA: Diagnosis not present

## 2016-02-12 DIAGNOSIS — J3089 Other allergic rhinitis: Secondary | ICD-10-CM | POA: Diagnosis not present

## 2016-02-12 DIAGNOSIS — J4530 Mild persistent asthma, uncomplicated: Secondary | ICD-10-CM | POA: Diagnosis not present

## 2016-02-18 DIAGNOSIS — J3089 Other allergic rhinitis: Secondary | ICD-10-CM | POA: Diagnosis not present

## 2016-02-18 DIAGNOSIS — J301 Allergic rhinitis due to pollen: Secondary | ICD-10-CM | POA: Diagnosis not present

## 2016-02-25 DIAGNOSIS — J3089 Other allergic rhinitis: Secondary | ICD-10-CM | POA: Diagnosis not present

## 2016-02-25 DIAGNOSIS — J301 Allergic rhinitis due to pollen: Secondary | ICD-10-CM | POA: Diagnosis not present

## 2016-02-27 ENCOUNTER — Other Ambulatory Visit: Payer: Medicare Other

## 2016-02-27 DIAGNOSIS — I10 Essential (primary) hypertension: Secondary | ICD-10-CM

## 2016-02-27 DIAGNOSIS — E785 Hyperlipidemia, unspecified: Secondary | ICD-10-CM | POA: Diagnosis not present

## 2016-02-27 DIAGNOSIS — E119 Type 2 diabetes mellitus without complications: Secondary | ICD-10-CM

## 2016-02-27 LAB — LIPID PANEL
CHOL/HDL RATIO: 3.2 ratio (ref <=5.0)
Cholesterol: 167 mg/dL (ref 125–200)
HDL: 53 mg/dL (ref >=46)
LDL CALC: 91 mg/dL (ref <130)
Triglycerides: 115 mg/dL (ref <150)
VLDL: 23 mg/dL (ref <30)

## 2016-02-27 LAB — CBC WITH DIFFERENTIAL/PLATELET
BASOS ABS: 65 {cells}/uL (ref 0–200)
Basophils Relative: 1 %
EOS ABS: 195 {cells}/uL (ref 15–500)
EOS PCT: 3 %
HCT: 41.4 % (ref 35.0–45.0)
HEMOGLOBIN: 13.9 g/dL (ref 11.7–15.5)
LYMPHS ABS: 1950 {cells}/uL (ref 850–3900)
Lymphocytes Relative: 30 %
MCH: 27.6 pg (ref 27.0–33.0)
MCHC: 33.6 g/dL (ref 32.0–36.0)
MCV: 82.1 fL (ref 80.0–100.0)
MONO ABS: 520 {cells}/uL (ref 200–950)
MPV: 9.4 fL (ref 7.5–12.5)
Monocytes Relative: 8 %
NEUTROS ABS: 3770 {cells}/uL (ref 1500–7800)
Neutrophils Relative %: 58 %
Platelets: 355 10*3/uL (ref 140–400)
RBC: 5.04 MIL/uL (ref 3.80–5.10)
RDW: 14.5 % (ref 11.0–15.0)
WBC: 6.5 10*3/uL (ref 3.8–10.8)

## 2016-02-27 LAB — COMPREHENSIVE METABOLIC PANEL
ALK PHOS: 50 U/L (ref 33–130)
ALT: 12 U/L (ref 6–29)
AST: 17 U/L (ref 10–35)
Albumin: 4.4 g/dL (ref 3.6–5.1)
BUN: 9 mg/dL (ref 7–25)
CALCIUM: 10 mg/dL (ref 8.6–10.4)
CHLORIDE: 103 mmol/L (ref 98–110)
CO2: 25 mmol/L (ref 20–31)
Creat: 0.79 mg/dL (ref 0.50–0.99)
GLUCOSE: 99 mg/dL (ref 65–99)
POTASSIUM: 3.8 mmol/L (ref 3.5–5.3)
Sodium: 142 mmol/L (ref 135–146)
Total Bilirubin: 0.6 mg/dL (ref 0.2–1.2)
Total Protein: 6.7 g/dL (ref 6.1–8.1)

## 2016-02-27 LAB — TSH: TSH: 2.65 m[IU]/L

## 2016-02-28 LAB — URINALYSIS, ROUTINE W REFLEX MICROSCOPIC
Bilirubin Urine: NEGATIVE
GLUCOSE, UA: NEGATIVE
Hgb urine dipstick: NEGATIVE
Ketones, ur: NEGATIVE
LEUKOCYTES UA: NEGATIVE
Nitrite: NEGATIVE
PROTEIN: NEGATIVE
Specific Gravity, Urine: 1.014 (ref 1.001–1.035)
pH: 6 (ref 5.0–8.0)

## 2016-02-28 LAB — HEMOGLOBIN A1C
HEMOGLOBIN A1C: 5.9 % — AB (ref <5.7)
Mean Plasma Glucose: 123 mg/dL

## 2016-02-28 LAB — MICROALBUMIN / CREATININE URINE RATIO
CREATININE, URINE: 217 mg/dL (ref 20–320)
MICROALB/CREAT RATIO: 3 ug/mg{creat} (ref <30)
Microalb, Ur: 0.7 mg/dL

## 2016-02-29 ENCOUNTER — Encounter: Payer: Self-pay | Admitting: Internal Medicine

## 2016-02-29 ENCOUNTER — Ambulatory Visit (INDEPENDENT_AMBULATORY_CARE_PROVIDER_SITE_OTHER): Payer: Medicare Other | Admitting: Internal Medicine

## 2016-02-29 VITALS — BP 122/78 | HR 74 | Temp 98.6°F | Ht 69.0 in | Wt 277.0 lb

## 2016-02-29 DIAGNOSIS — J301 Allergic rhinitis due to pollen: Secondary | ICD-10-CM

## 2016-02-29 DIAGNOSIS — R7303 Prediabetes: Secondary | ICD-10-CM | POA: Diagnosis not present

## 2016-02-29 DIAGNOSIS — I10 Essential (primary) hypertension: Secondary | ICD-10-CM

## 2016-02-29 DIAGNOSIS — G609 Hereditary and idiopathic neuropathy, unspecified: Secondary | ICD-10-CM | POA: Diagnosis not present

## 2016-02-29 DIAGNOSIS — E785 Hyperlipidemia, unspecified: Secondary | ICD-10-CM

## 2016-02-29 DIAGNOSIS — Z23 Encounter for immunization: Secondary | ICD-10-CM | POA: Diagnosis not present

## 2016-02-29 DIAGNOSIS — M159 Polyosteoarthritis, unspecified: Secondary | ICD-10-CM

## 2016-02-29 MED ORDER — SIMVASTATIN 20 MG PO TABS: ORAL_TABLET | ORAL | 3 refills | Status: DC

## 2016-02-29 MED ORDER — TRIAMTERENE-HCTZ 75-50 MG PO TABS: ORAL_TABLET | ORAL | 3 refills | Status: DC

## 2016-02-29 MED ORDER — BECLOMETHASONE DIPROPIONATE 80 MCG/ACT IN AERS: 1.0000 | INHALATION_SPRAY | RESPIRATORY_TRACT | 3 refills | Status: DC | PRN

## 2016-02-29 MED ORDER — FLUTICASONE FUROATE 27.5 MCG/SPRAY NA SUSP: 2.0000 | Freq: Two times a day (BID) | NASAL | 3 refills | Status: DC

## 2016-02-29 MED ORDER — OMEPRAZOLE 10 MG PO CPDR: DELAYED_RELEASE_CAPSULE | ORAL | 3 refills | Status: DC

## 2016-02-29 MED ORDER — FLUTICASONE PROPIONATE 50 MCG/ACT NA SUSP: NASAL | 3 refills | Status: DC

## 2016-02-29 NOTE — Progress Notes (Signed)
Patient ID: Cheryl [name], female   DOB: 09-23-1955, 60 y.o.   MRN: [id number]    Location:  [facility] Place of Service: OFFICE  Chief Complaint  Patient presents with  . Medical Management of Chronic Issues    4 month follow-up and discuss labs (Copy printed)     HPI:  60 yo female seen today for f/u. She went on vacation to [place], [place]. She had a lot of fun. She has left hip pain sine trip. She is getting left hip injections by Ortho. She had her mammogram earlier this year  Hyperlipidemia - stable. LDL 78  HTN - stable on maxzide. Takes ASA daily  DM - BS <100 most days. She checks CBGs 3 times per week. No low BS reactions. Occasional numbness/tingling in ands/feet. Metformin stopped by Endo Dr Dwyane Dee yesterday. A1c 6.2%. Endo told to f/u prn  GERD - stable on omeprazole  allergic rhinitis/asthma - she reports issues with seasonal allergy improved. She is taking shots, using nasal flonase and veramyst, Qvar inhaler. Allegra changed to xyzal. She does not use nasal saline spray. Also using ginger and lemon tea. She feels nauseated from post nasal drip.   Arthritis - pain stable on prn tramadol. She sees Dr Percell Miller (ortho). She had left knee pain and saw Dr Percell Miller who treated left hip bursitis with injection which helped knee.  Glaucoma - OU. She had laser procedure in March and April. Followed by Dr Arlina Robes at [facility] on [address]. Uses eye gtts, alaway prn    Past Medical History:  Diagnosis Date  . Allergic rhinitis, cause unspecified   . Arthritis   . Asthma   . Carpal tunnel syndrome   . Diabetes mellitus without complication (HCC)    diet- controlled  . GERD (gastroesophageal reflux disease)   . Hypertension   . Impaired fasting glucose   . Morbid obesity ([place])   . Other malaise and fatigue   . Other specified cardiac dysrhythmias(427.89)   . Pain in joint, site unspecified   . Symptomatic menopausal or female climacteric states     Past Surgical History:    Procedure Laterality Date  . ABDOMINAL HYSTERECTOMY     1994  . carpal tunnel both hands     2012  . CLOSED MANIPULATION SHOULDER Left 04/2014  . EYE SURGERY Left 08/25/2015  . EYE SURGERY Right 09/24/2015  . JOINT REPLACEMENT     both knees replacement, 2010  . mole removed from face    . Nodule removed from back    . SHOULDER SURGERY Left 11/2013  . TONSILLECTOMY     as teenager    Patient Care Team: [name], DO as PCP - General (Internal Medicine) [name], OD (Optometry)  Social History   Social History  . Marital status: Single    Spouse name: N/A  . Number of children: N/A  . Years of education: N/A   Occupational History  . Not on file.   Social History Main Topics  . Smoking status: Never Smoker  . Smokeless tobacco: Never Used  . Alcohol use No  . Drug use: No  . Sexual activity: No     Comment: post office worker   Other Topics Concern  . Not on file   Social History Narrative  . No narrative on file     reports that she has never smoked. She has never used smokeless tobacco. She reports that she does not drink alcohol or use drugs.  Family History  Problem Relation Age of Onset  . Cancer Father   . Diabetes Father   . Diabetes Brother   . Diabetes Paternal Aunt   . Lung cancer Cousin    Family Status  Relation Status  . Father Deceased at age 48  . Mother Deceased  . Brother Deceased  . Son Alive  . Brother Alive  . Brother Alive  . Son Alive  . Paternal Aunt   . Cousin Alive     Allergies  Allergen Reactions  . Oxycodone     Had stomach and headache as side effect from medicine  . Sulfur Diarrhea    Medications: Patient's Medications  New Prescriptions   No medications on file  Previous Medications   ACCU-CHEK AVIVA PLUS TEST STRIP    CHECK BLOOD SUGAR ONCE DAILY AS INSTRUCTED DX E11.9   AMBULATORY NON FORMULARY MEDICATION    Medication Name: Allergy Injection- once weekly   ASCORBIC ACID (VITAMIN C) 100 MG  TABLET    Take 1,000 mg by mouth daily.   ASPIRIN 81 MG TABLET    Take 81 mg by mouth daily.   B-D ULTRA-FINE 33 LANCETS MISC    1 each by Does not apply route as directed.   CALCIUM CARBONATE-VITAMIN D (CALTRATE 600+D) 600-400 MG-UNIT PER CHEW TABLET    Chew 1 tablet by mouth daily. Chew one tablet once a day   EPIPEN 2-PAK 0.3 MG/0.3ML SOAJ INJECTION       KETOTIFEN FUMARATE (ALAWAY OP)    Place into both eyes.    LEVOCETIRIZINE (XYZAL) 5 MG TABLET    Take 5 mg by mouth every evening.   MULTIPLE VITAMIN (MULTIVITAMIN) TABLET    Take 1 tablet by mouth daily. Take one tablet once a day   TRAMADOL (ULTRAM) 50 MG TABLET    Take 50 mg by mouth every 8 (eight) hours as needed for pain (take one tab every 8-12 hour as needed for pain).   VITAMIN E (VITAMIN E) 400 UNIT CAPSULE    Take 400 Units by mouth daily.  Modified Medications   Modified Medication Previous Medication   BECLOMETHASONE (QVAR) 80 MCG/ACT INHALER beclomethasone (QVAR) 80 MCG/ACT inhaler      Inhale 1 puff into the lungs as needed.    Inhale 1 puff into the lungs as needed.   FLUTICASONE (FLONASE) 50 MCG/ACT NASAL SPRAY fluticasone (FLONASE) 50 MCG/ACT nasal spray      One spray each nostril once daily for allergies    One spray each nostril once daily for allergies   FLUTICASONE (VERAMYST) 27.5 MCG/SPRAY NASAL SPRAY fluticasone (VERAMYST) 27.5 MCG/SPRAY nasal spray      Place 2 sprays into the nose 2 (two) times daily.    Place 2 sprays into the nose 2 (two) times daily.   OMEPRAZOLE (PRILOSEC) 10 MG CAPSULE omeprazole (PRILOSEC) 10 MG capsule      Take one capsule by mouth once daily for stomach    Take one capsule by mouth once daily for stomach   SIMVASTATIN (ZOCOR) 20 MG TABLET simvastatin (ZOCOR) 20 MG tablet      Take one tablet by mouth once daily at bedtime for cholesterol    Take one tablet by mouth once daily at bedtime for cholesterol   TRIAMTERENE-HYDROCHLOROTHIAZIDE (MAXZIDE) 75-50 MG TABLET  triamterene-hydrochlorothiazide (MAXZIDE) 75-50 MG tablet      Take one tablet by mouth once daily for blood pressure    Take one tablet by mouth once daily  for blood pressure  Discontinued Medications   METFORMIN (GLUCOPHAGE) 500 MG TABLET    Take 1 tablet (500 mg total) by mouth every other day.    Review of Systems  Musculoskeletal: Positive for arthralgias and gait problem.  Allergic/Immunologic: Positive for environmental allergies.  All other systems reviewed and are negative.   Vitals:   02/29/16 1346  BP: 122/78  Pulse: 74  Temp: 98.6 F (37 C)  TempSrc: Oral  SpO2: 96%  Weight: 277 lb (125.6 kg)  Height: 5' 9"  (1.753 m)   Body mass index is 40.91 kg/m.  Physical Exam  Constitutional: She is oriented to person, place, and time. She appears well-developed and well-nourished.  HENT:  Mouth/Throat: Oropharynx is clear and moist. No oropharyngeal exudate.  Eyes: Pupils are equal, round, and reactive to light. No scleral icterus.  Neck: Neck supple. Carotid bruit is not present. No tracheal deviation present.  Cardiovascular: Normal rate, regular rhythm, normal heart sounds and intact distal pulses.  Exam reveals no gallop and no friction rub.   No murmur heard. No LE edema b/l. no calf TTP.   Pulmonary/Chest: Effort normal and breath sounds normal. No stridor. No respiratory distress. She has no wheezes. She has no rales.  Abdominal: Soft. Bowel sounds are normal. She exhibits no distension and no mass. There is no hepatomegaly. There is tenderness. There is no rebound and no guarding.  Musculoskeletal: She exhibits edema and tenderness.  Left greater trochanteric TTP with swelling and reduced left hip ROM   Lymphadenopathy:    She has no cervical adenopathy.  Neurological: She is alert and oriented to person, place, and time.  Skin: Skin is warm and dry. No rash noted.  Psychiatric: She has a normal mood and affect. Her behavior is normal. Judgment and thought content  normal.     Labs reviewed: Appointment on 02/27/2016  Component Date Value Ref Range Status  . Cholesterol 02/27/2016 167  125 - 200 mg/dL Final  . Triglycerides 02/27/2016 115  <150 mg/dL Final  . HDL 02/27/2016 53  >=46 mg/dL Final  . Total CHOL/HDL Ratio 02/27/2016 3.2  <=5.0 Ratio Final  . VLDL 02/27/2016 23  <30 mg/dL Final  . LDL Cholesterol 02/27/2016 91  <130 mg/dL Final   Comment:   Total Cholesterol/HDL Ratio:CHD Risk                        Coronary Heart Disease Risk Table                                        Men       Women          1/2 Average Risk              3.4        3.3              Average Risk              5.0        4.4           2X Average Risk              9.6        7.1           3X Average Risk  23.4       11.0 Use the calculated Patient Ratio above and the CHD Risk table  to determine the patient's CHD Risk.   . Hgb A1c MFr Bld 02/28/2016 5.9* <5.7 % Final   Comment:   For someone without known diabetes, a hemoglobin A1c value between 5.7% and 6.4% is consistent with prediabetes and should be confirmed with a follow-up test.   For someone with known diabetes, a value <7% indicates that their diabetes is well controlled. A1c targets should be individualized based on duration of diabetes, age, co-morbid conditions and other considerations.   This assay result is consistent with an increased risk of diabetes.   Currently, no consensus exists regarding use of hemoglobin A1c for diagnosis of diabetes in children.     . Mean Plasma Glucose 02/28/2016 123  mg/dL Final  . Sodium 02/27/2016 142  135 - 146 mmol/L Final  . Potassium 02/27/2016 3.8  3.5 - 5.3 mmol/L Final  . Chloride 02/27/2016 103  98 - 110 mmol/L Final  . CO2 02/27/2016 25  20 - 31 mmol/L Final  . Glucose, Bld 02/27/2016 99  65 - 99 mg/dL Final  . BUN 02/27/2016 9  7 - 25 mg/dL Final  . Creat 02/27/2016 0.79  0.50 - 0.99 mg/dL Final   Comment:   For patients > or = 62  years of age: The upper reference limit for Creatinine is approximately 13% higher for people identified as African-American.     . Total Bilirubin 02/27/2016 0.6  0.2 - 1.2 mg/dL Final  . Alkaline Phosphatase 02/27/2016 50  33 - 130 U/L Final  . AST 02/27/2016 17  10 - 35 U/L Final  . ALT 02/27/2016 12  6 - 29 U/L Final  . Total Protein 02/27/2016 6.7  6.1 - 8.1 g/dL Final  . Albumin 02/27/2016 4.4  3.6 - 5.1 g/dL Final  . Calcium 02/27/2016 10.0  8.6 - 10.4 mg/dL Final  . WBC 02/27/2016 6.5  3.8 - 10.8 K/uL Final  . RBC 02/27/2016 5.04  3.80 - 5.10 MIL/uL Final  . Hemoglobin 02/27/2016 13.9  11.7 - 15.5 g/dL Final  . HCT 02/27/2016 41.4  35.0 - 45.0 % Final  . MCV 02/27/2016 82.1  80.0 - 100.0 fL Final  . MCH 02/27/2016 27.6  27.0 - 33.0 pg Final  . MCHC 02/27/2016 33.6  32.0 - 36.0 g/dL Final  . RDW 02/27/2016 14.5  11.0 - 15.0 % Final  . Platelets 02/27/2016 355  140 - 400 K/uL Final  . MPV 02/27/2016 9.4  7.5 - 12.5 fL Final  . Neutro Abs 02/27/2016 3770  1,500 - 7,800 cells/uL Final  . Lymphs Abs 02/27/2016 1950  850 - 3,900 cells/uL Final  . Monocytes Absolute 02/27/2016 520  200 - 950 cells/uL Final  . Eosinophils Absolute 02/27/2016 195  15 - 500 cells/uL Final  . Basophils Absolute 02/27/2016 65  0 - 200 cells/uL Final  . Neutrophils Relative % 02/27/2016 58  % Final  . Lymphocytes Relative 02/27/2016 30  % Final  . Monocytes Relative 02/27/2016 8  % Final  . Eosinophils Relative 02/27/2016 3  % Final  . Basophils Relative 02/27/2016 1  % Final  . Smear Review 02/27/2016 Criteria for review not met   Final  . TSH 02/27/2016 2.65  mIU/L Final   Comment:   Reference Range   > or = 20 Years  0.40-4.50   Pregnancy Range First trimester  0.26-2.66 Second trimester 0.55-2.73 Third  trimester  0.43-2.91     . Color, Urine 02/28/2016 YELLOW  YELLOW Final  . APPearance 02/28/2016 CLOUDY* CLEAR Final  . Specific Gravity, Urine 02/28/2016 1.014  1.001 - 1.035 Final  . pH  02/28/2016 6.0  5.0 - 8.0 Final  . Glucose, UA 02/28/2016 NEGATIVE  NEGATIVE Final  . Bilirubin Urine 02/28/2016 NEGATIVE  NEGATIVE Final  . Ketones, ur 02/28/2016 NEGATIVE  NEGATIVE Final  . Hgb urine dipstick 02/28/2016 NEGATIVE  NEGATIVE Final  . Protein, ur 02/28/2016 NEGATIVE  NEGATIVE Final  . Nitrite 02/28/2016 NEGATIVE  NEGATIVE Final  . Leukocytes, UA 02/28/2016 NEGATIVE  NEGATIVE Final  . Creatinine, Urine 02/28/2016 217  20 - 320 mg/dL Final  . Microalb, Ur 02/28/2016 0.7  Not estab mg/dL Final  . Microalb Creat Ratio 02/28/2016 3  <30 mcg/mg creat Final   Comment: The ADA has defined abnormalities in albumin excretion as follows:           Category           Result                            (mcg/mg creatinine)                 Normal:    <30       Microalbuminuria:    30 - 299   Clinical albuminuria:    > or = 300   The ADA recommends that at least two of three specimens collected within a 3 - 6 month period be abnormal before considering a patient to be within a diagnostic category.       No results found.   Assessment/Plan   ICD-9-CM ICD-10-CM   1. Prediabetes - improved 790.29 R73.03   2. Essential hypertension, benign 401.1 I10   3. Generalized osteoarthrosis, involving multiple sites 715.09 M15.9   4. Hyperlipidemia LDL goal <100 272.4 E78.5   5. Hereditary and idiopathic peripheral neuropathy 356.9 G60.9   6. Chronic seasonal allergic rhinitis due to pollen 477.0 J30.1   7. Need for immunization against influenza V04.81 Z23 Flu Vaccine QUAD 36+ mos PF IM (Fluarix & Fluzone Quad PF)   Continue current medications as ordered  Follow up with specialists as scheduled  Flu shot given today  Follow up in 6 mos for CPE/ECG. Fasting labs prior to appt  [place] S. Perlie Gold  [facility] [address] [place], [place] [address] [phone] Cell (Monday-Friday 8 AM - 5 PM) [phone] After 5 PM and follow  prompts

## 2016-02-29 NOTE — Patient Instructions (Addendum)
Continue current medications as ordered  Follow up with specialists as scheduled  Flu shot given today  Follow up in 6 mos for CPE/ECG. Fasting labs prior to appt

## 2016-03-03 DIAGNOSIS — J301 Allergic rhinitis due to pollen: Secondary | ICD-10-CM | POA: Diagnosis not present

## 2016-03-03 DIAGNOSIS — J3089 Other allergic rhinitis: Secondary | ICD-10-CM | POA: Diagnosis not present

## 2016-03-10 DIAGNOSIS — J3089 Other allergic rhinitis: Secondary | ICD-10-CM | POA: Diagnosis not present

## 2016-03-10 DIAGNOSIS — J301 Allergic rhinitis due to pollen: Secondary | ICD-10-CM | POA: Diagnosis not present

## 2016-03-11 DIAGNOSIS — M7062 Trochanteric bursitis, left hip: Secondary | ICD-10-CM | POA: Diagnosis not present

## 2016-03-17 DIAGNOSIS — J3089 Other allergic rhinitis: Secondary | ICD-10-CM | POA: Diagnosis not present

## 2016-03-17 DIAGNOSIS — J301 Allergic rhinitis due to pollen: Secondary | ICD-10-CM | POA: Diagnosis not present

## 2016-03-25 DIAGNOSIS — J4530 Mild persistent asthma, uncomplicated: Secondary | ICD-10-CM | POA: Diagnosis not present

## 2016-03-25 DIAGNOSIS — H1045 Other chronic allergic conjunctivitis: Secondary | ICD-10-CM | POA: Diagnosis not present

## 2016-03-25 DIAGNOSIS — J301 Allergic rhinitis due to pollen: Secondary | ICD-10-CM | POA: Diagnosis not present

## 2016-03-25 DIAGNOSIS — J3089 Other allergic rhinitis: Secondary | ICD-10-CM | POA: Diagnosis not present

## 2016-03-31 DIAGNOSIS — J3089 Other allergic rhinitis: Secondary | ICD-10-CM | POA: Diagnosis not present

## 2016-03-31 DIAGNOSIS — J301 Allergic rhinitis due to pollen: Secondary | ICD-10-CM | POA: Diagnosis not present

## 2016-04-08 DIAGNOSIS — J3089 Other allergic rhinitis: Secondary | ICD-10-CM | POA: Diagnosis not present

## 2016-04-08 DIAGNOSIS — H40033 Anatomical narrow angle, bilateral: Secondary | ICD-10-CM | POA: Diagnosis not present

## 2016-04-08 DIAGNOSIS — J301 Allergic rhinitis due to pollen: Secondary | ICD-10-CM | POA: Diagnosis not present

## 2016-04-14 DIAGNOSIS — J3089 Other allergic rhinitis: Secondary | ICD-10-CM | POA: Diagnosis not present

## 2016-04-14 DIAGNOSIS — J301 Allergic rhinitis due to pollen: Secondary | ICD-10-CM | POA: Diagnosis not present

## 2016-04-21 DIAGNOSIS — J301 Allergic rhinitis due to pollen: Secondary | ICD-10-CM | POA: Diagnosis not present

## 2016-04-21 DIAGNOSIS — J3089 Other allergic rhinitis: Secondary | ICD-10-CM | POA: Diagnosis not present

## 2016-04-23 ENCOUNTER — Telehealth: Payer: Self-pay | Admitting: Internal Medicine

## 2016-04-23 NOTE — Telephone Encounter (Signed)
left msg asking pt to confirm this AWV w/ nurse. VDM (DD)

## 2016-04-28 DIAGNOSIS — J301 Allergic rhinitis due to pollen: Secondary | ICD-10-CM | POA: Diagnosis not present

## 2016-04-28 DIAGNOSIS — J3089 Other allergic rhinitis: Secondary | ICD-10-CM | POA: Diagnosis not present

## 2016-05-05 DIAGNOSIS — J3089 Other allergic rhinitis: Secondary | ICD-10-CM | POA: Diagnosis not present

## 2016-05-05 DIAGNOSIS — J301 Allergic rhinitis due to pollen: Secondary | ICD-10-CM | POA: Diagnosis not present

## 2016-05-12 DIAGNOSIS — J3089 Other allergic rhinitis: Secondary | ICD-10-CM | POA: Diagnosis not present

## 2016-05-12 DIAGNOSIS — J301 Allergic rhinitis due to pollen: Secondary | ICD-10-CM | POA: Diagnosis not present

## 2016-05-20 DIAGNOSIS — J3089 Other allergic rhinitis: Secondary | ICD-10-CM | POA: Diagnosis not present

## 2016-05-20 DIAGNOSIS — J301 Allergic rhinitis due to pollen: Secondary | ICD-10-CM | POA: Diagnosis not present

## 2016-05-23 DIAGNOSIS — J301 Allergic rhinitis due to pollen: Secondary | ICD-10-CM | POA: Diagnosis not present

## 2016-05-23 DIAGNOSIS — J3089 Other allergic rhinitis: Secondary | ICD-10-CM | POA: Diagnosis not present

## 2016-05-27 DIAGNOSIS — J301 Allergic rhinitis due to pollen: Secondary | ICD-10-CM | POA: Diagnosis not present

## 2016-05-27 DIAGNOSIS — J3089 Other allergic rhinitis: Secondary | ICD-10-CM | POA: Diagnosis not present

## 2016-05-28 ENCOUNTER — Telehealth: Payer: Self-pay | Admitting: *Deleted

## 2016-05-28 NOTE — Telephone Encounter (Signed)
She can try OTC muscle rubs/biofreeze; may take Tylenol ES max 4 grams in 24 hrs

## 2016-05-28 NOTE — Telephone Encounter (Signed)
Patient called and wanted to know what she can take OTC for Pain. She stated that Dr. Percell Miller stopped the Tramadol. Was trying Aleve with no relief and thinks it causes her BP to go up. Would like something OTC. Please Advise.

## 2016-05-28 NOTE — Telephone Encounter (Signed)
Patient notified and agreed.  

## 2016-06-02 DIAGNOSIS — J3089 Other allergic rhinitis: Secondary | ICD-10-CM | POA: Diagnosis not present

## 2016-06-02 DIAGNOSIS — J301 Allergic rhinitis due to pollen: Secondary | ICD-10-CM | POA: Diagnosis not present

## 2016-06-05 DIAGNOSIS — J3089 Other allergic rhinitis: Secondary | ICD-10-CM | POA: Diagnosis not present

## 2016-06-05 DIAGNOSIS — J301 Allergic rhinitis due to pollen: Secondary | ICD-10-CM | POA: Diagnosis not present

## 2016-06-09 DIAGNOSIS — J301 Allergic rhinitis due to pollen: Secondary | ICD-10-CM | POA: Diagnosis not present

## 2016-06-09 DIAGNOSIS — J3089 Other allergic rhinitis: Secondary | ICD-10-CM | POA: Diagnosis not present

## 2016-06-13 DIAGNOSIS — J301 Allergic rhinitis due to pollen: Secondary | ICD-10-CM | POA: Diagnosis not present

## 2016-06-13 DIAGNOSIS — J3089 Other allergic rhinitis: Secondary | ICD-10-CM | POA: Diagnosis not present

## 2016-06-16 DIAGNOSIS — J3089 Other allergic rhinitis: Secondary | ICD-10-CM | POA: Diagnosis not present

## 2016-06-16 DIAGNOSIS — J301 Allergic rhinitis due to pollen: Secondary | ICD-10-CM | POA: Diagnosis not present

## 2016-06-20 DIAGNOSIS — M7062 Trochanteric bursitis, left hip: Secondary | ICD-10-CM | POA: Diagnosis not present

## 2016-06-23 DIAGNOSIS — J301 Allergic rhinitis due to pollen: Secondary | ICD-10-CM | POA: Diagnosis not present

## 2016-06-23 DIAGNOSIS — J3089 Other allergic rhinitis: Secondary | ICD-10-CM | POA: Diagnosis not present

## 2016-06-30 DIAGNOSIS — J301 Allergic rhinitis due to pollen: Secondary | ICD-10-CM | POA: Diagnosis not present

## 2016-06-30 DIAGNOSIS — J3089 Other allergic rhinitis: Secondary | ICD-10-CM | POA: Diagnosis not present

## 2016-07-01 ENCOUNTER — Other Ambulatory Visit: Payer: Self-pay

## 2016-07-01 DIAGNOSIS — I10 Essential (primary) hypertension: Secondary | ICD-10-CM

## 2016-07-01 DIAGNOSIS — Z79899 Other long term (current) drug therapy: Secondary | ICD-10-CM

## 2016-07-01 DIAGNOSIS — E785 Hyperlipidemia, unspecified: Secondary | ICD-10-CM

## 2016-07-07 DIAGNOSIS — J301 Allergic rhinitis due to pollen: Secondary | ICD-10-CM | POA: Diagnosis not present

## 2016-07-07 DIAGNOSIS — J3089 Other allergic rhinitis: Secondary | ICD-10-CM | POA: Diagnosis not present

## 2016-07-10 ENCOUNTER — Encounter: Payer: Self-pay | Admitting: Internal Medicine

## 2016-07-14 DIAGNOSIS — J301 Allergic rhinitis due to pollen: Secondary | ICD-10-CM | POA: Diagnosis not present

## 2016-07-14 DIAGNOSIS — J3089 Other allergic rhinitis: Secondary | ICD-10-CM | POA: Diagnosis not present

## 2016-07-22 DIAGNOSIS — J301 Allergic rhinitis due to pollen: Secondary | ICD-10-CM | POA: Diagnosis not present

## 2016-07-22 DIAGNOSIS — J3089 Other allergic rhinitis: Secondary | ICD-10-CM | POA: Diagnosis not present

## 2016-07-28 DIAGNOSIS — J3089 Other allergic rhinitis: Secondary | ICD-10-CM | POA: Diagnosis not present

## 2016-07-28 DIAGNOSIS — J301 Allergic rhinitis due to pollen: Secondary | ICD-10-CM | POA: Diagnosis not present

## 2016-08-04 DIAGNOSIS — J301 Allergic rhinitis due to pollen: Secondary | ICD-10-CM | POA: Diagnosis not present

## 2016-08-04 DIAGNOSIS — J3089 Other allergic rhinitis: Secondary | ICD-10-CM | POA: Diagnosis not present

## 2016-08-06 DIAGNOSIS — H40033 Anatomical narrow angle, bilateral: Secondary | ICD-10-CM | POA: Diagnosis not present

## 2016-08-06 DIAGNOSIS — H25813 Combined forms of age-related cataract, bilateral: Secondary | ICD-10-CM | POA: Diagnosis not present

## 2016-08-06 LAB — HM DIABETES EYE EXAM

## 2016-08-11 DIAGNOSIS — J3089 Other allergic rhinitis: Secondary | ICD-10-CM | POA: Diagnosis not present

## 2016-08-11 DIAGNOSIS — J301 Allergic rhinitis due to pollen: Secondary | ICD-10-CM | POA: Diagnosis not present

## 2016-08-18 DIAGNOSIS — J3089 Other allergic rhinitis: Secondary | ICD-10-CM | POA: Diagnosis not present

## 2016-08-18 DIAGNOSIS — J301 Allergic rhinitis due to pollen: Secondary | ICD-10-CM | POA: Diagnosis not present

## 2016-08-25 DIAGNOSIS — J3089 Other allergic rhinitis: Secondary | ICD-10-CM | POA: Diagnosis not present

## 2016-08-25 DIAGNOSIS — J301 Allergic rhinitis due to pollen: Secondary | ICD-10-CM | POA: Diagnosis not present

## 2016-09-01 ENCOUNTER — Other Ambulatory Visit: Payer: Federal, State, Local not specified - PPO

## 2016-09-03 ENCOUNTER — Encounter: Payer: Federal, State, Local not specified - PPO | Admitting: Internal Medicine

## 2016-09-17 ENCOUNTER — Encounter: Payer: Self-pay | Admitting: Internal Medicine

## 2016-09-22 DIAGNOSIS — J301 Allergic rhinitis due to pollen: Secondary | ICD-10-CM | POA: Diagnosis not present

## 2016-09-22 DIAGNOSIS — J3089 Other allergic rhinitis: Secondary | ICD-10-CM | POA: Diagnosis not present

## 2016-09-24 ENCOUNTER — Ambulatory Visit: Payer: Federal, State, Local not specified - PPO | Admitting: Internal Medicine

## 2016-09-29 ENCOUNTER — Other Ambulatory Visit: Payer: Federal, State, Local not specified - PPO

## 2016-09-29 DIAGNOSIS — H25813 Combined forms of age-related cataract, bilateral: Secondary | ICD-10-CM | POA: Diagnosis not present

## 2016-09-29 DIAGNOSIS — J301 Allergic rhinitis due to pollen: Secondary | ICD-10-CM | POA: Diagnosis not present

## 2016-09-29 DIAGNOSIS — J3089 Other allergic rhinitis: Secondary | ICD-10-CM | POA: Diagnosis not present

## 2016-10-01 ENCOUNTER — Encounter: Payer: Federal, State, Local not specified - PPO | Admitting: Internal Medicine

## 2016-10-01 ENCOUNTER — Ambulatory Visit: Payer: Medicare Other

## 2016-10-06 DIAGNOSIS — J301 Allergic rhinitis due to pollen: Secondary | ICD-10-CM | POA: Diagnosis not present

## 2016-10-06 DIAGNOSIS — J3089 Other allergic rhinitis: Secondary | ICD-10-CM | POA: Diagnosis not present

## 2016-10-07 ENCOUNTER — Encounter: Payer: Self-pay | Admitting: Nurse Practitioner

## 2016-10-07 ENCOUNTER — Ambulatory Visit (INDEPENDENT_AMBULATORY_CARE_PROVIDER_SITE_OTHER): Payer: Medicare Other | Admitting: Nurse Practitioner

## 2016-10-07 VITALS — BP 124/82 | HR 79 | Temp 98.4°F | Resp 18 | Ht 69.0 in | Wt 286.2 lb

## 2016-10-07 DIAGNOSIS — Z9189 Other specified personal risk factors, not elsewhere classified: Secondary | ICD-10-CM | POA: Diagnosis not present

## 2016-10-07 DIAGNOSIS — R0609 Other forms of dyspnea: Secondary | ICD-10-CM

## 2016-10-07 NOTE — Progress Notes (Signed)
Careteam: Patient Care Team: [name], DO as PCP - General (Internal Medicine) [name], [facility])  Advanced Directive information Does Patient Have a Medical Advance Directive?: No, Would patient like information on creating a medical advance directive?: Yes (MAU/Ambulatory/Procedural Areas - Information given)  Allergies  Allergen Reactions  . Oxycodone     Had stomach and headache as side effect from medicine  . Sulfur Diarrhea    Chief Complaint  Patient presents with  . Acute Visit    Pt is being seen for clearence for eye surgery due to SOB. Pt states that she has noticed that she has SOB for last several month with activity.      HPI: Patient is a 61 y.o. female seen in the office today for clearance for cataract surgery. Pt has hx of asthma, htn, allergies, DM, GERD Pt is needing IV sedation and needs medical clearance.  Pt has been more short of breath with activity that started 3 months ago. She noticed she was more short of breath with her normal exercise. She was walking and after a short amount of time became short of breath and would stop. She has since stop doing exercises.  She has gained weight since last OV, previous weight of 277 lbs now 286 lbs.  Having no shortness of breath at this time. Does not have any problems at this time.  Pt reports her son has sleep apnea and reports she snores loudly at night (lives alone but when she was with relatives they reported this) also reported she stopped breathing during her sleep.  Reports she feels rested in the morning. Does not fall asleep during the day or take naps.   Has physical scheduled for next month with fasting lab work.   Review of Systems:  Review of Systems  Constitutional: Negative for activity change, appetite change, fatigue and unexpected weight change.  Respiratory: Positive for shortness of breath (with exercise. ). Negative for cough and wheezing.   Cardiovascular: Negative  for chest pain, palpitations and leg swelling.  Genitourinary: Negative for difficulty urinating.  Musculoskeletal: Positive for arthralgias and gait problem.  Skin: Negative for color change and wound.  Allergic/Immunologic: Positive for environmental allergies.  Neurological: Negative for dizziness, weakness and headaches.    Past Medical History:  Diagnosis Date  . Allergic rhinitis, cause unspecified   . Arthritis   . Asthma   . Carpal tunnel syndrome   . Diabetes mellitus without complication (HCC)    diet- controlled  . GERD (gastroesophageal reflux disease)   . Hypertension   . Impaired fasting glucose   . Morbid obesity ([place])   . Other malaise and fatigue   . Other specified cardiac dysrhythmias(427.89)   . Pain in joint, site unspecified   . Symptomatic menopausal or female climacteric states    Past Surgical History:  Procedure Laterality Date  . ABDOMINAL HYSTERECTOMY     1994  . carpal tunnel both hands     2012  . CLOSED MANIPULATION SHOULDER Left 04/2014  . EYE SURGERY Left 08/25/2015  . EYE SURGERY Right 09/24/2015  . JOINT REPLACEMENT     both knees replacement, 2010  . mole removed from face    . Nodule removed from back    . SHOULDER SURGERY Left 11/2013  . TONSILLECTOMY     as teenager   Social History:   reports that she has never smoked. She has never used smokeless tobacco. She reports that she does not drink  alcohol or use drugs.  Family History  Problem Relation Age of Onset  . Cancer Father   . Diabetes Father   . Diabetes Brother   . Diabetes Paternal Aunt   . Lung cancer Cousin     Medications: Patient's Medications  New Prescriptions   No medications on file  Previous Medications   ACCU-CHEK AVIVA PLUS TEST STRIP    CHECK BLOOD SUGAR ONCE DAILY AS INSTRUCTED DX E11.9   AMBULATORY NON FORMULARY MEDICATION    Medication Name: Allergy Injection- once weekly   ASCORBIC ACID (VITAMIN C) 100 MG TABLET    Take 1,000 mg by mouth daily.     ASPIRIN 81 MG TABLET    Take 81 mg by mouth daily.   B-D ULTRA-FINE 33 LANCETS MISC    1 each by Does not apply route as directed.   BECLOMETHASONE (QVAR) 80 MCG/ACT INHALER    Inhale 1 puff into the lungs as needed.   CALCIUM CARBONATE-VITAMIN D (CALTRATE 600+D) 600-400 MG-UNIT PER CHEW TABLET    Chew 1 tablet by mouth daily. Chew one tablet once a day   EPIPEN 2-PAK 0.3 MG/0.3ML SOAJ INJECTION       FLUTICASONE (FLONASE) 50 MCG/ACT NASAL SPRAY    One spray each nostril once daily for allergies   FLUTICASONE (VERAMYST) 27.5 MCG/SPRAY NASAL SPRAY    Place 2 sprays into the nose 2 (two) times daily.   KETOTIFEN FUMARATE (ALAWAY OP)    Place into both eyes.    LEVOCETIRIZINE (XYZAL) 5 MG TABLET    Take 5 mg by mouth every evening.   MULTIPLE VITAMIN (MULTIVITAMIN) TABLET    Take 1 tablet by mouth daily. Take one tablet once a day   OMEPRAZOLE (PRILOSEC) 10 MG CAPSULE    Take one capsule by mouth once daily for stomach   SIMVASTATIN (ZOCOR) 20 MG TABLET    Take one tablet by mouth once daily at bedtime for cholesterol   TRIAMTERENE-HYDROCHLOROTHIAZIDE (MAXZIDE) 75-50 MG TABLET    Take one tablet by mouth once daily for blood pressure   VITAMIN E (VITAMIN E) 400 UNIT CAPSULE    Take 400 Units by mouth daily.  Modified Medications   No medications on file  Discontinued Medications   No medications on file     Physical Exam:  Vitals:   10/07/16 0943  BP: 124/82  Pulse: 79  Resp: 18  Temp: 98.4 F (36.9 C)  TempSrc: Oral  SpO2: 97%  Weight: 286 lb 3.2 oz (129.8 kg)  Height: 5\' 9"  (1.753 m)   Body mass index is 42.26 kg/m.  Physical Exam  Constitutional: She is oriented to person, place, and time. She appears well-developed and well-nourished. No distress.  HENT:  Head: Normocephalic and atraumatic.  Eyes: Conjunctivae are normal. Pupils are equal, round, and reactive to light.  Neck: Normal range of motion. Neck supple.  Cardiovascular: Normal rate, regular rhythm and normal  heart sounds.   Pulmonary/Chest: Effort normal and breath sounds normal.  Abdominal: Bowel sounds are normal.  Musculoskeletal: She exhibits no edema.  Neurological: She is alert and oriented to person, place, and time.  Skin: Skin is warm and dry. She is not diaphoretic.  Psychiatric: She has a normal mood and affect.    Labs reviewed: Basic Metabolic Panel:  Recent Labs  10/24/15 1009 02/27/16 1137  NA 143 142  K 3.9 3.8  CL 100 103  CO2 25 25  GLUCOSE 88 99  BUN 14 9  CREATININE  0.80 0.79  CALCIUM 9.3 10.0  TSH  --  2.65   Liver Function Tests:  Recent Labs  10/24/15 1009 02/27/16 1137  AST 16 17  ALT 13 12  ALKPHOS 58 50  BILITOT 0.4 0.6  PROT 6.8 6.7  ALBUMIN 4.4 4.4   No results for input(s): LIPASE, AMYLASE in the last 8760 hours. No results for input(s): AMMONIA in the last 8760 hours. CBC:  Recent Labs  02/27/16 1137  WBC 6.5  NEUTROABS 3,770  HGB 13.9  HCT 41.4  MCV 82.1  PLT 355   Lipid Panel:  Recent Labs  10/24/15 1009 02/27/16 1137  CHOL 158 167  HDL 66 53  LDLCALC 78 91  TRIG 69 115  CHOLHDL 2.4 3.2   TSH:  Recent Labs  02/27/16 1137  TSH 2.65   A1C: Lab Results  Component Value Date   HGBA1C 5.9 (H) 02/27/2016     Assessment/Plan 1. DOE (dyspnea on exertion) -in the last 3 months pt has became short of breath with what was previously routine exercise. Using qvar daily  - Ambulatory referral to Pulmonology for further evaluation at this time  2. Morbid obesity ([place]) Weight loss encouraged., has gained weight since increase in shortness of breath with exercise. Discussed changing diet and educational material provided - Ambulatory referral to Pulmonology - Amb ref to Medical Nutrition Therapy-MNT for further education  3. At risk for sleep apnea - Ambulatory referral to Pulmonology  Needing medical clearance for complex cataracts with IV sedation. Will hold off on clearance until seen by pulmonary for evaluation  of shortness of breath and sleep apnea.   Carlos American. Harle Battiest  [facility] [phone] 8 am - 5 pm) [phone] (after hours)

## 2016-10-07 NOTE — Patient Instructions (Signed)
DASH Eating Plan DASH stands for "Dietary Approaches to Stop Hypertension." The DASH eating plan is a healthy eating plan that has been shown to reduce high blood pressure (hypertension). It may also reduce your risk for type 2 diabetes, heart disease, and stroke. The DASH eating plan may also help with weight loss. What are tips for following this plan? General guidelines  Avoid eating more than 2,300 mg (milligrams) of salt (sodium) a day. If you have hypertension, you may need to reduce your sodium intake to 1,500 mg a day.  Limit alcohol intake to no more than 1 drink a day for nonpregnant women and 2 drinks a day for men. One drink equals 12 oz of beer, 5 oz of wine, or 1 oz of hard liquor.  Work with your health care provider to maintain a healthy body weight or to lose weight. Ask what an ideal weight is for you.  Get at least 30 minutes of exercise that causes your heart to beat faster (aerobic exercise) most days of the week. Activities may include walking, swimming, or biking.  Work with your health care provider or diet and nutrition specialist (dietitian) to adjust your eating plan to your individual calorie needs. Reading food labels  Check food labels for the amount of sodium per serving. Choose foods with less than 5 percent of the Daily Value of sodium. Generally, foods with less than 300 mg of sodium per serving fit into this eating plan.  To find whole grains, look for the word "whole" as the first word in the ingredient list. Shopping  Buy products labeled as "low-sodium" or "no salt added."  Buy fresh foods. Avoid canned foods and premade or frozen meals. Cooking  Avoid adding salt when cooking. Use salt-free seasonings or herbs instead of table salt or sea salt. Check with your health care provider or pharmacist before using salt substitutes.  Do not fry foods. Cook foods using healthy methods such as baking, boiling, grilling, and broiling instead.  Cook with  heart-healthy oils, such as olive, canola, soybean, or sunflower oil. Meal planning   Eat a balanced diet that includes: ? 5 or more servings of fruits and vegetables each day. At each meal, try to fill half of your plate with fruits and vegetables. ? Up to 6-8 servings of whole grains each day. ? Less than 6 oz of lean meat, poultry, or fish each day. A 3-oz serving of meat is about the same size as a deck of cards. One egg equals 1 oz. ? 2 servings of low-fat dairy each day. ? A serving of nuts, seeds, or beans 5 times each week. ? Heart-healthy fats. Healthy fats called Omega-3 fatty acids are found in foods such as flaxseeds and coldwater fish, like sardines, salmon, and mackerel.  Limit how much you eat of the following: ? Canned or prepackaged foods. ? Food that is high in trans fat, such as fried foods. ? Food that is high in saturated fat, such as fatty meat. ? Sweets, desserts, sugary drinks, and other foods with added sugar. ? Full-fat dairy products.  Do not salt foods before eating.  Try to eat at least 2 vegetarian meals each week.  Eat more home-cooked food and less restaurant, buffet, and fast food.  When eating at a restaurant, ask that your food be prepared with less salt or no salt, if possible. What foods are recommended? The items listed may not be a complete list. Talk with your dietitian about what   dietary choices are best for you. Grains Whole-grain or whole-wheat bread. Whole-grain or whole-wheat pasta. Brown rice. Oatmeal. Quinoa. Bulgur. Whole-grain and low-sodium cereals. Pita bread. Low-fat, low-sodium crackers. Whole-wheat flour tortillas. Vegetables Fresh or frozen vegetables (raw, steamed, roasted, or grilled). Low-sodium or reduced-sodium tomato and vegetable juice. Low-sodium or reduced-sodium tomato sauce and tomato paste. Low-sodium or reduced-sodium canned vegetables. Fruits All fresh, dried, or frozen fruit. Canned fruit in natural juice (without  added sugar). Meat and other protein foods Skinless chicken or turkey. Ground chicken or turkey. Pork with fat trimmed off. Fish and seafood. Egg whites. Dried beans, peas, or lentils. Unsalted nuts, nut butters, and seeds. Unsalted canned beans. Lean cuts of beef with fat trimmed off. Low-sodium, lean deli meat. Dairy Low-fat (1%) or fat-free (skim) milk. Fat-free, low-fat, or reduced-fat cheeses. Nonfat, low-sodium ricotta or cottage cheese. Low-fat or nonfat yogurt. Low-fat, low-sodium cheese. Fats and oils Soft margarine without trans fats. Vegetable oil. Low-fat, reduced-fat, or light mayonnaise and salad dressings (reduced-sodium). Canola, safflower, olive, soybean, and sunflower oils. Avocado. Seasoning and other foods Herbs. Spices. Seasoning mixes without salt. Unsalted popcorn and pretzels. Fat-free sweets. What foods are not recommended? The items listed may not be a complete list. Talk with your dietitian about what dietary choices are best for you. Grains Baked goods made with fat, such as croissants, muffins, or some breads. Dry pasta or rice meal packs. Vegetables Creamed or fried vegetables. Vegetables in a cheese sauce. Regular canned vegetables (not low-sodium or reduced-sodium). Regular canned tomato sauce and paste (not low-sodium or reduced-sodium). Regular tomato and vegetable juice (not low-sodium or reduced-sodium). Pickles. Olives. Fruits Canned fruit in a light or heavy syrup. Fried fruit. Fruit in cream or butter sauce. Meat and other protein foods Fatty cuts of meat. Ribs. Fried meat. Bacon. Sausage. Bologna and other processed lunch meats. Salami. Fatback. Hotdogs. Bratwurst. Salted nuts and seeds. Canned beans with added salt. Canned or smoked fish. Whole eggs or egg yolks. Chicken or turkey with skin. Dairy Whole or 2% milk, cream, and half-and-half. Whole or full-fat cream cheese. Whole-fat or sweetened yogurt. Full-fat cheese. Nondairy creamers. Whipped toppings.  Processed cheese and cheese spreads. Fats and oils Butter. Stick margarine. Lard. Shortening. Ghee. Bacon fat. Tropical oils, such as coconut, palm kernel, or palm oil. Seasoning and other foods Salted popcorn and pretzels. Onion salt, garlic salt, seasoned salt, table salt, and sea salt. Worcestershire sauce. Tartar sauce. Barbecue sauce. Teriyaki sauce. Soy sauce, including reduced-sodium. Steak sauce. Canned and packaged gravies. Fish sauce. Oyster sauce. Cocktail sauce. Horseradish that you find on the shelf. Ketchup. Mustard. Meat flavorings and tenderizers. Bouillon cubes. Hot sauce and Tabasco sauce. Premade or packaged marinades. Premade or packaged taco seasonings. Relishes. Regular salad dressings. Where to find more information:  National Heart, Lung, and Blood Institute: www.nhlbi.nih.gov  American Heart Association: www.heart.org Summary  The DASH eating plan is a healthy eating plan that has been shown to reduce high blood pressure (hypertension). It may also reduce your risk for type 2 diabetes, heart disease, and stroke.  With the DASH eating plan, you should limit salt (sodium) intake to 2,300 mg a day. If you have hypertension, you may need to reduce your sodium intake to 1,500 mg a day.  When on the DASH eating plan, aim to eat more fresh fruits and vegetables, whole grains, lean proteins, low-fat dairy, and heart-healthy fats.  Work with your health care provider or diet and nutrition specialist (dietitian) to adjust your eating plan to your individual   calorie needs. This information is not intended to replace advice given to you by your health care provider. Make sure you discuss any questions you have with your health care provider. Document Released: 05/01/2011 Document Revised: 05/05/2016 Document Reviewed: 05/05/2016 Elsevier Interactive Patient Education  2017 [employer] Inc.  

## 2016-10-13 DIAGNOSIS — J3089 Other allergic rhinitis: Secondary | ICD-10-CM | POA: Diagnosis not present

## 2016-10-13 DIAGNOSIS — J301 Allergic rhinitis due to pollen: Secondary | ICD-10-CM | POA: Diagnosis not present

## 2016-10-21 DIAGNOSIS — J3089 Other allergic rhinitis: Secondary | ICD-10-CM | POA: Diagnosis not present

## 2016-10-21 DIAGNOSIS — J301 Allergic rhinitis due to pollen: Secondary | ICD-10-CM | POA: Diagnosis not present

## 2016-10-27 DIAGNOSIS — J301 Allergic rhinitis due to pollen: Secondary | ICD-10-CM | POA: Diagnosis not present

## 2016-10-27 DIAGNOSIS — J3089 Other allergic rhinitis: Secondary | ICD-10-CM | POA: Diagnosis not present

## 2016-11-03 DIAGNOSIS — J301 Allergic rhinitis due to pollen: Secondary | ICD-10-CM | POA: Diagnosis not present

## 2016-11-03 DIAGNOSIS — J3089 Other allergic rhinitis: Secondary | ICD-10-CM | POA: Diagnosis not present

## 2016-11-04 ENCOUNTER — Ambulatory Visit (INDEPENDENT_AMBULATORY_CARE_PROVIDER_SITE_OTHER): Payer: Medicare Other | Admitting: Pulmonary Disease

## 2016-11-04 ENCOUNTER — Ambulatory Visit (INDEPENDENT_AMBULATORY_CARE_PROVIDER_SITE_OTHER)
Admission: RE | Admit: 2016-11-04 | Discharge: 2016-11-04 | Disposition: A | Discharge status: Home / Self Care | Payer: Medicare Other | Source: Ambulatory Visit | Attending: Pulmonary Disease | Admitting: Pulmonary Disease

## 2016-11-04 ENCOUNTER — Encounter: Payer: Self-pay | Admitting: Pulmonary Disease

## 2016-11-04 VITALS — BP 130/78 | HR 69 | Ht 69.0 in | Wt 289.0 lb

## 2016-11-04 DIAGNOSIS — G4719 Other hypersomnia: Secondary | ICD-10-CM | POA: Diagnosis not present

## 2016-11-04 DIAGNOSIS — R0602 Shortness of breath: Secondary | ICD-10-CM | POA: Diagnosis not present

## 2016-11-04 LAB — NITRIC OXIDE: Nitric Oxide: 18

## 2016-11-04 NOTE — Patient Instructions (Signed)
I feel you should be able to tolerate the cataract surgery without any issue You should however get evaluated for sleep apnea before the surgery We'll get a chest x-ray today and schedule for split night sleep study We'll try to get results of the pulmonary function tests from Dr. Rush Landmark office If you don't have any recent studies then we will schedule you for pulmonary function tests.  Return to clinic in 3 months.

## 2016-11-04 NOTE — Progress Notes (Signed)
Cheryl [name]    [id number]    11/07/1955  Primary Care Physician:Carter, Brayton Layman, DO  Referring Physician: [name], [place], [place] [id number]  Chief complaint:  Consult for evaluation of asthma Clearance for cataract surgery  HPI: 61 year old with history of asthma, diabetes, hypertension, GERD, morbid obesity. She has a diagnosis of asthma for 5-6 years. She has been maintained for Qvar for the past couple of years with good control of symptoms. [facility] of mold, mildew, dust, pollen allergies. She gets allergy shots every week. She was told by Dr. Donneta Romberg that her asthma is under good control and he is considering taking her of the inhaled steroids.  She reports increased dyspnea when she traveled to [place] over the winter which she attributes to the cold temperature. She still does not feel back to baseline and has dyspnea on exertion. She does not have significant symptoms at rest, no cough, wheezing, sputum production. She has been told that she has witnessed apneas and has symptoms of snoring.  Pets: None Occupation: Retired Tour manager.  Exposures:Has been exposed to dust in her office. Smoking history: Never smoker  Outpatient Encounter Prescriptions as of 11/04/2016  Medication Sig  . ACCU-CHEK AVIVA PLUS test strip CHECK BLOOD SUGAR ONCE DAILY AS INSTRUCTED DX E11.9  . acetaminophen (TYLENOL) 500 MG tablet Take 500 mg by mouth daily.  . AMBULATORY NON FORMULARY MEDICATION Medication Name: Allergy Injection- once weekly  . Ascorbic Acid (VITAMIN C) 100 MG tablet Take 1,000 mg by mouth daily.  Marland Kitchen aspirin 81 MG tablet Take 81 mg by mouth daily.  . B-D ULTRA-FINE 33 LANCETS MISC 1 each by Does not apply route as directed.  . beclomethasone (QVAR) 80 MCG/ACT inhaler Inhale 1 puff into the lungs as needed.  . Calcium Carbonate-Vitamin D (CALTRATE 600+D) 600-400 MG-UNIT per chew  tablet Chew 1 tablet by mouth daily. Chew one tablet once a day  . EPIPEN 2-PAK 0.3 MG/0.3ML SOAJ injection   . fluticasone (FLONASE) 50 MCG/ACT nasal spray One spray each nostril once daily for allergies  . fluticasone (VERAMYST) 27.5 MCG/SPRAY nasal spray Place 2 sprays into the nose 2 (two) times daily.  Marland Kitchen Ketotifen Fumarate (ALAWAY OP) Place into both eyes.   Marland Kitchen levocetirizine (XYZAL) 5 MG tablet Take 5 mg by mouth every evening.  . Multiple Vitamin (MULTIVITAMIN) tablet Take 1 tablet by mouth daily. Take one tablet once a day  . omeprazole (PRILOSEC) 10 MG capsule Take one capsule by mouth once daily for stomach  . simvastatin (ZOCOR) 20 MG tablet Take one tablet by mouth once daily at bedtime for cholesterol  . triamterene-hydrochlorothiazide (MAXZIDE) 75-50 MG tablet Take one tablet by mouth once daily for blood pressure  . vitamin E (VITAMIN E) 400 UNIT capsule Take 400 Units by mouth daily.  Marland Kitchen PROLENSA 0.07 % SOLN INSTILL 1 DROP IN THE RIGHT EYE ONCE A DAY, STARTING THE DAY OF SURGERY, AFTER THE PROCEDURE   No facility-administered encounter medications on file as of 11/04/2016.     Allergies as of 11/04/2016 - Review Complete 11/04/2016  Allergen Reaction Noted  . Oxycodone  03/05/2012  . Sulfur Diarrhea 09/29/2011    Past Medical History:  Diagnosis Date  . Allergic rhinitis, cause unspecified   . Arthritis   . Asthma   . Carpal tunnel syndrome   . Diabetes mellitus without complication ([place])  diet- controlled  . GERD (gastroesophageal reflux disease)   . Hypertension   . Impaired fasting glucose   . Morbid obesity ([place])   . Other malaise and fatigue   . Other specified cardiac dysrhythmias(427.89)   . Pain in joint, site unspecified   . Symptomatic menopausal or female climacteric states     Past Surgical History:  Procedure Laterality Date  . ABDOMINAL HYSTERECTOMY     1994  . carpal tunnel both hands     2012  . CLOSED MANIPULATION SHOULDER Left 04/2014  .  EYE SURGERY Left 08/25/2015  . EYE SURGERY Right 09/24/2015  . JOINT REPLACEMENT     both knees replacement, 2010  . mole removed from face    . Nodule removed from back    . SHOULDER SURGERY Left 11/2013  . TONSILLECTOMY     as teenager    Family History  Problem Relation Age of Onset  . Cancer Father   . Diabetes Father   . Diabetes Brother   . Diabetes Paternal Aunt   . Lung cancer Cousin     Social History   Social History  . Marital status: Single    Spouse name: N/A  . Number of children: N/A  . Years of education: N/A   Occupational History  . Not on file.   Social History Main Topics  . Smoking status: Never Smoker  . Smokeless tobacco: Never Used  . Alcohol use No  . Drug use: No  . Sexual activity: No     Comment: post office worker   Other Topics Concern  . Not on file   Social History Narrative  . No narrative on file    Review of systems: Review of Systems  Constitutional: Negative for fever and chills.  HENT: Negative.   Eyes: Negative for blurred vision.  Respiratory: as per HPI  Cardiovascular: Negative for chest pain and palpitations.  Gastrointestinal: Negative for vomiting, diarrhea, blood per rectum. Genitourinary: Negative for dysuria, urgency, frequency and hematuria.  Musculoskeletal: Negative for myalgias, back pain and joint pain.  Skin: Negative for itching and rash.  Neurological: Negative for dizziness, tremors, focal weakness, seizures and loss of consciousness.  Endo/Heme/Allergies: Negative for environmental allergies.  Psychiatric/Behavioral: Negative for depression, suicidal ideas and hallucinations.  All other systems reviewed and are negative.  Physical Exam: Blood pressure 130/78, pulse 69, height 5\' 9"  (1.753 m), weight 289 lb (131.1 kg), SpO2 98 %. Gen:      No acute distress HEENT:  EOMI, sclera anicteric Neck:     No masses; no thyromegaly Lungs:    Clear to auscultation bilaterally; normal respiratory  effort CV:         Regular rate and rhythm; no murmurs Abd:      + bowel sounds; soft, non-tender; no palpable masses, no distension Ext:    No edema; adequate peripheral perfusion Skin:      Warm and dry; no rash Neuro: alert and oriented x 3 Psych: normal mood and affect  Data Reviewed: [facility] 11/04/16- 18  CT abdomen pelvis 03/13/08-clear lung images Chest x-ray 10/20/08-no acute cardiopulmonary abnormality Chest x-ray 03/17/09-no acute cardiopulmonary abnormality I reviewed all images personally.  Assessment:  Pulmonary clearance for cataract surgery Cheryl [name] appears to have mild persistent asthma which is well controlled on Qvar inhaler. She [facility] with plans to wean off the inhaled steroids eventually. She has some dyspnea on exertion which may be from weight gain,  deconditioning. I'll get a chest x-ray for further evaluation. We'll try to get any recent pulmonary function tests from Dr. Rush Landmark office. If there are none available we will order PFTs from our office.  I don't see any contraindication to cataract surgery from a lung standpoint as asthma appers to be  well controlled. However she would need to complete her evaluation for sleep apnea before undergoing anesthesia  Suspected sleep apnea She'll need evaluation with a split-night sleep study  Plan/Recommendations: - Get records from Dr. Rush Landmark office - CXR - Split night sleep study.   [name] MD [facility] Pager [phone] 11/04/2016, 2:48 PM  CC: [name], DO

## 2016-11-07 DIAGNOSIS — J3089 Other allergic rhinitis: Secondary | ICD-10-CM | POA: Diagnosis not present

## 2016-11-07 DIAGNOSIS — J301 Allergic rhinitis due to pollen: Secondary | ICD-10-CM | POA: Diagnosis not present

## 2016-11-10 ENCOUNTER — Ambulatory Visit (INDEPENDENT_AMBULATORY_CARE_PROVIDER_SITE_OTHER): Payer: Medicare Other

## 2016-11-10 VITALS — BP 120/60 | HR 75 | Temp 98.1°F | Ht 69.0 in | Wt 286.0 lb

## 2016-11-10 DIAGNOSIS — Z0000 Encounter for general adult medical examination without abnormal findings: Secondary | ICD-10-CM

## 2016-11-10 DIAGNOSIS — Z723 Lack of physical exercise: Secondary | ICD-10-CM | POA: Diagnosis not present

## 2016-11-10 DIAGNOSIS — J301 Allergic rhinitis due to pollen: Secondary | ICD-10-CM | POA: Diagnosis not present

## 2016-11-10 DIAGNOSIS — J3089 Other allergic rhinitis: Secondary | ICD-10-CM | POA: Diagnosis not present

## 2016-11-10 NOTE — Patient Instructions (Signed)
Cheryl [name] , Thank you for taking time to come for your Medicare Wellness Visit. I appreciate your ongoing commitment to your health goals. Please review the following plan we discussed and let me know if I can assist you in the future.   Screening recommendations/referrals: Colonoscopy up to date, due 05/17/2022 Mammogram due Bone Density up to date Recommended yearly ophthalmology/optometry visit for glaucoma screening and checkup Recommended yearly dental visit for hygiene and checkup  Vaccinations: Influenza vaccine up to date, due 02/28/2017 Pneumococcal vaccine 13 given today. Up to date Tdap vaccine up to date. Due 08/03/2021 Shingles vaccine up to date. Let us know when/if you want the new one  Advanced directives: Advance directive discussed with you today. I have provided a copy for you to complete at home and have notarized. Once this is complete please bring a copy in to our office so we can scan it into your chart.  Conditions/risks identified: None  Next appointment: Dr. Eulas Post 11/19/16 @ 2:45pm  Preventive Care 40-64 Years, Female Preventive care refers to lifestyle choices and visits with your health care provider that can promote health and wellness. What does preventive care include?  A yearly physical exam. This is also called an annual well check.  Dental exams once or twice a year.  Routine eye exams. Ask your health care provider how often you should have your eyes checked.  Personal lifestyle choices, including:  Daily care of your teeth and gums.  Regular physical activity.  Eating a healthy diet.  Avoiding tobacco and drug use.  Limiting alcohol use.  Practicing safe sex.  Taking low-dose aspirin daily starting at age 30.  Taking vitamin and mineral supplements as recommended by your health care provider. What happens during an annual well check? The services and screenings done by your health care provider during your annual well check will  depend on your age, overall health, lifestyle risk factors, and family history of disease. Counseling  Your health care provider may ask you questions about your:  Alcohol use.  Tobacco use.  Drug use.  Emotional well-being.  Home and relationship well-being.  Sexual activity.  Eating habits.  Work and work Statistician.  Method of birth control.  Menstrual cycle.  Pregnancy history. Screening  You may have the following tests or measurements:  Height, weight, and BMI.  Blood pressure.  Lipid and cholesterol levels. These may be checked every 5 years, or more frequently if you are over 61 years old.  Skin check.  Lung cancer screening. You may have this screening every year starting at age 49 if you have a 30-pack-year history of smoking and currently smoke or have quit within the past 15 years.  Fecal occult blood test (FOBT) of the stool. You may have this test every year starting at age 66.  Flexible sigmoidoscopy or colonoscopy. You may have a sigmoidoscopy every 5 years or a colonoscopy every 10 years starting at age 56.  Hepatitis C blood test.  Hepatitis B blood test.  Sexually transmitted disease (STD) testing.  Diabetes screening. This is done by checking your blood sugar (glucose) after you have not eaten for a while (fasting). You may have this done every 1-3 years.  Mammogram. This may be done every 1-2 years. Talk to your health care provider about when you should start having regular mammograms. This may depend on whether you have a family history of breast cancer.  BRCA-related cancer screening. This may be done if you have a family history  of breast, ovarian, tubal, or peritoneal cancers.  Pelvic exam and Pap test. This may be done every 3 years starting at age 4. Starting at age 20, this may be done every 5 years if you have a Pap test in combination with an HPV test.  Bone density scan. This is done to screen for osteoporosis. You may have  this scan if you are at high risk for osteoporosis. Discuss your test results, treatment options, and if necessary, the need for more tests with your health care provider. Vaccines  Your health care provider may recommend certain vaccines, such as:  Influenza vaccine. This is recommended every year.  Tetanus, diphtheria, and acellular pertussis (Tdap, Td) vaccine. You may need a Td booster every 10 years.  Zoster vaccine. You may need this after age 42.  Pneumococcal 13-valent conjugate (PCV13) vaccine. You may need this if you have certain conditions and were not previously vaccinated.  Pneumococcal polysaccharide (PPSV23) vaccine. You may need one or two doses if you smoke cigarettes or if you have certain conditions. Talk to your health care provider about which screenings and vaccines you need and how often you need them. This information is not intended to replace advice given to you by your health care provider. Make sure you discuss any questions you have with your health care provider. Document Released: 06/08/2015 Document Revised: 01/30/2016 Document Reviewed: 03/13/2015 Elsevier Interactive Patient Education  2017 [employer] Prevention in the Home Falls can cause injuries. They can happen to people of all ages. There are many things you can do to make your home safe and to help prevent falls. What can I do on the outside of my home?  Regularly fix the edges of walkways and driveways and fix any cracks.  Remove anything that might make you trip as you walk through a door, such as a raised step or threshold.  Trim any bushes or trees on the path to your home.  Use bright outdoor lighting.  Clear any walking paths of anything that might make someone trip, such as rocks or tools.  Regularly check to see if handrails are loose or broken. Make sure that both sides of any steps have handrails.  Any raised decks and porches should have guardrails on the  edges.  Have any leaves, snow, or ice cleared regularly.  Use sand or salt on walking paths during winter.  Clean up any spills in your garage right away. This includes oil or grease spills. What can I do in the bathroom?  Use night lights.  Install grab bars by the toilet and in the tub and shower. Do not use towel bars as grab bars.  Use non-skid mats or decals in the tub or shower.  If you need to sit down in the shower, use a plastic, non-slip stool.  Keep the floor dry. Clean up any water that spills on the floor as soon as it happens.  Remove soap buildup in the tub or shower regularly.  Attach bath mats securely with double-sided non-slip rug tape.  Do not have throw rugs and other things on the floor that can make you trip. What can I do in the bedroom?  Use night lights.  Make sure that you have a light by your bed that is easy to reach.  Do not use any sheets or blankets that are too big for your bed. They should not hang down onto the floor.  Have a firm chair that  has side arms. You can use this for support while you get dressed.  Do not have throw rugs and other things on the floor that can make you trip. What can I do in the kitchen?  Clean up any spills right away.  Avoid walking on wet floors.  Keep items that you use a lot in easy-to-reach places.  If you need to reach something above you, use a strong step stool that has a grab bar.  Keep electrical cords out of the way.  Do not use floor polish or wax that makes floors slippery. If you must use wax, use non-skid floor wax.  Do not have throw rugs and other things on the floor that can make you trip. What can I do with my stairs?  Do not leave any items on the stairs.  Make sure that there are handrails on both sides of the stairs and use them. Fix handrails that are broken or loose. Make sure that handrails are as long as the stairways.  Check any carpeting to make sure that it is firmly  attached to the stairs. Fix any carpet that is loose or worn.  Avoid having throw rugs at the top or bottom of the stairs. If you do have throw rugs, attach them to the floor with carpet tape.  Make sure that you have a light switch at the top of the stairs and the bottom of the stairs. If you do not have them, ask someone to add them for you. What else can I do to help prevent falls?  Wear shoes that:  Do not have high heels.  Have rubber bottoms.  Are comfortable and fit you well.  Are closed at the toe. Do not wear sandals.  If you use a stepladder:  Make sure that it is fully opened. Do not climb a closed stepladder.  Make sure that both sides of the stepladder are locked into place.  Ask someone to hold it for you, if possible.  Clearly mark and make sure that you can see:  Any grab bars or handrails.  First and last steps.  Where the edge of each step is.  Use tools that help you move around (mobility aids) if they are needed. These include:  Canes.  Walkers.  Scooters.  Crutches.  Turn on the lights when you go into a dark area. Replace any light bulbs as soon as they burn out.  Set up your furniture so you have a clear path. Avoid moving your furniture around.  If any of your floors are uneven, fix them.  If there are any pets around you, be aware of where they are.  Review your medicines with your doctor. Some medicines can make you feel dizzy. This can increase your chance of falling. Ask your doctor what other things that you can do to help prevent falls. This information is not intended to replace advice given to you by your health care provider. Make sure you discuss any questions you have with your health care provider. Document Released: 03/08/2009 Document Revised: 10/18/2015 Document Reviewed: 06/16/2014 Elsevier Interactive Patient Education  2017 [employer].

## 2016-11-10 NOTE — Progress Notes (Signed)
Subjective:   Cheryl [name] is a 60 y.o. female who presents for Medicare Annual (Subsequent) preventive examination.     Objective:     Vitals: BP 120/60 (BP Location: Right Arm, Patient Position: Sitting)   Pulse 75   Temp 98.1 F (36.7 C) (Oral)   Ht 5\' 9"  (1.753 m)   Wt 286 lb (129.7 kg)   SpO2 98%   BMI 42.23 kg/m   Body mass index is 42.23 kg/m.   Tobacco History  Smoking Status  . Never Smoker  Smokeless Tobacco  . Never Used     Counseling given: Not Answered   Past Medical History:  Diagnosis Date  . Allergic rhinitis, cause unspecified   . Arthritis   . Asthma   . Carpal tunnel syndrome   . Cataract   . Diabetes mellitus without complication (HCC)    diet- controlled  . GERD (gastroesophageal reflux disease)   . Glaucoma   . Hypertension   . Impaired fasting glucose   . Morbid obesity ([place])   . Other malaise and fatigue   . Other specified cardiac dysrhythmias(427.89)   . Pain in joint, site unspecified   . Symptomatic menopausal or female climacteric states    Past Surgical History:  Procedure Laterality Date  . ABDOMINAL HYSTERECTOMY     1994  . carpal tunnel both hands     2012  . CLOSED MANIPULATION SHOULDER Left 04/2014  . EYE SURGERY Left 08/25/2015  . EYE SURGERY Right 09/24/2015  . JOINT REPLACEMENT     both knees replacement, 2010  . mole removed from face    . Nodule removed from back    . SHOULDER SURGERY Left 11/2013  . TONSILLECTOMY     as teenager   Family History  Problem Relation Age of Onset  . Cancer Father   . Diabetes Father   . Diabetes Brother   . Diabetes Paternal Aunt   . Lung cancer Cousin    History  Sexual Activity  . Sexual activity: No    Comment: post office worker    Outpatient Encounter Prescriptions as of 11/10/2016  Medication Sig  . ACCU-CHEK AVIVA PLUS test strip CHECK BLOOD SUGAR ONCE DAILY AS INSTRUCTED DX E11.9  . acetaminophen (TYLENOL) 500 MG tablet Take 500 mg by mouth daily.  .  AMBULATORY NON FORMULARY MEDICATION Medication Name: Allergy Injection- once weekly  . Ascorbic Acid (VITAMIN C) 100 MG tablet Take 1,000 mg by mouth daily.  Marland Kitchen aspirin 81 MG tablet Take 81 mg by mouth daily.  . B-D ULTRA-FINE 33 LANCETS MISC 1 each by Does not apply route as directed.  . beclomethasone (QVAR) 80 MCG/ACT inhaler Inhale 1 puff into the lungs as needed.  . Calcium Carbonate-Vitamin D (CALTRATE 600+D) 600-400 MG-UNIT per chew tablet Chew 1 tablet by mouth daily. Chew one tablet once a day  . EPIPEN 2-PAK 0.3 MG/0.3ML SOAJ injection   . fluticasone (FLONASE) 50 MCG/ACT nasal spray One spray each nostril once daily for allergies  . fluticasone (VERAMYST) 27.5 MCG/SPRAY nasal spray Place 2 sprays into the nose 2 (two) times daily.  Marland Kitchen Ketotifen Fumarate (ALAWAY OP) Place into both eyes.   Marland Kitchen levocetirizine (XYZAL) 5 MG tablet Take 5 mg by mouth every evening.  . Multiple Vitamin (MULTIVITAMIN) tablet Take 1 tablet by mouth daily. Take one tablet once a day  . omeprazole (PRILOSEC) 10 MG capsule Take one capsule by mouth once daily for stomach  . PROLENSA 0.07 %  SOLN INSTILL 1 DROP IN THE RIGHT EYE ONCE A DAY, STARTING THE DAY OF SURGERY, AFTER THE PROCEDURE  . simvastatin (ZOCOR) 20 MG tablet Take one tablet by mouth once daily at bedtime for cholesterol  . triamterene-hydrochlorothiazide (MAXZIDE) 75-50 MG tablet Take one tablet by mouth once daily for blood pressure  . vitamin E (VITAMIN E) 400 UNIT capsule Take 400 Units by mouth daily.   No facility-administered encounter medications on file as of 11/10/2016.     Activities of Daily Living In your present state of health, do you have any difficulty performing the following activities: 11/10/2016  Hearing? Y  Vision? Y  Difficulty concentrating or making decisions? Y  Walking or climbing stairs? N  Dressing or bathing? N  Doing errands, shopping? N  Preparing Food and eating ? N  Using the Toilet? N  In the past six months,  have you accidently leaked urine? N  Do you have problems with loss of bowel control? N  Managing your Medications? N  Managing your Finances? N  Housekeeping or managing your Housekeeping? N  Some recent data might be hidden    Patient Care Team: [name], DO as PCP - General (Internal Medicine) [name], OD (Optometry)    Assessment:    Exercise Activities and Dietary recommendations Current Exercise Habits: Home exercise routine, Type of exercise: treadmill;walking, Time (Minutes): 20, Frequency (Times/Week): 7, Weekly Exercise (Minutes/Week): 140, Intensity: Mild, Exercise limited by: None identified  Goals    . Weight (lb) < 240 lb (108.9 kg)          Patient will eat smaller meals throughout the day and have portion control      Fall Risk Fall Risk  11/10/2016 10/07/2016 02/29/2016 10/26/2015 06/27/2015  Falls in the past year? No No No Yes Yes  Number falls in past yr: - - - 1 1  Injury with Fall? - - - No No   Depression Screen PHQ 2/9 Scores 11/10/2016 04/12/2015 02/14/2014 12/06/2012  PHQ - 2 Score 0 0 0 1  PHQ- 9 Score - - - -     Cognitive Function: within normal limits        Immunization History  Administered Date(s) Administered  . Influenza,inj,Quad PF,36+ Mos 02/08/2013, 02/23/2015, 02/29/2016  . Influenza-Unspecified 01/24/2014  . Pneumococcal Polysaccharide-23 05/03/2013  . Td 08/04/2011  . Zoster 05/26/2014   Screening Tests Health Maintenance  Topic Date Due  . Hepatitis C Screening  March 29, 1956  . HIV Screening  10/12/1968  . HEMOGLOBIN A1C  08/27/2016  . MAMMOGRAM  09/24/2016  . OPHTHALMOLOGY EXAM  10/09/2016  . PAP SMEAR  10/12/2016  . FOOT EXAM  10/24/2016  . INFLUENZA VACCINE  12/24/2016  . URINE MICROALBUMIN  02/26/2017  . PNEUMOCOCCAL POLYSACCHARIDE VACCINE (2) 05/03/2018  . COLONOSCOPY  03/11/2020  . TETANUS/TDAP  08/03/2021      Plan:    I have personally reviewed and addressed the Medicare Annual Wellness  questionnaire and have noted the following in the patient's chart:  A. Medical and social history B. Use of alcohol, tobacco or illicit drugs  C. Current medications and supplements D. Functional ability and status E.  Nutritional status F.  Physical activity G. Advance directives H. List of other physicians I.  Hospitalizations, surgeries, and ER visits in previous 12 months J.  [place] to include hearing, vision, cognitive, depression L. Referrals and appointments - none  In addition, I have reviewed and discussed with patient certain preventive protocols, quality  metrics, and best practice recommendations. A written personalized care plan for preventive services as well as general preventive health recommendations were provided to patient.  See attached scanned questionnaire for additional information.   Signed,   Rich Reining, RN Nurse Health Advisor   Quick Notes   Health Maintenance: pap smear and mammogram due- appointment made already.     Abnormal Screen: MMSE not done, pt over age 68     Patient Concerns: None     Nurse Concerns: Silver sneakers referral put in     I reviewed health advisor's note, was available for consultation and agree with the assessment and plan as written.    Tiffany L. Reed, D.O. [facility] [address], [place] [address] Cell Phone (Mon-Fri 8am-5pm):  [phone] On Call:  [phone] & follow prompts after 5pm & weekends Office Phone:  [phone] Office Fax:  [phone]

## 2016-11-17 ENCOUNTER — Ambulatory Visit: Payer: Medicare Other

## 2016-11-17 ENCOUNTER — Other Ambulatory Visit: Payer: Medicare Other

## 2016-11-17 DIAGNOSIS — E785 Hyperlipidemia, unspecified: Secondary | ICD-10-CM | POA: Diagnosis not present

## 2016-11-17 DIAGNOSIS — J301 Allergic rhinitis due to pollen: Secondary | ICD-10-CM | POA: Diagnosis not present

## 2016-11-17 DIAGNOSIS — I10 Essential (primary) hypertension: Secondary | ICD-10-CM | POA: Diagnosis not present

## 2016-11-17 DIAGNOSIS — J3089 Other allergic rhinitis: Secondary | ICD-10-CM | POA: Diagnosis not present

## 2016-11-17 DIAGNOSIS — Z79899 Other long term (current) drug therapy: Secondary | ICD-10-CM

## 2016-11-17 LAB — LIPID PANEL
CHOLESTEROL: 162 mg/dL (ref <200)
HDL: 54 mg/dL (ref >50)
LDL Cholesterol: 88 mg/dL (ref <100)
TRIGLYCERIDES: 99 mg/dL (ref <150)
Total CHOL/HDL Ratio: 3 Ratio (ref <5.0)
VLDL: 20 mg/dL (ref <30)

## 2016-11-17 LAB — CBC WITH DIFFERENTIAL/PLATELET
BASOS ABS: 0 {cells}/uL (ref 0–200)
Basophils Relative: 0 %
Eosinophils Absolute: 144 cells/uL (ref 15–500)
Eosinophils Relative: 2 %
HEMATOCRIT: 39.6 % (ref 35.0–45.0)
HEMOGLOBIN: 13.1 g/dL (ref 11.7–15.5)
LYMPHS ABS: 1800 {cells}/uL (ref 850–3900)
Lymphocytes Relative: 25 %
MCH: 27 pg (ref 27.0–33.0)
MCHC: 33.1 g/dL (ref 32.0–36.0)
MCV: 81.6 fL (ref 80.0–100.0)
MONO ABS: 576 {cells}/uL (ref 200–950)
MPV: 10.1 fL (ref 7.5–12.5)
Monocytes Relative: 8 %
Neutro Abs: 4680 cells/uL (ref 1500–7800)
Neutrophils Relative %: 65 %
Platelets: 378 10*3/uL (ref 140–400)
RBC: 4.85 MIL/uL (ref 3.80–5.10)
RDW: 15.5 % — ABNORMAL HIGH (ref 11.0–15.0)
WBC: 7.2 10*3/uL (ref 3.8–10.8)

## 2016-11-17 LAB — COMPLETE METABOLIC PANEL WITH GFR
ALBUMIN: 4.5 g/dL (ref 3.6–5.1)
ALK PHOS: 54 U/L (ref 33–130)
ALT: 12 U/L (ref 6–29)
AST: 17 U/L (ref 10–35)
BILIRUBIN TOTAL: 0.5 mg/dL (ref 0.2–1.2)
BUN: 8 mg/dL (ref 7–25)
CO2: 28 mmol/L (ref 20–31)
Calcium: 9.8 mg/dL (ref 8.6–10.4)
Chloride: 103 mmol/L (ref 98–110)
Creat: 0.85 mg/dL (ref 0.50–0.99)
GFR, EST AFRICAN AMERICAN: 84 mL/min (ref >=60)
GFR, EST NON AFRICAN AMERICAN: 73 mL/min (ref >=60)
Glucose, Bld: 101 mg/dL — ABNORMAL HIGH (ref 65–99)
Potassium: 3.9 mmol/L (ref 3.5–5.3)
Sodium: 142 mmol/L (ref 135–146)
Total Protein: 6.9 g/dL (ref 6.1–8.1)

## 2016-11-17 LAB — TSH: TSH: 3.07 mIU/L

## 2016-11-19 ENCOUNTER — Encounter: Payer: Self-pay | Admitting: Internal Medicine

## 2016-11-19 ENCOUNTER — Ambulatory Visit (INDEPENDENT_AMBULATORY_CARE_PROVIDER_SITE_OTHER): Payer: Medicare Other | Admitting: Internal Medicine

## 2016-11-19 DIAGNOSIS — I10 Essential (primary) hypertension: Secondary | ICD-10-CM | POA: Diagnosis not present

## 2016-11-19 DIAGNOSIS — M159 Polyosteoarthritis, unspecified: Secondary | ICD-10-CM

## 2016-11-19 DIAGNOSIS — E785 Hyperlipidemia, unspecified: Secondary | ICD-10-CM | POA: Diagnosis not present

## 2016-11-19 DIAGNOSIS — R0609 Other forms of dyspnea: Secondary | ICD-10-CM

## 2016-11-19 DIAGNOSIS — E119 Type 2 diabetes mellitus without complications: Secondary | ICD-10-CM | POA: Diagnosis not present

## 2016-11-19 DIAGNOSIS — J301 Allergic rhinitis due to pollen: Secondary | ICD-10-CM

## 2016-11-19 MED ORDER — ZOSTER VAC RECOMB ADJUVANTED 50 MCG/0.5ML IM SUSR: 0.5000 mL | Freq: Once | INTRAMUSCULAR | 1 refills | Status: AC

## 2016-11-19 NOTE — Progress Notes (Signed)
Patient ID: Cheryl [name], female   DOB: 11-11-1955, 61 y.o.   MRN: [id number]   Location:  [facility]  Place of Service:  OFFICE  Provider: Arletha Grippe, DO  Patient Care Team: [name], DO as PCP - General (Internal Medicine) [name], [facility])  Extended Emergency Contact Information Primary Emergency Contact: Cheeks,Deon Address: [address]          [place] [address] [name] Phone: [phone] Mobile Phone: [phone] Relation: Son  Code Status: FULL CODE Goals of Care: Advanced Directive information Advanced Directives 11/19/2016  Does Patient Have a Medical Advance Directive? No  Type of Advance Directive -  Does patient want to make changes to medical advance directive? -  Copy of [place] in Chart? -  Would patient like information on creating a medical advance directive? No - Patient declined     Chief Complaint  Patient presents with  . Medical Management of Chronic Issues    Extended Visit    HPI: Patient is a 61 y.o. female seen in today for an extended visit. She is c/a weight. She has gained 12 lbs since Oct 2017. She admits to poor exercise habits. She has DOE. She will be getting a sleep study. AWV NOTE FROM 11/10/16 REVIEWED  Hyperlipidemia - stable. LDL 78  HTN - stable on maxzide. Takes ASA daily  DM - BS <100 most days. She checks CBGs 3 times per week. No low BS reactions. Occasional numbness/tingling in ands/feet. Metformin stopped by Endo Dr Dwyane Dee. A1c 6.2%. Endo told to f/u prn  GERD - stable on omeprazole  allergic rhinitis/asthma - she reports issues with seasonal allergy improved. She is taking shots, using nasal flonase and veramyst, Qvar inhaler. Allegra changed to xyzal. She does not use nasal saline spray. Also using ginger and lemon tea. She feels nauseated from post nasal drip.   Arthritis - pain stable on prn tramadol. She sees Dr Percell Miller (ortho). She had left knee pain and  saw Dr Percell Miller who treated left hip bursitis with injection which helped knee.  Glaucoma - OU. She had laser procedure in March and April. Followed by Dr Arlina Robes at [facility] on [address]. Uses eye gtts, alaway prn. She plans to have cats with IOL.   Depression screen [facility] 2/9 11/19/2016 11/10/2016 04/12/2015 02/14/2014 12/06/2012  Decreased Interest 0 0 0 0 1  Down, Depressed, Hopeless 0 0 0 0 -  PHQ - 2 Score 0 0 0 0 1  Altered sleeping - - - - -  Tired, decreased energy - - - - -  Change in appetite - - - - -  Feeling bad or failure about yourself  - - - - -  Trouble concentrating - - - - -  Moving slowly or fidgety/restless - - - - -  Suicidal thoughts - - - - -  PHQ-9 Score - - - - -    Fall Risk  11/19/2016 11/10/2016 10/07/2016 02/29/2016 10/26/2015  Falls in the past year? No No No No Yes  Number falls in past yr: - - - - 1  Injury with Fall? - - - - No   No flowsheet data found.   Health Maintenance  Topic Date Due  . Hepatitis C Screening  [birth year]  . HIV Screening  10/12/1968  . HEMOGLOBIN A1C  08/27/2016  . MAMMOGRAM  09/24/2016  . OPHTHALMOLOGY EXAM  10/09/2016  . PAP SMEAR  10/12/2016  .  FOOT EXAM  10/24/2016  . INFLUENZA VACCINE  12/24/2016  . URINE MICROALBUMIN  02/26/2017  . PNEUMOCOCCAL POLYSACCHARIDE VACCINE (2) 05/03/2018  . COLONOSCOPY  03/11/2020  . TETANUS/TDAP  08/03/2021    Past Medical History:  Diagnosis Date  . Allergic rhinitis, cause unspecified   . Arthritis   . Asthma   . Carpal tunnel syndrome   . Cataract   . Diabetes mellitus without complication (HCC)    diet- controlled  . GERD (gastroesophageal reflux disease)   . Glaucoma   . Hypertension   . Impaired fasting glucose   . Morbid obesity ([place])   . Other malaise and fatigue   . Other specified cardiac dysrhythmias(427.89)   . Pain in joint, site unspecified   . Symptomatic menopausal or female climacteric states     Past Surgical History:  Procedure Laterality Date  .  ABDOMINAL HYSTERECTOMY     1994  . carpal tunnel both hands     2012  . CLOSED MANIPULATION SHOULDER Left 04/2014  . EYE SURGERY Left 08/25/2015  . EYE SURGERY Right 09/24/2015  . JOINT REPLACEMENT     both knees replacement, 2010  . mole removed from face    . Nodule removed from back    . SHOULDER SURGERY Left 11/2013  . TONSILLECTOMY     as teenager    Family History  Problem Relation Age of Onset  . Cancer Father   . Diabetes Father   . Diabetes Brother   . Diabetes Paternal Aunt   . Lung cancer Cousin    Family Status  Relation Status  . Father Deceased at age 22  . Mother Deceased  . Brother Deceased  . Son Alive  . Brother Alive  . Brother Alive  . Son Alive  . Ethlyn Daniels (Not Specified)  . Cousin Alive    Social History   Social History  . Marital status: Single    Spouse name: N/A  . Number of children: N/A  . Years of education: N/A   Occupational History  . Not on file.   Social History Main Topics  . Smoking status: Never Smoker  . Smokeless tobacco: Never Used  . Alcohol use No  . Drug use: No  . Sexual activity: No     Comment: post office worker   Other Topics Concern  . Not on file   Social History Narrative  . No narrative on file    Allergies  Allergen Reactions  . Oxycodone     Had stomach and headache as side effect from medicine  . Sulfur Diarrhea    Allergies as of 11/19/2016      Reactions   Oxycodone    Had stomach and headache as side effect from medicine   Sulfur Diarrhea      Medication List       Accurate as of 11/19/16  3:02 PM. Always use your most recent med list.          ACCU-CHEK AVIVA PLUS test strip Generic drug:  glucose blood CHECK BLOOD SUGAR ONCE DAILY AS INSTRUCTED DX E11.9   acetaminophen 500 MG tablet Commonly known as:  TYLENOL Take 500 mg by mouth daily.   ALAWAY OP Place into both eyes.   AMBULATORY NON FORMULARY MEDICATION Medication Name: Allergy Injection- once weekly     aspirin 81 MG tablet Take 81 mg by mouth daily.   B-D ULTRA-FINE 33 LANCETS Misc 1 each by Does not apply route as directed.  beclomethasone 80 MCG/ACT inhaler Commonly known as:  QVAR Inhale 1 puff into the lungs as needed.   CALTRATE 600+D 600-400 MG-UNIT chew tablet Generic drug:  Calcium Carbonate-Vitamin D Chew 1 tablet by mouth daily. Chew one tablet once a day   EPIPEN 2-PAK 0.3 mg/0.3 mL Soaj injection Generic drug:  EPINEPHrine   fluticasone 27.5 MCG/SPRAY nasal spray Commonly known as:  VERAMYST Place 2 sprays into the nose 2 (two) times daily.   fluticasone 50 MCG/ACT nasal spray Commonly known as:  FLONASE One spray each nostril once daily for allergies   levocetirizine 5 MG tablet Commonly known as:  XYZAL Take 5 mg by mouth every evening.   multivitamin tablet Take 1 tablet by mouth daily. Take one tablet once a day   omeprazole 10 MG capsule Commonly known as:  PRILOSEC Take one capsule by mouth once daily for stomach   PROLENSA 0.07 % Soln Generic drug:  Bromfenac Sodium INSTILL 1 DROP IN THE RIGHT EYE ONCE A DAY, STARTING THE DAY OF SURGERY, AFTER THE PROCEDURE   simvastatin 20 MG tablet Commonly known as:  ZOCOR Take one tablet by mouth once daily at bedtime for cholesterol   triamterene-hydrochlorothiazide 75-50 MG tablet Commonly known as:  MAXZIDE Take one tablet by mouth once daily for blood pressure   vitamin C 100 MG tablet Take 1,000 mg by mouth daily.   vitamin E 400 UNIT capsule Generic drug:  vitamin E Take 400 Units by mouth daily.   Zoster Vac Recomb Adjuvanted injection Commonly known as:  SHINGRIX Inject 0.5 mLs into the muscle once.        Review of Systems:  Review of Systems  Physical Exam: Vitals:   11/19/16 1447  BP: 118/72  Pulse: 78  Temp: 98.5 F (36.9 C)  TempSrc: Oral  SpO2: 98%  Weight: 289 lb 9.6 oz (131.4 kg)  Height: 5' 9"  (1.753 m)   Body mass index is 42.77 kg/m. Physical Exam   Constitutional: She is oriented to person, place, and time. She appears well-developed and well-nourished. No distress.  HENT:  Head: Normocephalic and atraumatic.  Right Ear: External ear normal.  Left Ear: External ear normal.  Mouth/Throat: Oropharynx is clear and moist. No oropharyngeal exudate.  MMM; no oral thrush  Eyes: EOM are normal. Pupils are equal, round, and reactive to light. No scleral icterus.  Neck: Normal range of motion. Neck supple. Carotid bruit is not present. No tracheal deviation present. No thyromegaly present.  Cardiovascular: Normal rate, regular rhythm, normal heart sounds and intact distal pulses.  Exam reveals no gallop and no friction rub.   No murmur heard. No LE edema b/l. no calf TTP.   Pulmonary/Chest: Effort normal and breath sounds normal. No stridor. No respiratory distress. She has no wheezes. She has no rales. She exhibits no tenderness. Right breast exhibits no inverted nipple, no mass, no nipple discharge, no skin change and no tenderness. Left breast exhibits no inverted nipple, no mass, no nipple discharge, no skin change and no tenderness. Breasts are symmetrical.    No rhonchi  Abdominal: Soft. Bowel sounds are normal. She exhibits no distension and no mass. There is no hepatosplenomegaly or hepatomegaly. There is no tenderness. There is no rebound and no guarding. No hernia.  Musculoskeletal: She exhibits edema. She exhibits no deformity.       Left shoulder: She exhibits decreased range of motion, tenderness, swelling and decreased strength.  Lymphadenopathy:    She has no cervical adenopathy.  Neurological: She is alert and oriented to person, place, and time.  Skin: Skin is warm and dry. No rash noted.  Psychiatric: She has a normal mood and affect. Her behavior is normal. Judgment and thought content normal.  Vitals reviewed.  Diabetic Foot Exam - Simple   Simple Foot Form Diabetic Foot exam was performed with the following findings:   Yes 11/19/2016  5:25 PM  Visual Inspection No deformities, no ulcerations, no other skin breakdown bilaterally:  Yes See comments:  Yes Sensation Testing Intact to touch and monofilament testing bilaterally:  Yes Pulse Check Posterior Tibialis and Dorsalis pulse intact bilaterally:  Yes Comments Right plantar callus but no ulceration noted      Labs reviewed:  Basic Metabolic Panel:  Recent Labs  02/27/16 1137 11/17/16 1007  NA 142 142  K 3.8 3.9  CL 103 103  CO2 25 28  GLUCOSE 99 101*  BUN 9 8  CREATININE 0.79 0.85  CALCIUM 10.0 9.8  TSH 2.65 3.07   Liver Function Tests:  Recent Labs  02/27/16 1137 11/17/16 1007  AST 17 17  ALT 12 12  ALKPHOS 50 54  BILITOT 0.6 0.5  PROT 6.7 6.9  ALBUMIN 4.4 4.5   No results for input(s): LIPASE, AMYLASE in the last 8760 hours. No results for input(s): AMMONIA in the last 8760 hours. CBC:  Recent Labs  02/27/16 1137 11/17/16 1007  WBC 6.5 7.2  NEUTROABS 3,770 4,680  HGB 13.9 13.1  HCT 41.4 39.6  MCV 82.1 81.6  PLT 355 378   Lipid Panel:  Recent Labs  02/27/16 1137 11/17/16 1007  CHOL 167 162  HDL 53 54  LDLCALC 91 88  TRIG 115 99  CHOLHDL 3.2 3.0   Lab Results  Component Value Date   HGBA1C 5.9 (H) 02/27/2016    Procedures: Dg Chest 2 View  Result Date: 11/04/2016 CLINICAL DATA:  Six month history of shortness of breath. EXAM: CHEST  2 VIEW COMPARISON:  03/17/2009 FINDINGS: Lungs are hyperexpanded. The lungs are clear without focal pneumonia, edema, pneumothorax or pleural effusion. Retrocardiac atelectasis or scarring is similar to prior. Cardiopericardial silhouette is at upper limits of normal for size. The visualized bony structures of the thorax are intact. IMPRESSION: Stable.  No acute cardiopulmonary findings. Electronically Signed   By: Misty Stanley M.D.   On: 11/04/2016 17:04   ECG OBTAINED AND REVIEWED BY MYSELF: NST @ 78 bpm, nml axis, LAE. No acute ischemic changes. No change since  09/2013  Assessment/Plan   ICD-10-CM   1. Morbid obesity ([place]) E66.01   2. DOE (dyspnea on exertion) R06.09   3. Type 2 diabetes mellitus without complication, without long-term current use of insulin (HCC) E11.9 BMP with eGFR    ALT    Hemoglobin A1c    Microalbumin/Creatinine Ratio, Urine  4. Hyperlipidemia LDL goal <100 E78.5 Lipid Panel  5. Chronic seasonal allergic rhinitis due to pollen J30.1   6. Essential hypertension, benign I10   7. Generalized osteoarthrosis, involving multiple sites M15.9    START 1200 calorie diet  Increase exercise 30-45 min 4-5 days per week  Continue current medications as ordered  Follow up with eye specialist as scheduled  Follow up in 6 mos for obesity, arthritis, HTN, DM  Keeping You Healthy handout given   Cordella Register. Perlie Gold  [facility] [address] [place], [place] [address] [phone] Cell (Monday-Friday 8 AM - 5 PM) (  [phone] After 5 PM and follow prompts

## 2016-11-19 NOTE — Patient Instructions (Addendum)
START 1200 calorie diet  Increase exercise 30-45 min 4-5 days per week  Continue current medications as ordered  Follow up with eye specialist as scheduled  Follow up in 6 mos for obesity, arthritis, HTN, DM  Keeping You Healthy  Get These Tests  Blood Pressure- Have your blood pressure checked by your healthcare provider at least once a year.  Normal blood pressure is 120/80.  Weight- Have your body mass index (BMI) calculated to screen for obesity.  BMI is a measure of body fat based on height and weight.  You can calculate your own BMI at [employer].it  Cholesterol- Have your cholesterol checked every year.  Diabetes- Have your blood sugar checked every year if you have high blood pressure, high cholesterol, a family history of diabetes or if you are overweight.  Pap Test - Have a pap test every 1 to 5 years if you have been sexually active.  If you are older than 65 and recent pap tests have been normal you may not need additional pap tests.  In addition, if you have had a hysterectomy  for benign disease additional pap tests are not necessary.  Mammogram-Yearly mammograms are essential for early detection of breast cancer  Screening for Colon Cancer- Colonoscopy starting at age 53. Screening may begin sooner depending on your family history and other health conditions.  Follow up colonoscopy as directed by your Gastroenterologist.  Screening for Osteoporosis- Screening begins at age 63 with bone density scanning, sooner if you are at higher risk for developing Osteoporosis.  Get these medicines  Calcium with Vitamin D- Your body requires 1200-1500 mg of Calcium a day and [phone] IU of Vitamin D a day.  You can only absorb 500 mg of Calcium at a time therefore Calcium must be taken in 2 or 3 separate doses throughout the day.  Hormones- Hormone therapy has been associated with increased risk for certain cancers and heart disease.  Talk to your healthcare provider  about if you need relief from menopausal symptoms.  Aspirin- Ask your healthcare provider about taking Aspirin to prevent Heart Disease and Stroke.  Get these Immuniztions  Flu shot- Every fall  Pneumonia shot- Once after the age of 13; if you are younger ask your healthcare provider if you need a pneumonia shot.  Tetanus- Every ten years.  Zostavax- Once after the age of 52 to prevent shingles.  Take these steps  Don't smoke- Your healthcare provider can help you quit. For tips on how to quit, ask your healthcare provider or go to www.smokefree.gov or call 1-800 QUIT-NOW.  Be physically active- Exercise 5 days a week for a minimum of 30 minutes.  If you are not already physically active, start slow and gradually work up to 30 minutes of moderate physical activity.  Try walking, dancing, bike riding, swimming, etc.  Eat a healthy diet- Eat a variety of healthy foods such as fruits, vegetables, whole grains, low fat milk, low fat cheeses, yogurt, lean meats, chicken, fish, eggs, dried beans, tofu, etc.  For more information go to www.thenutritionsource.org  Dental visit- Brush and floss teeth twice daily; visit your dentist twice a year.  Eye exam- Visit your Optometrist or Ophthalmologist yearly.  Drink alcohol in moderation- Limit alcohol intake to one drink or less a day.  Never drink and drive.  Depression- Your emotional health is as important as your physical health.  If you're feeling down or losing interest in things you normally enjoy, please talk to your healthcare  provider.  Seat Belts- can save your life; always wear one  Smoke/Carbon Monoxide detectors- These detectors need to be installed on the appropriate level of your home.  Replace batteries at least once a year.  Violence- If anyone is threatening or hurting you, please tell your healthcare provider.  Living Will/ Health care power of attorney- Discuss with your healthcare provider and family.

## 2016-11-24 DIAGNOSIS — J3089 Other allergic rhinitis: Secondary | ICD-10-CM | POA: Diagnosis not present

## 2016-11-24 DIAGNOSIS — M8580 Other specified disorders of bone density and structure, unspecified site: Secondary | ICD-10-CM | POA: Diagnosis not present

## 2016-11-24 DIAGNOSIS — Z01419 Encounter for gynecological examination (general) (routine) without abnormal findings: Secondary | ICD-10-CM | POA: Diagnosis not present

## 2016-11-24 DIAGNOSIS — Z1231 Encounter for screening mammogram for malignant neoplasm of breast: Secondary | ICD-10-CM | POA: Diagnosis not present

## 2016-11-24 DIAGNOSIS — J301 Allergic rhinitis due to pollen: Secondary | ICD-10-CM | POA: Diagnosis not present

## 2016-11-24 DIAGNOSIS — Z6841 Body Mass Index (BMI) 40.0 and over, adult: Secondary | ICD-10-CM | POA: Diagnosis not present

## 2016-11-24 DIAGNOSIS — L659 Nonscarring hair loss, unspecified: Secondary | ICD-10-CM | POA: Diagnosis not present

## 2016-11-24 LAB — HM MAMMOGRAPHY: HM MAMMO: NORMAL (ref 0–4)

## 2016-11-25 ENCOUNTER — Ambulatory Visit (HOSPITAL_BASED_OUTPATIENT_CLINIC_OR_DEPARTMENT_OTHER): Payer: Medicare Other | Attending: Pulmonary Disease | Admitting: Pulmonary Disease

## 2016-11-25 VITALS — Ht 69.0 in | Wt 286.0 lb

## 2016-11-25 DIAGNOSIS — G4710 Hypersomnia, unspecified: Secondary | ICD-10-CM | POA: Insufficient documentation

## 2016-11-25 DIAGNOSIS — G4719 Other hypersomnia: Secondary | ICD-10-CM

## 2016-11-25 DIAGNOSIS — G4733 Obstructive sleep apnea (adult) (pediatric): Secondary | ICD-10-CM | POA: Insufficient documentation

## 2016-12-01 DIAGNOSIS — J3089 Other allergic rhinitis: Secondary | ICD-10-CM | POA: Diagnosis not present

## 2016-12-01 DIAGNOSIS — J301 Allergic rhinitis due to pollen: Secondary | ICD-10-CM | POA: Diagnosis not present

## 2016-12-02 ENCOUNTER — Encounter (HOSPITAL_BASED_OUTPATIENT_CLINIC_OR_DEPARTMENT_OTHER): Payer: Self-pay | Admitting: Pulmonary Disease

## 2016-12-02 DIAGNOSIS — G4733 Obstructive sleep apnea (adult) (pediatric): Secondary | ICD-10-CM | POA: Diagnosis not present

## 2016-12-02 HISTORY — DX: Obstructive sleep apnea (adult) (pediatric): G47.33

## 2016-12-02 NOTE — Procedures (Signed)
    Patient Name: Cheryl [name], Cheryl [name] Date: 11/25/2016 Gender: Female D.O.B: 03/23/1956 Age (years): 14 Referring Provider: [name] Height (inches): 65 Interpreting Physician: Chesley Mires MD, ABSM Weight (lbs): 286 RPSGT: Zadie Rhine BMI: 42 MRN: [id number] Neck Size: 16.50  CLINICAL INFORMATION Sleep Study Type: NPSG  Indication for sleep study: Excessive Daytime Sleepiness, Snoring  Epworth Sleepiness Score: 11  SLEEP STUDY TECHNIQUE As per the AASM Manual for the Scoring of Sleep and Associated Events v2.3 (April 2016) with a hypopnea requiring 4% desaturations.  The channels recorded and monitored were frontal, central and occipital EEG, electrooculogram (EOG), submentalis EMG (chin), nasal and oral airflow, thoracic and abdominal wall motion, anterior tibialis EMG, snore microphone, electrocardiogram, and pulse oximetry.  MEDICATIONS Medications self-administered by patient taken the night of the study : N/A  SLEEP ARCHITECTURE The study was initiated at 9:27:10 PM and ended at 4:30:16 AM.  Sleep onset time was 59.8 minutes and the sleep efficiency was 46.4%. The total sleep time was 196.5 minutes.  Stage REM latency was 225.5 minutes.  The patient spent 5.85% of the night in stage N1 sleep, 72.26% in stage N2 sleep, 13.23% in stage N3 and 8.65% in REM.  Alpha intrusion was absent.  Supine sleep was 68.70%.  RESPIRATORY PARAMETERS The overall apnea/hypopnea index (AHI) was 40.3 per hour. There were 124 total apneas, including 124 obstructive, 0 central and 0 mixed apneas. There were 8 hypopneas and 97 RERAs.  The AHI during Stage REM sleep was 10.6 per hour.  AHI while supine was 58.7 per hour.  The mean oxygen saturation was 95.78%. The minimum SpO2 during sleep was 88.00%.  Soft snoring was noted during this study.  CARDIAC DATA The 2 lead EKG demonstrated sinus rhythm. The mean heart rate was 64.73 beats per minute. Other EKG findings include:  None.  LEG MOVEMENT DATA The total PLMS were 0 with a resulting PLMS index of 0.00. Associated arousal with leg movement index was 0.0 .  IMPRESSIONS - Severe obstructive sleep apnea occurred during this study (AHI = 40.3/h and SpO2 low 88%). - She did not meet split night protocol criteria due to insufficient sleep time at the beginning of the test.  DIAGNOSIS - Obstructive Sleep Apnea (327.23 [G47.33 ICD-10])  RECOMMENDATIONS - Additional therapies include weight loss, CPAP, oral appliance, or surgical assessment.  However, given the severity of her sleep apnea, CPAP would likely be the best first option.  She could either be started on auto CPAP with range of 5 to 15 cm H2O, or return the sleep lab for a full night titration study.  [Electronically signed] 12/02/2016 04:56 PM  Chesley Mires MD, [place], American Board of Sleep Medicine   NPI: [id number]

## 2016-12-08 DIAGNOSIS — J3089 Other allergic rhinitis: Secondary | ICD-10-CM | POA: Diagnosis not present

## 2016-12-08 DIAGNOSIS — J301 Allergic rhinitis due to pollen: Secondary | ICD-10-CM | POA: Diagnosis not present

## 2016-12-12 ENCOUNTER — Telehealth: Payer: Self-pay | Admitting: Pulmonary Disease

## 2016-12-12 DIAGNOSIS — G4733 Obstructive sleep apnea (adult) (pediatric): Secondary | ICD-10-CM

## 2016-12-12 NOTE — Telephone Encounter (Signed)
Sleep study 11/25/16 shows - Severe obstructive sleep apnea occurred during this study (AHI = 40.3/h and SpO2 low 88%). - She did not meet split night protocol criteria due to insufficient sleep time at the beginning of the test.  Start CPAP auto 5-15 cm. Please order  [name] MD [facility] Pager [phone] 12/12/2016, 6:07 AM

## 2016-12-15 DIAGNOSIS — J301 Allergic rhinitis due to pollen: Secondary | ICD-10-CM | POA: Diagnosis not present

## 2016-12-15 DIAGNOSIS — J3089 Other allergic rhinitis: Secondary | ICD-10-CM | POA: Diagnosis not present

## 2016-12-22 DIAGNOSIS — J3089 Other allergic rhinitis: Secondary | ICD-10-CM | POA: Diagnosis not present

## 2016-12-22 DIAGNOSIS — J301 Allergic rhinitis due to pollen: Secondary | ICD-10-CM | POA: Diagnosis not present

## 2016-12-22 NOTE — Telephone Encounter (Signed)
Pt is aware of results and voiced her understanding. Pt agreed to CPAP therapy. Order has been placed. Nothing further needed.

## 2016-12-22 NOTE — Addendum Note (Signed)
Addended by: Maryanna Shape A on: 12/22/2016 11:23 AM   Modules accepted: Orders

## 2016-12-25 DIAGNOSIS — D485 Neoplasm of uncertain behavior of skin: Secondary | ICD-10-CM | POA: Diagnosis not present

## 2016-12-25 DIAGNOSIS — L669 Cicatricial alopecia, unspecified: Secondary | ICD-10-CM | POA: Diagnosis not present

## 2016-12-29 DIAGNOSIS — J3089 Other allergic rhinitis: Secondary | ICD-10-CM | POA: Diagnosis not present

## 2016-12-29 DIAGNOSIS — J301 Allergic rhinitis due to pollen: Secondary | ICD-10-CM | POA: Diagnosis not present

## 2016-12-31 ENCOUNTER — Encounter: Payer: Self-pay | Admitting: Internal Medicine

## 2016-12-31 ENCOUNTER — Ambulatory Visit (INDEPENDENT_AMBULATORY_CARE_PROVIDER_SITE_OTHER): Payer: Medicare Other | Admitting: Internal Medicine

## 2016-12-31 DIAGNOSIS — G4733 Obstructive sleep apnea (adult) (pediatric): Secondary | ICD-10-CM | POA: Diagnosis not present

## 2016-12-31 NOTE — Assessment & Plan Note (Signed)
I reviewed the medical issues and available treatments for obstructive sleep apnea, emphasizing the importance of maintaining normal weight, driving safety. She has some uncertainty about CPAP but it is the right treatment to start with and I discussed comfort measures. She can go ahead with her cataract surgery.

## 2016-12-31 NOTE — Patient Instructions (Signed)
Go ahead with your appointment to meet at [facility] to get your CPAP machine and pick a mask  Our goal is to use it any time you sleep. Over time, if you can lose weight, that will help.  Please call as needed  You can go ahead with your cataract surgery.

## 2016-12-31 NOTE — Progress Notes (Signed)
12/31/16-61 year old female never smoker for sleep evaluation. Referred courtesy of Sunday Spillers, NP-sleep study done and to be set up with CPAP through [facility] on Monday 01-05-17.  Medical problems include morbid obesity, DM 2, HBP, GERD, allergic rhinitis/allergy vaccine (Dr Donneta Romberg) CPAP auto 5-15 ordered by Dr Kimber Relic, to start next week with [facility] Follows with Dr. Kimber Relic for pulmonary- mild persistent asthma on Qvar, dyspnea on exertion from obesity and deconditioning. NPSG- 11/25/16- AHI 40.3/hour, desaturation to 88%, body weight 286 pounds Lives alone but visiting family had told her she snored loudly and stops breathing. She admits daytime sleepiness. She also admits gaining at least 20 pounds in the last year-less exercise and more eating. Son has CPAP. ENT surgery-tonsils. Denies heart disease. Pending cataract surgery. Not aware of movement disturbance or parasomnias.  Prior to Admission medications   Medication Sig Start Date End Date Taking? Authorizing Provider  ACCU-CHEK AVIVA PLUS test strip CHECK BLOOD SUGAR ONCE DAILY AS INSTRUCTED DX E11.9 09/24/15  Yes [name], NP  acetaminophen (TYLENOL) 500 MG tablet Take 500 mg by mouth daily.   Yes [provider]  AMBULATORY NON FORMULARY MEDICATION Medication Name: Allergy Injection- once weekly   Yes [provider]  Ascorbic Acid (VITAMIN C) 100 MG tablet Take 1,000 mg by mouth daily.   Yes [provider]  aspirin 81 MG tablet Take 81 mg by mouth daily.   Yes [provider]  B-D ULTRA-FINE 33 LANCETS MISC 1 each by Does not apply route as directed. 02/08/13  Yes Blanchie Serve, MD  beclomethasone (QVAR) 80 MCG/ACT inhaler Inhale 1 puff into the lungs as needed. 02/29/16  Yes [name], DO  Calcium Carbonate-Vitamin D (CALTRATE 600+D) 600-400 MG-UNIT per chew tablet Chew 1 tablet by mouth daily. Chew one tablet once a day   Yes [provider]  EPIPEN 2-PAK 0.3 MG/0.3ML SOAJ  injection  01/12/13  Yes [provider]  fluticasone (FLONASE) 50 MCG/ACT nasal spray One spray each nostril once daily for allergies 02/29/16  Yes Eulas Post, Monica, DO  fluticasone (VERAMYST) 27.5 MCG/SPRAY nasal spray Place 2 sprays into the nose 2 (two) times daily. 02/29/16  Yes Eulas Post, Monica, DO  Ketotifen Fumarate (ALAWAY OP) Place into both eyes.    Yes [provider]  levocetirizine (XYZAL) 5 MG tablet Take 5 mg by mouth every evening.   Yes [provider]  Multiple Vitamin (MULTIVITAMIN) tablet Take 1 tablet by mouth daily. Take one tablet once a day   Yes [provider]  omeprazole (PRILOSEC) 10 MG capsule Take one capsule by mouth once daily for stomach 02/29/16  Yes Carter, Monica, DO  PROLENSA 0.07 % SOLN INSTILL 1 DROP IN THE RIGHT EYE ONCE A DAY, STARTING THE DAY OF SURGERY, AFTER THE PROCEDURE 10/07/16  Yes [provider]  simvastatin (ZOCOR) 20 MG tablet Take one tablet by mouth once daily at bedtime for cholesterol 02/29/16  Yes [name], DO  triamterene-hydrochlorothiazide (MAXZIDE) 75-50 MG tablet Take one tablet by mouth once daily for blood pressure 02/29/16  Yes [name], DO  vitamin E (VITAMIN E) 400 UNIT capsule Take 400 Units by mouth daily.   Yes [provider]   Past Medical History:  Diagnosis Date  . Allergic rhinitis, cause unspecified   . Arthritis   . Asthma   . Carpal tunnel syndrome   . Cataract   . Diabetes mellitus without complication (HCC)    diet- controlled  . GERD (gastroesophageal reflux disease)   .  Glaucoma   . Hypertension   . Impaired fasting glucose   . Morbid obesity ([place])   . OSA (obstructive sleep apnea) 12/02/2016  . Other malaise and fatigue   . Other specified cardiac dysrhythmias(427.89)   . Pain in joint, site unspecified   . Symptomatic menopausal or female climacteric states    Past Surgical History:  Procedure Laterality Date  . ABDOMINAL HYSTERECTOMY     1994   . carpal tunnel both hands     2012  . CLOSED MANIPULATION SHOULDER Left 04/2014  . EYE SURGERY Left 08/25/2015  . EYE SURGERY Right 09/24/2015  . JOINT REPLACEMENT     both knees replacement, 2010  . mole removed from face    . Nodule removed from back    . SHOULDER SURGERY Left 11/2013  . TONSILLECTOMY     as teenager   Family History  Problem Relation Age of Onset  . Cancer Father   . Diabetes Father   . Diabetes Brother   . Diabetes Paternal Aunt   . Lung cancer Cousin    Social History   Social History  . Marital status: Single    Spouse name: N/A  . Number of children: N/A  . Years of education: N/A   Occupational History  . Not on file.   Social History Main Topics  . Smoking status: Never Smoker  . Smokeless tobacco: Never Used  . Alcohol use No  . Drug use: No  . Sexual activity: No     Comment: post office worker   Other Topics Concern  . Not on file   Social History Narrative  . No narrative on file   ROS-see HPI   Negative unless "+" Constitutional:    weight loss, night sweats, fevers, chills, fatigue, lassitude. HEENT:    headaches, difficulty swallowing, tooth/dental problems, sore throat,       sneezing, itching, ear ache, nasal congestion, post nasal drip, snoring CV:    chest pain, orthopnea, PND, swelling in lower extremities, anasarca,                                                     dizziness, palpitations Resp:   shortness of breath with exertion or at rest.                productive cough,   non-productive cough, coughing up of blood.              change in color of mucus.  wheezing.   Skin:    rash or lesions. GI:  No-   heartburn, indigestion, abdominal pain, nausea, vomiting, diarrhea,                 change in bowel habits, loss of appetite GU: dysuria, change in color of urine, no urgency or frequency.   flank pain. MS:   joint pain, stiffness, decreased range of motion, back pain. Neuro-     nothing unusual Psych:  change  in mood or affect.  depression or anxiety.   memory loss.  OBJ- Physical Exam General- Alert, Oriented, Affect-appropriate, Distress- none acute, + morbidly obese Skin- rash-none, lesions- none, excoriation- none Lymphadenopathy- none Head- atraumatic            Eyes- Gross vision intact, PERRLA, conjunctivae and secretions clear  Ears- Hearing, canals-normal            Nose- Clear, no-Septal dev, mucus, polyps, erosion, perforation             Throat- Mallampati IV , mucosa clear , drainage- none, tonsils- atrophic, + own teeth Neck- flexible , trachea midline, no stridor , thyroid nl, carotid no bruit Chest - symmetrical excursion , unlabored           Heart/CV- RRR , no murmur , no gallop  , no rub, nl s1 s2                           - JVD- none , edema- none, stasis changes- none, varices- none           Lung- clear to P&A, wheeze- none, cough- none , dullness-none, rub- none           Chest wall-  Abd-  Br/ Gen/ Rectal- Not done, not indicated Extrem- cyanosis- none, clubbing, none, atrophy- none, strength- nl Neuro- grossly intact to observation

## 2016-12-31 NOTE — Assessment & Plan Note (Signed)
She admits she is gaining weight, eating more and not exercising. Complicating comorbidities include her obstructive sleep apnea, HBP and DM. She may be a candidate for bariatric referral.

## 2017-01-02 DIAGNOSIS — L669 Cicatricial alopecia, unspecified: Secondary | ICD-10-CM | POA: Diagnosis not present

## 2017-01-05 DIAGNOSIS — J301 Allergic rhinitis due to pollen: Secondary | ICD-10-CM | POA: Diagnosis not present

## 2017-01-05 DIAGNOSIS — J3089 Other allergic rhinitis: Secondary | ICD-10-CM | POA: Diagnosis not present

## 2017-01-12 DIAGNOSIS — J301 Allergic rhinitis due to pollen: Secondary | ICD-10-CM | POA: Diagnosis not present

## 2017-01-12 DIAGNOSIS — J3089 Other allergic rhinitis: Secondary | ICD-10-CM | POA: Diagnosis not present

## 2017-01-19 DIAGNOSIS — J3089 Other allergic rhinitis: Secondary | ICD-10-CM | POA: Diagnosis not present

## 2017-01-19 DIAGNOSIS — J301 Allergic rhinitis due to pollen: Secondary | ICD-10-CM | POA: Diagnosis not present

## 2017-01-23 DIAGNOSIS — H40033 Anatomical narrow angle, bilateral: Secondary | ICD-10-CM | POA: Diagnosis not present

## 2017-01-23 DIAGNOSIS — H25812 Combined forms of age-related cataract, left eye: Secondary | ICD-10-CM | POA: Diagnosis not present

## 2017-01-23 DIAGNOSIS — H25811 Combined forms of age-related cataract, right eye: Secondary | ICD-10-CM | POA: Diagnosis not present

## 2017-01-27 DIAGNOSIS — J3089 Other allergic rhinitis: Secondary | ICD-10-CM | POA: Diagnosis not present

## 2017-01-27 DIAGNOSIS — J301 Allergic rhinitis due to pollen: Secondary | ICD-10-CM | POA: Diagnosis not present

## 2017-02-02 DIAGNOSIS — J301 Allergic rhinitis due to pollen: Secondary | ICD-10-CM | POA: Diagnosis not present

## 2017-02-02 DIAGNOSIS — J3089 Other allergic rhinitis: Secondary | ICD-10-CM | POA: Diagnosis not present

## 2017-02-09 DIAGNOSIS — J3089 Other allergic rhinitis: Secondary | ICD-10-CM | POA: Diagnosis not present

## 2017-02-09 DIAGNOSIS — J4530 Mild persistent asthma, uncomplicated: Secondary | ICD-10-CM | POA: Diagnosis not present

## 2017-02-09 DIAGNOSIS — J301 Allergic rhinitis due to pollen: Secondary | ICD-10-CM | POA: Diagnosis not present

## 2017-02-09 DIAGNOSIS — H1045 Other chronic allergic conjunctivitis: Secondary | ICD-10-CM | POA: Diagnosis not present

## 2017-02-10 ENCOUNTER — Ambulatory Visit (INDEPENDENT_AMBULATORY_CARE_PROVIDER_SITE_OTHER): Payer: Medicare Other | Admitting: Pulmonary Disease

## 2017-02-10 ENCOUNTER — Encounter: Payer: Self-pay | Admitting: Pulmonary Disease

## 2017-02-10 ENCOUNTER — Telehealth: Payer: Self-pay

## 2017-02-10 VITALS — BP 114/90 | HR 65 | Ht 68.0 in | Wt 291.0 lb

## 2017-02-10 DIAGNOSIS — J4530 Mild persistent asthma, uncomplicated: Secondary | ICD-10-CM

## 2017-02-10 DIAGNOSIS — J45909 Unspecified asthma, uncomplicated: Secondary | ICD-10-CM

## 2017-02-10 DIAGNOSIS — G4733 Obstructive sleep apnea (adult) (pediatric): Secondary | ICD-10-CM | POA: Diagnosis not present

## 2017-02-10 NOTE — Patient Instructions (Signed)
Continue using inhalers I am glad that you are tolerating the CPAP well. Continue treatment daily. You are cleared to undergo the cataract surgery Follow up in 6 months

## 2017-02-10 NOTE — Progress Notes (Addendum)
Cheryl [name]    [id number]    03-09-1956  Primary Care Physician:Carter, Brayton Layman, DO  Referring Physician: [name], [place], [place] [id number]  Chief complaint:   Follow up for asthma Clearance for cataract surgery OSA  HPI: 61 year old with history of asthma, diabetes, hypertension, GERD, morbid obesity. She has a diagnosis of asthma for 5-6 years. She has been maintained for Qvar for the past couple of years with good control of symptoms. She [facility] for treatment of mold, mildew, dust, pollen allergies. She gets allergy shots every week. She was told by Dr. Donneta Romberg that her asthma is under good control and he is considering taking her of the inhaled steroids.  She reports increased dyspnea when she traveled to [place] over the winter which she attributes to the cold temperature. She still does not feel back to baseline and has dyspnea on exertion. She does not have significant symptoms at rest, no cough, wheezing, sputum production. She has been told that she has witnessed apneas and has symptoms of snoring.  Pets: None Occupation: Retired Tour manager.  Exposures:Has been exposed to dust in her office. Smoking history: Never smoker  Flu vaccine 2018 Up to date with Pneumococcus vaccine at PCP.   Interim History: She's had a sleep study which showed severe sleep apnea. She has been started on AutoSet CPAP and is tolerating well. She feels that her dyspnea and sinus congestion has improved on the CPAP therapy. Her asthma symptoms are under good control. She continues on Qvar. She's already had a flu vaccine this year.  Outpatient Encounter Prescriptions as of 02/10/2017  Medication Sig  . ACCU-CHEK AVIVA PLUS test strip CHECK BLOOD SUGAR ONCE DAILY AS INSTRUCTED DX E11.9  . acetaminophen (TYLENOL) 500 MG tablet Take 500 mg by mouth daily.  . AMBULATORY NON FORMULARY MEDICATION Medication Name:  Allergy Injection- once weekly  . Ascorbic Acid (VITAMIN C) 100 MG tablet Take 1,000 mg by mouth daily.  Marland Kitchen aspirin 81 MG tablet Take 81 mg by mouth daily.  . B-D ULTRA-FINE 33 LANCETS MISC 1 each by Does not apply route as directed.  . beclomethasone (QVAR) 80 MCG/ACT inhaler Inhale 1 puff into the lungs as needed.  . Calcium Carbonate-Vitamin D (CALTRATE 600+D) 600-400 MG-UNIT per chew tablet Chew 1 tablet by mouth daily. Chew one tablet once a day  . EPIPEN 2-PAK 0.3 MG/0.3ML SOAJ injection   . fluticasone (FLONASE) 50 MCG/ACT nasal spray One spray each nostril once daily for allergies  . fluticasone (VERAMYST) 27.5 MCG/SPRAY nasal spray Place 2 sprays into the nose 2 (two) times daily.  Marland Kitchen Ketotifen Fumarate (ALAWAY OP) Place into both eyes.   Marland Kitchen levocetirizine (XYZAL) 5 MG tablet Take 5 mg by mouth every evening.  . Multiple Vitamin (MULTIVITAMIN) tablet Take 1 tablet by mouth daily. Take one tablet once a day  . omeprazole (PRILOSEC) 10 MG capsule Take one capsule by mouth once daily for stomach  . PROLENSA 0.07 % SOLN INSTILL 1 DROP IN THE RIGHT EYE ONCE A DAY, STARTING THE DAY OF SURGERY, AFTER THE PROCEDURE  . simvastatin (ZOCOR) 20 MG tablet Take one tablet by mouth once daily at bedtime for cholesterol  . triamterene-hydrochlorothiazide (MAXZIDE) 75-50 MG tablet Take one tablet by mouth once daily for blood pressure  . vitamin E (VITAMIN E) 400 UNIT capsule Take 400 Units by mouth daily.   No facility-administered  encounter medications on file as of 02/10/2017.     Allergies as of 02/10/2017 - Review Complete 02/10/2017  Allergen Reaction Noted  . Oxycodone  03/05/2012  . Sulfur Diarrhea 09/29/2011    Past Medical History:  Diagnosis Date  . Allergic rhinitis, cause unspecified   . Arthritis   . Asthma   . Carpal tunnel syndrome   . Cataract   . Diabetes mellitus without complication (HCC)    diet- controlled  . GERD (gastroesophageal reflux disease)   . Glaucoma   .  Hypertension   . Impaired fasting glucose   . Morbid obesity ([place])   . OSA (obstructive sleep apnea) 12/02/2016  . Other malaise and fatigue   . Other specified cardiac dysrhythmias(427.89)   . Pain in joint, site unspecified   . Symptomatic menopausal or female climacteric states     Past Surgical History:  Procedure Laterality Date  . ABDOMINAL HYSTERECTOMY     1994  . carpal tunnel both hands     2012  . CLOSED MANIPULATION SHOULDER Left 04/2014  . EYE SURGERY Left 08/25/2015  . EYE SURGERY Right 09/24/2015  . JOINT REPLACEMENT     both knees replacement, 2010  . mole removed from face    . Nodule removed from back    . SHOULDER SURGERY Left 11/2013  . TONSILLECTOMY     as teenager    Family History  Problem Relation Age of Onset  . Cancer Father   . Diabetes Father   . Diabetes Brother   . Diabetes Paternal Aunt   . Lung cancer Cousin     Social History   Social History  . Marital status: Single    Spouse name: N/A  . Number of children: N/A  . Years of education: N/A   Occupational History  . Not on file.   Social History Main Topics  . Smoking status: Never Smoker  . Smokeless tobacco: Never Used  . Alcohol use No  . Drug use: No  . Sexual activity: No     Comment: post office worker   Other Topics Concern  . Not on file   Social History Narrative  . No narrative on file    Review of systems: Review of Systems  Constitutional: Negative for fever and chills.  HENT: Negative.   Eyes: Negative for blurred vision.  Respiratory: as per HPI  Cardiovascular: Negative for chest pain and palpitations.  Gastrointestinal: Negative for vomiting, diarrhea, blood per rectum. Genitourinary: Negative for dysuria, urgency, frequency and hematuria.  Musculoskeletal: Negative for myalgias, back pain and joint pain.  Skin: Negative for itching and rash.  Neurological: Negative for dizziness, tremors, focal weakness, seizures and loss of consciousness.    Endo/Heme/Allergies: Negative for environmental allergies.  Psychiatric/Behavioral: Negative for depression, suicidal ideas and hallucinations.  All other systems reviewed and are negative.  Physical Exam: Blood pressure 130/78, pulse 69, height 5\' 9"  (1.753 m), weight 289 lb (131.1 kg), SpO2 98 %. Gen:      No acute distress HEENT:  EOMI, sclera anicteric Neck:     No masses; no thyromegaly Lungs:    Clear to auscultation bilaterally; normal respiratory effort CV:         Regular rate and rhythm; no murmurs Abd:      + bowel sounds; soft, non-tender; no palpable masses, no distension Ext:    No edema; adequate peripheral perfusion Skin:      Warm and dry; no rash Neuro: alert and oriented x  3 Psych: normal mood and affect  Data Reviewed: [facility] 11/04/16- 18  CT abdomen pelvis 03/13/08-clear lung images Chest x-ray 10/20/08-no acute cardiopulmonary abnormality Chest x-ray 03/17/09-no acute cardiopulmonary abnormality Chest x-ray 11/04/16-no acute cardiopulmonary abnormality I reviewed all images personally.  Sleep study 11/25/16 Severe obstructive sleep apnea. AHI 40.3, low sats 88%.  Assessment:  Mild persistent asthma Well controlled on Qvar inhaler. She follows with Dr. Donneta Romberg at the [facility] center with plans to wean off the inhaled steroids eventually. She has some dyspnea on exertion which may be from weight gain, deconditioning. X ray from 6/18 reviewed. It shows mild stable scarring with no acute process. We'll try to get any recent pulmonary function tests from Dr. Rush Landmark office. If there are none available we will order PFTs from our office.  Pulmonary clearance for cataract surgery OK to undergo cataract surgery. Scheduled for 02/16/17  Severe sleep apnea Tolerating autoset CPAP therapy. Follows with Dr. Annamaria Boots  Plan/Recommendations: - Get records from Dr. Rush Landmark office - Continue qvar - Continue CPAP  [name] MD [facility] Pager  [phone] 02/10/2017, 2:43 PM  CC: [name], DO   Addendum: Received clinic note from Dr. Donneta Romberg, [facility] dated 02/09/17 Sensitivity noted to dust mite, cockroach She is on immunotherapy for multiple allergies. No PFTs on record. We will go ahead and order full PFTs to be done at our center.  [name] MD [facility] Pager [phone] If no answer or after 3pm call: [phone] 02/27/2017, 4:46 PM

## 2017-02-10 NOTE — Telephone Encounter (Signed)
Faxed medical record request to [facility] and asthma. Received successful confirmation.  Will await records.

## 2017-02-16 DIAGNOSIS — H25811 Combined forms of age-related cataract, right eye: Secondary | ICD-10-CM | POA: Diagnosis not present

## 2017-02-16 DIAGNOSIS — H2511 Age-related nuclear cataract, right eye: Secondary | ICD-10-CM | POA: Diagnosis not present

## 2017-02-17 DIAGNOSIS — J3089 Other allergic rhinitis: Secondary | ICD-10-CM | POA: Diagnosis not present

## 2017-02-17 DIAGNOSIS — J301 Allergic rhinitis due to pollen: Secondary | ICD-10-CM | POA: Diagnosis not present

## 2017-02-20 NOTE — Telephone Encounter (Signed)
Records have been received and placed in PM's cubby for review.  Nothing further needed.  

## 2017-02-23 DIAGNOSIS — J301 Allergic rhinitis due to pollen: Secondary | ICD-10-CM | POA: Diagnosis not present

## 2017-02-23 DIAGNOSIS — J3089 Other allergic rhinitis: Secondary | ICD-10-CM | POA: Diagnosis not present

## 2017-02-27 NOTE — Telephone Encounter (Signed)
Reviewed records. Note placed in chart. Please order full PFTs.  [name] MD [facility] 02/27/2017, 4:48 PM

## 2017-03-02 DIAGNOSIS — H2512 Age-related nuclear cataract, left eye: Secondary | ICD-10-CM | POA: Diagnosis not present

## 2017-03-02 DIAGNOSIS — H25812 Combined forms of age-related cataract, left eye: Secondary | ICD-10-CM | POA: Diagnosis not present

## 2017-03-02 NOTE — Addendum Note (Signed)
Addended by: Maryanna Shape A on: 03/02/2017 11:55 AM   Modules accepted: Orders

## 2017-03-02 NOTE — Telephone Encounter (Signed)
Pt is aware of PM's recommendations and voiced her understanding.  PFT has been scheduled for 03/18/17. Nothing further needed.

## 2017-03-03 DIAGNOSIS — J301 Allergic rhinitis due to pollen: Secondary | ICD-10-CM | POA: Diagnosis not present

## 2017-03-03 DIAGNOSIS — J3089 Other allergic rhinitis: Secondary | ICD-10-CM | POA: Diagnosis not present

## 2017-03-09 DIAGNOSIS — J3089 Other allergic rhinitis: Secondary | ICD-10-CM | POA: Diagnosis not present

## 2017-03-09 DIAGNOSIS — J301 Allergic rhinitis due to pollen: Secondary | ICD-10-CM | POA: Diagnosis not present

## 2017-03-13 ENCOUNTER — Other Ambulatory Visit: Payer: Self-pay | Admitting: Internal Medicine

## 2017-03-17 ENCOUNTER — Other Ambulatory Visit: Payer: Self-pay | Admitting: Internal Medicine

## 2017-03-18 ENCOUNTER — Ambulatory Visit (INDEPENDENT_AMBULATORY_CARE_PROVIDER_SITE_OTHER): Payer: Medicare Other | Admitting: Pulmonary Disease

## 2017-03-18 DIAGNOSIS — J45998 Other asthma: Secondary | ICD-10-CM

## 2017-03-18 DIAGNOSIS — J45909 Unspecified asthma, uncomplicated: Secondary | ICD-10-CM

## 2017-03-18 LAB — PULMONARY FUNCTION TEST
DL/VA % PRED: 103 %
DL/VA: 5.43 ml/min/mmHg/L
DLCO COR: 21.26 ml/min/mmHg
DLCO UNC % PRED: 73 %
DLCO cor % pred: 71 %
DLCO unc: 21.64 ml/min/mmHg
FEF 25-75 PRE: 2.46 L/s
FEF 25-75 Post: 2.35 L/sec
FEF2575-%Change-Post: -4 %
FEF2575-%PRED-PRE: 110 %
FEF2575-%Pred-Post: 105 %
FEV1-%Change-Post: -2 %
FEV1-%PRED-POST: 89 %
FEV1-%PRED-PRE: 91 %
FEV1-POST: 2.13 L
FEV1-Pre: 2.17 L
FEV1FVC-%Change-Post: 3 %
FEV1FVC-%PRED-PRE: 106 %
FEV6-%CHANGE-POST: -5 %
FEV6-%PRED-POST: 83 %
FEV6-%Pred-Pre: 87 %
FEV6-PRE: 2.58 L
FEV6-Post: 2.44 L
FEV6FVC-%PRED-PRE: 103 %
FEV6FVC-%Pred-Post: 103 %
FVC-%Change-Post: -5 %
FVC-%Pred-Post: 80 %
FVC-%Pred-Pre: 85 %
FVC-Post: 2.44 L
FVC-Pre: 2.58 L
Post FEV1/FVC ratio: 87 %
Post FEV6/FVC ratio: 100 %
Pre FEV1/FVC ratio: 84 %
Pre FEV6/FVC Ratio: 100 %
RV % pred: 176 %
RV: 3.98 L
TLC % pred: 120 %
TLC: 6.79 L

## 2017-03-18 NOTE — Patient Instructions (Signed)
PFT done today. 

## 2017-03-20 ENCOUNTER — Other Ambulatory Visit: Payer: Self-pay | Admitting: Internal Medicine

## 2017-03-23 DIAGNOSIS — J301 Allergic rhinitis due to pollen: Secondary | ICD-10-CM | POA: Diagnosis not present

## 2017-03-23 DIAGNOSIS — J3089 Other allergic rhinitis: Secondary | ICD-10-CM | POA: Diagnosis not present

## 2017-03-26 ENCOUNTER — Telehealth: Payer: Self-pay | Admitting: Pulmonary Disease

## 2017-03-26 NOTE — Telephone Encounter (Signed)
Notes recorded by [name], MD on 03/26/2017 at 9:06 AM EDT Please let the patient know that the PFTs show minimal changes that is consistent with asthma. Continue current therapy.  Advised pt of results. Pt understood and nothing further is needed.

## 2017-03-30 DIAGNOSIS — J301 Allergic rhinitis due to pollen: Secondary | ICD-10-CM | POA: Diagnosis not present

## 2017-03-30 DIAGNOSIS — J3089 Other allergic rhinitis: Secondary | ICD-10-CM | POA: Diagnosis not present

## 2017-04-02 ENCOUNTER — Encounter: Payer: Self-pay | Admitting: Internal Medicine

## 2017-04-06 ENCOUNTER — Ambulatory Visit (INDEPENDENT_AMBULATORY_CARE_PROVIDER_SITE_OTHER): Payer: Medicare Other | Admitting: Internal Medicine

## 2017-04-06 ENCOUNTER — Encounter: Payer: Self-pay | Admitting: Internal Medicine

## 2017-04-06 DIAGNOSIS — G4733 Obstructive sleep apnea (adult) (pediatric): Secondary | ICD-10-CM

## 2017-04-06 DIAGNOSIS — J301 Allergic rhinitis due to pollen: Secondary | ICD-10-CM | POA: Diagnosis not present

## 2017-04-06 DIAGNOSIS — J3089 Other allergic rhinitis: Secondary | ICD-10-CM | POA: Diagnosis not present

## 2017-04-06 NOTE — Progress Notes (Signed)
Cheryl [name]    [id number]    1956/02/28  [facility], Brayton Layman, DO  HPI female never smoker followed for OSA, complicated by asthma( Dr Vaughan Browner, DM 2, HBP, GERD, allergic rhinitis/allergy vaccine (Dr Donneta Romberg) NPSG- 11/25/16- AHI 40.3/hour, desaturation to 88%, body weight 286 pounds   ----------------------------------------------------------------  12/31/16-61 year old female never smoker for sleep evaluation. Referred courtesy of Sunday Spillers, NP-sleep study done and to be set up with CPAP through [facility] on Monday 01-05-17.  Medical problems include morbid obesity, DM 2, HBP, GERD, allergic rhinitis/allergy vaccine (Dr Donneta Romberg), asthma/ Dr Vaughan Browner CPAP auto 5-15 ordered by Dr Vaughan Browner, to start next week with [facility] Follow[facility] with Dr. Kimber Relic for pulmonary- mild persistent asthma on Qvar, dyspnea on exertion from obesity and deconditioning. NPSG- 11/25/16- AHI 40.3/hour, desaturation to 88%, body weight 286 pounds Lives alone but visiting family had told her she snored loudly and stops breathing. She admits daytime sleepiness. She also admits gaining at least 20 pounds in the last year-less exercise and more eating. Son has CPAP. ENT surgery-tonsils. Denies heart disease. Pending cataract surgery. Not aware of movement disturbance or parasomnias.  04/06/17-61 year old female never smoker followed for OSA, complicated by asthma( Dr Vaughan Browner), DM 2, HBP, GERD, allergic rhinitis/allergy vaccine (Dr Donneta Romberg) CPAP auto 5-15/ [facility] --OSA; DME : [facility]. Pt wears CPAP just about every night and DL attached. No new supplies needed at this time.  Off to a good start with CPAP.  Noticing a little mask leak which she will discuss with DME company.  Sleeping better.  Download 70% compliance, AHI 5.6/hour Says no recent asthma flare.  ROS-see HPI   + = positive Constitutional:    weight loss, night sweats, fevers, chills, fatigue, lassitude. HEENT:    headaches, difficulty swallowing,  tooth/dental problems, sore throat,       sneezing, itching, ear ache, nasal congestion, post nasal drip, snoring CV:    chest pain, orthopnea, PND, swelling in lower extremities, anasarca,                                           dizziness, palpitations Resp:   shortness of breath with exertion or at rest.                productive cough,   non-productive cough, coughing up of blood.              change in color of mucus.  wheezing.   Skin:    rash or lesions. GI:  No-   heartburn, indigestion, abdominal pain, nausea, vomiting, diarrhea,                 change in bowel habits, loss of appetite GU: dysuria, change in color of urine, no urgency or frequency.   flank pain. MS:   joint pain, stiffness, decreased range of motion, back pain. Neuro-     nothing unusual Psych:  change in mood or affect.  depression or anxiety.   memory loss.  OBJ- Physical Exam General- Alert, Oriented, Affect-appropriate, Distress- none acute, + morbidly obese Skin- rash-none, lesions- none, excoriation- none Lymphadenopathy- none Head- atraumatic            Eyes- Gross vision intact, PERRLA, conjunctivae and secretions clear            Ears- Hearing, canals-normal  Nose- Clear, no-Septal dev, mucus, polyps, erosion, perforation             Throat- Mallampati IV , mucosa clear , drainage- none, tonsils- atrophic, + own teeth Neck- flexible , trachea midline, no stridor , thyroid nl, carotid no bruit Chest - symmetrical excursion , unlabored           Heart/CV- RRR , no murmur , no gallop  , no rub, nl s1 s2                           - JVD- none , edema- none, stasis changes- none, varices- none           Lung- clear to P&A, wheeze- none, cough- none , dullness-none, rub- none           Chest wall-  Abd-  Br/ Gen/ Rectal- Not done, not indicated Extrem- cyanosis- none, clubbing, none, atrophy- none, strength- nl Neuro- grossly intact to observation

## 2017-04-06 NOTE — Patient Instructions (Signed)
We can continue CPAP auto 5-15, mask of choice, humidifier, supplies, AirView              As discussed- talk with [facility] about your mask leaks so they can give you a better fit if needed.  Please call us if we can help

## 2017-04-07 NOTE — Assessment & Plan Note (Signed)
We reviewed compliance goals and comfort measures.  Discussed importance of mask fit so she will get help from her DME company on this.  She recognizes she is better off with CPAP and I think her motivation is good. Plan-continue CPAP auto 5-15.  Work on compliance.  Work on mask fit.

## 2017-04-13 DIAGNOSIS — J3089 Other allergic rhinitis: Secondary | ICD-10-CM | POA: Diagnosis not present

## 2017-04-13 DIAGNOSIS — J301 Allergic rhinitis due to pollen: Secondary | ICD-10-CM | POA: Diagnosis not present

## 2017-04-20 DIAGNOSIS — J3089 Other allergic rhinitis: Secondary | ICD-10-CM | POA: Diagnosis not present

## 2017-04-20 DIAGNOSIS — J301 Allergic rhinitis due to pollen: Secondary | ICD-10-CM | POA: Diagnosis not present

## 2017-04-21 LAB — HM DIABETES EYE EXAM

## 2017-04-27 DIAGNOSIS — J301 Allergic rhinitis due to pollen: Secondary | ICD-10-CM | POA: Diagnosis not present

## 2017-04-27 DIAGNOSIS — J3089 Other allergic rhinitis: Secondary | ICD-10-CM | POA: Diagnosis not present

## 2017-05-07 DIAGNOSIS — J301 Allergic rhinitis due to pollen: Secondary | ICD-10-CM | POA: Diagnosis not present

## 2017-05-07 DIAGNOSIS — J3089 Other allergic rhinitis: Secondary | ICD-10-CM | POA: Diagnosis not present

## 2017-05-11 ENCOUNTER — Other Ambulatory Visit: Payer: Medicare Other

## 2017-05-11 DIAGNOSIS — J301 Allergic rhinitis due to pollen: Secondary | ICD-10-CM | POA: Diagnosis not present

## 2017-05-11 DIAGNOSIS — E119 Type 2 diabetes mellitus without complications: Secondary | ICD-10-CM | POA: Diagnosis not present

## 2017-05-11 DIAGNOSIS — J3089 Other allergic rhinitis: Secondary | ICD-10-CM | POA: Diagnosis not present

## 2017-05-11 DIAGNOSIS — E785 Hyperlipidemia, unspecified: Secondary | ICD-10-CM

## 2017-05-12 LAB — BASIC METABOLIC PANEL WITH GFR
BUN: 11 mg/dL (ref 7–25)
CO2: 31 mmol/L (ref 20–32)
CREATININE: 0.77 mg/dL (ref 0.50–0.99)
Calcium: 9.9 mg/dL (ref 8.6–10.4)
Chloride: 103 mmol/L (ref 98–110)
GFR, EST NON AFRICAN AMERICAN: 82 mL/min/{1.73_m2} (ref >=60)
GFR, Est African American: 95 mL/min/{1.73_m2} (ref >=60)
GLUCOSE: 101 mg/dL — AB (ref 65–99)
POTASSIUM: 3.9 mmol/L (ref 3.5–5.3)
SODIUM: 142 mmol/L (ref 135–146)

## 2017-05-12 LAB — LIPID PANEL
CHOL/HDL RATIO: 2.8 (calc) (ref <5.0)
Cholesterol: 156 mg/dL (ref <200)
HDL: 55 mg/dL (ref >50)
LDL CHOLESTEROL (CALC): 83 mg/dL
NON-HDL CHOLESTEROL (CALC): 101 mg/dL (ref <130)
Triglycerides: 90 mg/dL (ref <150)

## 2017-05-12 LAB — HEMOGLOBIN A1C
EAG (MMOL/L): 7.3 (calc)
Hgb A1c MFr Bld: 6.2 % of total Hgb — ABNORMAL HIGH (ref <5.7)
Mean Plasma Glucose: 131 (calc)

## 2017-05-12 LAB — MICROALBUMIN / CREATININE URINE RATIO
CREATININE, URINE: 222 mg/dL (ref 20–275)
Microalb Creat Ratio: 11 mcg/mg creat (ref <30)
Microalb, Ur: 2.4 mg/dL

## 2017-05-12 LAB — ALT: ALT: 11 U/L (ref 6–29)

## 2017-05-13 ENCOUNTER — Encounter: Payer: Self-pay | Admitting: Internal Medicine

## 2017-05-13 ENCOUNTER — Ambulatory Visit (INDEPENDENT_AMBULATORY_CARE_PROVIDER_SITE_OTHER): Payer: Medicare Other | Admitting: Internal Medicine

## 2017-05-13 DIAGNOSIS — M159 Polyosteoarthritis, unspecified: Secondary | ICD-10-CM | POA: Diagnosis not present

## 2017-05-13 DIAGNOSIS — E785 Hyperlipidemia, unspecified: Secondary | ICD-10-CM | POA: Diagnosis not present

## 2017-05-13 DIAGNOSIS — E119 Type 2 diabetes mellitus without complications: Secondary | ICD-10-CM

## 2017-05-13 DIAGNOSIS — I10 Essential (primary) hypertension: Secondary | ICD-10-CM | POA: Diagnosis not present

## 2017-05-13 NOTE — Progress Notes (Signed)
Patient ID: Cheryl [name], female   DOB: 06-15-1955, 61 y.o.   MRN: [id number]   Location:  [facility] OFFICE  Provider: DR Arletha Grippe  Goals of Care:  Advanced Directives 11/25/2016  Does Patient Have a Medical Advance Directive? No  Type of Advance Directive -  Does patient want to make changes to medical advance directive? -  Copy of [place] in Chart? -  Would patient like information on creating a medical advance directive? No - Patient declined     Chief Complaint  Patient presents with  . Medical Management of Chronic Issues    6 Months Routine Visit- Arthritis, HTN, DM, Patient states she has sleep apnea     HPI: Patient is a 61 y.o. female seen today for medical management of chronic diseases. She is s/p OU cats with IOL in Sept and Oct 2018. No immediate complications. She states she has trouble seeing at night (sees a glare). Lab results reviewed with pt.  Hyperlipidemia - stable on simvastatin. LDL 83  HTN - stable on maxzide. Takes ASA daily  DM - BS <100 most days. She checks CBGs 3 times per week. No low BS reactions. Occasional numbness/tingling in ands/feet. Metformin stopped by Endo Dr Dwyane Dee. A1c 6.2%. Endo told to f/u prn. Last eye exam in Nov 2018 by [facility] - no diabetic eye disease. urine microalbumin/Cr ratio 11. She is on statin  GERD - stable on omeprazole  allergic rhinitis/asthma - she reports issues with seasonal allergy improved. She is taking shots, using nasal flonase and veramyst, Qvar inhaler. Allegra changed to xyzal. She does not use nasal saline spray. Also using ginger and lemon tea. She feels nauseated from post nasal drip. She is followed by pulm Dr Vaughan Browner - PFTs completed and reveals mild asthma. Sleep study completed in July 2018 revealed OSA and she is now on CPAP 5-15 auto qhs.  Arthritis - pain stable on prn tramadol. Followed by Dr Percell Miller (ortho). She had left knee pain and saw Dr Percell Miller who treated left  hip bursitis with injection which helped knee.  Glaucoma - OU. She had laser procedure in March and April. Followed by Dr Arlina Robes at [facility] on [address]. Uses eye gtts, alaway prn. She is s/p OU cats with IOL on Sept and Oct 2018. She now has a glare at night  Obesity - she never started 1200 calorie diet. Weight up 11 lbs. BMI 45.66. She has been unmotivated to watch diet nor exercise.   Past Medical History:  Diagnosis Date  . Allergic rhinitis, cause unspecified   . Arthritis   . Asthma   . Carpal tunnel syndrome   . Cataract   . Diabetes mellitus without complication (HCC)    diet- controlled  . GERD (gastroesophageal reflux disease)   . Glaucoma   . Hypertension   . Impaired fasting glucose   . Morbid obesity ([place])   . OSA (obstructive sleep apnea) 12/02/2016  . Other malaise and fatigue   . Other specified cardiac dysrhythmias(427.89)   . Pain in joint, site unspecified   . Symptomatic menopausal or female climacteric states     Past Surgical History:  Procedure Laterality Date  . ABDOMINAL HYSTERECTOMY     1994  . carpal tunnel both hands     2012  . CLOSED MANIPULATION SHOULDER Left 04/2014  . EYE SURGERY Left 08/25/2015  . EYE SURGERY Right 09/24/2015  . JOINT REPLACEMENT  both knees replacement, 2010  . mole removed from face    . Nodule removed from back    . SHOULDER SURGERY Left 11/2013  . TONSILLECTOMY     as teenager     reports that  has never smoked. she has never used smokeless tobacco. She reports that she does not drink alcohol or use drugs. Social History   Socioeconomic History  . Marital status: Single    Spouse name: Not on file  . Number of children: Not on file  . Years of education: Not on file  . Highest education level: Not on file  Social Needs  . Financial resource strain: Not on file  . Food insecurity - worry: Not on file  . Food insecurity - inability: Not on file  . Transportation needs - medical: Not on file  .  Transportation needs - non-medical: Not on file  Occupational History  . Not on file  Tobacco Use  . Smoking status: Never Smoker  . Smokeless tobacco: Never Used  Substance and Sexual Activity  . Alcohol use: No  . Drug use: No  . Sexual activity: No    Comment: post office worker  Other Topics Concern  . Not on file  Social History Narrative  . Not on file    Family History  Problem Relation Age of Onset  . Cancer Father   . Diabetes Father   . Diabetes Brother   . Diabetes Paternal Aunt   . Lung cancer Cousin     Allergies  Allergen Reactions  . Oxycodone     Had stomach and headache as side effect from medicine  . Sulfur Diarrhea    Outpatient Encounter Medications as of 05/13/2017  Medication Sig  . ACCU-CHEK AVIVA PLUS test strip CHECK BLOOD SUGAR ONCE DAILY AS INSTRUCTED DX E11.9  . acetaminophen (TYLENOL) 500 MG tablet Take 500 mg by mouth daily.  . AMBULATORY NON FORMULARY MEDICATION Medication Name: Allergy Injection- once weekly  . Ascorbic Acid (VITAMIN C) 100 MG tablet Take 1,000 mg by mouth daily.  Marland Kitchen aspirin 81 MG tablet Take 81 mg by mouth daily.  . B-D ULTRA-FINE 33 LANCETS MISC 1 each by Does not apply route as directed.  . beclomethasone (QVAR) 80 MCG/ACT inhaler Inhale 1 puff into the lungs as needed.  . Calcium Carbonate-Vitamin D (CALTRATE 600+D) 600-400 MG-UNIT per chew tablet Chew 1 tablet by mouth daily. Chew one tablet once a day  . EPIPEN 2-PAK 0.3 MG/0.3ML SOAJ injection   . fluticasone (FLONASE) 50 MCG/ACT nasal spray One spray each nostril once daily for allergies  . fluticasone (VERAMYST) 27.5 MCG/SPRAY nasal spray Place 2 sprays into the nose 2 (two) times daily.  Marland Kitchen levocetirizine (XYZAL) 5 MG tablet Take 5 mg by mouth every evening.  . Multiple Vitamin (MULTIVITAMIN) tablet Take 1 tablet by mouth daily. Take one tablet once a day  . omeprazole (PRILOSEC) 10 MG capsule TAKE ONE CAPSULE BY MOUTH ONCE DAILY FOR STOMACH  . simvastatin  (ZOCOR) 20 MG tablet TAKE ONE TABLET BY MOUTH ONCE DAILY AT BEDTIME FOR CHOLESTEROL  . triamterene-hydrochlorothiazide (MAXZIDE) 75-50 MG tablet TAKE ONE TABLET BY MOUTH ONCE DAILY FOR BLOOD PRESSURE  . vitamin E (VITAMIN E) 400 UNIT capsule Take 400 Units by mouth daily.  . [DISCONTINUED] Ketotifen Fumarate (ALAWAY OP) Place into both eyes.   . [DISCONTINUED] PROLENSA 0.07 % SOLN INSTILL 1 DROP IN THE RIGHT EYE ONCE A DAY, STARTING THE DAY OF SURGERY, AFTER THE PROCEDURE  No facility-administered encounter medications on file as of 05/13/2017.     Review of Systems:  Review of Systems  Constitutional: Positive for activity change.  Cardiovascular: Positive for leg swelling.  Musculoskeletal: Positive for arthralgias, back pain and gait problem.  All other systems reviewed and are negative.   Health Maintenance  Topic Date Due  . Hepatitis C Screening  1955/10/09  . HIV Screening  10/12/1968  . MAMMOGRAM  09/24/2016  . OPHTHALMOLOGY EXAM  10/09/2016  . PAP SMEAR  10/12/2016  . HEMOGLOBIN A1C  11/09/2017  . FOOT EXAM  11/19/2017  . PNEUMOCOCCAL POLYSACCHARIDE VACCINE (2) 05/03/2018  . URINE MICROALBUMIN  05/11/2018  . COLONOSCOPY  03/11/2020  . TETANUS/TDAP  08/03/2021  . INFLUENZA VACCINE  Completed    Physical Exam: Vitals:   05/13/17 1523  BP: 138/70  Pulse: 73  Temp: 97.9 F (36.6 C)  TempSrc: Oral  SpO2: 96%  Weight: (!) 300 lb 3.2 oz (136.2 kg)  Height: 5' 8"  (1.727 m)   Body mass index is 45.65 kg/m. Physical Exam  Labs reviewed: Basic Metabolic Panel: Recent Labs    11/17/16 1007 05/11/17 1015  NA 142 142  K 3.9 3.9  CL 103 103  CO2 28 31  GLUCOSE 101* 101*  BUN 8 11  CREATININE 0.85 0.77  CALCIUM 9.8 9.9  TSH 3.07  --    Liver Function Tests: Recent Labs    11/17/16 1007 05/11/17 1015  AST 17  --   ALT 12 11  ALKPHOS 54  --   BILITOT 0.5  --   PROT 6.9  --   ALBUMIN 4.5  --    No results for input(s): LIPASE, AMYLASE in the last  8760 hours. No results for input(s): AMMONIA in the last 8760 hours. CBC: Recent Labs    11/17/16 1007  WBC 7.2  NEUTROABS 4,680  HGB 13.1  HCT 39.6  MCV 81.6  PLT 378   Lipid Panel: Recent Labs    11/17/16 1007 05/11/17 1015  CHOL 162 156  HDL 54 55  LDLCALC 88  --   TRIG 99 90  CHOLHDL 3.0 2.8   Lab Results  Component Value Date   HGBA1C 6.2 (H) 05/11/2017    Procedures since last visit: No results found.  Assessment/Plan   ICD-10-CM   1. Morbid obesity ([place]) E66.01 CMP with eGFR  2. Type 2 diabetes mellitus without complication, without long-term current use of insulin (HCC) E11.9 CMP with eGFR    CBC with Differential/Platelets    Hemoglobin A1c  3. Hyperlipidemia LDL goal <100 E78.5 Lipid Panel    TSH  4. Essential hypertension, benign I10 CBC with Differential/Platelets  5. Generalized osteoarthrosis, involving multiple sites M15.9      Discussed reducing calories to 1200 daily  Increase exercise as tolerated  Continue current medications as ordered  Follow up with specialists as scheduled  Follow up in 6 mos for AWV/EV. Fasting labs prior to appt  Lawn S. Perlie Gold  [facility] [address] [place], [place] [address] [phone] Cell (Monday-Friday 8 AM - 5 PM) [phone] After 5 PM and follow prompts

## 2017-05-13 NOTE — Patient Instructions (Signed)
Continue current medications as ordered  Follow up with specialists as scheduled  Follow up in 6 mos for AWV/EV. Fasting labs prior to appt

## 2017-05-21 DIAGNOSIS — J301 Allergic rhinitis due to pollen: Secondary | ICD-10-CM | POA: Diagnosis not present

## 2017-05-21 DIAGNOSIS — J3089 Other allergic rhinitis: Secondary | ICD-10-CM | POA: Diagnosis not present

## 2017-05-25 DIAGNOSIS — J301 Allergic rhinitis due to pollen: Secondary | ICD-10-CM | POA: Diagnosis not present

## 2017-05-25 DIAGNOSIS — J3089 Other allergic rhinitis: Secondary | ICD-10-CM | POA: Diagnosis not present

## 2017-06-01 DIAGNOSIS — J3089 Other allergic rhinitis: Secondary | ICD-10-CM | POA: Diagnosis not present

## 2017-06-01 DIAGNOSIS — J301 Allergic rhinitis due to pollen: Secondary | ICD-10-CM | POA: Diagnosis not present

## 2017-06-07 ENCOUNTER — Other Ambulatory Visit: Payer: Self-pay | Admitting: Internal Medicine

## 2017-06-09 DIAGNOSIS — J301 Allergic rhinitis due to pollen: Secondary | ICD-10-CM | POA: Diagnosis not present

## 2017-06-09 DIAGNOSIS — J3089 Other allergic rhinitis: Secondary | ICD-10-CM | POA: Diagnosis not present

## 2017-06-12 DIAGNOSIS — J301 Allergic rhinitis due to pollen: Secondary | ICD-10-CM | POA: Diagnosis not present

## 2017-06-12 DIAGNOSIS — J3089 Other allergic rhinitis: Secondary | ICD-10-CM | POA: Diagnosis not present

## 2017-06-15 DIAGNOSIS — J3089 Other allergic rhinitis: Secondary | ICD-10-CM | POA: Diagnosis not present

## 2017-06-15 DIAGNOSIS — J301 Allergic rhinitis due to pollen: Secondary | ICD-10-CM | POA: Diagnosis not present

## 2017-06-22 DIAGNOSIS — J3081 Allergic rhinitis due to animal (cat) (dog) hair and dander: Secondary | ICD-10-CM | POA: Diagnosis not present

## 2017-06-22 DIAGNOSIS — J301 Allergic rhinitis due to pollen: Secondary | ICD-10-CM | POA: Diagnosis not present

## 2017-06-22 DIAGNOSIS — J3089 Other allergic rhinitis: Secondary | ICD-10-CM | POA: Diagnosis not present

## 2017-06-24 DIAGNOSIS — M7062 Trochanteric bursitis, left hip: Secondary | ICD-10-CM | POA: Diagnosis not present

## 2017-06-29 DIAGNOSIS — J3089 Other allergic rhinitis: Secondary | ICD-10-CM | POA: Diagnosis not present

## 2017-06-29 DIAGNOSIS — J301 Allergic rhinitis due to pollen: Secondary | ICD-10-CM | POA: Diagnosis not present

## 2017-07-06 DIAGNOSIS — J301 Allergic rhinitis due to pollen: Secondary | ICD-10-CM | POA: Diagnosis not present

## 2017-07-06 DIAGNOSIS — J3089 Other allergic rhinitis: Secondary | ICD-10-CM | POA: Diagnosis not present

## 2017-07-13 DIAGNOSIS — J3089 Other allergic rhinitis: Secondary | ICD-10-CM | POA: Diagnosis not present

## 2017-07-13 DIAGNOSIS — J301 Allergic rhinitis due to pollen: Secondary | ICD-10-CM | POA: Diagnosis not present

## 2017-07-20 DIAGNOSIS — J301 Allergic rhinitis due to pollen: Secondary | ICD-10-CM | POA: Diagnosis not present

## 2017-07-20 DIAGNOSIS — J3089 Other allergic rhinitis: Secondary | ICD-10-CM | POA: Diagnosis not present

## 2017-07-27 DIAGNOSIS — J3089 Other allergic rhinitis: Secondary | ICD-10-CM | POA: Diagnosis not present

## 2017-07-27 DIAGNOSIS — J301 Allergic rhinitis due to pollen: Secondary | ICD-10-CM | POA: Diagnosis not present

## 2017-08-03 DIAGNOSIS — J3089 Other allergic rhinitis: Secondary | ICD-10-CM | POA: Diagnosis not present

## 2017-08-03 DIAGNOSIS — J301 Allergic rhinitis due to pollen: Secondary | ICD-10-CM | POA: Diagnosis not present

## 2017-08-10 DIAGNOSIS — J301 Allergic rhinitis due to pollen: Secondary | ICD-10-CM | POA: Diagnosis not present

## 2017-08-10 DIAGNOSIS — J3089 Other allergic rhinitis: Secondary | ICD-10-CM | POA: Diagnosis not present

## 2017-08-17 ENCOUNTER — Ambulatory Visit (INDEPENDENT_AMBULATORY_CARE_PROVIDER_SITE_OTHER): Payer: Medicare Other | Admitting: Pulmonary Disease

## 2017-08-17 ENCOUNTER — Encounter: Payer: Self-pay | Admitting: Pulmonary Disease

## 2017-08-17 ENCOUNTER — Ambulatory Visit (INDEPENDENT_AMBULATORY_CARE_PROVIDER_SITE_OTHER)
Admission: RE | Admit: 2017-08-17 | Discharge: 2017-08-17 | Disposition: A | Discharge status: Home / Self Care | Payer: Medicare Other | Source: Ambulatory Visit | Attending: Pulmonary Disease | Admitting: Pulmonary Disease

## 2017-08-17 VITALS — BP 118/82 | HR 78 | Ht 68.0 in | Wt 305.0 lb

## 2017-08-17 DIAGNOSIS — G4733 Obstructive sleep apnea (adult) (pediatric): Secondary | ICD-10-CM | POA: Diagnosis not present

## 2017-08-17 DIAGNOSIS — R0602 Shortness of breath: Secondary | ICD-10-CM | POA: Diagnosis not present

## 2017-08-17 DIAGNOSIS — J301 Allergic rhinitis due to pollen: Secondary | ICD-10-CM | POA: Diagnosis not present

## 2017-08-17 DIAGNOSIS — J45998 Other asthma: Secondary | ICD-10-CM | POA: Diagnosis not present

## 2017-08-17 DIAGNOSIS — J45909 Unspecified asthma, uncomplicated: Secondary | ICD-10-CM

## 2017-08-17 DIAGNOSIS — J3089 Other allergic rhinitis: Secondary | ICD-10-CM | POA: Diagnosis not present

## 2017-08-17 NOTE — Patient Instructions (Signed)
Continue using inhalers We will get a chest x-ray today to reevaluate the mild scarring in the lung Follow-up in 6 months.

## 2017-08-17 NOTE — Progress Notes (Signed)
[facility]    [id number]    27-Jan-1956  Primary Care Physician:Carter, Brayton Layman, DO  Referring Physician: [name], [place], [place] [address]  Chief complaint:   Follow up for asthma OSA  HPI: 62 year old with history of asthma, diabetes, hypertension, GERD, morbid obesity. She has a diagnosis of asthma for 5-6 years. She has been maintained for Qvar for the past couple of years with good control of symptoms. She [facility] with [facility] for treatment of mold, mildew, dust, pollen allergies. She gets allergy shots every week. She was told by Dr. Donneta Romberg that her asthma is under good control and he is considering taking her of the inhaled steroids.  She reports increased dyspnea when she traveled to [place] over the winter which she attributes to the cold temperature. She still does not feel back to baseline and has dyspnea on exertion. She does not have significant symptoms at rest, no cough, wheezing, sputum production. She has been told that she has witnessed apneas and has symptoms of snoring.  Pets: None Occupation: Retired Tour manager.  Exposures:Has been exposed to dust in her office. Smoking history: Never smoker  Flu vaccine 2018 Up to date with Pneumococcus vaccine at PCP.   Interim History: Received clinic note from Dr. Donneta Romberg, [facility] dated 02/09/17 Sensitivity noted to dust mite, cockroach She is on immunotherapy for multiple allergies.  Breathing is doing well on Qvar.  She has had mild worsening last week when she went out and was exposed to pollen.  Outpatient Encounter Medications as of 08/17/2017  Medication Sig  . ACCU-CHEK AVIVA PLUS test strip CHECK BLOOD SUGAR ONCE DAILY AS INSTRUCTED DX E11.9  . acetaminophen (TYLENOL) 500 MG tablet Take 500 mg by mouth daily.  . AMBULATORY NON FORMULARY MEDICATION Medication Name: Allergy Injection- once weekly  . Ascorbic Acid (VITAMIN C) 100 MG  tablet Take 1,000 mg by mouth daily.  Marland Kitchen aspirin 81 MG tablet Take 81 mg by mouth daily.  . B-D ULTRA-FINE 33 LANCETS MISC 1 each by Does not apply route as directed.  . beclomethasone (QVAR) 80 MCG/ACT inhaler Inhale 1 puff into the lungs as needed.  . Calcium Carbonate-Vitamin D (CALTRATE 600+D) 600-400 MG-UNIT per chew tablet Chew 1 tablet by mouth daily. Chew one tablet once a day  . EPIPEN 2-PAK 0.3 MG/0.3ML SOAJ injection   . fluticasone (FLONASE) 50 MCG/ACT nasal spray One spray each nostril once daily for allergies  . fluticasone (VERAMYST) 27.5 MCG/SPRAY nasal spray Place 2 sprays into the nose 2 (two) times daily.  Marland Kitchen levocetirizine (XYZAL) 5 MG tablet Take 5 mg by mouth every evening.  . Multiple Vitamin (MULTIVITAMIN) tablet Take 1 tablet by mouth daily. Take one tablet once a day  . omeprazole (PRILOSEC) 10 MG capsule TAKE ONE CAPSULE BY MOUTH ONCE DAILY FOR STOMACH  . simvastatin (ZOCOR) 20 MG tablet TAKE ONE TABLET BY MOUTH ONCE DAILY AT BEDTIME FOR CHOLESTEROL  . triamterene-hydrochlorothiazide (MAXZIDE) 75-50 MG tablet TAKE ONE TABLET BY MOUTH ONCE DAILY FOR BLOOD PRESSURE  . vitamin E (VITAMIN E) 400 UNIT capsule Take 400 Units by mouth daily.   No facility-administered encounter medications on file as of 08/17/2017.     Allergies as of 08/17/2017 - Review Complete 08/17/2017  Allergen Reaction Noted  . Oxycodone  03/05/2012  . Sulfur Diarrhea 09/29/2011    Past Medical History:  Diagnosis Date  . Allergic rhinitis,  cause unspecified   . Arthritis   . Asthma   . Carpal tunnel syndrome   . Cataract   . Diabetes mellitus without complication (HCC)    diet- controlled  . GERD (gastroesophageal reflux disease)   . Glaucoma   . Hypertension   . Impaired fasting glucose   . Morbid obesity ([place])   . OSA (obstructive sleep apnea) 12/02/2016  . Other malaise and fatigue   . Other specified cardiac dysrhythmias(427.89)   . Pain in joint, site unspecified   . Symptomatic  menopausal or female climacteric states     Past Surgical History:  Procedure Laterality Date  . ABDOMINAL HYSTERECTOMY     1994  . carpal tunnel both hands     2012  . CLOSED MANIPULATION SHOULDER Left 04/2014  . EYE SURGERY Left 08/25/2015  . EYE SURGERY Right 09/24/2015  . JOINT REPLACEMENT     both knees replacement, 2010  . mole removed from face    . Nodule removed from back    . SHOULDER SURGERY Left 11/2013  . TONSILLECTOMY     as teenager    Family History  Problem Relation Age of Onset  . Cancer Father   . Diabetes Father   . Diabetes Brother   . Diabetes Paternal Aunt   . Lung cancer Cousin     Social History   Socioeconomic History  . Marital status: Single    Spouse name: Not on file  . Number of children: Not on file  . Years of education: Not on file  . Highest education level: Not on file  Occupational History  . Not on file  Social Needs  . Financial resource strain: Not on file  . Food insecurity:    Worry: Not on file    Inability: Not on file  . Transportation needs:    Medical: Not on file    Non-medical: Not on file  Tobacco Use  . Smoking status: Never Smoker  . Smokeless tobacco: Never Used  Substance and Sexual Activity  . Alcohol use: No  . Drug use: No  . 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 Physical activity:    Days per week: Not on file    Minutes per session: Not on file  . Stress: Not on file  Relationships  . Social connections:    Talks on phone: Not on file    Gets together: Not on file    Attends religious service: Not on file    Active member of club or organization: Not on file    Attends meetings of clubs or organizations: Not on file    Relationship status: Not on file  . Intimate partner violence:    Fear of current or ex partner: Not on file    Emotionally abused: Not on file    Physically abused: Not on file    Forced sexual activity: Not on file  Other Topics Concern    . Not on file  Social History Narrative  . Not on file    Review of systems: Review of Systems  Constitutional: Negative for fever and chills.  HENT: Negative.   Eyes: Negative for blurred vision.  Respiratory: as per HPI  Cardiovascular: Negative for chest pain and palpitations.  Gastrointestinal: Negative for vomiting, diarrhea, blood per rectum. Genitourinary: Negative for dysuria, urgency, frequency and hematuria.  Musculoskeletal: Negative for myalgias, back pain and joint pain.  Skin: Negative for itching and  rash.  Neurological: Negative for dizziness, tremors, focal weakness, seizures and loss of consciousness.  Endo/Heme/Allergies: Negative for environmental allergies.  Psychiatric/Behavioral: Negative for depression, suicidal ideas and hallucinations.  All other systems reviewed and are negative.  Physical Exam: Blood pressure 118/82, pulse 78, height 5\' 8"  (1.727 m), weight (!) 305 lb (138.3 kg), SpO2 99 %. Gen:      No acute distress HEENT:  EOMI, sclera anicteric Neck:     No masses; no thyromegaly Lungs:    Clear to auscultation bilaterally; normal respiratory effort CV:         Regular rate and rhythm; no murmurs Abd:      + bowel sounds; soft, non-tender; no palpable masses, no distension Ext:    No edema; adequate peripheral perfusion Skin:      Warm and dry; no rash Neuro: alert and oriented x 3 Psych: normal mood and affect  Data Reviewed: FENO 11/04/16- 18  CT abdomen pelvis 03/13/08-clear lung images Chest x-ray 10/20/08-no acute cardiopulmonary abnormality Chest x-ray 03/17/09-no acute cardiopulmonary abnormality Chest x-ray 11/04/16-no acute cardiopulmonary abnormality I reviewed all images personally.  Sleep study 11/25/16 Severe obstructive sleep apnea. AHI 40.3, low sats 88%.  PFTs 03/18/17 FVC 2.44 [80%], FEV1 2.13 [9%], F/F 87, TLC 120%, DLCO 73% Mild hyperinflation with air trapping, minimal diffusion defect.  Assessment:  Mild persistent  asthma Well controlled on Qvar inhaler. She follows with Dr[facility]. Initial plans were to wean her off the inhaled steroid but this is on hold as she had a minor setback due to pollen exposure   Abnormal chest x-ray X ray from 6/18 reviewed. It shows mild stable scarring with no acute process. Repeat chest x-ray today to reevaluate.  Severe sleep apnea Tolerating autoset CPAP therapy. Follows with Dr. Annamaria Boots  Plan/Recommendations: - Chest x-ray - Continue Qvar, CPAP  [name] MD [facility] Pager [phone] 08/17/2017, 3:24 PM  CC: [name], DO

## 2017-08-24 DIAGNOSIS — J301 Allergic rhinitis due to pollen: Secondary | ICD-10-CM | POA: Diagnosis not present

## 2017-08-24 DIAGNOSIS — J3089 Other allergic rhinitis: Secondary | ICD-10-CM | POA: Diagnosis not present

## 2017-08-31 DIAGNOSIS — J301 Allergic rhinitis due to pollen: Secondary | ICD-10-CM | POA: Diagnosis not present

## 2017-08-31 DIAGNOSIS — J3089 Other allergic rhinitis: Secondary | ICD-10-CM | POA: Diagnosis not present

## 2017-09-28 DIAGNOSIS — J3089 Other allergic rhinitis: Secondary | ICD-10-CM | POA: Diagnosis not present

## 2017-09-28 DIAGNOSIS — J301 Allergic rhinitis due to pollen: Secondary | ICD-10-CM | POA: Diagnosis not present

## 2017-09-30 DIAGNOSIS — H1045 Other chronic allergic conjunctivitis: Secondary | ICD-10-CM | POA: Diagnosis not present

## 2017-09-30 DIAGNOSIS — J3089 Other allergic rhinitis: Secondary | ICD-10-CM | POA: Diagnosis not present

## 2017-09-30 DIAGNOSIS — J4530 Mild persistent asthma, uncomplicated: Secondary | ICD-10-CM | POA: Diagnosis not present

## 2017-09-30 DIAGNOSIS — J301 Allergic rhinitis due to pollen: Secondary | ICD-10-CM | POA: Diagnosis not present

## 2017-10-04 ENCOUNTER — Encounter: Payer: Self-pay | Admitting: Internal Medicine

## 2017-10-05 ENCOUNTER — Encounter: Payer: Self-pay | Admitting: Internal Medicine

## 2017-10-05 ENCOUNTER — Ambulatory Visit (INDEPENDENT_AMBULATORY_CARE_PROVIDER_SITE_OTHER): Payer: Medicare Other | Admitting: Internal Medicine

## 2017-10-05 DIAGNOSIS — J3089 Other allergic rhinitis: Secondary | ICD-10-CM | POA: Diagnosis not present

## 2017-10-05 DIAGNOSIS — G4733 Obstructive sleep apnea (adult) (pediatric): Secondary | ICD-10-CM

## 2017-10-05 DIAGNOSIS — J301 Allergic rhinitis due to pollen: Secondary | ICD-10-CM | POA: Diagnosis not present

## 2017-10-05 NOTE — Patient Instructions (Signed)
Order- [facility]- please replace supplies- hoses, masks, filters, etc. Continue auto 5-15, mask of choice, humidifier, AirView   Please call if I can help

## 2017-10-05 NOTE — Progress Notes (Signed)
Cheryl [name]    [id number]    04-13-1956  [facility], Brayton Layman, DO  HPI female never smoker followed for OSA, complicated by asthma( Dr Vaughan Browner), DM 2, HBP, GERD, allergic rhinitis/allergy vaccine (Dr Donneta Romberg), glaucoma NPSG- 11/25/16- AHI 40.3/hour, desaturation to 88%, body weight 286 pounds  ---------------------------------------------------------------- 04/06/17-62 year old female never smoker followed for OSA, complicated by asthma( Dr Vaughan Browner), DM 2, HBP, GERD, allergic rhinitis/allergy vaccine (Dr Donneta Romberg) CPAP auto 5-15/ [facility] --OSA; DME : [facility]. Pt wears CPAP just about every night and DL attached. No new supplies needed at this time.  Off to a good start with CPAP.  Noticing a little mask leak which she will discuss with DME company.  Sleeping better.  Download 70% compliance, AHI 5.6/hour Says no recent asthma flare.  10/05/2017- 62 year old female never smoker followed for OSA, complicated by asthma( Dr Vaughan Browner), DM 2, HBP, GERD, allergic rhinitis/allergy vaccine (Dr Donneta Romberg), glaucoma CPAP auto 5-15/ [facility] ----OSA; DME [facility]. Pt wears CPAP nightly; DL attached. pressure works well and will need order for new supplies.  Feels she is doing well with CPAP.  Download compliance 97% AHI 5.0/hour. Reports an asthma medication from her allergist made her queasy-unsure which one.  ROS-see HPI   + = positive Constitutional:    weight loss, night sweats, fevers, chills, fatigue, lassitude. HEENT:    headaches, difficulty swallowing, tooth/dental problems, sore throat,       sneezing, itching, ear ache, nasal congestion, post nasal drip, snoring CV:    chest pain, orthopnea, PND, swelling in lower extremities, anasarca,                                           dizziness, palpitations Resp:   shortness of breath with exertion or at rest.                productive cough,   non-productive cough, coughing up of blood.              change in color of mucus.   wheezing.   Skin:    rash or lesions. GI:  No-   heartburn, indigestion, abdominal pain, nausea, vomiting, diarrhea,                 change in bowel habits, loss of appetite GU: dysuria, change in color of urine, no urgency or frequency.   flank pain. MS:   joint pain, stiffness, decreased range of motion, back pain. Neuro-     nothing unusual Psych:  change in mood or affect.  depression or anxiety.   memory loss.  OBJ- Physical Exam General- Alert, Oriented, Affect-appropriate, Distress- none acute, + morbidly obese Skin- rash-none, lesions- none, excoriation- none Lymphadenopathy- none Head- atraumatic            Eyes- Gross vision intact, PERRLA, conjunctivae and secretions clear            Ears- Hearing, canals-normal            Nose- Clear, no-Septal dev, mucus, polyps, erosion, perforation             Throat- Mallampati IV , mucosa clear , drainage- none, tonsils- atrophic, + own teeth Neck- flexible , trachea midline, no stridor , thyroid nl, carotid no bruit Chest - symmetrical excursion , unlabored  Heart/CV- RRR , no murmur , no gallop  , no rub, nl s1 s2                           - JVD- none , edema- none, stasis changes- none, varices- none           Lung- clear to P&A, wheeze- none, cough- none , dullness-none, rub- none           Chest wall-  Abd-  Br/ Gen/ Rectal- Not done, not indicated Extrem- cyanosis- none, clubbing, none, atrophy- none, strength- nl Neuro- grossly intact to observation

## 2017-10-12 DIAGNOSIS — J3089 Other allergic rhinitis: Secondary | ICD-10-CM | POA: Diagnosis not present

## 2017-10-12 DIAGNOSIS — J301 Allergic rhinitis due to pollen: Secondary | ICD-10-CM | POA: Diagnosis not present

## 2017-10-14 DIAGNOSIS — J3089 Other allergic rhinitis: Secondary | ICD-10-CM | POA: Diagnosis not present

## 2017-10-14 DIAGNOSIS — J301 Allergic rhinitis due to pollen: Secondary | ICD-10-CM | POA: Diagnosis not present

## 2017-10-15 ENCOUNTER — Telehealth: Payer: Self-pay | Admitting: Internal Medicine

## 2017-10-15 DIAGNOSIS — G4733 Obstructive sleep apnea (adult) (pediatric): Secondary | ICD-10-CM

## 2017-10-15 NOTE — Telephone Encounter (Signed)
Order for CPAP supplies not ordered.  Order placed 10/15/17. Called and spoke with Patient.  I explained that order was placed and someone from [facility] should be contacting her within the next week.   Nothing further needed at this time.  Per CY AVS- Order- [facility]- please replace supplies- hoses, masks, filters, etc. Continue auto 5-15, mask of choice, humidifier, AirView

## 2017-10-20 DIAGNOSIS — J301 Allergic rhinitis due to pollen: Secondary | ICD-10-CM | POA: Diagnosis not present

## 2017-10-20 DIAGNOSIS — J3089 Other allergic rhinitis: Secondary | ICD-10-CM | POA: Diagnosis not present

## 2017-10-26 ENCOUNTER — Other Ambulatory Visit: Payer: Self-pay | Admitting: Internal Medicine

## 2017-10-26 DIAGNOSIS — J3089 Other allergic rhinitis: Secondary | ICD-10-CM | POA: Diagnosis not present

## 2017-10-26 DIAGNOSIS — J301 Allergic rhinitis due to pollen: Secondary | ICD-10-CM | POA: Diagnosis not present

## 2017-10-28 DIAGNOSIS — M47816 Spondylosis without myelopathy or radiculopathy, lumbar region: Secondary | ICD-10-CM | POA: Diagnosis not present

## 2017-10-28 DIAGNOSIS — M7062 Trochanteric bursitis, left hip: Secondary | ICD-10-CM | POA: Diagnosis not present

## 2017-11-02 DIAGNOSIS — J301 Allergic rhinitis due to pollen: Secondary | ICD-10-CM | POA: Diagnosis not present

## 2017-11-02 DIAGNOSIS — J3089 Other allergic rhinitis: Secondary | ICD-10-CM | POA: Diagnosis not present

## 2017-11-09 DIAGNOSIS — J3089 Other allergic rhinitis: Secondary | ICD-10-CM | POA: Diagnosis not present

## 2017-11-09 DIAGNOSIS — J301 Allergic rhinitis due to pollen: Secondary | ICD-10-CM | POA: Diagnosis not present

## 2017-11-11 ENCOUNTER — Telehealth: Payer: Self-pay | Admitting: Internal Medicine

## 2017-11-11 NOTE — Telephone Encounter (Signed)
Called [facility] and spoke with [place]. They did receive the supply order and noted that they tried reaching out to pt but was unsuccessful. He informed me to have pt call back and if they are not avaialble then to leave a message and they would return the call.   I called pt to let herknow of Corene Cornea instructions and she understood. Nothing further is needed.

## 2017-11-12 ENCOUNTER — Telehealth: Payer: Self-pay | Admitting: Internal Medicine

## 2017-11-12 NOTE — Telephone Encounter (Signed)
Left msg asking pt to confirm AWV-S w/ nurse along w/ labs. Last AWV 11/10/16. VDM (DD)

## 2017-11-13 ENCOUNTER — Ambulatory Visit (INDEPENDENT_AMBULATORY_CARE_PROVIDER_SITE_OTHER): Payer: Medicare Other

## 2017-11-13 ENCOUNTER — Other Ambulatory Visit: Payer: Medicare Other

## 2017-11-13 VITALS — BP 140/82 | HR 64 | Temp 98.1°F | Ht 68.0 in | Wt 299.0 lb

## 2017-11-13 DIAGNOSIS — Z0000 Encounter for general adult medical examination without abnormal findings: Secondary | ICD-10-CM | POA: Diagnosis not present

## 2017-11-13 DIAGNOSIS — E785 Hyperlipidemia, unspecified: Secondary | ICD-10-CM | POA: Diagnosis not present

## 2017-11-13 DIAGNOSIS — I10 Essential (primary) hypertension: Secondary | ICD-10-CM | POA: Diagnosis not present

## 2017-11-13 DIAGNOSIS — E119 Type 2 diabetes mellitus without complications: Secondary | ICD-10-CM | POA: Diagnosis not present

## 2017-11-13 NOTE — Progress Notes (Signed)
Subjective:   Cheryl [name] is a 62 y.o. female who presents for Medicare Annual (Subsequent) preventive examination.  Last AWV-11/10/2016    Objective:     Vitals: BP 140/82 (BP Location: Left Arm, Patient Position: Sitting)   Pulse 64   Temp 98.1 F (36.7 C) (Oral)   Ht 5\' 8"  (1.727 m)   Wt 299 lb (135.6 kg)   SpO2 98%   BMI 45.46 kg/m   Body mass index is 45.46 kg/m.  Advanced Directives 11/13/2017 11/25/2016 11/19/2016 11/10/2016 10/07/2016 10/26/2015 06/27/2015  Does Patient Have a Medical Advance Directive? Yes No No No No Yes Yes  Type of Paramedic of [place];Living will - - - - Living will Living will  Does patient want to make changes to medical advance directive? No - Patient declined - - - - No - Patient declined No - Patient declined  Copy of [place] in Chart? No - copy requested - - - - Yes Yes  Would patient like information on creating a medical advance directive? 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 Allergic rhinitis, cause unspecified   . Arthritis   . Asthma   . Carpal tunnel syndrome   . Cataract   . Diabetes mellitus without complication (HCC)    diet- controlled  . GERD (gastroesophageal reflux disease)   . Glaucoma     . Hypertension   . Impaired fasting glucose   . Morbid obesity ([place])   . OSA (obstructive sleep apnea) 12/02/2016  . Other malaise and fatigue   . Other specified cardiac dysrhythmias(427.89)   . Pain in joint, site unspecified   . Symptomatic menopausal or female climacteric states    Past Surgical History:  Procedure Laterality Date  . ABDOMINAL HYSTERECTOMY     1994  . carpal tunnel both hands     2012  . CLOSED MANIPULATION SHOULDER Left 04/2014  . EYE SURGERY Left 08/25/2015  . EYE SURGERY Right 09/24/2015  . JOINT REPLACEMENT     both knees replacement, 2010  . mole removed from face    . Nodule removed from back    . SHOULDER SURGERY Left 11/2013  . TONSILLECTOMY     as teenager   Family History  Problem Relation Age of Onset  . Cancer Father   . Diabetes Father   . Diabetes Brother   . Diabetes Paternal Aunt   . Lung cancer Cousin    Social History   Socioeconomic History  . Marital status: Single    Spouse name: Not on file  . Number of children: Not on file  . Years of education: Not on file  . Highest education level: Not on file  Occupational History  . Not on file  Social  Needs  . Financial resource strain: Not hard at all  . Food insecurity:    Worry: Never true    Inability: Never true  . Transportation needs:    Medical: No    Non-medical: No  Tobacco Use  . Smoking status: Never Smoker  . Smokeless tobacco: Never Used  Substance and Sexual Activity  . Alcohol use: No  . Drug use: No  . 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 Physical activity:    Days per week: 0 days    Minutes per session: 0 min  . Stress: To some extent  Relationships  . Social connections:    Talks on phone: More than three times a week    Gets together: Never    Attends religious service: Never    Active member of club or organization: No    Attends meetings of clubs or organizations: Never    Relationship status: Never married   Other Topics Concern  . Not on file  Social History Narrative  . Not on file    Outpatient Encounter Medications as of 11/13/2017  Medication Sig  . ACCU-CHEK AVIVA PLUS test strip CHECK BLOOD SUGAR ONCE DAILY AS INSTRUCTED DX E11.9  . acetaminophen (TYLENOL) 500 MG tablet Take 500 mg by mouth daily.  Marland Kitchen albuterol (PROVENTIL HFA;VENTOLIN HFA) 108 (90 Base) MCG/ACT inhaler Inhale 2 puffs into the lungs every 4 (four) hours as needed.  . AMBULATORY NON FORMULARY MEDICATION Medication Name: Allergy Injection- once weekly  . Ascorbic Acid (VITAMIN C) 100 MG tablet Take 1,000 mg by mouth daily.  Marland Kitchen aspirin 81 MG tablet Take 81 mg by mouth daily.  Marland Kitchen azelastine (ASTELIN) 0.1 % nasal spray 1-2 SPRAYS IN EACH NOSTRIL TWICE A DAY NASALLY 30 DAYS  . B-D ULTRA-FINE 33 LANCETS MISC 1 each by Does not apply route as directed.  . Calcium Carbonate-Vitamin D (CALTRATE 600+D) 600-400 MG-UNIT per chew tablet Chew 1 tablet by mouth daily. Chew one tablet once a day  . EPIPEN 2-PAK 0.3 MG/0.3ML SOAJ injection   . FLOVENT HFA 110 MCG/ACT inhaler Inhale 2 puffs into the lungs 2 (two) times daily.  . fluticasone (FLONASE) 50 MCG/ACT nasal spray One spray each nostril once daily for allergies  . levocetirizine (XYZAL) 5 MG tablet Take 5 mg by mouth every evening.  . Multiple Vitamin (MULTIVITAMIN) tablet Take 1 tablet by mouth daily. Take one tablet once a day  . omeprazole (PRILOSEC) 10 MG capsule TAKE ONE CAPSULE BY MOUTH ONCE DAILY FOR STOMACH  . simvastatin (ZOCOR) 20 MG tablet TAKE ONE TABLET BY MOUTH ONCE DAILY AT BEDTIME FOR CHOLESTEROL  . triamterene-hydrochlorothiazide (MAXZIDE) 75-50 MG tablet TAKE ONE TABLET BY MOUTH ONCE DAILY FOR BLOOD PRESSURE  . vitamin E (VITAMIN E) 400 UNIT capsule Take 400 Units by mouth daily.  . fluticasone (VERAMYST) 27.5 MCG/SPRAY nasal spray Place 2 sprays into the nose 2 (two) times daily.  . montelukast (SINGULAIR) 10 MG tablet Take 10 mg by mouth daily.   No  facility-administered encounter medications on file as of 11/13/2017.     Activities of Daily Living In your present state of health, do you have any difficulty performing the following activities: 11/13/2017  Hearing? N  Vision? N  Difficulty concentrating or making decisions? N  Walking or climbing stairs? Y  Dressing or bathing? N  Doing errands, shopping? N  Preparing Food and eating ? N  Using the Toilet? N  In the  past six months, have you accidently leaked urine? N  Do you have problems with loss of bowel control? N  Managing your Medications? N  Managing your Finances? N  Housekeeping or managing your Housekeeping? N  Some recent data might be hidden    Patient Care Team: [name], DO as PCP - General (Internal Medicine) [name], [place] (Optometry)    Assessment:   This is a routine wellness examination for Cheryl [name].  Exercise Activities and Dietary recommendations Current Exercise Habits: The patient does not participate in regular exercise at present, Exercise limited by: orthopedic condition(s)  Goals    . Weight (lb) < 240 lb (108.9 kg)     Patient will eat smaller meals throughout the day and have portion control       Fall Risk Fall Risk  11/13/2017 05/13/2017 11/19/2016 11/10/2016 10/07/2016  Falls in the past year? No No No No No  Number falls in past yr: - - - - -  Injury with Fall? - - - - -   Is the patient's home free of loose throw rugs in walkways, pet beds, electrical cords, etc?   yes      Grab bars in the bathroom? no      Handrails on the stairs?   yes      Adequate lighting?   yes  Depression Screen PHQ 2/9 Scores 11/13/2017 05/13/2017 11/19/2016 11/10/2016  PHQ - 2 Score 0 0 0 0  PHQ- 9 Score - - - -     Cognitive Function within normal limits        Immunization History  Administered Date(s) Administered  . Influenza Split 02/07/2017  . Influenza,inj,Quad PF,6+ Mos 02/08/2013, 02/23/2015, 02/29/2016  . Influenza-Unspecified  01/24/2014  . Pneumococcal Polysaccharide-23 05/03/2013  . Td 08/04/2011  . Zoster 05/26/2014, 02/23/2017  . Zoster Recombinat (Shingrix) 06/30/2017    Qualifies for Shingles Vaccine? Up to date  Screening Tests Health Maintenance  Topic Date Due  . Hepatitis C Screening  1956-05-26  . HIV Screening  10/[birth year]/1968  . OPHTHALMOLOGY EXAM  10/09/2016  . HEMOGLOBIN A1C  11/09/2017  . FOOT EXAM  11/19/2017  . INFLUENZA VACCINE  12/24/2017  . URINE MICROALBUMIN  05/11/2018  . MAMMOGRAM  11/25/2018  . COLONOSCOPY  03/11/2020  . TETANUS/TDAP  08/03/2021  . PAP SMEAR  Discontinued    Cancer Screenings: Lung: Low Dose CT Chest recommended if Age 87-80 years, 30 pack-year currently smoking OR have quit w/in 15years. Patient does not qualify. Breast:  Up to date on Mammogram? Yes   Up to date of Bone Density/Dexa? Yes Colorectal: up to date  Additional Screenings:  Hepatitis C Screening: declined Diabetic eye exam up to date-records release forms signed     Plan:    I have personally reviewed and addressed the Medicare Annual Wellness questionnaire and have noted the following in the patient's chart:  A. Medical and social history B. Use of alcohol, tobacco or illicit drugs  C. Current medications and supplements D. Functional ability and status E.  Nutritional status F.  Physical activity G. Advance directives H. List of other physicians I.  Hospitalizations, surgeries, and ER visits in previous 12 months J.  [place] to include hearing, vision, cognitive, depression L. Referrals and appointments - none  In addition, I have reviewed and discussed with patient certain preventive protocols, quality metrics, and best practice recommendations. A written personalized care plan for preventive services as well as general preventive health recommendations were provided  to patient.  See attached scanned questionnaire for additional information.   Signed,   Tyson Dense,  RN Nurse Health Advisor  Patient Concerns: Nausea, increased gas, bloated. Muscle pains in legs and L hip. Swelling in BLE

## 2017-11-13 NOTE — Patient Instructions (Signed)
[name] , Thank you for taking time to come for your Medicare Wellness Visit. I appreciate your ongoing commitment to your health goals. Please review the following plan we discussed and let me know if I can assist you in the future.   Screening recommendations/referrals: Colonoscopy up to date, due 05/17/2022 Mammogram up to date, due 05/14/2019 Bone Density up to date, due 05/14/2019 Recommended yearly ophthalmology/optometry visit for glaucoma screening and checkup Recommended yearly dental visit for hygiene and checkup  Vaccinations: Influenza vaccine up to date, due 2019 fall season Pneumococcal vaccine due age 37 Tdap vaccine up to date, due 08/03/2021 Shingles vaccine up to date. Please call us with your first date of the shot    Advanced directives: Please bring [place] a copy of your living will and health care power of attorney  Conditions/risks identified: none  Next appointment: Dr. Eulas Post 11/17/2017 @ 2:45 pm  Preventive Care 40-64 Years, Female Preventive care refers to lifestyle choices and visits with your health care provider that can promote health and wellness. What does preventive care include?  A yearly physical exam. This is also called an annual well check.  Dental exams once or twice a year.  Routine eye exams. Ask your health care provider how often you should have your eyes checked.  Personal lifestyle choices, including:  Daily care of your teeth and gums.  Regular physical activity.  Eating a healthy diet.  Avoiding tobacco and drug use.  Limiting alcohol use.  Practicing safe sex.  Taking low-dose aspirin daily starting at age 44.  Taking vitamin and mineral supplements as recommended by your health care provider. What happens during an annual well check? The services and screenings done by your health care provider during your annual well check will depend on your age, overall health, lifestyle risk factors, and family history of  disease. Counseling  Your health care provider may ask you questions about your:  Alcohol use.  Tobacco use.  Drug use.  Emotional well-being.  Home and relationship well-being.  Sexual activity.  Eating habits.  Work and work Statistician.  Method of birth control.  Menstrual cycle.  Pregnancy history. Screening  You may have the following tests or measurements:  Height, weight, and BMI.  Blood pressure.  Lipid and cholesterol levels. These may be checked every 5 years, or more frequently if you are over 59 years old.  Skin check.  Lung cancer screening. You may have this screening every year starting at age 48 if you have a 30-pack-year history of smoking and currently smoke or have quit within the past 15 years.  Fecal occult blood test (FOBT) of the stool. You may have this test every year starting at age 38.  Flexible sigmoidoscopy or colonoscopy. You may have a sigmoidoscopy every 5 years or a colonoscopy every 10 years starting at age 38.  Hepatitis C blood test.  Hepatitis B blood test.  Sexually transmitted disease (STD) testing.  Diabetes screening. This is done by checking your blood sugar (glucose) after you have not eaten for a while (fasting). You may have this done every 1-3 years.  Mammogram. This may be done every 1-2 years. Talk to your health care provider about when you should start having regular mammograms. This may depend on whether you have a family history of breast cancer.  BRCA-related cancer screening. This may be done if you have a family history of breast, ovarian, tubal, or peritoneal cancers.  Pelvic exam and Pap test. This may be  done every 3 years starting at age 83. Starting at age 9, this may be done every 5 years if you have a Pap test in combination with an HPV test.  Bone density scan. This is done to screen for osteoporosis. You may have this scan if you are at high risk for osteoporosis. Discuss your test results,  treatment options, and if necessary, the need for more tests with your health care provider. Vaccines  Your health care provider may recommend certain vaccines, such as:  Influenza vaccine. This is recommended every year.  Tetanus, diphtheria, and acellular pertussis (Tdap, Td) vaccine. You may need a Td booster every 10 years.  Zoster vaccine. You may need this after age 68.  Pneumococcal 13-valent conjugate (PCV13) vaccine. You may need this if you have certain conditions and were not previously vaccinated.  Pneumococcal polysaccharide (PPSV23) vaccine. You may need one or two doses if you smoke cigarettes or if you have certain conditions. Talk to your health care provider about which screenings and vaccines you need and how often you need them. This information is not intended to replace advice given to you by your health care provider. Make sure you discuss any questions you have with your health care provider. Document Released: 06/08/2015 Document Revised: 01/30/2016 Document Reviewed: 03/13/2015 Elsevier Interactive Patient Education  2017 [place] Prevention in the Home Falls can cause injuries. They can happen to people of all ages. There are many things you can do to make your home safe and to help prevent falls. What can I do on the outside of my home?  Regularly fix the edges of walkways and driveways and fix any cracks.  Remove anything that might make you trip as you walk through a door, such as a raised step or threshold.  Trim any bushes or trees on the path to your home.  Use bright outdoor lighting.  Clear any walking paths of anything that might make someone trip, such as rocks or tools.  Regularly check to see if handrails are loose or broken. Make sure that both sides of any steps have handrails.  Any raised decks and porches should have guardrails on the edges.  Have any leaves, snow, or ice cleared regularly.  Use sand or salt on walking  paths during winter.  Clean up any spills in your garage right away. This includes oil or grease spills. What can I do in the bathroom?  Use night lights.  Install grab bars by the toilet and in the tub and shower. Do not use towel bars as grab bars.  Use non-skid mats or decals in the tub or shower.  If you need to sit down in the shower, use a plastic, non-slip stool.  Keep the floor dry. Clean up any water that spills on the floor as soon as it happens.  Remove soap buildup in the tub or shower regularly.  Attach bath mats securely with double-sided non-slip rug tape.  Do not have throw rugs and other things on the floor that can make you trip. What can I do in the bedroom?  Use night lights.  Make sure that you have a light by your bed that is easy to reach.  Do not use any sheets or blankets that are too big for your bed. They should not hang down onto the floor.  Have a firm chair that has side arms. You can use this for support while you get dressed.  Do not  have throw rugs and other things on the floor that can make you trip. What can I do in the kitchen?  Clean up any spills right away.  Avoid walking on wet floors.  Keep items that you use a lot in easy-to-reach places.  If you need to reach something above you, use a strong step stool that has a grab bar.  Keep electrical cords out of the way.  Do not use floor polish or wax that makes floors slippery. If you must use wax, use non-skid floor wax.  Do not have throw rugs and other things on the floor that can make you trip. What can I do with my stairs?  Do not leave any items on the stairs.  Make sure that there are handrails on both sides of the stairs and use them. Fix handrails that are broken or loose. Make sure that handrails are as long as the stairways.  Check any carpeting to make sure that it is firmly attached to the stairs. Fix any carpet that is loose or worn.  Avoid having throw rugs at  the top or bottom of the stairs. If you do have throw rugs, attach them to the floor with carpet tape.  Make sure that you have a light switch at the top of the stairs and the bottom of the stairs. If you do not have them, ask someone to add them for you. What else can I do to help prevent falls?  Wear shoes that:  Do not have high heels.  Have rubber bottoms.  Are comfortable and fit you well.  Are closed at the toe. Do not wear sandals.  If you use a stepladder:  Make sure that it is fully opened. Do not climb a closed stepladder.  Make sure that both sides of the stepladder are locked into place.  Ask someone to hold it for you, if possible.  Clearly mark and make sure that you can see:  Any grab bars or handrails.  First and last steps.  Where the edge of each step is.  Use tools that help you move around (mobility aids) if they are needed. These include:  Canes.  Walkers.  Scooters.  Crutches.  Turn on the lights when you go into a dark area. Replace any light bulbs as soon as they burn out.  Set up your furniture so you have a clear path. Avoid moving your furniture around.  If any of your floors are uneven, fix them.  If there are any pets around you, be aware of where they are.  Review your medicines with your doctor. Some medicines can make you feel dizzy. This can increase your chance of falling. Ask your doctor what other things that you can do to help prevent falls. This information is not intended to replace advice given to you by your health care provider. Make sure you discuss any questions you have with your health care provider. Document Released: 03/08/2009 Document Revised: 10/18/2015 Document Reviewed: 06/16/2014 Elsevier Interactive Patient Education  2017 [employer].

## 2017-11-14 LAB — COMPLETE METABOLIC PANEL WITH GFR
AG Ratio: 1.8 (calc) (ref 1.0–2.5)
ALKALINE PHOSPHATASE (APISO): 66 U/L (ref 33–130)
ALT: 11 U/L (ref 6–29)
AST: 14 U/L (ref 10–35)
Albumin: 4.6 g/dL (ref 3.6–5.1)
BUN: 19 mg/dL (ref 7–25)
CALCIUM: 9.7 mg/dL (ref 8.6–10.4)
CO2: 32 mmol/L (ref 20–32)
Chloride: 102 mmol/L (ref 98–110)
Creat: 0.83 mg/dL (ref 0.50–0.99)
GFR, EST NON AFRICAN AMERICAN: 75 mL/min/{1.73_m2} (ref >=60)
GFR, Est African American: 86 mL/min/{1.73_m2} (ref >=60)
GLUCOSE: 90 mg/dL (ref 65–99)
Globulin: 2.5 g/dL (calc) (ref 1.9–3.7)
Potassium: 3.9 mmol/L (ref 3.5–5.3)
SODIUM: 142 mmol/L (ref 135–146)
Total Bilirubin: 0.5 mg/dL (ref 0.2–1.2)
Total Protein: 7.1 g/dL (ref 6.1–8.1)

## 2017-11-14 LAB — CBC WITH DIFFERENTIAL/PLATELET
BASOS ABS: 38 {cells}/uL (ref 0–200)
Basophils Relative: 0.4 %
EOS ABS: 226 {cells}/uL (ref 15–500)
Eosinophils Relative: 2.4 %
HEMATOCRIT: 38.7 % (ref 35.0–45.0)
HEMOGLOBIN: 12.9 g/dL (ref 11.7–15.5)
LYMPHS ABS: 2388 {cells}/uL (ref 850–3900)
MCH: 26.9 pg — AB (ref 27.0–33.0)
MCHC: 33.3 g/dL (ref 32.0–36.0)
MCV: 80.6 fL (ref 80.0–100.0)
MONOS PCT: 8.8 %
MPV: 10.7 fL (ref 7.5–12.5)
Neutro Abs: 5922 cells/uL (ref 1500–7800)
Neutrophils Relative %: 63 %
Platelets: 371 10*3/uL (ref 140–400)
RBC: 4.8 10*6/uL (ref 3.80–5.10)
RDW: 14.6 % (ref 11.0–15.0)
Total Lymphocyte: 25.4 %
WBC: 9.4 10*3/uL (ref 3.8–10.8)
WBCMIX: 827 {cells}/uL (ref 200–950)

## 2017-11-14 LAB — LIPID PANEL
CHOL/HDL RATIO: 3 (calc) (ref <5.0)
Cholesterol: 160 mg/dL (ref <200)
HDL: 53 mg/dL (ref >50)
LDL CHOLESTEROL (CALC): 90 mg/dL
NON-HDL CHOLESTEROL (CALC): 107 mg/dL (ref <130)
Triglycerides: 82 mg/dL (ref <150)

## 2017-11-14 LAB — HEMOGLOBIN A1C
EAG (MMOL/L): 7.3 (calc)
Hgb A1c MFr Bld: 6.2 % of total Hgb — ABNORMAL HIGH (ref <5.7)
Mean Plasma Glucose: 131 (calc)

## 2017-11-14 LAB — TSH: TSH: 3.14 mIU/L (ref 0.40–4.50)

## 2017-11-16 DIAGNOSIS — J3089 Other allergic rhinitis: Secondary | ICD-10-CM | POA: Diagnosis not present

## 2017-11-16 DIAGNOSIS — J301 Allergic rhinitis due to pollen: Secondary | ICD-10-CM | POA: Diagnosis not present

## 2017-11-17 ENCOUNTER — Encounter: Payer: Self-pay | Admitting: Internal Medicine

## 2017-11-17 ENCOUNTER — Ambulatory Visit (INDEPENDENT_AMBULATORY_CARE_PROVIDER_SITE_OTHER): Payer: Medicare Other | Admitting: Internal Medicine

## 2017-11-17 VITALS — BP 126/84 | HR 70 | Temp 98.3°F | Ht 68.0 in | Wt 302.0 lb

## 2017-11-17 DIAGNOSIS — Z0000 Encounter for general adult medical examination without abnormal findings: Secondary | ICD-10-CM

## 2017-11-17 DIAGNOSIS — L659 Nonscarring hair loss, unspecified: Secondary | ICD-10-CM

## 2017-11-17 DIAGNOSIS — E119 Type 2 diabetes mellitus without complications: Secondary | ICD-10-CM | POA: Diagnosis not present

## 2017-11-17 DIAGNOSIS — M5442 Lumbago with sciatica, left side: Secondary | ICD-10-CM

## 2017-11-17 DIAGNOSIS — M159 Polyosteoarthritis, unspecified: Secondary | ICD-10-CM | POA: Diagnosis not present

## 2017-11-17 DIAGNOSIS — E785 Hyperlipidemia, unspecified: Secondary | ICD-10-CM | POA: Diagnosis not present

## 2017-11-17 DIAGNOSIS — K219 Gastro-esophageal reflux disease without esophagitis: Secondary | ICD-10-CM

## 2017-11-17 DIAGNOSIS — I10 Essential (primary) hypertension: Secondary | ICD-10-CM | POA: Diagnosis not present

## 2017-11-17 DIAGNOSIS — G8929 Other chronic pain: Secondary | ICD-10-CM

## 2017-11-17 MED ORDER — TRIAMTERENE-HCTZ 75-50 MG PO TABS: ORAL_TABLET | ORAL | 3 refills | Status: DC

## 2017-11-17 MED ORDER — OMEPRAZOLE 40 MG PO CPDR: 40.0000 mg | DELAYED_RELEASE_CAPSULE | Freq: Every day | ORAL | 1 refills | Status: DC

## 2017-11-17 NOTE — Patient Instructions (Addendum)
We are going to try to lose 5 lbs prior to next visit  INCREASE OMEPRAZOLE 40MG  DAILY  Continue other medications as ordered  Will call with Dermatology referral  Follow up in 4 mos for GERD, HTN, DM, hyperlipidemia. Fasting labs prior to appt  Keeping You Healthy  Get These Tests  Blood Pressure- Have your blood pressure checked by your healthcare provider at least once a year.  Normal blood pressure is 120/80.  Weight- Have your body mass index (BMI) calculated to screen for obesity.  BMI is a measure of body fat based on height and weight.  You can calculate your own BMI at [employer]  Cholesterol- Have your cholesterol checked every year.  Diabetes- Have your blood sugar checked every year if you have high blood pressure, high cholesterol, a family history of diabetes or if you are overweight.  Pap Test - Have a pap test every 1 to 5 years if you have been sexually active.  If you are older than 65 and recent pap tests have been normal you may not need additional pap tests.  In addition, if you have had a hysterectomy  for benign disease additional pap tests are not necessary.  Mammogram-Yearly mammograms are essential for early detection of breast cancer  Screening for Colon Cancer- Colonoscopy starting at age 45. Screening may begin sooner depending on your family history and other health conditions.  Follow up colonoscopy as directed by your Gastroenterologist.  Screening for Osteoporosis- Screening begins at age 12 with bone density scanning, sooner if you are at higher risk for developing Osteoporosis.  Get these medicines  Calcium with Vitamin D- Your body requires 1200-1500 mg of Calcium a day and [phone] IU of Vitamin D a day.  You can only absorb 500 mg of Calcium at a time therefore Calcium must be taken in 2 or 3 separate doses throughout the day.  Hormones- Hormone therapy has been associated with increased risk for certain cancers and heart disease.   Talk to your healthcare provider about if you need relief from menopausal symptoms.  Aspirin- Ask your healthcare provider about taking Aspirin to prevent Heart Disease and Stroke.  Get these Immuniztions  Flu shot- Every fall  Pneumonia shot- Once after the age of 14; if you are younger ask your healthcare provider if you need a pneumonia shot.  Tetanus- Every ten years.  Zostavax- Once after the age of 47 to prevent shingles.  Take these steps  Don't smoke- Your healthcare provider can help you quit. For tips on how to quit, ask your healthcare provider or go to www.smokefree.gov or call 1-800 QUIT-NOW.  Be physically active- Exercise 5 days a week for a minimum of 30 minutes.  If you are not already physically active, start slow and gradually work up to 30 minutes of moderate physical activity.  Try walking, dancing, bike riding, swimming, etc.  Eat a healthy diet- Eat a variety of healthy foods such as fruits, vegetables, whole grains, low fat milk, low fat cheeses, yogurt, lean meats, chicken, fish, eggs, dried beans, tofu, etc.  For more information go to www.thenutritionsource.org  Dental visit- Brush and floss teeth twice daily; visit your dentist twice a year.  Eye exam- Visit your Optometrist or Ophthalmologist yearly.  Drink alcohol in moderation- Limit alcohol intake to one drink or less a day.  Never drink and drive.  Depression- Your emotional health is as important as your physical health.  If you're feeling down or losing interest in things  you normally enjoy, please talk to your healthcare provider.  Seat Belts- can save your life; always wear one  Smoke/Carbon Monoxide detectors- These detectors need to be installed on the appropriate level of your home.  Replace batteries at least once a year.  Violence- If anyone is threatening or hurting you, please tell your healthcare provider.  Living Will/ Health care power of attorney- Discuss with your healthcare  provider and family.

## 2017-11-17 NOTE — Progress Notes (Signed)
Patient ID: Cheryl [name], female   DOB: 1956/04/11, 62 y.o.   MRN: [id number]   Location:  [facility]  Place of Service:  OFFICE  Provider: Arletha Grippe, DO  Patient Care Team: [name], DO as PCP - General (Internal Medicine) [name], [name])  Extended Emergency Contact Information Primary Emergency Contact: Cheeks,Deon Address: [address]          [place] [address] [name] of [place] Phone: [phone] Mobile Phone: [phone] Relation: Son  Code Status:  Goals of Care: Advanced Directive information Advanced Directives 11/13/2017  Does Patient Have a Medical Advance Directive? Yes  Type of Paramedic of [place];Living will  Does patient want to make changes to medical advance directive? No - Patient declined  Copy of [place] in Chart? No - copy requested  Would patient like information on creating a medical advance directive? -     Chief Complaint  Patient presents with  . Medical Management of Chronic Issues    Extended visit, AWV completed 11/13/17, - fall, - depression. Patient c/o lower back pain and left leg pain, no known injury.   . Health Maintenance    Discuss need for HIV screening, pending eye exam on Thursday with Dr.Dickey   . Referral    Dermatology referral for Alopecia- was seeing a doctor in [facility], would like to see someone in [place]     HPI: Patient is a 62 y.o. female seen in today for an annual wellness exam. She has issues with left knee pain and is seeing Ortho who believes it is back related. She would like to see dermatology for alopecia. She was seeing [facility] derm but unable to get their for office appts to get injections in scalp. Her hair is improving. She plans to see eye specialist this week and is c/a glaucoma as her stomach has been upset.    She completed AWV on 11/13/17.   Hyperlipidemia - stable on simvastatin. LDL 83  HTN - stable on maxzide. Takes  ASA daily  DM - BS <100 most days. She checks CBGs 3 times per week. No low BS reactions. Occasional numbness/tingling in ands/feet. Metformin stopped by Endo Dr Dwyane Dee. A1c 6.2%. Endo told to f/u prn. Last eye exam in Nov 2018 by [facility] - no diabetic eye disease. urine microalbumin/Cr ratio 11. She is on statin  GERD - stable on omeprazole  allergic rhinitis/asthma - she reports issues with seasonal allergy improved. She is taking shots, using nasal flonase and veramyst, Qvar inhaler. Allegra changed to xyzal. She does not use nasal saline spray. Also using ginger and lemon tea. She feels nauseated from post nasal drip. She is followed by pulm Dr Vaughan Browner - PFTs completed and reveals mild asthma. Sleep study completed in July 2018 revealed OSA and she is now on CPAP 5-15 auto qhs.  Arthritis - pain stable on prn tramadol. Followed by Dr Percell Miller (ortho). She had left knee pain and saw Dr Percell Miller who treated left hip bursitis with injection which helped knee.  Glaucoma - OU. She had laser procedure in March and April. Followed by Dr Arlina Robes at [facility] [address]. Uses eye gtts, alaway prn. She is s/p OU cats with IOL on Sept and Oct 2018. She now has a glare at night  Obesity - she has fallen off her diet/exercise program due to joint pain. BMI 45.93.  Depression screen [employer] 2/9 11/17/2017  11/13/2017 05/13/2017 11/19/2016 11/10/2016  Decreased Interest 0 0 0 0 0  Down, Depressed, Hopeless 0 0 0 0 0  PHQ - 2 Score 0 0 0 0 0  Altered sleeping - - - - -  Tired, decreased energy - - - - -  Change in appetite - - - - -  Feeling bad or failure about yourself  - - - - -  Trouble concentrating - - - - -  Moving slowly or fidgety/restless - - - - -  Suicidal thoughts - - - - -  PHQ-9 Score - - - - -    Fall Risk  11/17/2017 11/13/2017 05/13/2017 11/19/2016 11/10/2016  Falls in the past year? No No No No No  Number falls in past yr: - - - - -  Injury with Fall? - - - - -   No  flowsheet data found.   Health Maintenance  Topic Date Due  . HIV Screening  10/12/1968  . OPHTHALMOLOGY EXAM  12/22/2017 (Originally 10/09/2016)  . Hepatitis C Screening  11/18/2018 (Originally Jun 07, 1955)  . FOOT EXAM  11/19/2017  . INFLUENZA VACCINE  12/24/2017  . URINE MICROALBUMIN  05/11/2018  . HEMOGLOBIN A1C  05/15/2018  . MAMMOGRAM  11/25/2018  . COLONOSCOPY  03/11/2020  . TETANUS/TDAP  08/03/2021  . PAP SMEAR  Discontinued    Past Medical History:  Diagnosis Date  . Allergic rhinitis, cause unspecified   . Arthritis   . Asthma   . Carpal tunnel syndrome   . Cataract   . Diabetes mellitus without complication (HCC)    diet- controlled  . GERD (gastroesophageal reflux disease)   . Glaucoma   . Hypertension   . Impaired fasting glucose   . Morbid obesity ([place])   . OSA (obstructive sleep apnea) 12/02/2016  . Other malaise and fatigue   . Other specified cardiac dysrhythmias(427.89)   . Pain in joint, site unspecified   . Symptomatic menopausal or female climacteric states     Past Surgical History:  Procedure Laterality Date  . ABDOMINAL HYSTERECTOMY     1994  . carpal tunnel both hands     2012  . CLOSED MANIPULATION SHOULDER Left 04/2014  . EYE SURGERY Left 08/25/2015  . EYE SURGERY Right 09/24/2015  . JOINT REPLACEMENT     both knees replacement, 2010  . mole removed from face    . Nodule removed from back    . SHOULDER SURGERY Left 11/2013  . TONSILLECTOMY     as teenager    Family History  Problem Relation Age of Onset  . Cancer Father   . Diabetes Father   . Diabetes Brother   . Diabetes Paternal Aunt   . Lung cancer Cousin    Family Status  Relation Name Status  . Father Mallie Mussel Deceased at age 54  . Mother Lola Deceased  . Brother Surveyor, mining Deceased  . Son [employer]  . Brother [employer]  . Brother Radiation protection practitioner  . Son Devon Alive  . Ethlyn Daniels  (Not Specified)  . Cousin  Alive    Social History   Socioeconomic History  . Marital  status: Single    Spouse name: Not on file  . Number of children: Not on file  . Years of education: Not on file  . Highest education level: Not on file  Occupational History  . Not on file  Social Needs  . Financial resource strain: Not hard at all  . Food insecurity:  Worry: Never true    Inability: Never true  . Transportation needs:    Medical: No    Non-medical: No  Tobacco Use  . Smoking status: Never Smoker  . Smokeless tobacco: Never Used  Substance and Sexual Activity  . Alcohol use: No  . Drug use: No  . 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 Physical activity:    Days per week: 0 days    Minutes per session: 0 min  . Stress: To some extent  Relationships  . Social connections:    Talks on phone: More than three times a week    Gets together: Never    Attends religious service: Never    Active member of club or organization: No    Attends meetings of clubs or organizations: Never    Relationship status: Never married  . Intimate partner violence:    Fear of current or ex partner: No    Emotionally abused: No    Physically abused: No    Forced sexual activity: No  Other Topics Concern  . Not on file  Social History Narrative  . Not on file    Allergies  Allergen Reactions  . Oxycodone     Had stomach and headache as side effect from medicine  . Sulfur Diarrhea    Allergies as of 11/17/2017      Reactions   Oxycodone    Had stomach and headache as side effect from medicine   Sulfur Diarrhea      Medication List        Accurate as of 11/17/17  3:31 PM. Always use your most recent med list.          ACCU-CHEK AVIVA PLUS test strip Generic drug:  glucose blood CHECK BLOOD SUGAR ONCE DAILY AS INSTRUCTED DX E11.9   acetaminophen 500 MG tablet Commonly known as:  TYLENOL Take 500 mg by mouth daily.   albuterol 108 (90 Base) MCG/ACT inhaler Commonly known as:  PROVENTIL HFA;VENTOLIN HFA Inhale 2 puffs into  the lungs every 4 (four) hours as needed.   AMBULATORY NON FORMULARY MEDICATION Medication Name: Allergy Injection- once weekly   aspirin 81 MG tablet Take 81 mg by mouth daily.   azelastine 0.1 % nasal spray Commonly known as:  ASTELIN 1-2 SPRAYS IN EACH NOSTRIL TWICE A DAY NASALLY 30 DAYS   B-D ULTRA-FINE 33 LANCETS Misc 1 each by Does not apply route as directed.   CALTRATE 600+D 600-400 MG-UNIT chew tablet Generic drug:  Calcium Carbonate-Vitamin D Chew 1 tablet by mouth daily. Chew one tablet once a day   EPIPEN 2-PAK 0.3 mg/0.3 mL Soaj injection Generic drug:  EPINEPHrine   FLOVENT HFA 110 MCG/ACT inhaler Generic drug:  fluticasone Inhale 2 puffs into the lungs 2 (two) times daily.   fluticasone 50 MCG/ACT nasal spray Commonly known as:  FLONASE Place 1-2 sprays into both nostrils daily.   levocetirizine 5 MG tablet Commonly known as:  XYZAL Take 5 mg by mouth every evening.   multivitamin tablet Take 1 tablet by mouth daily. Take one tablet once a day   omeprazole 10 MG capsule Commonly known as:  PRILOSEC TAKE ONE CAPSULE BY MOUTH ONCE DAILY FOR STOMACH   simvastatin 20 MG tablet Commonly known as:  ZOCOR TAKE ONE TABLET BY MOUTH ONCE DAILY AT BEDTIME FOR CHOLESTEROL   triamterene-hydrochlorothiazide 75-50 MG tablet Commonly known as:  MAXZIDE TAKE ONE TABLET BY MOUTH ONCE DAILY FOR  BLOOD PRESSURE   vitamin C 100 MG tablet Take 1,000 mg by mouth daily.   vitamin E 400 UNIT capsule Generic drug:  vitamin E Take 400 Units by mouth daily.        Review of Systems:  Review of Systems  Gastrointestinal: Positive for abdominal distention.  Musculoskeletal: Positive for arthralgias and gait problem.  All other systems reviewed and are negative.   Physical Exam: Vitals:   11/17/17 1458  BP: 126/84  Pulse: 70  Temp: 98.3 F (36.8 C)  TempSrc: Oral  SpO2: 99%  Weight: (!) 302 lb (137 kg)  Height: 5\' 8"  (1.727 m)   Body mass index is 45.92  kg/m. Physical Exam  Constitutional: She is oriented to person, place, and time. She appears well-developed and well-nourished. No distress.  HENT:  Head: Normocephalic and atraumatic.  Right Ear: Hearing, tympanic membrane, external ear and ear canal normal.  Left Ear: Hearing, tympanic membrane, external ear and ear canal normal.  Mouth/Throat: Uvula is midline, oropharynx is clear and moist and mucous membranes are normal. She does not have dentures.  Eyes: Pupils are equal, round, and reactive to light. Conjunctivae, EOM and lids are normal. No scleral icterus.  Neck: Trachea normal and normal range of motion. Neck supple. Carotid bruit is not present. No thyroid mass and no thyromegaly present.  Cardiovascular: Normal rate, regular rhythm and intact distal pulses. Exam reveals no gallop and no friction rub.  Murmur (1/6 SEM) heard. No LE edema b/l. No calf TTP.   Pulmonary/Chest: Effort normal and breath sounds normal. She has no wheezes. She has no rhonchi. She has no rales. She exhibits no mass. Right breast exhibits no inverted nipple, no mass, no nipple discharge, no skin change and no tenderness. Left breast exhibits no inverted nipple, no mass, no nipple discharge, no skin change and no tenderness. Breasts are symmetrical.  Abdominal: Soft. Normal appearance, normal aorta and bowel sounds are normal. She exhibits no pulsatile midline mass and no mass. There is no hepatosplenomegaly. There is no tenderness. There is no rigidity, no rebound and no guarding. No hernia.  Musculoskeletal: She exhibits edema and tenderness. She exhibits no deformity.       Lumbar back: She exhibits decreased range of motion, tenderness and spasm.       Back:  Unable to raise LUE above head without assistance  Lymphadenopathy:       Head (right side): No posterior auricular adenopathy present.       Head (left side): No posterior auricular adenopathy present.    She has no cervical adenopathy.        Right: No supraclavicular adenopathy present.       Left: No supraclavicular adenopathy present.  Neurological: She is alert and oriented to person, place, and time. She has normal strength and normal reflexes. No cranial nerve deficit. Gait normal.  Skin: Skin is warm, dry and intact. No rash noted. Nails show no clubbing.  Psychiatric: She has a normal mood and affect. Her speech is normal and behavior is normal. Thought content normal. Cognition and memory are normal.    Labs reviewed:  Basic Metabolic Panel: Recent Labs    05/11/17 1015 11/13/17 1025  NA 142 142  K 3.9 3.9  CL 103 102  CO2 31 32  GLUCOSE 101* 90  BUN 11 19  CREATININE 0.77 0.83  CALCIUM 9.9 9.7  TSH  --  3.14   Liver Function Tests: Recent Labs    05/11/17 1015  11/13/17 1025  AST  --  14  ALT 11 11  BILITOT  --  0.5  PROT  --  7.1   No results for input(s): LIPASE, AMYLASE in the last 8760 hours. No results for input(s): AMMONIA in the last 8760 hours. CBC: Recent Labs    11/13/17 1025  WBC 9.4  NEUTROABS 5,922  HGB 12.9  HCT 38.7  MCV 80.6  PLT 371   Lipid Panel: Recent Labs    05/11/17 1015 11/13/17 1025  CHOL 156 160  HDL 55 53  LDLCALC 83 90  TRIG 90 82  CHOLHDL 2.8 3.0   Lab Results  Component Value Date   HGBA1C 6.2 (H) 11/13/2017    Procedures: No results found. ECG OBTAINED AND REVIEWED BY MYSELF: NSR @ 66 bpm, nml axis, LAE. No acute ischemic changes. No change since 10/2016  Assessment/Plan   ICD-10-CM   1. Well adult exam Z00.00   2. Essential hypertension, benign I10 EKG 12-Lead  3. Alopecia L65.9 Ambulatory referral to Dermatology  4. Generalized osteoarthrosis, involving multiple sites M15.9   5. Chronic left-sided low back pain with left-sided sciatica M54.42    G89.29   6. Type 2 diabetes mellitus without complication, without long-term current use of insulin (HCC) E11.9   7. Hyperlipidemia LDL goal <100 E78.5   8. Gastroesophageal reflux disease,  esophagitis presence not specified K21.9 omeprazole (PRILOSEC) 40 MG capsule   We are going to try to lose 5 lbs prior to next visit  INCREASE OMEPRAZOLE 40MG  DAILY  Continue other medications as ordered  Will call with Dermatology referral  Follow up in 4 mos for GERD, HTN, DM, hyperlipidemia. Fasting labs prior to appt  Keeping You Healthy handout given    Cordella Register. Perlie Gold  [facility] [address] [place], [place] [address] [phone] Cell (Monday-Friday 8 AM - 5 PM) [phone] After 5 PM and follow prompts

## 2017-11-18 DIAGNOSIS — J301 Allergic rhinitis due to pollen: Secondary | ICD-10-CM | POA: Diagnosis not present

## 2017-11-18 DIAGNOSIS — J3089 Other allergic rhinitis: Secondary | ICD-10-CM | POA: Diagnosis not present

## 2017-11-20 ENCOUNTER — Encounter: Payer: Self-pay | Admitting: Internal Medicine

## 2017-11-23 DIAGNOSIS — J3089 Other allergic rhinitis: Secondary | ICD-10-CM | POA: Diagnosis not present

## 2017-11-23 DIAGNOSIS — J301 Allergic rhinitis due to pollen: Secondary | ICD-10-CM | POA: Diagnosis not present

## 2017-11-24 ENCOUNTER — Other Ambulatory Visit: Payer: Self-pay | Admitting: Sports Medicine

## 2017-11-24 DIAGNOSIS — M5410 Radiculopathy, site unspecified: Secondary | ICD-10-CM

## 2017-11-24 DIAGNOSIS — M25552 Pain in left hip: Secondary | ICD-10-CM | POA: Diagnosis not present

## 2017-11-24 DIAGNOSIS — Z6841 Body Mass Index (BMI) 40.0 and over, adult: Secondary | ICD-10-CM | POA: Diagnosis not present

## 2017-11-24 DIAGNOSIS — M5136 Other intervertebral disc degeneration, lumbar region: Secondary | ICD-10-CM

## 2017-11-24 DIAGNOSIS — M4800 Spinal stenosis, site unspecified: Secondary | ICD-10-CM

## 2017-11-24 DIAGNOSIS — M25551 Pain in right hip: Secondary | ICD-10-CM | POA: Diagnosis not present

## 2017-11-25 DIAGNOSIS — J301 Allergic rhinitis due to pollen: Secondary | ICD-10-CM | POA: Diagnosis not present

## 2017-11-25 DIAGNOSIS — J3089 Other allergic rhinitis: Secondary | ICD-10-CM | POA: Diagnosis not present

## 2017-11-30 ENCOUNTER — Telehealth: Payer: Self-pay

## 2017-11-30 DIAGNOSIS — J301 Allergic rhinitis due to pollen: Secondary | ICD-10-CM | POA: Diagnosis not present

## 2017-11-30 DIAGNOSIS — J3089 Other allergic rhinitis: Secondary | ICD-10-CM | POA: Diagnosis not present

## 2017-11-30 NOTE — Telephone Encounter (Signed)
Ok to take OTC motion sickness med.  She is already taking a fluid pill - triamterene hct. She does not need another one

## 2017-11-30 NOTE — Telephone Encounter (Signed)
Patient called clinical intake with multiple concerns  1.) Patient has a cruise pending for August 2019 and questions if it is ok for her to use OTC motion sickness or patch with her current medications?  2.) Patient also with ongoing swelling that Dr.Carter is aware of (per patient) and questions if she should be on a diuretic?  Please advise

## 2017-12-02 DIAGNOSIS — Z01419 Encounter for gynecological examination (general) (routine) without abnormal findings: Secondary | ICD-10-CM | POA: Diagnosis not present

## 2017-12-02 DIAGNOSIS — Z6841 Body Mass Index (BMI) 40.0 and over, adult: Secondary | ICD-10-CM | POA: Diagnosis not present

## 2017-12-02 DIAGNOSIS — Z1231 Encounter for screening mammogram for malignant neoplasm of breast: Secondary | ICD-10-CM | POA: Diagnosis not present

## 2017-12-02 NOTE — Telephone Encounter (Signed)
Discussed results with patient. Patient verbalized understanding

## 2017-12-07 ENCOUNTER — Ambulatory Visit
Admission: RE | Admit: 2017-12-07 | Discharge: 2017-12-07 | Disposition: A | Discharge status: Home / Self Care | Payer: Medicare Other | Source: Ambulatory Visit | Attending: Sports Medicine | Admitting: Sports Medicine

## 2017-12-07 DIAGNOSIS — M4800 Spinal stenosis, site unspecified: Secondary | ICD-10-CM

## 2017-12-07 DIAGNOSIS — M48061 Spinal stenosis, lumbar region without neurogenic claudication: Secondary | ICD-10-CM | POA: Diagnosis not present

## 2017-12-07 DIAGNOSIS — J3089 Other allergic rhinitis: Secondary | ICD-10-CM | POA: Diagnosis not present

## 2017-12-07 DIAGNOSIS — M5136 Other intervertebral disc degeneration, lumbar region: Secondary | ICD-10-CM

## 2017-12-07 DIAGNOSIS — J301 Allergic rhinitis due to pollen: Secondary | ICD-10-CM | POA: Diagnosis not present

## 2017-12-07 DIAGNOSIS — M5410 Radiculopathy, site unspecified: Secondary | ICD-10-CM

## 2017-12-09 DIAGNOSIS — M47816 Spondylosis without myelopathy or radiculopathy, lumbar region: Secondary | ICD-10-CM | POA: Diagnosis not present

## 2017-12-14 DIAGNOSIS — J3089 Other allergic rhinitis: Secondary | ICD-10-CM | POA: Diagnosis not present

## 2017-12-14 DIAGNOSIS — J301 Allergic rhinitis due to pollen: Secondary | ICD-10-CM | POA: Diagnosis not present

## 2017-12-15 DIAGNOSIS — Z961 Presence of intraocular lens: Secondary | ICD-10-CM | POA: Diagnosis not present

## 2017-12-15 DIAGNOSIS — H40023 Open angle with borderline findings, high risk, bilateral: Secondary | ICD-10-CM | POA: Diagnosis not present

## 2017-12-15 DIAGNOSIS — H40053 Ocular hypertension, bilateral: Secondary | ICD-10-CM | POA: Diagnosis not present

## 2017-12-15 LAB — HM DIABETES EYE EXAM

## 2017-12-21 DIAGNOSIS — H40033 Anatomical narrow angle, bilateral: Secondary | ICD-10-CM | POA: Diagnosis not present

## 2017-12-21 DIAGNOSIS — J301 Allergic rhinitis due to pollen: Secondary | ICD-10-CM | POA: Diagnosis not present

## 2017-12-21 DIAGNOSIS — J3089 Other allergic rhinitis: Secondary | ICD-10-CM | POA: Diagnosis not present

## 2017-12-21 NOTE — Assessment & Plan Note (Signed)
Continues to benefit with better sleep.  Good compliance and control. Plan-continue CPAP auto 5-15, update supplies.

## 2017-12-28 DIAGNOSIS — J3089 Other allergic rhinitis: Secondary | ICD-10-CM | POA: Diagnosis not present

## 2017-12-28 DIAGNOSIS — J301 Allergic rhinitis due to pollen: Secondary | ICD-10-CM | POA: Diagnosis not present

## 2017-12-28 DIAGNOSIS — M47816 Spondylosis without myelopathy or radiculopathy, lumbar region: Secondary | ICD-10-CM | POA: Diagnosis not present

## 2017-12-28 DIAGNOSIS — M5416 Radiculopathy, lumbar region: Secondary | ICD-10-CM | POA: Diagnosis not present

## 2018-01-04 DIAGNOSIS — J301 Allergic rhinitis due to pollen: Secondary | ICD-10-CM | POA: Diagnosis not present

## 2018-01-13 ENCOUNTER — Encounter: Payer: Self-pay | Admitting: Internal Medicine

## 2018-01-18 DIAGNOSIS — J3089 Other allergic rhinitis: Secondary | ICD-10-CM | POA: Diagnosis not present

## 2018-01-18 DIAGNOSIS — J301 Allergic rhinitis due to pollen: Secondary | ICD-10-CM | POA: Diagnosis not present

## 2018-01-26 DIAGNOSIS — J3081 Allergic rhinitis due to animal (cat) (dog) hair and dander: Secondary | ICD-10-CM | POA: Diagnosis not present

## 2018-01-26 DIAGNOSIS — J3089 Other allergic rhinitis: Secondary | ICD-10-CM | POA: Diagnosis not present

## 2018-01-26 DIAGNOSIS — J301 Allergic rhinitis due to pollen: Secondary | ICD-10-CM | POA: Diagnosis not present

## 2018-01-27 DIAGNOSIS — M47816 Spondylosis without myelopathy or radiculopathy, lumbar region: Secondary | ICD-10-CM | POA: Diagnosis not present

## 2018-02-01 DIAGNOSIS — J301 Allergic rhinitis due to pollen: Secondary | ICD-10-CM | POA: Diagnosis not present

## 2018-02-01 DIAGNOSIS — J3089 Other allergic rhinitis: Secondary | ICD-10-CM | POA: Diagnosis not present

## 2018-02-08 DIAGNOSIS — J3089 Other allergic rhinitis: Secondary | ICD-10-CM | POA: Diagnosis not present

## 2018-02-08 DIAGNOSIS — J301 Allergic rhinitis due to pollen: Secondary | ICD-10-CM | POA: Diagnosis not present

## 2018-02-11 ENCOUNTER — Ambulatory Visit (INDEPENDENT_AMBULATORY_CARE_PROVIDER_SITE_OTHER): Payer: Medicare Other | Admitting: Pulmonary Disease

## 2018-02-11 ENCOUNTER — Encounter: Payer: Self-pay | Admitting: Pulmonary Disease

## 2018-02-11 VITALS — BP 164/84 | HR 75 | Ht 68.0 in | Wt 300.0 lb

## 2018-02-11 DIAGNOSIS — J45909 Unspecified asthma, uncomplicated: Secondary | ICD-10-CM

## 2018-02-11 NOTE — Patient Instructions (Signed)
Continue inhalers as prescribed Follow-up with Dr. Annamaria Boots who can help manage both of sleep apnea and asthma going forward

## 2018-02-11 NOTE — Progress Notes (Signed)
Cheryl [name]    [id number]    12-28-1955  Primary Care Physician:Carter, Brayton Layman, DO  Referring Physician: [name], [address] [place], [place] [id number]  Chief complaint:   Follow up for asthma OSA  HPI: 62 year old with history of asthma, diabetes, hypertension, GERD, morbid obesity. She has a diagnosis of asthma for 5-6 years. She has been maintained for Qvar for the past couple of years with good control of symptoms. She [name] treatment of mold, mildew, dust, pollen allergies. She gets allergy shots every week.   Received clinic note from Dr. Donneta [name], [facility] dated 02/09/17 Sensitivity noted to dust mite, cockroach She is on immunotherapy for multiple allergies.  Pets: None Occupation: Retired Tour manager.  Exposures:Has been exposed to dust in her office. Smoking history: Never smoker  Interim History: Breathing is doing well on Flovent inhaler.  Reports minor worsening a few weeks ago when the weather was hot and humid otherwise she is stable Hardly needs to use her rescue inhaler.  Denies nighttime awakenings, cough, dyspnea, wheezing.  Outpatient Encounter Medications as of 02/11/2018  Medication Sig  . ACCU-CHEK AVIVA PLUS test strip CHECK BLOOD SUGAR ONCE DAILY AS INSTRUCTED DX E11.9  . acetaminophen (TYLENOL) 500 MG tablet Take 500 mg by mouth daily.  Marland Kitchen albuterol (PROVENTIL HFA;VENTOLIN HFA) 108 (90 Base) MCG/ACT inhaler Inhale 2 puffs into the lungs every 4 (four) hours as needed.  . AMBULATORY NON FORMULARY MEDICATION Medication Name: Allergy Injection- once weekly  . Ascorbic Acid (VITAMIN C) 100 MG tablet Take 1,000 mg by mouth daily.  Marland Kitchen aspirin 81 MG tablet Take 81 mg by mouth daily.  Marland Kitchen azelastine (ASTELIN) 0.1 % nasal spray 1-2 SPRAYS IN EACH NOSTRIL TWICE A DAY NASALLY 30 DAYS  . B-D ULTRA-FINE 33 LANCETS MISC 1 each by Does not apply route as directed.  . Calcium  Carbonate-Vitamin D (CALTRATE 600+D) 600-400 MG-UNIT per chew tablet Chew 1 tablet by mouth daily. Chew one tablet once a day  . EPIPEN 2-PAK 0.3 MG/0.3ML SOAJ injection   . FLOVENT HFA 110 MCG/ACT inhaler Inhale 2 puffs into the lungs 2 (two) times daily.  . fluticasone (FLONASE) 50 MCG/ACT nasal spray Place 1-2 sprays into both nostrils daily.  Marland Kitchen levocetirizine (XYZAL) 5 MG tablet Take 5 mg by mouth every evening.  . Multiple Vitamin (MULTIVITAMIN) tablet Take 1 tablet by mouth daily. Take one tablet once a day  . omeprazole (PRILOSEC) 40 MG capsule Take 1 capsule (40 mg total) by mouth daily.  . simvastatin (ZOCOR) 20 MG tablet TAKE ONE TABLET BY MOUTH ONCE DAILY AT BEDTIME FOR CHOLESTEROL  . triamterene-hydrochlorothiazide (MAXZIDE) 75-50 MG tablet TAKE ONE TABLET BY MOUTH ONCE DAILY FOR BLOOD PRESSURE  . vitamin E (VITAMIN E) 400 UNIT capsule Take 400 Units by mouth daily.   No facility-administered encounter medications on file as of 02/11/2018.    Physical Exam: Blood pressure (!) 164/84, pulse 75, height 5\' 8"  (1.727 m), weight 300 lb (136.1 kg), SpO2 98 %. Gen:      No acute distress HEENT:  EOMI, sclera anicteric Neck:     No masses; no thyromegaly Lungs:    Clear to auscultation bilaterally; normal respiratory effort CV:         Regular rate and rhythm; no murmurs Abd:      + bowel sounds; soft, non-tender; no palpable masses, no distension Ext:  No edema; adequate peripheral perfusion Skin:      Warm and dry; no rash Neuro: alert and oriented x 3 Psych: normal mood and affect  Data Reviewed: Imaging CT abdomen pelvis 03/13/08-clear lung images Chest x-ray 10/20/08-no acute cardiopulmonary abnormality Chest x-ray 03/17/09-no acute cardiopulmonary abnormality Chest x-ray 11/04/16-no acute cardiopulmonary abnormality  Chest x-ray 08/17/17- no acute cardiopulmonary abnormality. I reviewed all images personally.  PFTs 03/18/17 FVC 2.44 [80%], FEV1 2.13 [9%], F/F 87, TLC 120%,  DLCO 73% Mild hyperinflation with air trapping, minimal diffusion defect.  FENO 11/04/16- 18  Sleep PSG 11/25/16 Severe obstructive sleep apnea. AHI 40.3, low sats 88%.  Assessment:  Mild persistent asthma Well controlled on flovent inhaler.  She [name]  Severe sleep apnea Tolerating autoset CPAP therapy. Follows with Dr. Annamaria Boots Can consolidate pulmonary and sleep follow-ups with Dr. Annamaria Boots, who has agreed to continue seeing her.  Plan/Recommendations: - Flovent inhaler, albuterol as needed - CPAP for OSA  [name] MD [facility] 02/11/2018, 1:53 PM  CC: [name], DO

## 2018-02-12 DIAGNOSIS — S92504A Nondisplaced unspecified fracture of right lesser toe(s), initial encounter for closed fracture: Secondary | ICD-10-CM | POA: Diagnosis not present

## 2018-02-12 DIAGNOSIS — Z6841 Body Mass Index (BMI) 40.0 and over, adult: Secondary | ICD-10-CM | POA: Diagnosis not present

## 2018-02-15 DIAGNOSIS — M47816 Spondylosis without myelopathy or radiculopathy, lumbar region: Secondary | ICD-10-CM | POA: Diagnosis not present

## 2018-02-15 DIAGNOSIS — J3089 Other allergic rhinitis: Secondary | ICD-10-CM | POA: Diagnosis not present

## 2018-02-15 DIAGNOSIS — J301 Allergic rhinitis due to pollen: Secondary | ICD-10-CM | POA: Diagnosis not present

## 2018-02-15 DIAGNOSIS — M5416 Radiculopathy, lumbar region: Secondary | ICD-10-CM | POA: Diagnosis not present

## 2018-02-17 DIAGNOSIS — L669 Cicatricial alopecia, unspecified: Secondary | ICD-10-CM | POA: Diagnosis not present

## 2018-02-21 ENCOUNTER — Other Ambulatory Visit: Payer: Self-pay | Admitting: Internal Medicine

## 2018-02-21 DIAGNOSIS — E785 Hyperlipidemia, unspecified: Secondary | ICD-10-CM

## 2018-02-21 DIAGNOSIS — E119 Type 2 diabetes mellitus without complications: Secondary | ICD-10-CM

## 2018-02-22 DIAGNOSIS — J3089 Other allergic rhinitis: Secondary | ICD-10-CM | POA: Diagnosis not present

## 2018-02-22 DIAGNOSIS — J301 Allergic rhinitis due to pollen: Secondary | ICD-10-CM | POA: Diagnosis not present

## 2018-03-01 DIAGNOSIS — J3089 Other allergic rhinitis: Secondary | ICD-10-CM | POA: Diagnosis not present

## 2018-03-01 DIAGNOSIS — J301 Allergic rhinitis due to pollen: Secondary | ICD-10-CM | POA: Diagnosis not present

## 2018-03-08 DIAGNOSIS — J3089 Other allergic rhinitis: Secondary | ICD-10-CM | POA: Diagnosis not present

## 2018-03-08 DIAGNOSIS — J301 Allergic rhinitis due to pollen: Secondary | ICD-10-CM | POA: Diagnosis not present

## 2018-03-15 DIAGNOSIS — J301 Allergic rhinitis due to pollen: Secondary | ICD-10-CM | POA: Diagnosis not present

## 2018-03-15 DIAGNOSIS — J3089 Other allergic rhinitis: Secondary | ICD-10-CM | POA: Diagnosis not present

## 2018-03-19 ENCOUNTER — Other Ambulatory Visit: Payer: Medicare Other

## 2018-03-19 DIAGNOSIS — E785 Hyperlipidemia, unspecified: Secondary | ICD-10-CM | POA: Diagnosis not present

## 2018-03-19 DIAGNOSIS — E119 Type 2 diabetes mellitus without complications: Secondary | ICD-10-CM

## 2018-03-20 LAB — LIPID PANEL
Cholesterol: 151 mg/dL (ref <200)
HDL: 51 mg/dL (ref >50)
LDL Cholesterol (Calc): 80 mg/dL (calc)
Non-HDL Cholesterol (Calc): 100 mg/dL (calc) (ref <130)
TRIGLYCERIDES: 107 mg/dL (ref <150)
Total CHOL/HDL Ratio: 3 (calc) (ref <5.0)

## 2018-03-20 LAB — COMPLETE METABOLIC PANEL WITH GFR
AG RATIO: 2 (calc) (ref 1.0–2.5)
ALBUMIN MSPROF: 4.5 g/dL (ref 3.6–5.1)
ALKALINE PHOSPHATASE (APISO): 61 U/L (ref 33–130)
ALT: 13 U/L (ref 6–29)
AST: 14 U/L (ref 10–35)
BILIRUBIN TOTAL: 0.5 mg/dL (ref 0.2–1.2)
BUN: 11 mg/dL (ref 7–25)
CHLORIDE: 103 mmol/L (ref 98–110)
CO2: 29 mmol/L (ref 20–32)
Calcium: 9.6 mg/dL (ref 8.6–10.4)
Creat: 0.81 mg/dL (ref 0.50–0.99)
GFR, EST AFRICAN AMERICAN: 89 mL/min/{1.73_m2} (ref >=60)
GFR, Est Non African American: 77 mL/min/{1.73_m2} (ref >=60)
GLOBULIN: 2.2 g/dL (ref 1.9–3.7)
GLUCOSE: 102 mg/dL — AB (ref 65–99)
POTASSIUM: 3.7 mmol/L (ref 3.5–5.3)
SODIUM: 142 mmol/L (ref 135–146)
TOTAL PROTEIN: 6.7 g/dL (ref 6.1–8.1)

## 2018-03-20 LAB — HEMOGLOBIN A1C
EAG (MMOL/L): 7.6 (calc)
Hgb A1c MFr Bld: 6.4 % of total Hgb — ABNORMAL HIGH (ref <5.7)
Mean Plasma Glucose: 137 (calc)

## 2018-03-22 DIAGNOSIS — J3089 Other allergic rhinitis: Secondary | ICD-10-CM | POA: Diagnosis not present

## 2018-03-22 DIAGNOSIS — J301 Allergic rhinitis due to pollen: Secondary | ICD-10-CM | POA: Diagnosis not present

## 2018-03-23 ENCOUNTER — Ambulatory Visit (INDEPENDENT_AMBULATORY_CARE_PROVIDER_SITE_OTHER): Payer: Medicare Other | Admitting: Internal Medicine

## 2018-03-23 ENCOUNTER — Encounter: Payer: Self-pay | Admitting: Internal Medicine

## 2018-03-23 VITALS — BP 122/78 | HR 71 | Temp 98.5°F | Ht 68.0 in | Wt 298.0 lb

## 2018-03-23 DIAGNOSIS — E2839 Other primary ovarian failure: Secondary | ICD-10-CM

## 2018-03-23 DIAGNOSIS — M8580 Other specified disorders of bone density and structure, unspecified site: Secondary | ICD-10-CM

## 2018-03-23 DIAGNOSIS — M159 Polyosteoarthritis, unspecified: Secondary | ICD-10-CM | POA: Diagnosis not present

## 2018-03-23 DIAGNOSIS — E785 Hyperlipidemia, unspecified: Secondary | ICD-10-CM

## 2018-03-23 DIAGNOSIS — J301 Allergic rhinitis due to pollen: Secondary | ICD-10-CM | POA: Diagnosis not present

## 2018-03-23 DIAGNOSIS — G8929 Other chronic pain: Secondary | ICD-10-CM | POA: Diagnosis not present

## 2018-03-23 DIAGNOSIS — E119 Type 2 diabetes mellitus without complications: Secondary | ICD-10-CM

## 2018-03-23 DIAGNOSIS — M5442 Lumbago with sciatica, left side: Secondary | ICD-10-CM

## 2018-03-23 DIAGNOSIS — I10 Essential (primary) hypertension: Secondary | ICD-10-CM

## 2018-03-23 MED ORDER — LEVOCETIRIZINE DIHYDROCHLORIDE 5 MG PO TABS: 5.0000 mg | ORAL_TABLET | Freq: Every evening | ORAL | 1 refills | Status: DC

## 2018-03-23 NOTE — Progress Notes (Signed)
Patient ID: Cheryl [name], female   DOB: 1955/10/06, 62 y.o.   MRN: [id number]   Location:  [facility]  Provider: DR Arletha Grippe  Code Status:  Goals of Care:  Advanced Directives 11/13/2017  Does Patient Have a Medical Advance Directive? Yes  Type of Paramedic of [place];Living will  Does patient want to make changes to medical advance directive? No - Patient declined  Copy of [place] in Chart? No - copy requested  Would patient like information on creating a medical advance directive? -     Chief Complaint  Patient presents with  . Medical Management of Chronic Issues    4 month follow-up and DM foot exam  . Medication Refill    Refill Xyzal- 90 day supply   . Health Maintenance    Discuss need for HIV Screening     HPI: Patient is a 62 y.o. female seen today for medical management of chronic diseases.  She reports feeling well. She has tried exercising but has poor exercise tolerance. She needs RF on xyzal. She states she has noticed that her brother "is watching me" and she constantly feels like someone is following her. She took a trip to the [place] and did not have the sensation.  She completed AWV on 11/13/17.   Hyperlipidemia - stable on simvastatin. LDL 80  HTN - stable on maxzide. Takes ASA daily  DM - BS <100 most days. She checks CBGs 3 times per week. No low BS reactions. Occasional numbness/tingling in ands/feet. Metformin stopped by Endo Dr Dwyane Dee. A1c 6.4% (prev 6.2%). Endo told to f/u prn. Last eye exam in Nov 2018 by [facility] - no diabetic eye disease. urine microalbumin/Cr ratio 11. She is on statin  GERD - stable on omeprazole  allergic rhinitis/asthma - she reports issues with seasonal allergy improved. She is taking shots, using nasal flonase and veramyst, Qvar inhaler. Allegra changed to xyzal. She does not use nasal saline spray. Also using ginger and lemon tea. She feels nauseated from  post nasal drip. She is followed by pulm Dr Vaughan Browner - PFTs completed and reveals mild asthma. Sleep study completed in July 2018 revealed OSA and she is now on CPAP 5-15 auto qhs.  Arthritis - pain stable on prn tramadol. Followed by Dr Percell Miller (ortho). She had left knee pain and saw Dr Percell Miller who treated left hip bursitis with injection which helped knee.  Glaucoma - OU. She had laser procedure in March and April. Followed by Dr Arlina Robes at [facility] on [address]. Uses eye gtts, alaway prn. She is s/p OU cats with IOL on Sept and Oct 2018. She now has a glare at night  Obesity - she has fallen off her diet/exercise program due to joint pain. BMI 45.93.  Osteopenia - last DXA in 2015.  Past Medical History:  Diagnosis Date  . Allergic rhinitis, cause unspecified   . Arthritis   . Asthma   . Carpal tunnel syndrome   . Cataract   . Diabetes mellitus without complication (HCC)    diet- controlled  . GERD (gastroesophageal reflux disease)   . Glaucoma   . Hypertension   . Impaired fasting glucose   . Morbid obesity ([place])   . OSA (obstructive sleep apnea) 12/02/2016  . Other malaise and fatigue   . Other specified cardiac dysrhythmias(427.89)   . Pain in joint, site unspecified   . Symptomatic menopausal or female climacteric states  Past Surgical History:  Procedure Laterality Date  . ABDOMINAL HYSTERECTOMY     1994  . carpal tunnel both hands     2012  . CLOSED MANIPULATION SHOULDER Left 04/2014  . EYE SURGERY Left 08/25/2015  . EYE SURGERY Right 09/24/2015  . JOINT REPLACEMENT     both knees replacement, 2010  . mole removed from face    . Nodule removed from back    . SHOULDER SURGERY Left 11/2013  . TONSILLECTOMY     as teenager     reports that she has never smoked. She has never used smokeless tobacco. She reports that she does not drink alcohol or use drugs. Social History   Socioeconomic History  . Marital status: Single    Spouse name: Not on file  .  Number of children: Not on file  . Years of education: Not on file  . Highest education level: Not on file  Occupational History  . Not on file  Social Needs  . Financial resource strain: Not hard at all  . Food insecurity:    Worry: Never true    Inability: Never true  . Transportation needs:    Medical: No    Non-medical: No  Tobacco Use  . Smoking status: Never Smoker  . Smokeless tobacco: Never Used  Substance and Sexual Activity  . Alcohol use: No  . Drug use: No  . 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 Physical activity:    Days per week: 0 days    Minutes per session: 0 min  . Stress: To some extent  Relationships  . Social connections:    Talks on phone: More than three times a week    Gets together: Never    Attends religious service: Never    Active member of club or organization: No    Attends meetings of clubs or organizations: Never    Relationship status: Never married  . Intimate partner violence:    Fear of current or ex partner: No    Emotionally abused: No    Physically abused: No    Forced sexual activity: No  Other Topics Concern  . Not on file  Social History Narrative  . Not on file    Family History  Problem Relation Age of Onset  . Cancer Father   . Diabetes Father   . Diabetes Brother   . Diabetes Paternal Aunt   . Lung cancer Cousin     Allergies  Allergen Reactions  . Oxycodone     Had stomach and headache as side effect from medicine  . Sulfur Diarrhea    Outpatient Encounter Medications as of 03/23/2018  Medication Sig  . ACCU-CHEK AVIVA PLUS test strip CHECK BLOOD SUGAR ONCE DAILY AS INSTRUCTED DX E11.9  . acetaminophen (TYLENOL) 500 MG tablet Take 500 mg by mouth daily.  Marland Kitchen albuterol (PROVENTIL HFA;VENTOLIN HFA) 108 (90 Base) MCG/ACT inhaler Inhale 2 puffs into the lungs every 4 (four) hours as needed.  . AMBULATORY NON FORMULARY MEDICATION Medication Name: Allergy Injection- once weekly  .  Ascorbic Acid (VITAMIN C) 100 MG tablet Take 1,000 mg by mouth daily.  Marland Kitchen aspirin 81 MG tablet Take 81 mg by mouth daily.  Marland Kitchen azelastine (ASTELIN) 0.1 % nasal spray 1-2 SPRAYS IN EACH NOSTRIL TWICE A DAY NASALLY 30 DAYS  . B-D ULTRA-FINE 33 LANCETS MISC 1 each by Does not apply route as directed.  . Calcium Carbonate-Vitamin D (CALTRATE  600+D) 600-400 MG-UNIT per chew tablet Chew 1 tablet by mouth daily. Chew one tablet once a day  . EPIPEN 2-PAK 0.3 MG/0.3ML SOAJ injection   . FLOVENT HFA 110 MCG/ACT inhaler Inhale 2 puffs into the lungs 2 (two) times daily.  . fluticasone (FLONASE) 50 MCG/ACT nasal spray Place 1-2 sprays into both nostrils daily.  Marland Kitchen levocetirizine (XYZAL) 5 MG tablet Take 5 mg by mouth every evening.  . Multiple Vitamin (MULTIVITAMIN) tablet Take 1 tablet by mouth daily. Take one tablet once a day  . omeprazole (PRILOSEC) 40 MG capsule Take 1 capsule (40 mg total) by mouth daily.  . simvastatin (ZOCOR) 20 MG tablet TAKE ONE TABLET BY MOUTH ONCE DAILY AT BEDTIME FOR CHOLESTEROL  . triamterene-hydrochlorothiazide (MAXZIDE) 75-50 MG tablet TAKE ONE TABLET BY MOUTH ONCE DAILY FOR BLOOD PRESSURE  . vitamin E (VITAMIN E) 400 UNIT capsule Take 400 Units by mouth daily.   No facility-administered encounter medications on file as of 03/23/2018.     Review of Systems:  Review of Systems  Health Maintenance  Topic Date Due  . HIV Screening  10/12/1968  . FOOT EXAM  11/19/2017  . Hepatitis C Screening  11/18/2018 (Originally Feb 08, 1956)  . URINE MICROALBUMIN  05/11/2018  . HEMOGLOBIN A1C  09/18/2018  . MAMMOGRAM  11/25/2018  . OPHTHALMOLOGY EXAM  12/16/2018  . COLONOSCOPY  03/11/2020  . TETANUS/TDAP  08/03/2021  . INFLUENZA VACCINE  Completed  . PAP SMEAR  Discontinued    Physical Exam: Vitals:   03/23/18 1547  BP: 122/78  Pulse: 71  Temp: 98.5 F (36.9 C)  TempSrc: Oral  SpO2: 96%  Weight: 298 lb (135.2 kg)  Height: 5\' 8"  (1.727 m)   Body mass index is 45.31  kg/m. Physical Exam  Constitutional: She is oriented to person, place, and time. She appears well-developed and well-nourished.  HENT:  Mouth/Throat: Oropharynx is clear and moist. No oropharyngeal exudate.  MMM; no oral thrush  Eyes: Pupils are equal, round, and reactive to light. No scleral icterus.  Neck: Neck supple. Carotid bruit is not present. No tracheal deviation present. No thyromegaly present.  Cardiovascular: Normal rate, regular rhythm and intact distal pulses. Exam reveals no gallop and no friction rub.  Murmur (1/6 SEM) heard. +1 pitting LE edema b/l; no calf TTP  Pulmonary/Chest: Effort normal and breath sounds normal. No stridor. No respiratory distress. She has no wheezes. She has no rales.  Abdominal: Soft. Normal appearance and bowel sounds are normal. She exhibits no distension and no mass. There is no hepatomegaly. There is no tenderness. There is no rigidity, no rebound and no guarding. No hernia.  Musculoskeletal: She exhibits edema (small and large joints).  Lymphadenopathy:    She has no cervical adenopathy.  Neurological: She is alert and oriented to person, place, and time. She has normal reflexes.  Skin: Skin is warm and dry. No rash noted.  Psychiatric: She has a normal mood and affect. Her behavior is normal. Judgment and thought content normal.    Labs reviewed: Basic Metabolic Panel: Recent Labs    05/11/17 1015 11/13/17 1025 03/19/18 1040  NA 142 142 142  K 3.9 3.9 3.7  CL 103 102 103  CO2 31 32 29  GLUCOSE 101* 90 102*  BUN 11 19 11   CREATININE 0.77 0.83 0.81  CALCIUM 9.9 9.7 9.6  TSH  --  3.14  --    Liver Function Tests: Recent Labs    05/11/17 1015 11/13/17 1025 03/19/18 1040  AST  --  14 14  ALT 11 11 13   BILITOT  --  0.5 0.5  PROT  --  7.1 6.7   No results for input(s): LIPASE, AMYLASE in the last 8760 hours. No results for input(s): AMMONIA in the last 8760 hours. CBC: Recent Labs    11/13/17 1025  WBC 9.4  NEUTROABS  5,922  HGB 12.9  HCT 38.7  MCV 80.6  PLT 371   Lipid Panel: Recent Labs    05/11/17 1015 11/13/17 1025 03/19/18 1040  CHOL 156 160 151  HDL 55 53 51  LDLCALC 83 90 80  TRIG 90 82 107  CHOLHDL 2.8 3.0 3.0   Lab Results  Component Value Date   HGBA1C 6.4 (H) 03/19/2018    Procedures since last visit: No results found.  Assessment/Plan   ICD-10-CM   1. Chronic seasonal allergic rhinitis due to pollen J30.1 levocetirizine (XYZAL) 5 MG tablet  2. Type 2 diabetes mellitus without complication, without long-term current use of insulin (HCC) E11.9   3. Hyperlipidemia LDL goal <100 E78.5   4. Essential hypertension, benign I10   5. Generalized osteoarthrosis, involving multiple sites M15.9   6. Chronic left-sided low back pain with left-sided sciatica M54.42    G89.29   7. Osteopenia, unspecified location M85.80 DG Bone Density  8. Estrogen deficiency E28.39 DG Bone Density   Continue current medications as ordered  Follow up with specialists as scheduled  Increase exercise as tolerated - recommend low impact exercises to strengthen bones and muscles  Follow up in 4 mos with Cheryl Mustache, NP-C. Fasting labs prior to appt (cmp, lipid panel, a1c, urine microalbumin/Cr ratio)  Cheryl [name]  [facility] [address] [place], [place] [address] [phone] Cell (Monday-Friday 8 AM - 5 PM) [phone] After 5 PM and follow prompts

## 2018-03-23 NOTE — Patient Instructions (Signed)
Continue current medications as ordered  Follow up with specialists as scheduled  Increase exercise as tolerated - recommend low impact exercises to strengthen bones and muscles  Follow up in 4 mos with Sherrie Mustache, NP-C. Fasting labs prior to appt

## 2018-03-29 DIAGNOSIS — J3089 Other allergic rhinitis: Secondary | ICD-10-CM | POA: Diagnosis not present

## 2018-03-29 DIAGNOSIS — J301 Allergic rhinitis due to pollen: Secondary | ICD-10-CM | POA: Diagnosis not present

## 2018-03-31 DIAGNOSIS — L669 Cicatricial alopecia, unspecified: Secondary | ICD-10-CM | POA: Diagnosis not present

## 2018-04-05 DIAGNOSIS — J301 Allergic rhinitis due to pollen: Secondary | ICD-10-CM | POA: Diagnosis not present

## 2018-04-05 DIAGNOSIS — J4530 Mild persistent asthma, uncomplicated: Secondary | ICD-10-CM | POA: Diagnosis not present

## 2018-04-05 DIAGNOSIS — H1045 Other chronic allergic conjunctivitis: Secondary | ICD-10-CM | POA: Diagnosis not present

## 2018-04-05 DIAGNOSIS — J3089 Other allergic rhinitis: Secondary | ICD-10-CM | POA: Diagnosis not present

## 2018-04-06 DIAGNOSIS — J301 Allergic rhinitis due to pollen: Secondary | ICD-10-CM | POA: Diagnosis not present

## 2018-04-06 DIAGNOSIS — J3089 Other allergic rhinitis: Secondary | ICD-10-CM | POA: Diagnosis not present

## 2018-04-12 DIAGNOSIS — J3089 Other allergic rhinitis: Secondary | ICD-10-CM | POA: Diagnosis not present

## 2018-04-12 DIAGNOSIS — J301 Allergic rhinitis due to pollen: Secondary | ICD-10-CM | POA: Diagnosis not present

## 2018-04-13 ENCOUNTER — Ambulatory Visit
Admission: RE | Admit: 2018-04-13 | Discharge: 2018-04-13 | Disposition: A | Discharge status: Home / Self Care | Payer: Medicare Other | Source: Ambulatory Visit | Attending: Internal Medicine | Admitting: Internal Medicine

## 2018-04-13 DIAGNOSIS — M8580 Other specified disorders of bone density and structure, unspecified site: Secondary | ICD-10-CM

## 2018-04-13 DIAGNOSIS — Z13820 Encounter for screening for osteoporosis: Secondary | ICD-10-CM | POA: Diagnosis not present

## 2018-04-13 DIAGNOSIS — E2839 Other primary ovarian failure: Secondary | ICD-10-CM

## 2018-04-13 DIAGNOSIS — Z780 Asymptomatic menopausal state: Secondary | ICD-10-CM | POA: Diagnosis not present

## 2018-04-15 DIAGNOSIS — J3089 Other allergic rhinitis: Secondary | ICD-10-CM | POA: Diagnosis not present

## 2018-04-15 DIAGNOSIS — J301 Allergic rhinitis due to pollen: Secondary | ICD-10-CM | POA: Diagnosis not present

## 2018-04-19 DIAGNOSIS — J3089 Other allergic rhinitis: Secondary | ICD-10-CM | POA: Diagnosis not present

## 2018-04-19 DIAGNOSIS — J301 Allergic rhinitis due to pollen: Secondary | ICD-10-CM | POA: Diagnosis not present

## 2018-04-21 DIAGNOSIS — J3089 Other allergic rhinitis: Secondary | ICD-10-CM | POA: Diagnosis not present

## 2018-04-21 DIAGNOSIS — J301 Allergic rhinitis due to pollen: Secondary | ICD-10-CM | POA: Diagnosis not present

## 2018-04-26 DIAGNOSIS — J301 Allergic rhinitis due to pollen: Secondary | ICD-10-CM | POA: Diagnosis not present

## 2018-04-26 DIAGNOSIS — J3089 Other allergic rhinitis: Secondary | ICD-10-CM | POA: Diagnosis not present

## 2018-05-03 DIAGNOSIS — J3089 Other allergic rhinitis: Secondary | ICD-10-CM | POA: Diagnosis not present

## 2018-05-03 DIAGNOSIS — J301 Allergic rhinitis due to pollen: Secondary | ICD-10-CM | POA: Diagnosis not present

## 2018-05-10 ENCOUNTER — Other Ambulatory Visit: Payer: Self-pay

## 2018-05-10 DIAGNOSIS — J3089 Other allergic rhinitis: Secondary | ICD-10-CM | POA: Diagnosis not present

## 2018-05-10 DIAGNOSIS — K219 Gastro-esophageal reflux disease without esophagitis: Secondary | ICD-10-CM

## 2018-05-10 DIAGNOSIS — J301 Allergic rhinitis due to pollen: Secondary | ICD-10-CM | POA: Diagnosis not present

## 2018-05-10 MED ORDER — OMEPRAZOLE 40 MG PO CPDR: 40.0000 mg | DELAYED_RELEASE_CAPSULE | Freq: Every day | ORAL | 1 refills | Status: DC

## 2018-05-14 DIAGNOSIS — L669 Cicatricial alopecia, unspecified: Secondary | ICD-10-CM | POA: Diagnosis not present

## 2018-05-17 DIAGNOSIS — J301 Allergic rhinitis due to pollen: Secondary | ICD-10-CM | POA: Diagnosis not present

## 2018-05-17 DIAGNOSIS — J3089 Other allergic rhinitis: Secondary | ICD-10-CM | POA: Diagnosis not present

## 2018-05-24 DIAGNOSIS — J301 Allergic rhinitis due to pollen: Secondary | ICD-10-CM | POA: Diagnosis not present

## 2018-05-24 DIAGNOSIS — J3089 Other allergic rhinitis: Secondary | ICD-10-CM | POA: Diagnosis not present

## 2018-05-31 DIAGNOSIS — J301 Allergic rhinitis due to pollen: Secondary | ICD-10-CM | POA: Diagnosis not present

## 2018-05-31 DIAGNOSIS — J3089 Other allergic rhinitis: Secondary | ICD-10-CM | POA: Diagnosis not present

## 2018-06-07 DIAGNOSIS — J301 Allergic rhinitis due to pollen: Secondary | ICD-10-CM | POA: Diagnosis not present

## 2018-06-07 DIAGNOSIS — J3089 Other allergic rhinitis: Secondary | ICD-10-CM | POA: Diagnosis not present

## 2018-06-14 DIAGNOSIS — J301 Allergic rhinitis due to pollen: Secondary | ICD-10-CM | POA: Diagnosis not present

## 2018-06-14 DIAGNOSIS — J3089 Other allergic rhinitis: Secondary | ICD-10-CM | POA: Diagnosis not present

## 2018-06-21 DIAGNOSIS — J3089 Other allergic rhinitis: Secondary | ICD-10-CM | POA: Diagnosis not present

## 2018-06-21 DIAGNOSIS — J301 Allergic rhinitis due to pollen: Secondary | ICD-10-CM | POA: Diagnosis not present

## 2018-06-28 DIAGNOSIS — J3089 Other allergic rhinitis: Secondary | ICD-10-CM | POA: Diagnosis not present

## 2018-06-28 DIAGNOSIS — J3081 Allergic rhinitis due to animal (cat) (dog) hair and dander: Secondary | ICD-10-CM | POA: Diagnosis not present

## 2018-06-28 DIAGNOSIS — J301 Allergic rhinitis due to pollen: Secondary | ICD-10-CM | POA: Diagnosis not present

## 2018-06-30 DIAGNOSIS — L669 Cicatricial alopecia, unspecified: Secondary | ICD-10-CM | POA: Diagnosis not present

## 2018-07-05 DIAGNOSIS — J3089 Other allergic rhinitis: Secondary | ICD-10-CM | POA: Diagnosis not present

## 2018-07-05 DIAGNOSIS — J301 Allergic rhinitis due to pollen: Secondary | ICD-10-CM | POA: Diagnosis not present

## 2018-07-12 DIAGNOSIS — J301 Allergic rhinitis due to pollen: Secondary | ICD-10-CM | POA: Diagnosis not present

## 2018-07-12 DIAGNOSIS — J3089 Other allergic rhinitis: Secondary | ICD-10-CM | POA: Diagnosis not present

## 2018-07-14 ENCOUNTER — Other Ambulatory Visit: Payer: Self-pay

## 2018-07-14 DIAGNOSIS — E2839 Other primary ovarian failure: Secondary | ICD-10-CM

## 2018-07-14 DIAGNOSIS — E785 Hyperlipidemia, unspecified: Secondary | ICD-10-CM

## 2018-07-14 DIAGNOSIS — E119 Type 2 diabetes mellitus without complications: Secondary | ICD-10-CM

## 2018-07-14 DIAGNOSIS — I10 Essential (primary) hypertension: Secondary | ICD-10-CM

## 2018-07-19 DIAGNOSIS — J301 Allergic rhinitis due to pollen: Secondary | ICD-10-CM | POA: Diagnosis not present

## 2018-07-19 DIAGNOSIS — J3089 Other allergic rhinitis: Secondary | ICD-10-CM | POA: Diagnosis not present

## 2018-07-23 ENCOUNTER — Other Ambulatory Visit: Payer: Medicare Other

## 2018-07-23 DIAGNOSIS — E785 Hyperlipidemia, unspecified: Secondary | ICD-10-CM | POA: Diagnosis not present

## 2018-07-23 DIAGNOSIS — E2839 Other primary ovarian failure: Secondary | ICD-10-CM

## 2018-07-23 DIAGNOSIS — E119 Type 2 diabetes mellitus without complications: Secondary | ICD-10-CM

## 2018-07-23 DIAGNOSIS — I10 Essential (primary) hypertension: Secondary | ICD-10-CM | POA: Diagnosis not present

## 2018-07-24 LAB — LIPID PANEL
CHOL/HDL RATIO: 3 (calc) (ref <5.0)
CHOLESTEROL: 155 mg/dL (ref <200)
HDL: 52 mg/dL (ref >=50)
LDL CHOLESTEROL (CALC): 86 mg/dL
Non-HDL Cholesterol (Calc): 103 mg/dL (calc) (ref <130)
Triglycerides: 83 mg/dL (ref <150)

## 2018-07-24 LAB — COMPLETE METABOLIC PANEL WITH GFR
AG Ratio: 1.8 (calc) (ref 1.0–2.5)
ALBUMIN MSPROF: 4.6 g/dL (ref 3.6–5.1)
ALT: 14 U/L (ref 6–29)
AST: 17 U/L (ref 10–35)
Alkaline phosphatase (APISO): 58 U/L (ref 37–153)
BILIRUBIN TOTAL: 0.5 mg/dL (ref 0.2–1.2)
BUN: 12 mg/dL (ref 7–25)
CALCIUM: 9.9 mg/dL (ref 8.6–10.4)
CO2: 30 mmol/L (ref 20–32)
Chloride: 103 mmol/L (ref 98–110)
Creat: 0.7 mg/dL (ref 0.50–0.99)
GFR, EST AFRICAN AMERICAN: 106 mL/min/{1.73_m2} (ref >=60)
GFR, EST NON AFRICAN AMERICAN: 92 mL/min/{1.73_m2} (ref >=60)
GLUCOSE: 99 mg/dL (ref 65–99)
Globulin: 2.5 g/dL (calc) (ref 1.9–3.7)
Potassium: 3.6 mmol/L (ref 3.5–5.3)
Sodium: 142 mmol/L (ref 135–146)
Total Protein: 7.1 g/dL (ref 6.1–8.1)

## 2018-07-24 LAB — HEMOGLOBIN A1C
Hgb A1c MFr Bld: 6.3 % of total Hgb — ABNORMAL HIGH (ref <5.7)
Mean Plasma Glucose: 134 (calc)
eAG (mmol/L): 7.4 (calc)

## 2018-07-26 DIAGNOSIS — J301 Allergic rhinitis due to pollen: Secondary | ICD-10-CM | POA: Diagnosis not present

## 2018-07-26 DIAGNOSIS — J3089 Other allergic rhinitis: Secondary | ICD-10-CM | POA: Diagnosis not present

## 2018-07-27 ENCOUNTER — Encounter: Payer: Self-pay | Admitting: Nurse Practitioner

## 2018-07-27 ENCOUNTER — Ambulatory Visit (INDEPENDENT_AMBULATORY_CARE_PROVIDER_SITE_OTHER): Payer: Medicare Other | Admitting: Nurse Practitioner

## 2018-07-27 ENCOUNTER — Ambulatory Visit: Payer: Medicare Other | Admitting: Nurse Practitioner

## 2018-07-27 VITALS — BP 130/78 | HR 66 | Temp 98.4°F | Ht 68.0 in | Wt 300.0 lb

## 2018-07-27 DIAGNOSIS — G4733 Obstructive sleep apnea (adult) (pediatric): Secondary | ICD-10-CM | POA: Diagnosis not present

## 2018-07-27 DIAGNOSIS — E119 Type 2 diabetes mellitus without complications: Secondary | ICD-10-CM

## 2018-07-27 DIAGNOSIS — E2839 Other primary ovarian failure: Secondary | ICD-10-CM | POA: Diagnosis not present

## 2018-07-27 DIAGNOSIS — K219 Gastro-esophageal reflux disease without esophagitis: Secondary | ICD-10-CM | POA: Diagnosis not present

## 2018-07-27 DIAGNOSIS — M159 Polyosteoarthritis, unspecified: Secondary | ICD-10-CM | POA: Diagnosis not present

## 2018-07-27 DIAGNOSIS — E785 Hyperlipidemia, unspecified: Secondary | ICD-10-CM

## 2018-07-27 DIAGNOSIS — M8580 Other specified disorders of bone density and structure, unspecified site: Secondary | ICD-10-CM

## 2018-07-27 DIAGNOSIS — I10 Essential (primary) hypertension: Secondary | ICD-10-CM | POA: Diagnosis not present

## 2018-07-27 MED ORDER — LISINOPRIL 5 MG PO TABS: 5.0000 mg | ORAL_TABLET | Freq: Every day | ORAL | 0 refills | Status: DC

## 2018-07-27 NOTE — Patient Instructions (Signed)
Follow up in 3 weeks on blood pressure/lisinipril start    DASH Eating Plan DASH stands for "Dietary Approaches to Stop Hypertension." The DASH eating plan is a healthy eating plan that has been shown to reduce high blood pressure (hypertension). It may also reduce your risk for type 2 diabetes, heart disease, and stroke. The DASH eating plan may also help with weight loss. What are tips for following this plan?  General guidelines  Avoid eating more than 2,300 mg (milligrams) of salt (sodium) a day. If you have hypertension, you may need to reduce your sodium intake to 1,500 mg a day.  Limit alcohol intake to no more than 1 drink a day for nonpregnant women and 2 drinks a day for men. One drink equals 12 oz of beer, 5 oz of wine, or 1 oz of hard liquor.  Work with your health care provider to maintain a healthy body weight or to lose weight. Ask what an ideal weight is for you.  Get at least 30 minutes of exercise that causes your heart to beat faster (aerobic exercise) most days of the week. Activities may include walking, swimming, or biking.  Work with your health care provider or diet and nutrition specialist (dietitian) to adjust your eating plan to your individual calorie needs. Reading food labels   Check food labels for the amount of sodium per serving. Choose foods with less than 5 percent of the Daily Value of sodium. Generally, foods with less than 300 mg of sodium per serving fit into this eating plan.  To find whole grains, look for the word "whole" as the first word in the ingredient list. Shopping  Buy products labeled as "low-sodium" or "no salt added."  Buy fresh foods. Avoid canned foods and premade or frozen meals. Cooking  Avoid adding salt when cooking. Use salt-free seasonings or herbs instead of table salt or sea salt. Check with your health care provider or pharmacist before using salt substitutes.  Do not fry foods. Cook foods using healthy methods such as  baking, boiling, grilling, and broiling instead.  Cook with heart-healthy oils, such as olive, canola, soybean, or sunflower oil. Meal planning  Eat a balanced diet that includes: ? 5 or more servings of fruits and vegetables each day. At each meal, try to fill half of your plate with fruits and vegetables. ? Up to 6-8 servings of whole grains each day. ? Less than 6 oz of lean meat, poultry, or fish each day. A 3-oz serving of meat is about the same size as a deck of cards. One egg equals 1 oz. ? 2 servings of low-fat dairy each day. ? A serving of nuts, seeds, or beans 5 times each week. ? Heart-healthy fats. Healthy fats called Omega-3 fatty acids are found in foods such as flaxseeds and coldwater fish, like sardines, salmon, and mackerel.  Limit how much you eat of the following: ? Canned or prepackaged foods. ? Food that is high in trans fat, such as fried foods. ? Food that is high in saturated fat, such as fatty meat. ? Sweets, desserts, sugary drinks, and other foods with added sugar. ? Full-fat dairy products.  Do not salt foods before eating.  Try to eat at least 2 vegetarian meals each week.  Eat more home-cooked food and less restaurant, buffet, and fast food.  When eating at a restaurant, ask that your food be prepared with less salt or no salt, if possible. What foods are recommended? The items  listed may not be a complete list. Talk with your dietitian about what dietary choices are best for you. Grains Whole-grain or whole-wheat bread. Whole-grain or whole-wheat pasta. Brown rice. [place]. Bulgur. Whole-grain and low-sodium cereals. Pita bread. Low-fat, low-sodium crackers. Whole-wheat flour tortillas. Vegetables Fresh or frozen vegetables (raw, steamed, roasted, or grilled). Low-sodium or reduced-sodium tomato and vegetable juice. Low-sodium or reduced-sodium tomato sauce and tomato paste. Low-sodium or reduced-sodium canned vegetables. Fruits All fresh,  dried, or frozen fruit. Canned fruit in natural juice (without added sugar). Meat and other protein foods Skinless chicken or [place]. Ground chicken or [place]. Pork with fat trimmed off. Fish and seafood. Egg whites. Dried beans, peas, or lentils. Unsalted nuts, nut butters, and seeds. Unsalted canned beans. Lean cuts of beef with fat trimmed off. Low-sodium, lean deli meat. Dairy Low-fat (1%) or fat-free (skim) milk. Fat-free, low-fat, or reduced-fat cheeses. Nonfat, low-sodium ricotta or cottage cheese. Low-fat or nonfat yogurt. Low-fat, low-sodium cheese. Fats and oils Soft margarine without trans fats. Vegetable oil. Low-fat, reduced-fat, or light mayonnaise and salad dressings (reduced-sodium). Canola, safflower, olive, soybean, and sunflower oils. Avocado. Seasoning and other foods Herbs. Spices. Seasoning mixes without salt. Unsalted popcorn and pretzels. Fat-free sweets. What foods are not recommended? The items listed may not be a complete list. Talk with your dietitian about what dietary choices are best for you. Grains Baked goods made with fat, such as croissants, muffins, or some breads. Dry pasta or rice meal packs. Vegetables Creamed or fried vegetables. Vegetables in a cheese sauce. Regular canned vegetables (not low-sodium or reduced-sodium). Regular canned tomato sauce and paste (not low-sodium or reduced-sodium). Regular tomato and vegetable juice (not low-sodium or reduced-sodium). Angie Fava. Olives. Fruits Canned fruit in a light or heavy syrup. Fried fruit. Fruit in cream or butter sauce. Meat and other protein foods Fatty cuts of meat. Ribs. Fried meat. Berniece Salines. Sausage. Bologna and other processed lunch meats. Salami. Fatback. Hotdogs. Bratwurst. Salted nuts and seeds. Canned beans with added salt. Canned or smoked fish. Whole eggs or egg yolks. Chicken or [place] with skin. Dairy Whole or 2% milk, cream, and half-and-half. Whole or full-fat cream cheese. Whole-fat or sweetened  yogurt. Full-fat cheese. Nondairy creamers. Whipped toppings. Processed cheese and cheese spreads. Fats and oils Butter. Stick margarine. Lard. Shortening. Ghee. Bacon fat. Tropical oils, such as coconut, palm kernel, or palm oil. Seasoning and other foods Salted popcorn and pretzels. Onion salt, garlic salt, seasoned salt, table salt, and sea salt. Worcestershire sauce. Tartar sauce. Barbecue sauce. Teriyaki sauce. Soy sauce, including reduced-sodium. Steak sauce. Canned and packaged gravies. Fish sauce. Oyster sauce. Cocktail sauce. Horseradish that you find on the shelf. Ketchup. Mustard. Meat flavorings and tenderizers. Bouillon cubes. Hot sauce and Tabasco sauce. Premade or packaged marinades. Premade or packaged taco seasonings. Relishes. Regular salad dressings. Where to find more information:  National Heart, Lung, and [place]: https://wilson-eaton.com/  American Heart Association: www.heart.org Summary  The DASH eating plan is a healthy eating plan that has been shown to reduce high blood pressure (hypertension). It may also reduce your risk for type 2 diabetes, heart disease, and stroke.  With the DASH eating plan, you should limit salt (sodium) intake to 2,300 mg a day. If you have hypertension, you may need to reduce your sodium intake to 1,500 mg a day.  When on the DASH eating plan, aim to eat more fresh fruits and vegetables, whole grains, lean proteins, low-fat dairy, and heart-healthy fats.  Work with your health care provider or  diet and nutrition specialist (dietitian) to adjust your eating plan to your individual calorie needs. This information is not intended to replace advice given to you by your health care provider. Make sure you discuss any questions you have with your health care provider. Document Released: 05/01/2011 Document Revised: 05/05/2016 Document Reviewed: 05/05/2016 Elsevier Interactive Patient Education  2019 [employer].

## 2018-07-27 NOTE — Progress Notes (Signed)
Careteam: Patient Care Team: [name], NP as PCP - General (Geriatric Medicine) [name], [place] (Optometry) Despina Hick, MD as Consulting Physician (Ophthalmology)  Advanced Directive information    Allergies  Allergen Reactions  . Oxycodone     Had stomach and headache as side effect from medicine  . Sulfur Diarrhea    Chief Complaint  Patient presents with  . Medical Management of Chronic Issues    3 month follow-up and discuss labs (copy printed). Discuss diagnosis of Ospeopenia(refer to Dexa from 2015), last Dexa 03/2018 was normal.   . Medication Management    Please advise on which medications have an antihistamine in them   . Best Practice Recommendations    Foot Exam Due, discuss need for Hep C screening      HPI: Patient is a 63 y.o. female seen in the office today for routine follow up.   Hyperlipidemia - stable on simvastatin. LDL 86  HTN - stable on maxzide. Takes ASA daily  DM- diet controlled. Checks blood sugar occasionally. Occasional numbness/tingling in hands/feet. This has improved.  Metformin stopped by Endo Dr Dwyane Dee. Does not follow with endocrinology at this time.  A1c 6.3.. Last eye exam in 12/15/2017 by [facility] - no diabetic eye disease. She is on statin  GERD - stable on omeprazole  allergic rhinitis/asthma - following with allergist. On multiple medications for allergies and asthma. Previously followed by pulm Dr Vaughan Browner - PFTs completed and reveals mild asthma, no ongoing follow up needed  Sleep study completed in July 2018 revealed OSA and she is now on CPAP 5-15 auto qhs.  Arthritis - pain stable on prn tylenol daily. Followed by Dr Percell Miller (ortho) in the past no longer seeing.  She had left knee pain and saw Dr Percell Miller who treated left hip bursitis with injection which helped knee- has had bilateral knee replacement and no current pain. Has hx of chronic back pain with injections to lumbar spine. Now  doing well.   Glaucoma - OU. She had laser procedure in March and April. Followed by Dr Arlina Robes at [facility] on [address]. Uses eye gtts, alaway prn. She is s/p OU cats with IOL on Sept and Oct 2018. She now has a glare at night  Obesity - she has fallen off her diet/exercise program due to joint pain. BMI 45.61. had lost 40 lbs but gained it back.   Osteopenia - last DXA in 2015 most recent dexa scan showed normal. Continues on cal and vit d with walking.  Review of Systems:  Review of Systems  Constitutional: Negative for chills, fever, malaise/fatigue and weight loss.  HENT: Negative for tinnitus.   Respiratory: Negative for cough, sputum production and shortness of breath.   Cardiovascular: Positive for leg swelling (mild). Negative for chest pain and palpitations.  Gastrointestinal: Negative for abdominal pain, constipation, diarrhea and heartburn.  Genitourinary: Negative for dysuria, frequency and urgency.  Musculoskeletal: Negative for back pain, falls, joint pain and myalgias.  Skin: Negative.   Neurological: Negative for dizziness and headaches.  Endo/Heme/Allergies: Positive for environmental allergies.  Psychiatric/Behavioral: Negative for depression and memory loss. The patient does not have insomnia.     Past Medical History:  Diagnosis Date  . Allergic rhinitis, cause unspecified   . Arthritis   . Asthma   . Carpal tunnel syndrome   . Cataract   . Diabetes mellitus without complication (HCC)    diet- controlled  . GERD (gastroesophageal reflux  disease)   . Glaucoma   . Hypertension   . Impaired fasting glucose   . Morbid obesity ([place])   . OSA (obstructive sleep apnea) 12/02/2016  . Other malaise and fatigue   . Other specified cardiac dysrhythmias(427.89)   . Pain in joint, site unspecified   . Symptomatic menopausal or female climacteric states    Past Surgical History:  Procedure Laterality Date  . ABDOMINAL HYSTERECTOMY     1994  . carpal tunnel  both hands     2012  . CLOSED MANIPULATION SHOULDER Left 04/2014  . EYE SURGERY Left 08/25/2015  . EYE SURGERY Right 09/24/2015  . JOINT REPLACEMENT     both knees replacement, 2010  . mole removed from face    . Nodule removed from back    . SHOULDER SURGERY Left 11/2013  . TONSILLECTOMY     as teenager   Social History:   reports that she has never smoked. She has never used smokeless tobacco. She reports current alcohol use. She reports that she does not use drugs.  Family History  Problem Relation Age of Onset  . Cancer Father   . Diabetes Father   . Diabetes Brother   . Diabetes Paternal Aunt   . Lung cancer Cousin     Medications: Patient's Medications  New Prescriptions   No medications on file  Previous Medications   ACCU-CHEK AVIVA PLUS TEST STRIP    CHECK BLOOD SUGAR ONCE DAILY AS INSTRUCTED DX E11.9   ACETAMINOPHEN (TYLENOL) 500 MG TABLET    Take 500 mg by mouth daily.   ALBUTEROL (PROVENTIL HFA;VENTOLIN HFA) 108 (90 BASE) MCG/ACT INHALER    Inhale 2 puffs into the lungs every 4 (four) hours as needed.   AMBULATORY NON FORMULARY MEDICATION    Medication Name: Allergy Injection- once weekly   ASCORBIC ACID (VITAMIN C) 100 MG TABLET    Take 1,000 mg by mouth daily.   ASPIRIN 81 MG TABLET    Take 81 mg by mouth daily.   AZELASTINE (ASTELIN) 0.1 % NASAL SPRAY    1-2 SPRAYS IN EACH NOSTRIL TWICE A DAY NASALLY 30 DAYS   B-D ULTRA-FINE 33 LANCETS MISC    1 each by Does not apply route as directed.   BLOOD GLUCOSE MONITORING SUPPL (PRODIGY AUTOCODE BLOOD GLUCOSE) DEVI    by Does not apply route. Daily as directed   CALCIUM CARBONATE-VITAMIN D (CALTRATE 600+D) 600-400 MG-UNIT PER CHEW TABLET    Chew 1 tablet by mouth 2 (two) times daily. Chew one tablet once a day    CHOLECALCIFEROL 25 MCG (1000 UT) TABLET    Take 1,000 Units by mouth daily.   EPIPEN 2-PAK 0.3 MG/0.3ML SOAJ INJECTION       FLOVENT HFA 110 MCG/ACT INHALER    Inhale 2 puffs into the lungs 2 (two) times  daily.   FLUTICASONE (FLONASE) 50 MCG/ACT NASAL SPRAY    Place 1-2 sprays into both nostrils daily.   LEVOCETIRIZINE (XYZAL) 5 MG TABLET    Take 1 tablet (5 mg total) by mouth every evening.   MULTIPLE VITAMIN (MULTIVITAMIN) TABLET    Take 1 tablet by mouth daily.    OMEPRAZOLE (PRILOSEC) 40 MG CAPSULE    Take 1 capsule (40 mg total) by mouth daily.   SIMVASTATIN (ZOCOR) 20 MG TABLET    TAKE ONE TABLET BY MOUTH ONCE DAILY AT BEDTIME FOR CHOLESTEROL   TRIAMTERENE-HYDROCHLOROTHIAZIDE (MAXZIDE) 75-50 MG TABLET    TAKE ONE TABLET BY MOUTH ONCE  DAILY FOR BLOOD PRESSURE   VITAMIN E (VITAMIN E) 400 UNIT CAPSULE    Take 400 Units by mouth daily.  Modified Medications   No medications on file  Discontinued Medications   No medications on file     Physical Exam:  Vitals:   07/27/18 1311  BP: 130/78  Pulse: 66  Temp: 98.4 F (36.9 C)  TempSrc: Oral  SpO2: 98%  Weight: 300 lb (136.1 kg)  Height: 5\' 8"  (1.727 m)   Body mass index is 45.61 kg/m.  Physical Exam Constitutional:      Appearance: Normal appearance. She is well-developed.  HENT:     Mouth/Throat:     Pharynx: No oropharyngeal exudate.  Eyes:     General: No scleral icterus.    Pupils: Pupils are equal, round, and reactive to light.  Neck:     Musculoskeletal: Neck supple.     Thyroid: No thyromegaly.     Vascular: No carotid bruit.     Trachea: No tracheal deviation.  Cardiovascular:     Rate and Rhythm: Normal rate and regular rhythm.     Heart sounds: Murmur (1/6 SEM) present. No friction rub. No gallop.      Comments: +1 pitting LE edema b/l; no calf TTP Pulmonary:     Effort: Pulmonary effort is normal. No respiratory distress.     Breath sounds: Normal breath sounds. No stridor. No wheezing or rales.  Abdominal:     General: Bowel sounds are normal.     Palpations: Abdomen is soft. Abdomen is not rigid. There is no hepatomegaly.  Lymphadenopathy:     Cervical: No cervical adenopathy.  Skin:    General:  Skin is warm and dry.     Findings: No rash.  Neurological:     Mental Status: She is alert and oriented to person, place, and time.     Deep Tendon Reflexes: Reflexes are normal and symmetric.  Psychiatric:        Behavior: Behavior normal.        Thought Content: Thought content normal.        Judgment: Judgment normal.     Labs reviewed: Basic Metabolic Panel: Recent Labs    11/13/17 1025 03/19/18 1040 07/23/18 1045  NA 142 142 142  K 3.9 3.7 3.6  CL 102 103 103  CO2 32 29 30  GLUCOSE 90 102* 99  BUN 19 11 12   CREATININE 0.83 0.81 0.70  CALCIUM 9.7 9.6 9.9  TSH 3.14  --   --    Liver Function Tests: Recent Labs    11/13/17 1025 03/19/18 1040 07/23/18 1045  AST 14 14 17   ALT 11 13 14   BILITOT 0.5 0.5 0.5  PROT 7.1 6.7 7.1   No results for input(s): LIPASE, AMYLASE in the last 8760 hours. No results for input(s): AMMONIA in the last 8760 hours. CBC: Recent Labs    11/13/17 1025  WBC 9.4  NEUTROABS 5,922  HGB 12.9  HCT 38.7  MCV 80.6  PLT 371   Lipid Panel: Recent Labs    11/13/17 1025 03/19/18 1040 07/23/18 1045  CHOL 160 151 155  HDL 53 51 52  LDLCALC 90 80 86  TRIG 82 107 83  CHOLHDL 3.0 3.0 3.0   TSH: Recent Labs    11/13/17 1025  TSH 3.14   A1C: Lab Results  Component Value Date   HGBA1C 6.3 (H) 07/23/2018     Assessment/Plan 1. Type 2 diabetes mellitus without complication, without  long-term current use of insulin (HCC) -diet controlled. Up to date on eye exam. A1c at goal. -will add ACEi for renal protection - lisinopril (PRINIVIL,ZESTRIL) 5 MG tablet; Take 1 tablet (5 mg total) by mouth daily.  Dispense: 30 tablet; Refill: 0  2. Essential hypertension, benign Controlled. Continues on maxide, adding ACEi due to DM. - lisinopril (PRINIVIL,ZESTRIL) 5 MG tablet; Take 1 tablet (5 mg total) by mouth daily.  Dispense: 30 tablet; Refill: 0  3. Morbid obesity ([place]) -discussed weight loss with exercise and diet - Amb Ref to  Medical Weight Management for further managment  4. Hyperlipidemia LDL goal <70 - LDL 86, continues dietary modification with Statin  5. Gastroesophageal reflux disease, esophagitis presence not specified Stable on omeprazole.   6. Generalized osteoarthrosis, involving multiple sites Controlled on tylenol.   7. Osteopenia, unspecified location Stable recent dexa shows normal bone density. Continue cal and vit D with weight bearing activity.   8. OSA (obstructive sleep apnea) -continues on CPAP   Next appt: 3 weeks for blood pressure follow up and BMP due to adding lisinopril.  Carlos American. [place], [facility] [phone]

## 2018-07-28 LAB — MICROALBUMIN / CREATININE URINE RATIO
CREATININE, URINE: 54 mg/dL (ref 20–275)
MICROALB/CREAT RATIO: 6 ug/mg{creat} (ref <30)
Microalb, Ur: 0.3 mg/dL

## 2018-07-29 ENCOUNTER — Ambulatory Visit (INDEPENDENT_AMBULATORY_CARE_PROVIDER_SITE_OTHER): Payer: Medicare Other | Admitting: Allergy & Immunology

## 2018-07-29 ENCOUNTER — Encounter: Payer: Self-pay | Admitting: Allergy & Immunology

## 2018-07-29 VITALS — BP 118/82 | HR 72 | Temp 98.4°F | Resp 18 | Ht 68.5 in | Wt 301.4 lb

## 2018-07-29 DIAGNOSIS — J4540 Moderate persistent asthma, uncomplicated: Secondary | ICD-10-CM

## 2018-07-29 DIAGNOSIS — J3089 Other allergic rhinitis: Secondary | ICD-10-CM

## 2018-07-29 MED ORDER — BUDESONIDE-FORMOTEROL FUMARATE 160-4.5 MCG/ACT IN AERO: 2.0000 | INHALATION_SPRAY | Freq: Two times a day (BID) | RESPIRATORY_TRACT | 5 refills | Status: DC

## 2018-07-29 NOTE — Patient Instructions (Addendum)
1. Moderate persistent asthma, uncomplicated -Lung testing was in the 50% range, but it did improve with the nebulizer treatment. -Because of this, I would like to change you from Flovent to Symbicort, which contains a long-acting albuterol combined with an inhaled steroid. - Spacer use reviewed. - Daily controller medication(s): Symbicort 160/4.38mcg two puffs twice daily with spacer - Prior to physical activity: albuterol 2 puffs 10-15 minutes before physical activity. - Rescue medications: albuterol 4 puffs every 4-6 hours as needed - Asthma control goals:  * Full participation in all desired activities (may need albuterol before activity) * Albuterol use two time or less a week on average (not counting use with activity) * Cough interfering with sleep two time or less a month * Oral steroids no more than once a year * No hospitalizations  2. Perennial allergic rhinitis (dust mites, cockroach) -We will work on getting your vials. -We will obtain some lab work to see where your allergy levels are hanging out. -Based on the lab work as well as her most recent skin testing from 2017, we can mix her own vials here. -Immunotherapy consent signed today. -Call us next week and check on whether or not we have received her vials. -Continue with cetirizine 10 mg daily. -Continue with Flonase 1 to 2 sprays per nostril up to twice daily. -Continue with Astelin 1 to 2 sprays per nostril up to twice daily.  3. Return in about 3 months (around 10/29/2018).   Please inform us of any Emergency Department visits, hospitalizations, or changes in symptoms. Call us before going to the ED for breathing or allergy symptoms since we might be able to fit you in for a sick visit. Feel free to contact us anytime with any questions, problems, or concerns.  It was a pleasure to meet you today!  Websites that have reliable patient information: 1. American Academy of Asthma, Allergy, and Immunology:  www.aaaai.org 2. Food Allergy Research and Education (FARE): foodallergy.org 3. Mothers of Asthmatics: http://www.asthmacommunitynetwork.org 4. American College of Allergy, Asthma, and Immunology: MonthlyElectricBill.co.uk   Make sure you are registered to vote! If you have moved or changed any of your contact information, you will need to get this updated before voting!    Voter ID laws are NOT going into effect for the General Election in November 2020! DO NOT let this stop you from exercising your right to vote!

## 2018-07-29 NOTE — Progress Notes (Signed)
NEW PATIENT  Date of Service/Encounter:  07/29/18  Referring provider: [name], NP   Assessment:   Moderate persistent asthma - with terrible spirometry today, but poor effort  Perennial allergic rhinitis (dust mites, cockroach) - labs pending  Complex medical history   Asthma Reportables:  Severity: moderate persistent  Risk: high Control: not well controlled   Plan/Recommendations:   1. Moderate persistent asthma, uncomplicated - Lung testing was in the 50% range, but it did improve with the nebulizer treatment. Her spirometry has been completely normal in the past, although the most recent one that we have from her. - AEC was 226 in June 2019, so we could get her approved for [name] Bue if needed.  - Because of this, I would like to change you from Flovent to Symbicort, which contains a long-acting albuterol combined with an inhaled steroid. - Spacer use reviewed. - Daily controller medication(s): Symbicort 160/4.56mcg two puffs twice daily with spacer - Prior to physical activity: albuterol 2 puffs 10-15 minutes before physical activity. - Rescue medications: albuterol 4 puffs every 4-6 hours as needed - Asthma control goals:  * Full participation in all desired activities (may need albuterol before activity) * Albuterol use two time or less a week on average (not counting use with activity) * Cough interfering with sleep two time or less a month * Oral steroids no more than once a year * No hospitalizations  2. Perennial allergic rhinitis (dust mites, cockroach) - We will work on getting your vials. - We will obtain some lab work to see where your allergy levels are hanging out. - Based on the lab work as well as her most recent skin testing from 2017, we can mix her own vials here. - Immunotherapy consent signed today. - Call us next week and check on whether or not we have received her vials. - Continue with cetirizine 10 mg daily. - Continue with  Flonase 1 to 2 sprays per nostril up to twice daily. - Continue with Astelin 1 to 2 sprays per nostril up to twice daily.  3. Return in about 3 months (around 10/29/2018).  Subjective:   Cheryl [name] is a 63 y.o. female presenting today for evaluation of  Chief Complaint  Patient presents with  . Allergic Rhinitis   . Asthma    Cheryl [name] has a history of the following: Patient Active Problem List   Diagnosis Date Noted  . OSA (obstructive sleep apnea) 12/02/2016  . Hyperlipidemia LDL goal <100 10/21/2014  . Essential hypertension, benign 10/21/2014  . DM w/o complication type II ([place]) 09/13/2014  . Routine general medical examination at a health care facility 10/12/2013  . 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 05/03/2013  . Need for prophylactic vaccination and inoculation against influenza 02/08/2013  . GERD (gastroesophageal reflux disease) 02/08/2013  . Other and unspecified hyperlipidemia 11/10/2012  . Type II or unspecified type diabetes mellitus without mention of complication, not stated as uncontrolled 10/25/2012  . Impaired fasting glucose 09/07/2012  . Morbid obesity ([place]) 09/07/2012  . Generalized osteoarthrosis, involving multiple sites 09/07/2012  . Carpal tunnel syndrome 09/07/2012  . Other, multiple, and unspecified sites, insect bite, nonvenomous, without mention of infection(919.4) 09/07/2012  . Special screening for malignant neoplasms, colon 09/07/2012  . Other specified cardiac dysrhythmias(427.89) 09/07/2012  . Candidiasis of vulva and vagina 09/07/2012  . Abdominal pain, generalized 09/07/2012  . Symptomatic menopausal or female climacteric states 09/07/2012  . Essential hypertension 09/07/2012  . Allergic  rhinitis 09/07/2012  . Pain in joint, site unspecified 09/07/2012  . Other malaise and fatigue 09/07/2012    History obtained from: chart review and patient and mother.  [name] was referred by [name], NP.     Cheryl [name] is a 63  y.o. female presenting for establishment of care.   Asthma/Respiratory Symptom History: She has never been diagnosed with asthma. But she does see Dr. Rolla Etienne. She sees him for a "spot" on her lungs. 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 She does have Flovent 123mcg two puffs BID. She does use a spacer. She also has Proventil which she does not use often. She has been on Flovent for a long time.  Review of her note shows that she has been on Qvar at some point as well.  Allergic Rhinitis Symptom History: She remains on Xyal one tablet once daily. She is on Flonase and Astelin nasal sprays. She is on allergen immunotherapy since 2006. They have helped quite a bit. She is worried about stopping these medications since she still has "bad days". She has been on weekly allergen injections for 14 years now.  She has never been spaced out to more than every week unless she forgot a visit or was on vacation.  She has never had any reactions to her allergy injections.  Rev iew of her notes from the other allergist show that she last had testing in September 2017.  It was positive to dust mites and cockroach on intradermal testing only.  Her initial skin testing was done in June 2006 and was positive to dog, weeds, dust mite, cockroach, and multiple mold mixes.  Review of her notes showed that she tended to get Depo-Medrol 120 mg with prednisone weans at nearly every visit.  She has had normal spirometry is in the past, as recently as 2017.  Otherwise, there is no history of other atopic diseases, including food allergies, drug allergies, stinging insect allergies, eczema, urticaria or contact dermatitis. There is no significant infectious history. Vaccinations are up to date.    Past Medical History: Patient Active Problem List   Diagnosis Date Noted  . OSA (obstructive sleep apnea) 12/02/2016  . Hyperlipidemia LDL goal <100  10/21/2014  . Essential hypertension, benign 10/21/2014  . DM w/o complication type II ([place]) 09/13/2014  . Routine general medical examination at a health care facility 10/12/2013  . 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 05/03/2013  . Need for prophylactic vaccination and inoculation against influenza 02/08/2013  . GERD (gastroesophageal reflux disease) 02/08/2013  . Other and unspecified hyperlipidemia 11/10/2012  . Type II or unspecified type diabetes mellitus without mention of complication, not stated as uncontrolled 10/25/2012  . Impaired fasting glucose 09/07/2012  . Morbid obesity ([place]) 09/07/2012  . Generalized osteoarthrosis, involving multiple sites 09/07/2012  . Carpal tunnel syndrome 09/07/2012  . Other, multiple, and unspecified sites, insect bite, nonvenomous, without mention of infection(919.4) 09/07/2012  . Special screening for malignant neoplasms, colon 09/07/2012  . Other specified cardiac dysrhythmias(427.89) 09/07/2012  . Candidiasis of vulva and vagina 09/07/2012  . Abdominal pain, generalized 09/07/2012  . Symptomatic menopausal or female climacteric states 09/07/2012  . Essential hypertension 09/07/2012  . Allergic rhinitis 09/07/2012  . Pain in joint, site unspecified 09/07/2012  . Other malaise and fatigue 09/07/2012    Medication List:  Allergies as of 07/29/2018      Reactions  Oxycodone    Had stomach and headache as side effect from medicine   Sulfur Diarrhea      Medication List       Accurate as of July 29, 2018 11:59 PM. Always use your most recent med list.        Accu-Chek Aviva Plus test strip Generic drug:  glucose blood CHECK BLOOD SUGAR ONCE DAILY AS INSTRUCTED DX E11.9   acetaminophen 500 MG tablet Commonly known as:  TYLENOL Take 500 mg by mouth daily.   albuterol 108 (90 Base) MCG/ACT inhaler Commonly known as:  PROVENTIL HFA;VENTOLIN HFA Inhale 2 puffs into the lungs every 4 (four) hours as needed.   AMBULATORY NON  FORMULARY MEDICATION Medication Name: Allergy Injection- once weekly   aspirin 81 MG tablet Take 81 mg by mouth daily.   azelastine 0.1 % nasal spray Commonly known as:  ASTELIN 1-2 SPRAYS IN EACH NOSTRIL TWICE A DAY NASALLY 30 DAYS   B-D ULTRA-FINE 33 LANCETS Misc 1 each by Does not apply route as directed.   budesonide-formoterol 160-4.5 MCG/ACT inhaler Commonly known as:  Symbicort Inhale 2 puffs into the lungs 2 (two) times daily.   Caltrate 600+D 600-400 MG-UNIT chew tablet Generic drug:  Calcium Carbonate-Vitamin D Chew 1 tablet by mouth 2 (two) times daily. Chew one tablet once a day   Cholecalciferol 25 MCG (1000 UT) tablet Take 1,000 Units by mouth daily.   EpiPen 2-Pak 0.3 mg/0.3 mL Soaj injection Generic drug:  EPINEPHrine   Flovent HFA 110 MCG/ACT inhaler Generic drug:  fluticasone Inhale 2 puffs into the lungs 2 (two) times daily.   fluticasone 50 MCG/ACT nasal spray Commonly known as:  FLONASE Place 1-2 sprays into both nostrils daily.   levocetirizine 5 MG tablet Commonly known as:  XYZAL Take 1 tablet (5 mg total) by mouth every evening.   lisinopril 5 MG tablet Commonly known as:  PRINIVIL,ZESTRIL Take 1 tablet (5 mg total) by mouth daily.   multivitamin tablet Take 1 tablet by mouth daily.   omeprazole 40 MG capsule Commonly known as:  PRILOSEC Take 1 capsule (40 mg total) by mouth daily.   Prodigy Autocode Blood Glucose Devi by Does not apply route. Daily as directed   simvastatin 20 MG tablet Commonly known as:  ZOCOR TAKE ONE TABLET BY MOUTH ONCE DAILY AT BEDTIME FOR CHOLESTEROL   triamterene-hydrochlorothiazide 75-50 MG tablet Commonly known as:  MAXZIDE TAKE ONE TABLET BY MOUTH ONCE DAILY FOR BLOOD PRESSURE   vitamin C 100 MG tablet Take 1,000 mg by mouth daily.   vitamin E 400 UNIT capsule Generic drug:  vitamin E Take 400 Units by mouth daily.       Birth History: non-contributory  Developmental History:  non-contributory.   Past Surgical History: Past Surgical History:  Procedure Laterality Date  . ABDOMINAL HYSTERECTOMY     1994  . carpal tunnel both hands     2012  . CLOSED MANIPULATION SHOULDER Left 04/2014  . EYE SURGERY Left 08/25/2015  . EYE SURGERY Right 09/24/2015  . JOINT REPLACEMENT     both knees replacement, 2010  . mole removed from face    . Nodule removed from back    . SHOULDER SURGERY Left 11/2013  . TONSILLECTOMY     as teenager     Family History: Family History  Problem Relation Age of Onset  . Cancer Father   . Diabetes Father   . Allergic rhinitis Son   . Diabetes Brother   .  Allergic rhinitis Son   . Diabetes Paternal Aunt   . Lung cancer Cousin   . Asthma Neg Hx      Social History: Shaylin lives at home with her family.  She lives in a house that is 65 years old.  There is carpeting and tile throughout the home.  She has electric heating and central cooling.  There are no animals inside or outside of the home.  There are no dust mite covers.  There is no tobacco exposure.  She is retired from the past 6 years.  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   Review of Systems  Constitutional: Negative.  Negative for fever, malaise/fatigue and weight loss.  HENT: Positive for congestion, sinus pain and sore throat. Negative for ear discharge and ear pain.   Eyes: Negative for pain, discharge and redness.  Respiratory: Positive for cough. Negative for sputum production, shortness of breath and wheezing.   Cardiovascular: Negative.  Negative for chest pain and palpitations.  Gastrointestinal: Negative for abdominal pain and heartburn.  Skin: Negative.  Negative for itching and rash.  Neurological: Negative for dizziness and headaches.  Endo/Heme/Allergies: Negative for environmental allergies. Does not bruise/bleed easily.       Objective:   Blood pressure 118/82, pulse 72, temperature 98.4 F (36.9 C), temperature source Oral, resp. rate 18,  height 5' 8.5" (1.74 m), weight (!) 301 lb 6.4 oz (136.7 kg), SpO2 97 %. Body mass index is 45.16 kg/m.   Physical Exam:   Physical Exam  Constitutional: Vital signs are normal.  Pleasant obese female.  HENT:  Head: Normocephalic and atraumatic.  Right Ear: Tympanic membrane, external ear and ear canal normal. No drainage, swelling or tenderness. Tympanic membrane is not injected, not scarred, not erythematous, not retracted and not bulging.  Left Ear: Tympanic membrane, external ear and ear canal normal. No drainage, swelling or tenderness. Tympanic membrane is not injected, not scarred, not erythematous, not retracted and not bulging.  Nose: Mucosal edema and rhinorrhea present. No nasal deformity or septal deviation. No epistaxis. Right sinus exhibits no maxillary sinus tenderness and no frontal sinus tenderness. Left sinus exhibits no maxillary sinus tenderness and no frontal sinus tenderness.  Mouth/Throat: Uvula is midline and oropharynx is clear and moist. Mucous membranes are not pale and not dry.  Postnasal drip present.  Tympanic membranes are normal.  Eyes: Pupils are equal, round, and reactive to light. Conjunctivae and EOM are normal. Right eye exhibits no chemosis and no discharge. Left eye exhibits no chemosis and no discharge. Right conjunctiva is not injected. Left conjunctiva is not injected.  Cardiovascular: Normal rate, regular rhythm and normal heart sounds.  Respiratory: Effort normal and breath sounds normal. No accessory muscle usage. No tachypnea. No respiratory distress. She has no wheezes. She has no rhonchi. She has no rales. She exhibits no tenderness.  Moving air in all lung fields.  GI: There is no abdominal tenderness. There is no rebound and no guarding.  Lymphadenopathy:       Head (right side): No submandibular, no tonsillar and no occipital adenopathy present.       Head (left side): No submandibular, no tonsillar and no occipital adenopathy present.     She has no cervical adenopathy.  Neurological: She is alert.  Skin: No abrasion, no petechiae and no rash noted. Rash is not papular, not vesicular and not urticarial. No erythema. No pallor.  Psychiatric: She has a normal mood and affect.  Diagnostic studies:    Spirometry: results abnormal (FEV1: 0.85/35%, FVC: 1.41/45%, FEV1/FVC: 60%).    Spirometry consistent with mixed obstructive and restrictive disease. Xopenex/Atrovent nebulizer treatment given in clinic with no improvement in the FEV1 or FVC, but there was a 56% improvement in the FEF 25-75%.  Overall, there was very poor technique.  This was despite multiple attempts.  Allergy Studies: none     Salvatore Marvel, MD [facility]

## 2018-07-30 ENCOUNTER — Encounter: Payer: Self-pay | Admitting: Allergy & Immunology

## 2018-08-01 LAB — CBC WITH DIFFERENTIAL/PLATELET
BASOS ABS: 0 10*3/uL (ref 0.0–0.2)
Basos: 1 %
EOS (ABSOLUTE): 0.2 10*3/uL (ref 0.0–0.4)
Eos: 3 %
Hematocrit: 39.9 % (ref 34.0–46.6)
Hemoglobin: 13.6 g/dL (ref 11.1–15.9)
IMMATURE GRANULOCYTES: 1 %
Immature Grans (Abs): 0 10*3/uL (ref 0.0–0.1)
Lymphocytes Absolute: 2.3 10*3/uL (ref 0.7–3.1)
Lymphs: 27 %
MCH: 27.8 pg (ref 26.6–33.0)
MCHC: 34.1 g/dL (ref 31.5–35.7)
MCV: 82 fL (ref 79–97)
MONOCYTES: 9 %
MONOS ABS: 0.8 10*3/uL (ref 0.1–0.9)
NEUTROS PCT: 59 %
Neutrophils Absolute: 5.3 10*3/uL (ref 1.4–7.0)
Platelets: 398 10*3/uL (ref 150–450)
RBC: 4.89 x10E6/uL (ref 3.77–5.28)
RDW: 14.7 % (ref 11.7–15.4)
WBC: 8.7 10*3/uL (ref 3.4–10.8)

## 2018-08-01 LAB — IGE+ALLERGENS ZONE 2(30)
Alternaria Alternata IgE: <0.1 kU/L
Amer Sycamore IgE Qn: <0.1 kU/L
Aspergillus Fumigatus IgE: <0.1 kU/L
Cat Dander IgE: <0.1 kU/L
Cedar, Mountain IgE: <0.1 kU/L
Cladosporium Herbarum IgE: <0.1 kU/L
Common Silver Birch IgE: <0.1 kU/L
D Farinae IgE: <0.1 kU/L
Dog Dander IgE: <0.1 kU/L
Elm, American IgE: <0.1 kU/L
IGE (IMMUNOGLOBULIN E), SERUM: 23 [IU]/mL (ref 6–495)
Mucor Racemosus IgE: <0.1 kU/L
Mugwort IgE Qn: <0.1 kU/L
Oak, White IgE: <0.1 kU/L
Pigweed, Rough IgE: <0.1 kU/L
Ragweed, Short IgE: 0.13 kU/L — AB
Stemphylium Herbarum IgE: <0.1 kU/L
Sweet gum IgE RAST Ql: <0.1 kU/L
Timothy Grass IgE: <0.1 kU/L
White Mulberry IgE: <0.1 kU/L

## 2018-08-02 ENCOUNTER — Telehealth: Payer: Self-pay

## 2018-08-02 NOTE — Telephone Encounter (Signed)
Called to inform patient that her allergy vials arrived today and to call office to set up appt to start receiving injections.

## 2018-08-03 NOTE — Telephone Encounter (Signed)
Spoke with patient this morning to discuss lab results.  We also set up an appointment for her to start injections tomorrow.

## 2018-08-04 ENCOUNTER — Ambulatory Visit (INDEPENDENT_AMBULATORY_CARE_PROVIDER_SITE_OTHER): Payer: Medicare Other | Admitting: *Deleted

## 2018-08-04 ENCOUNTER — Ambulatory Visit: Payer: Medicare Other

## 2018-08-04 DIAGNOSIS — J309 Allergic rhinitis, unspecified: Secondary | ICD-10-CM

## 2018-08-11 ENCOUNTER — Other Ambulatory Visit: Payer: Self-pay

## 2018-08-11 ENCOUNTER — Ambulatory Visit (INDEPENDENT_AMBULATORY_CARE_PROVIDER_SITE_OTHER): Payer: Medicare Other

## 2018-08-11 DIAGNOSIS — J309 Allergic rhinitis, unspecified: Secondary | ICD-10-CM

## 2018-08-17 ENCOUNTER — Other Ambulatory Visit: Payer: Self-pay

## 2018-08-17 ENCOUNTER — Ambulatory Visit (INDEPENDENT_AMBULATORY_CARE_PROVIDER_SITE_OTHER): Payer: Medicare Other | Admitting: Nurse Practitioner

## 2018-08-17 ENCOUNTER — Encounter: Payer: Self-pay | Admitting: Nurse Practitioner

## 2018-08-17 ENCOUNTER — Ambulatory Visit: Payer: Medicare Other | Admitting: Nurse Practitioner

## 2018-08-17 VITALS — BP 124/86 | HR 61 | Temp 98.1°F | Ht 68.0 in | Wt 298.2 lb

## 2018-08-17 DIAGNOSIS — I10 Essential (primary) hypertension: Secondary | ICD-10-CM | POA: Diagnosis not present

## 2018-08-17 DIAGNOSIS — R0609 Other forms of dyspnea: Secondary | ICD-10-CM

## 2018-08-17 DIAGNOSIS — E119 Type 2 diabetes mellitus without complications: Secondary | ICD-10-CM | POA: Diagnosis not present

## 2018-08-17 NOTE — Progress Notes (Signed)
Careteam: Patient Care Team: [name], NP as PCP - General (Geriatric Medicine) [name], [place] (Optometry) Despina Hick, MD as Consulting Physician (Ophthalmology) Ernst Bowler Gwenith Daily, MD as Consulting Physician (Allergy and Immunology)  Advanced Directive information Does Patient Have a Medical Advance Directive?: Yes, Type of Advance Directive: [place], Does patient want to make changes to medical advance directive?: No - Patient declined  Allergies  Allergen Reactions  . Oxycodone     Had stomach and headache as side effect from medicine  . Sulfur Diarrhea    Chief Complaint  Patient presents with  . Medical Management of Chronic Issues    3 Week follow up and lab work     HPI: Patient is a 63 y.o. female seen in the office today for follow up on blood pressure. Pt was started on low dose lisinopril for renal protection due to being diabetic.   Reports chronic shortness of breath and following with Dr Ernst Bowler for allergies and Dr Annamaria Boots pulmonary due to this. states she gets out of breath when she is cold and walking. 1 episode.   Noticed that her mouth gets dry and she will have a cough at night.  No cough during the day.  Contributes it to her CPAP.   Review of Systems:  Review of Systems  Constitutional: Negative for chills, fever, malaise/fatigue and weight loss.  HENT: Negative for tinnitus.   Respiratory: Positive for shortness of breath (occasionally with exertion ). Negative for cough and sputum production.   Cardiovascular: Positive for leg swelling (mild). Negative for chest pain and palpitations.  Gastrointestinal: Negative for abdominal pain, constipation, diarrhea and heartburn.  Genitourinary: Negative for dysuria, frequency and urgency.  Musculoskeletal: Negative for back pain, falls, joint pain and myalgias.  Skin: Negative.   Neurological: Negative for dizziness and headaches.  Endo/Heme/Allergies: Positive  for environmental allergies.  Psychiatric/Behavioral: Negative for depression and memory loss. The patient does not have insomnia.    Past Medical History:  Diagnosis Date  . Allergic rhinitis, cause unspecified   . Arthritis   . Asthma   . Carpal tunnel syndrome   . Cataract   . Diabetes mellitus without complication (HCC)    diet- controlled  . GERD (gastroesophageal reflux disease)   . Glaucoma   . Hypertension   . Impaired fasting glucose   . Morbid obesity ([place])   . OSA (obstructive sleep apnea) 12/02/2016  . Other malaise and fatigue   . Other specified cardiac dysrhythmias(427.89)   . Pain in joint, site unspecified   . Symptomatic menopausal or female climacteric states    Past Surgical History:  Procedure Laterality Date  . ABDOMINAL HYSTERECTOMY     1994  . carpal tunnel both hands     2012  . CLOSED MANIPULATION SHOULDER Left 04/2014  . EYE SURGERY Left 08/25/2015  . EYE SURGERY Right 09/24/2015  . JOINT REPLACEMENT     both knees replacement, 2010  . mole removed from face    . Nodule removed from back    . SHOULDER SURGERY Left 11/2013  . TONSILLECTOMY     as teenager   Social History:   reports that she has never smoked. She has never used smokeless tobacco. She reports current alcohol use. She reports that she does not use drugs.  Family History  Problem Relation Age of Onset  . Cancer Father   . Diabetes Father   . Allergic rhinitis Son   . Diabetes Brother   .  Allergic rhinitis Son   . Diabetes Paternal Aunt   . Lung cancer Cousin   . Asthma Neg Hx     Medications: Patient's Medications  New Prescriptions   No medications on file  Previous Medications   ACCU-CHEK AVIVA PLUS TEST STRIP    CHECK BLOOD SUGAR ONCE DAILY AS INSTRUCTED DX E11.9   ACETAMINOPHEN (TYLENOL) 500 MG TABLET    Take 500 mg by mouth daily.   ALBUTEROL (PROVENTIL HFA;VENTOLIN HFA) 108 (90 BASE) MCG/ACT INHALER    Inhale 2 puffs into the lungs every 4 (four) hours as  needed.   AMBULATORY NON FORMULARY MEDICATION    Medication Name: Allergy Injection- once weekly   ASCORBIC ACID (VITAMIN C) 100 MG TABLET    Take 1,000 mg by mouth daily.   ASPIRIN 81 MG TABLET    Take 81 mg by mouth daily.   AZELASTINE (ASTELIN) 0.1 % NASAL SPRAY    1-2 SPRAYS IN EACH NOSTRIL TWICE A DAY NASALLY 30 DAYS   B-D ULTRA-FINE 33 LANCETS MISC    1 each by Does not apply route as directed.   BLOOD GLUCOSE MONITORING SUPPL (PRODIGY AUTOCODE BLOOD GLUCOSE) DEVI    by Does not apply route. Daily as directed   BUDESONIDE-FORMOTEROL (SYMBICORT) 160-4.5 MCG/ACT INHALER    Inhale 2 puffs into the lungs 2 (two) times daily.   CALCIUM CARBONATE-VITAMIN D (CALTRATE 600+D) 600-400 MG-UNIT PER CHEW TABLET    Chew 1 tablet by mouth 2 (two) times daily. Chew one tablet once a day    CHOLECALCIFEROL 25 MCG (1000 UT) TABLET    Take 1,000 Units by mouth daily.   EPIPEN 2-PAK 0.3 MG/0.3ML SOAJ INJECTION       FLUTICASONE (FLONASE) 50 MCG/ACT NASAL SPRAY    Place 1-2 sprays into both nostrils daily.   LEVOCETIRIZINE (XYZAL) 5 MG TABLET    Take 1 tablet (5 mg total) by mouth every evening.   LISINOPRIL (PRINIVIL,ZESTRIL) 5 MG TABLET    Take 1 tablet (5 mg total) by mouth daily.   MULTIPLE VITAMIN (MULTIVITAMIN) TABLET    Take 1 tablet by mouth daily.    OMEPRAZOLE (PRILOSEC) 40 MG CAPSULE    Take 1 capsule (40 mg total) by mouth daily.   SIMVASTATIN (ZOCOR) 20 MG TABLET    TAKE ONE TABLET BY MOUTH ONCE DAILY AT BEDTIME FOR CHOLESTEROL   TRIAMTERENE-HYDROCHLOROTHIAZIDE (MAXZIDE) 75-50 MG TABLET    TAKE ONE TABLET BY MOUTH ONCE DAILY FOR BLOOD PRESSURE   VITAMIN E (VITAMIN E) 400 UNIT CAPSULE    Take 400 Units by mouth daily.  Modified Medications   No medications on file  Discontinued Medications   FLOVENT HFA 110 MCG/ACT INHALER    Inhale 2 puffs into the lungs 2 (two) times daily.     Physical Exam:  Vitals:   08/17/18 0914  BP: 124/86  Pulse: 61  Temp: 98.1 F (36.7 C)  TempSrc: Oral   SpO2: 97%  Weight: 298 lb 3.2 oz (135.3 kg)  Height: 5\' 8"  (1.727 m)   Body mass index is 45.34 kg/m.  Physical Exam Constitutional:      Appearance: Normal appearance. She is well-developed.  HENT:     Mouth/Throat:     Pharynx: No oropharyngeal exudate.  Eyes:     General: No scleral icterus.    Pupils: Pupils are equal, round, and reactive to light.  Neck:     Musculoskeletal: Neck supple.     Thyroid: No thyromegaly.  Vascular: No carotid bruit.     Trachea: No tracheal deviation.  Cardiovascular:     Rate and Rhythm: Normal rate and regular rhythm.     Heart sounds: Murmur (1/6 SEM) present. No friction rub. No gallop.      Comments: +1 pitting LE edema b/l; no calf TTP Pulmonary:     Effort: Pulmonary effort is normal. No respiratory distress.     Breath sounds: Normal breath sounds. No stridor. No wheezing or rales.  Abdominal:     Palpations: Abdomen is not rigid. There is no hepatomegaly.  Lymphadenopathy:     Cervical: No cervical adenopathy.  Skin:    General: Skin is warm and dry.     Findings: No rash.  Neurological:     Mental Status: She is alert and oriented to person, place, and time.     Deep Tendon Reflexes: Reflexes are normal and symmetric.  Psychiatric:        Behavior: Behavior normal.        Thought Content: Thought content normal.        Judgment: Judgment normal.     Labs reviewed: Basic Metabolic Panel: Recent Labs    11/13/17 1025 03/19/18 1040 07/23/18 1045  NA 142 142 142  K 3.9 3.7 3.6  CL 102 103 103  CO2 32 29 30  GLUCOSE 90 102* 99  BUN 19 11 12   CREATININE 0.83 0.81 0.70  CALCIUM 9.7 9.6 9.9  TSH 3.14  --   --    Liver Function Tests: Recent Labs    11/13/17 1025 03/19/18 1040 07/23/18 1045  AST 14 14 17   ALT 11 13 14   BILITOT 0.5 0.5 0.5  PROT 7.1 6.7 7.1   No results for input(s): LIPASE, AMYLASE in the last 8760 hours. No results for input(s): AMMONIA in the last 8760 hours. CBC: Recent Labs     11/13/17 1025 07/29/18 1443  WBC 9.4 8.7  NEUTROABS 5,922 5.3  HGB 12.9 13.6  HCT 38.7 39.9  MCV 80.6 82  PLT 371 398   Lipid Panel: Recent Labs    11/13/17 1025 03/19/18 1040 07/23/18 1045  CHOL 160 151 155  HDL 53 51 52  LDLCALC 90 80 86  TRIG 82 107 83  CHOLHDL 3.0 3.0 3.0   TSH: Recent Labs    11/13/17 1025  TSH 3.14   A1C: Lab Results  Component Value Date   HGBA1C 6.3 (H) 07/23/2018     Assessment/Plan 1. Type 2 diabetes mellitus without complication, without long-term current use of insulin (HCC) -stable, A1c at goal.  Continues on lisinopril for renal protection - BASIC METABOLIC PANEL WITH GFR  2. Essential hypertension, benign -stable. lisinopril added for renal protection  - BASIC METABOLIC PANEL WITH GFR  3. DOE (dyspnea on exertion) Ongoing, stable. following with pulmonary and allergist, no worsening of symptoms.  Next appt: 3 month for routine follow up and AWV.  Carlos American. [place], [facility] [phone]

## 2018-08-17 NOTE — Patient Instructions (Signed)
Follow up in 3 months (with AWV) for routine follow up

## 2018-08-18 ENCOUNTER — Ambulatory Visit (INDEPENDENT_AMBULATORY_CARE_PROVIDER_SITE_OTHER): Payer: Medicare Other

## 2018-08-18 DIAGNOSIS — J309 Allergic rhinitis, unspecified: Secondary | ICD-10-CM

## 2018-08-18 LAB — BASIC METABOLIC PANEL WITH GFR
BUN: 9 mg/dL (ref 7–25)
CO2: 31 mmol/L (ref 20–32)
Calcium: 9.9 mg/dL (ref 8.6–10.4)
Chloride: 103 mmol/L (ref 98–110)
Creat: 0.83 mg/dL (ref 0.50–0.99)
GFR, EST AFRICAN AMERICAN: 86 mL/min/{1.73_m2} (ref >=60)
GFR, Est Non African American: 75 mL/min/{1.73_m2} (ref >=60)
Glucose, Bld: 101 mg/dL — ABNORMAL HIGH (ref 65–99)
POTASSIUM: 3.8 mmol/L (ref 3.5–5.3)
SODIUM: 142 mmol/L (ref 135–146)

## 2018-08-19 ENCOUNTER — Other Ambulatory Visit: Payer: Self-pay | Admitting: Nurse Practitioner

## 2018-08-19 DIAGNOSIS — I10 Essential (primary) hypertension: Secondary | ICD-10-CM

## 2018-08-19 DIAGNOSIS — E119 Type 2 diabetes mellitus without complications: Secondary | ICD-10-CM

## 2018-08-25 ENCOUNTER — Ambulatory Visit (INDEPENDENT_AMBULATORY_CARE_PROVIDER_SITE_OTHER): Payer: Medicare Other

## 2018-08-25 DIAGNOSIS — J309 Allergic rhinitis, unspecified: Secondary | ICD-10-CM

## 2018-09-07 ENCOUNTER — Other Ambulatory Visit: Payer: Self-pay | Admitting: *Deleted

## 2018-09-07 DIAGNOSIS — E119 Type 2 diabetes mellitus without complications: Secondary | ICD-10-CM

## 2018-09-07 MED ORDER — BD LANCET ULTRAFINE 33G MISC: 6 refills | Status: AC

## 2018-09-07 NOTE — Telephone Encounter (Signed)
Patient requested 

## 2018-09-08 DIAGNOSIS — J3089 Other allergic rhinitis: Secondary | ICD-10-CM

## 2018-09-08 NOTE — Progress Notes (Signed)
VIALS EXP 09-08-2019

## 2018-09-08 NOTE — Addendum Note (Signed)
Addended by: Valentina Shaggy on: 09/08/2018 01:22 PM   Modules accepted: Orders

## 2018-09-08 NOTE — Progress Notes (Signed)
Immunotherapy script written: cockroach, dust mites, ragweed (based on her latest skin testing and a recent environmental allergy panel via the blood).  Salvatore Marvel, MD [facility]

## 2018-09-12 ENCOUNTER — Other Ambulatory Visit: Payer: Self-pay | Admitting: Nurse Practitioner

## 2018-09-12 DIAGNOSIS — E119 Type 2 diabetes mellitus without complications: Secondary | ICD-10-CM

## 2018-09-12 DIAGNOSIS — I10 Essential (primary) hypertension: Secondary | ICD-10-CM

## 2018-09-15 ENCOUNTER — Other Ambulatory Visit: Payer: Self-pay

## 2018-09-15 ENCOUNTER — Ambulatory Visit (INDEPENDENT_AMBULATORY_CARE_PROVIDER_SITE_OTHER): Payer: Medicare Other | Admitting: *Deleted

## 2018-09-15 DIAGNOSIS — J309 Allergic rhinitis, unspecified: Secondary | ICD-10-CM | POA: Diagnosis not present

## 2018-09-15 NOTE — Progress Notes (Signed)
Immunotherapy   Patient Details  Name: Cheryl [name] MRN: [id number] Date of Birth: [birth year]  09/15/2018  [name] started injections for  CR-DM-RW Following schedule: B  Frequency: 1-2X Weekly Epi-Pen: Yes [name] is a previous shot patient. She just completed her vials from an outside office and has started her new vial today made in our office. [name] received .75mL of CR-DM-RW in her RUA. Patient waited 30 minutes and did not experience any issues.  Consent signed and patient instructions given.   [name] 09/15/2018, 1:43 PM

## 2018-09-22 ENCOUNTER — Ambulatory Visit (INDEPENDENT_AMBULATORY_CARE_PROVIDER_SITE_OTHER): Payer: Medicare Other

## 2018-09-22 DIAGNOSIS — J309 Allergic rhinitis, unspecified: Secondary | ICD-10-CM

## 2018-09-29 ENCOUNTER — Ambulatory Visit (INDEPENDENT_AMBULATORY_CARE_PROVIDER_SITE_OTHER): Payer: Medicare Other | Admitting: *Deleted

## 2018-09-29 DIAGNOSIS — J309 Allergic rhinitis, unspecified: Secondary | ICD-10-CM

## 2018-10-04 ENCOUNTER — Other Ambulatory Visit: Payer: Self-pay | Admitting: *Deleted

## 2018-10-04 MED ORDER — SIMVASTATIN 20 MG PO TABS: ORAL_TABLET | ORAL | 1 refills | Status: DC

## 2018-10-04 NOTE — Telephone Encounter (Signed)
[facility] [address] 

## 2018-10-06 ENCOUNTER — Ambulatory Visit (INDEPENDENT_AMBULATORY_CARE_PROVIDER_SITE_OTHER): Payer: Medicare Other | Admitting: *Deleted

## 2018-10-06 DIAGNOSIS — J309 Allergic rhinitis, unspecified: Secondary | ICD-10-CM | POA: Diagnosis not present

## 2018-10-07 ENCOUNTER — Encounter: Payer: Self-pay | Admitting: Internal Medicine

## 2018-10-07 ENCOUNTER — Ambulatory Visit (INDEPENDENT_AMBULATORY_CARE_PROVIDER_SITE_OTHER): Payer: Medicare Other | Admitting: Internal Medicine

## 2018-10-07 ENCOUNTER — Other Ambulatory Visit: Payer: Self-pay

## 2018-10-07 DIAGNOSIS — J3089 Other allergic rhinitis: Secondary | ICD-10-CM

## 2018-10-07 DIAGNOSIS — G4733 Obstructive sleep apnea (adult) (pediatric): Secondary | ICD-10-CM | POA: Diagnosis not present

## 2018-10-07 NOTE — Patient Instructions (Signed)
We can continue CPAP auto 5-15, mask of choice, humidifier, supplies, AirView/ card  You can call the [facility] (took over from [facility], same phone number) as needed for supplies or problems with CPAP.  Please call us if we can help

## 2018-10-07 NOTE — Progress Notes (Signed)
[name]    [id number]    Apr 29, 1956  [facility], Cheryl Layman, DO  HPI female never smoker followed for OSA, complicated by asthma( Dr Vaughan Browner), DM 2, HBP, GERD, allergic rhinitis/allergy vaccine (Dr Donneta Romberg), glaucoma NPSG- 11/25/16- AHI 40.3/hour, desaturation to 88%, body weight 286 pounds  ----------------------------------------------------------------   10/05/2017- 63 year old female never smoker followed for OSA, complicated by asthma( Dr Vaughan Browner), DM 2, HBP, GERD, allergic rhinitis/allergy vaccine (Dr Donneta Romberg), glaucoma CPAP auto 5-15/ [facility] ----OSA; DME [facility]. Pt wears CPAP nightly; DL attached. pressure works well and will need order for new supplies.  Feels she is doing well with CPAP.  Download compliance 97% AHI 5.0/hour. Reports an asthma medication from her allergist made her queasy-unsure which one.  ROS-see HPI   + = positive Constitutional:    weight loss, night sweats, fevers, chills, fatigue, lassitude. HEENT:    headaches, difficulty swallowing, tooth/dental problems, sore throat,       sneezing, itching, ear ache, nasal congestion, post nasal drip, snoring CV:    chest pain, orthopnea, PND, swelling in lower extremities, anasarca,                                           dizziness, palpitations Resp:   shortness of breath with exertion or at rest.                productive cough,   non-productive cough, coughing up of blood.              change in color of mucus.  wheezing.   Skin:    rash or lesions. GI:  No-   heartburn, indigestion, abdominal pain, nausea, vomiting, diarrhea,                 change in bowel habits, loss of appetite GU: dysuria, change in color of urine, no urgency or frequency.   flank pain. MS:   joint pain, stiffness, decreased range of motion, back pain. Neuro-     nothing unusual Psych:  change in mood or affect.  depression or anxiety.   memory loss.  OBJ- Physical Exam General- Alert, Oriented,  Affect-appropriate, Distress- none acute, + morbidly obese Skin- rash-none, lesions- none, excoriation- none Lymphadenopathy- none Head- atraumatic            Eyes- Gross vision intact, PERRLA, conjunctivae and secretions clear            Ears- Hearing, canals-normal            Nose- Clear, no-Septal dev, mucus, polyps, erosion, perforation             Throat- Mallampati IV , mucosa clear , drainage- none, tonsils- atrophic, + own teeth Neck- flexible , trachea midline, no stridor , thyroid nl, carotid no bruit Chest - symmetrical excursion , unlabored           Heart/CV- RRR , no murmur , no gallop  , no rub, nl s1 s2                           - JVD- none , edema- none, stasis changes- none, varices- none           Lung- clear to P&A, wheeze- none, cough- none , dullness-none, rub- none  Chest wall-  Abd-  Br/ Gen/ Rectal- Not done, not indicated Extrem- cyanosis- none, clubbing, none, atrophy- none, strength- nl Neuro- grossly intact to observation   10/07/2018- Virtual Visit via Telephone Note  I connected with Cheryl [name] on 10/07/18 at  3:00 PM EDT by telephone and verified that I am speaking with the correct person using two identifiers.  Location: Patient: H Provider: O   I discussed the limitations, risks, security and privacy concerns of performing an evaluation and management service by telephone and the availability of in person appointments. I also discussed with the patient that there may be a patient responsible charge related to this service. The patient expressed understanding and agreed to proceed.   History of Present Illness: 10/07/2018- 63 year old female never smoker followed for OSA, complicated by asthma( Dr Vaughan Browner), DM 2, HBP, GERD, allergic rhinitis/allergy vaccine (Dr Donneta Romberg), glaucoma CPAP auto 5-15/ [facility] Download compliance 83%, AHI 4.6/ hr -----OSA on CPAP, no issues w/ pressure settings, some days feels like her mask doesn't fit her Working  with allergist . Not sure why sometimes mask doesn't seem to fit as well and she takes it off during the night.    Observations/Objective:   Assessment and Plan: OSA- acceptable compliance and control. Continue auto 5-15 Asthma/ Allergy- followed now by her allergist  Follow Up Instructions: 1 year   I discussed the assessment and treatment plan with the patient. The patient was provided an opportunity to ask questions and all were answered. The patient agreed with the plan and demonstrated an understanding of the instructions.   The patient was advised to call back or seek an in-person evaluation if the symptoms worsen or if the condition fails to improve as anticipated.  I provided 15 minutes of non-face-to-face time during this encounter.   Baird Lyons, MD

## 2018-10-13 ENCOUNTER — Ambulatory Visit (INDEPENDENT_AMBULATORY_CARE_PROVIDER_SITE_OTHER): Payer: Medicare Other | Admitting: *Deleted

## 2018-10-13 DIAGNOSIS — J309 Allergic rhinitis, unspecified: Secondary | ICD-10-CM | POA: Diagnosis not present

## 2018-10-20 ENCOUNTER — Ambulatory Visit (INDEPENDENT_AMBULATORY_CARE_PROVIDER_SITE_OTHER): Payer: Medicare Other | Admitting: *Deleted

## 2018-10-20 DIAGNOSIS — J309 Allergic rhinitis, unspecified: Secondary | ICD-10-CM | POA: Diagnosis not present

## 2018-10-26 IMAGING — DX DG CHEST 2V
2 series · 2 of 2 positions shown · non-contrast
Comparison: 11/04/2016

CLINICAL DATA: Shortness of breath

EXAM:
CHEST - 2 VIEW

[chest pa]
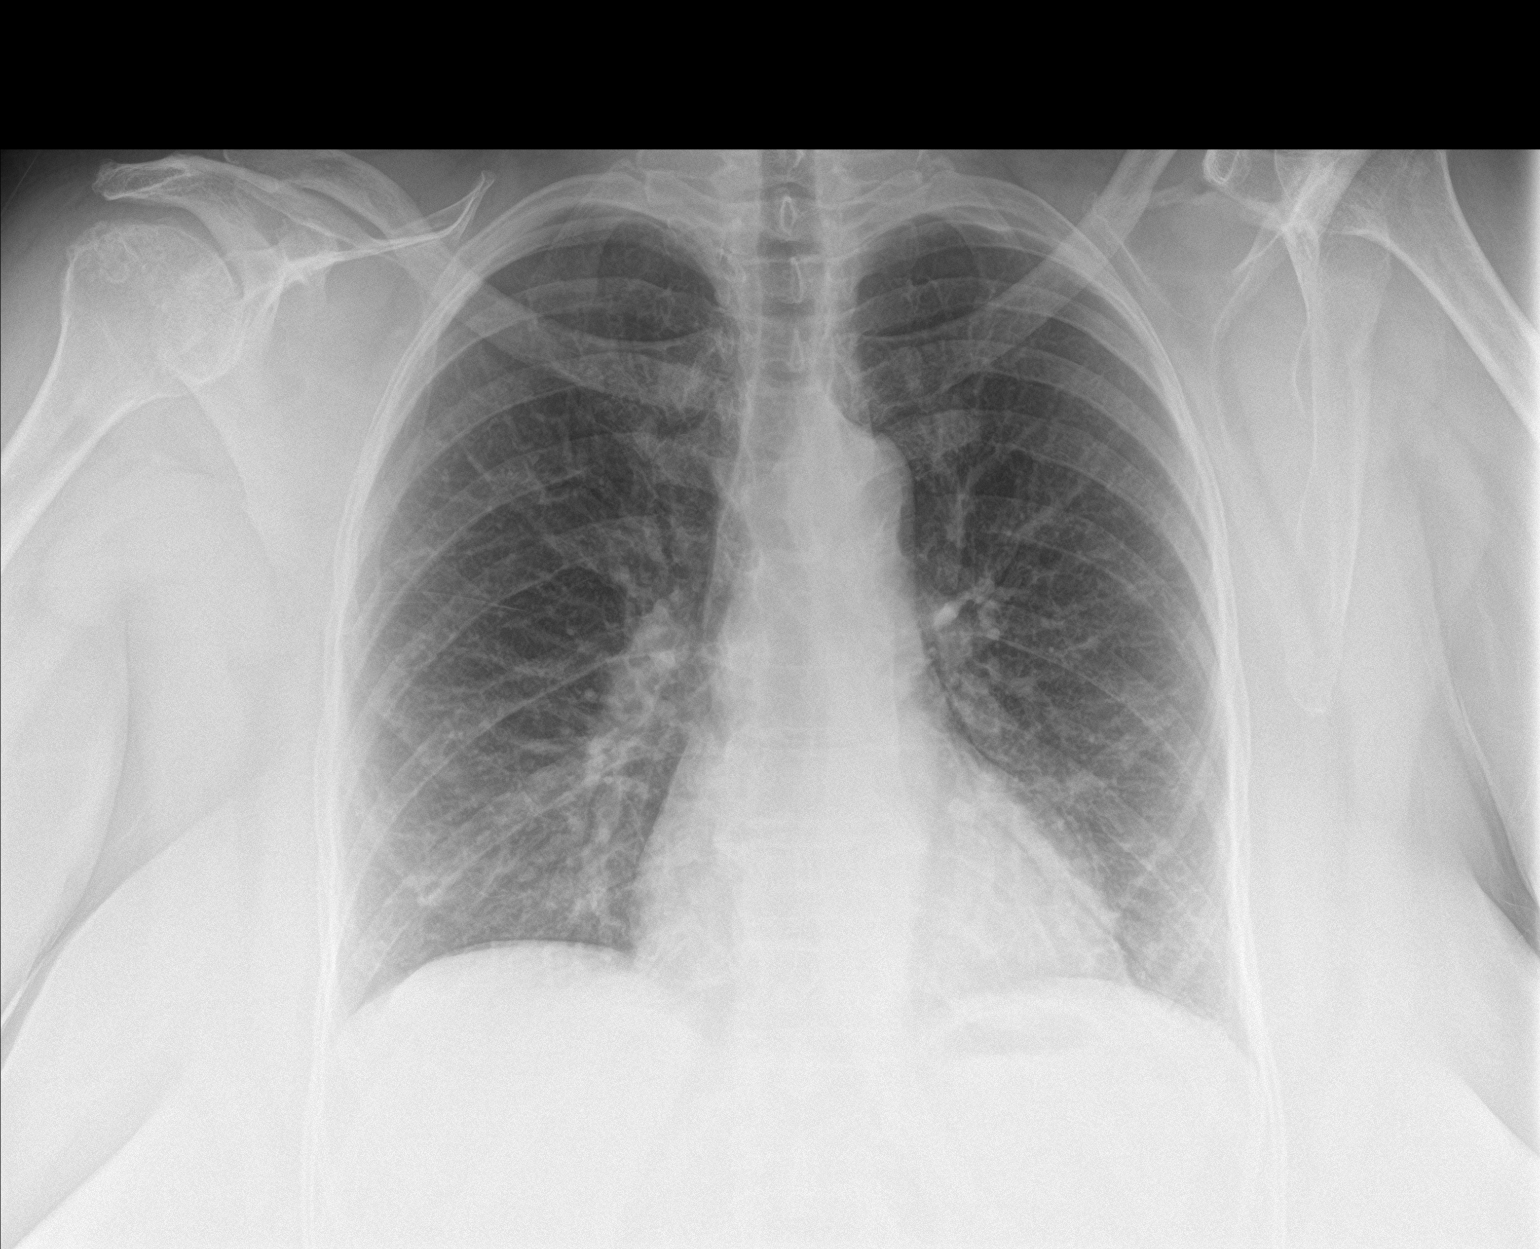

[chest lat]
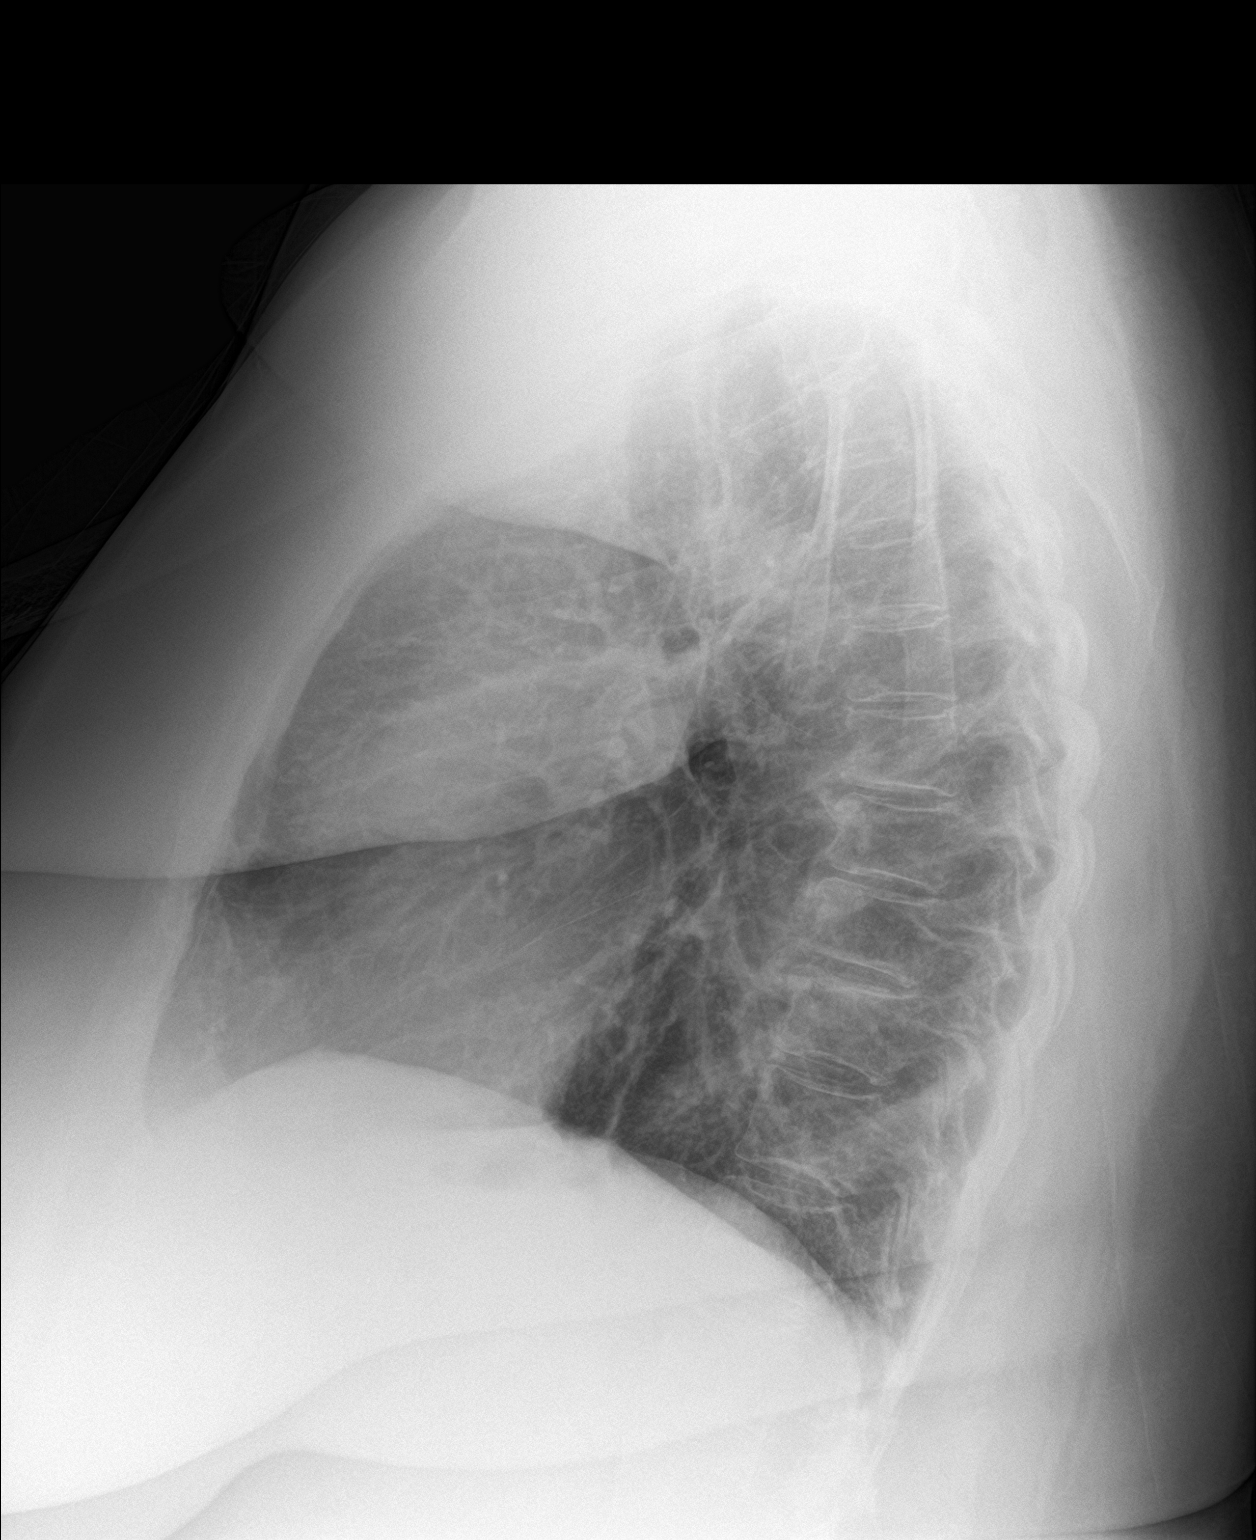

[2 of 2 positions shown; findings below may reference images not displayed]

FINDINGS: Normal heart size and mediastinal contours. No acute infiltrate or
edema. No effusion or pneumothorax. No acute osseous findings.
IMPRESSION: No acute finding.  Stable from 4031.

## 2018-10-27 ENCOUNTER — Ambulatory Visit (INDEPENDENT_AMBULATORY_CARE_PROVIDER_SITE_OTHER): Payer: Medicare Other

## 2018-10-27 DIAGNOSIS — J309 Allergic rhinitis, unspecified: Secondary | ICD-10-CM

## 2018-11-01 ENCOUNTER — Other Ambulatory Visit: Payer: Self-pay | Admitting: Nurse Practitioner

## 2018-11-01 DIAGNOSIS — K219 Gastro-esophageal reflux disease without esophagitis: Secondary | ICD-10-CM

## 2018-11-04 ENCOUNTER — Encounter: Payer: Self-pay | Admitting: Allergy & Immunology

## 2018-11-04 ENCOUNTER — Other Ambulatory Visit: Payer: Self-pay

## 2018-11-04 ENCOUNTER — Ambulatory Visit (INDEPENDENT_AMBULATORY_CARE_PROVIDER_SITE_OTHER): Payer: Medicare Other | Admitting: Allergy & Immunology

## 2018-11-04 VITALS — BP 140/60 | HR 92 | Temp 98.1°F | Resp 16 | Ht 68.0 in | Wt 292.0 lb

## 2018-11-04 DIAGNOSIS — J4540 Moderate persistent asthma, uncomplicated: Secondary | ICD-10-CM | POA: Diagnosis not present

## 2018-11-04 DIAGNOSIS — J3089 Other allergic rhinitis: Secondary | ICD-10-CM | POA: Diagnosis not present

## 2018-11-04 DIAGNOSIS — J309 Allergic rhinitis, unspecified: Secondary | ICD-10-CM | POA: Diagnosis not present

## 2018-11-04 NOTE — Progress Notes (Signed)
FOLLOW UP  Date of Service/Encounter:  11/04/18   Assessment:   Moderate persistent asthma - with improved spirometry today  Perennial allergic rhinitis (dust mites, cockroach, cockroach) - on allergen immunotherapy  Complex medical history    Cheryl [name] presents for follow-up visit.  Her lung testing looks much better today, and we are going to continue with the Symbicort for now.  We had tossed around the idea of adding an anti-IL-5 medication at the last visit due to her poor control, but I think we can hold off on this at this point.  I think a lot of her shortness of breath might be due to deconditioning, as she only seems to have issues when she is walking up hills.  She is working on this by increasing her activity and trying to lose weight.  Her allergic rhinitis seems controlled with the combination of medications and her allergen immunotherapy.  She has been advancing without adverse event.   Plan/Recommendations:   1. Moderate persistent asthma, uncomplicated - Lung testing looks great today compared to last time.  - We will not make any medication changes at this time.  - Daily controller medication(s): Symbicort 160/4.6mcg two puffs twice daily with spacer - Prior to physical activity: albuterol 2 puffs 10-15 minutes before physical activity. - Rescue medications: albuterol 4 puffs every 4-6 hours as needed - Asthma control goals:  * Full participation in all desired activities (may need albuterol before activity) * Albuterol use two time or less a week on average (not counting use with activity) * Cough interfering with sleep two time or less a month * Oral steroids no more than once a year * No hospitalizations  2. Perennial allergic rhinitis (dust mites, cockroach) - Continue with allergy shots at the same schedule.  -Continue with cetirizine 10 mg daily. - Continue with Flonase 1 to 2 sprays per nostril up to twice daily. - Continue with Astelin 1 to 2  sprays per nostril up to twice daily.  3. Return in about 3 months (around 02/04/2019). This can be an in-person, a virtual Webex or a telephone follow up visit.  Subjective:   Cheryl [name] is a 63 y.o. female presenting today for follow up of  Chief Complaint  Patient presents with  . Asthma  . Shortness of Breath    Cheryl [name] has a history of the following: Patient Active Problem List   Diagnosis Date Noted  . OSA (obstructive sleep apnea) 12/02/2016  . Hyperlipidemia LDL goal <100 10/21/2014  . Essential hypertension, benign 10/21/2014  . DM w/o complication type II ([place]) 09/13/2014  . Routine general medical examination at a health care facility 10/12/2013  . 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 05/03/2013  . Need for prophylactic vaccination and inoculation against influenza 02/08/2013  . GERD (gastroesophageal reflux disease) 02/08/2013  . Other and unspecified hyperlipidemia 11/10/2012  . Type II or unspecified type diabetes mellitus without mention of complication, not stated as uncontrolled 10/25/2012  . Impaired fasting glucose 09/07/2012  . Morbid obesity ([place]) 09/07/2012  . Generalized osteoarthrosis, involving multiple sites 09/07/2012  . Carpal tunnel syndrome 09/07/2012  . Other, multiple, and unspecified sites, insect bite, nonvenomous, without mention of infection(919.4) 09/07/2012  . Special screening for malignant neoplasms, colon 09/07/2012  . Other specified cardiac dysrhythmias(427.89) 09/07/2012  . Candidiasis of vulva and vagina 09/07/2012  . Abdominal pain, generalized 09/07/2012  . Symptomatic menopausal or female climacteric states 09/07/2012  . Essential hypertension 09/07/2012  .  Allergic rhinitis 09/07/2012  . Pain in joint, site unspecified 09/07/2012  . Other malaise and fatigue 09/07/2012    History obtained from: chart review and patient.  Cheryl [name] is a 63 y.o. female presenting for a follow up visit.  She was last seen in March 2020  as a new patient.  At that time, she was positive to dust mites and cockroach.  We obtained an environmental allergy panel via the blood that showed sensitization to ragweed.  For her asthma, we changed her to Symbicort 160/4.5 mcg 2 puffs twice daily with albuterol as needed.  This was because her lung testing was in the 50% range.  She has had a normal spirometry in the past.  We continued her on cetirizine 10 mg daily, Flonase 1 to 2 sprays per nostril daily, and Astelin 1 to 2 sprays per nostril daily.  Since the last visit, she has done well. She feels that the Symbicort did provide some relief to her symptoms. Overall she is better.   Asthma/Respiratory Symptom History: She reports that she has only needed her rescue inhaler 2-3 times snce the last time that  saw her.  She only really has symptoms when she is walking uphill. She denies SOB with that. She reports that she is sleepung farly well. She has a CPAP machine managed by Dr. Annamaria Boots.  Allergic Rhinitis Symptom History: Allergy shots are going well. She is havng some itching wth them but otherwise no problems. She is stll using her nose sprays. She is currently in her yellow vial and has advanced without adverse event.  She does report some bloating occasionally. She has seen a gastroenterologist in the past. She has a colonoscopy coming up in 2021. She has not see the GI doctor in a while. She is on omeprazole daily for her reflux, first thing in the morning.   Otherwise, there have been no changes to her past medical history, surgical history, family history, or social history.    Review of Systems  Constitutional: Negative.  Negative for chills, fever, malaise/fatigue and weight loss.  HENT: Negative.  Negative for congestion, ear discharge and ear pain.   Eyes: Negative for pain, discharge and redness.  Respiratory: Positive for shortness of breath. Negative for cough, sputum production and wheezing.   Cardiovascular: Negative.   Negative for chest pain and palpitations.  Gastrointestinal: Negative for abdominal pain, heartburn, nausea and vomiting.  Skin: Negative.  Negative for itching and rash.  Neurological: Negative for dizziness and headaches.  Endo/Heme/Allergies: Negative for environmental allergies. Does not bruise/bleed easily.       Objective:   Blood pressure 140/60, pulse 92, temperature 98.1 F (36.7 C), temperature source Tympanic, resp. rate 16, height 5\' 8"  (1.727 m), weight 292 lb (132.5 kg), SpO2 94 %. Body mass index is 44.4 kg/m.   Physical Exam:  Physical Exam  Constitutional: She appears well-developed.  Pleasant female.  HENT:  Head: Normocephalic and atraumatic.  Right Ear: Tympanic membrane, external ear and ear canal normal.  Left Ear: Tympanic membrane, external ear and ear canal normal.  Nose: Mucosal edema and rhinorrhea present. No nasal deformity or septal deviation. No epistaxis. Right sinus exhibits no maxillary sinus tenderness and no frontal sinus tenderness. Left sinus exhibits no maxillary sinus tenderness and no frontal sinus tenderness.  Mouth/Throat: Uvula is midline and oropharynx is clear and moist. Mucous membranes are not pale and not dry.  Turbinates are markedly enlarged.  Eyes: Pupils are equal, round, and reactive to light.  Conjunctivae and EOM are normal. Right eye exhibits no chemosis and no discharge. Left eye exhibits no chemosis and no discharge. Right conjunctiva is not injected. Left conjunctiva is not injected.  Cardiovascular: Normal rate, regular rhythm and normal heart sounds.  Respiratory: Effort normal and breath sounds normal. No accessory muscle usage. No tachypnea. No respiratory distress. She has no wheezes. She has no rhonchi. She has no rales. She exhibits no tenderness.  Moving air well in all lung fields.  No increased work of breathing.  Lymphadenopathy:    She has no cervical adenopathy.  Neurological: She is alert.  Skin: No abrasion,  no petechiae and no rash noted. Rash is not papular, not vesicular and not urticarial. No erythema. No pallor.  No urticarial or eczematous lesions noted.  Psychiatric: She has a normal mood and affect.     Diagnostic studies:    Spirometry: results normal (FEV1: 1.84/78%, FVC: 2.34/78%, FEV1/FVC: 79%).    Spirometry consistent with normal pattern.   Allergy Studies: none     Salvatore Marvel, MD  [facility]

## 2018-11-04 NOTE — Patient Instructions (Addendum)
1. Moderate persistent asthma, uncomplicated - Lung testing looks great today compared to last time.  - We will not make any medication changes at this time.  - Daily controller medication(s): Symbicort 160/4.58mcg two puffs twice daily with spacer - Prior to physical activity: albuterol 2 puffs 10-15 minutes before physical activity. - Rescue medications: albuterol 4 puffs every 4-6 hours as needed - Asthma control goals:  * Full participation in all desired activities (may need albuterol before activity) * Albuterol use two time or less a week on average (not counting use with activity) * Cough interfering with sleep two time or less a month * Oral steroids no more than once a year * No hospitalizations  2. Perennial allergic rhinitis (dust mites, cockroach) - Continue with allergy shots at the same schedule.  -Continue with cetirizine 10 mg daily. - Continue with Flonase 1 to 2 sprays per nostril up to twice daily. - Continue with Astelin 1 to 2 sprays per nostril up to twice daily.  3. Return in about 3 months (around 02/04/2019). This can be an in-person, a virtual Webex or a telephone follow up visit.   Please inform us of any Emergency Department visits, hospitalizations, or changes in symptoms. Call us before going to the ED for breathing or allergy symptoms since we might be able to fit you in for a sick visit. Feel free to contact us anytime with any questions, problems, or concerns.  It was a pleasure to see you again today!  Websites that have reliable patient information: 1. American Academy of Asthma, Allergy, and Immunology: www.aaaai.org 2. Food Allergy Research and Education (FARE): foodallergy.org 3. Mothers of Asthmatics: http://www.asthmacommunitynetwork.org 4. American College of Allergy, Asthma, and Immunology: www.acaai.org  "Like" [place] on Facebook and Instagram for our latest updates!      Make sure you are registered to vote! If you have moved or changed any  of your contact information, you will need to get this updated before voting!    Voter ID laws are NOT going into effect for the General Election in November 2020! DO NOT let this stop you from exercising your right to vote!   Absentee voting is the SAFEST way to vote during the coronavirus pandemic! Download and print an absentee ballot request form at [email or web address]  More information on absentee ballots can [email or web address] found here: [email or web address]

## 2018-11-10 ENCOUNTER — Ambulatory Visit (INDEPENDENT_AMBULATORY_CARE_PROVIDER_SITE_OTHER): Payer: Medicare Other

## 2018-11-10 DIAGNOSIS — J309 Allergic rhinitis, unspecified: Secondary | ICD-10-CM | POA: Diagnosis not present

## 2018-11-17 ENCOUNTER — Encounter: Payer: Self-pay | Admitting: Nurse Practitioner

## 2018-11-17 ENCOUNTER — Ambulatory Visit: Payer: Self-pay

## 2018-11-17 ENCOUNTER — Ambulatory Visit (INDEPENDENT_AMBULATORY_CARE_PROVIDER_SITE_OTHER): Payer: Medicare Other | Admitting: Nurse Practitioner

## 2018-11-17 ENCOUNTER — Other Ambulatory Visit: Payer: Self-pay

## 2018-11-17 DIAGNOSIS — K219 Gastro-esophageal reflux disease without esophagitis: Secondary | ICD-10-CM | POA: Diagnosis not present

## 2018-11-17 DIAGNOSIS — K5900 Constipation, unspecified: Secondary | ICD-10-CM | POA: Diagnosis not present

## 2018-11-17 DIAGNOSIS — E119 Type 2 diabetes mellitus without complications: Secondary | ICD-10-CM | POA: Diagnosis not present

## 2018-11-17 DIAGNOSIS — I10 Essential (primary) hypertension: Secondary | ICD-10-CM | POA: Diagnosis not present

## 2018-11-17 DIAGNOSIS — E785 Hyperlipidemia, unspecified: Secondary | ICD-10-CM

## 2018-11-17 DIAGNOSIS — G4733 Obstructive sleep apnea (adult) (pediatric): Secondary | ICD-10-CM | POA: Diagnosis not present

## 2018-11-17 DIAGNOSIS — J4540 Moderate persistent asthma, uncomplicated: Secondary | ICD-10-CM

## 2018-11-17 DIAGNOSIS — Z0000 Encounter for general adult medical examination without abnormal findings: Secondary | ICD-10-CM

## 2018-11-17 NOTE — Progress Notes (Signed)
This service is provided via telemedicine  No vital signs collected/recorded due to the encounter was a telemedicine visit.   Location of patient (ex: home, work): Home   Patient consents to a telephone visit:  Yes  Location of the provider (ex: office, home):  [facility], Office   Name of any referring provider: N/A  Names of all persons participating in the telemedicine service and their role in the encounter: S.Chrae B/CMA, Sherrie Mustache, NP, and Patient   Time spent on call: 16 min with medical assistant       Careteam: Patient Care Team: [name], NP as PCP - General (Geriatric Medicine) [name], OD (Optometry) Despina Hick, MD as Consulting Physician (Ophthalmology) Ernst Bowler Gwenith Daily, MD as Consulting Physician (Allergy and Immunology)  Advanced Directive information    Allergies  Allergen Reactions  . Oxycodone     Had stomach and headache as side effect from medicine  . Sulfur Diarrhea    Chief Complaint  Patient presents with  . Medical Management of Chronic Issues    6 month follow-up. Patient c/o bloating, nausea, and bowel changes. Patient c/o bilateral arm pain (sharp) x 1 month or longer.      HPI: Patient is a 63 y.o. female for routine follow up.   htn- taking lisinopril 5 mg by mouth daily with triamterene- hctz 158/67, blood pressure has been running high.  States feet and legs are swelling.  Using salt in her food. No freezer meals or canned foods.  Eating more bacon and sausage.  Reports she has been having headaches recently, ?heat  Le edema- worse over the last few weeks, noticed it when she puts her shoes on. Worse in the evening.   OSA- continues on CPAP, has been using a cleaner  Hyperlipidemia- uses zocor. Eating more bacon and sausage but overall attempting heart healthy.   DM- cbg 110 after she walked. cbg also running in the 90s-110s  GERD- continues on omeprazole. No increase in GERD.    Constipation- never been like this before. Switched vitamins. bloating and constipation worse ?if this is contributing. When she switched this started.  Asthma- getting out of breath when she walks or goes up a hill. Feels like overall has gotten better with the more activity she does.     Has been walking 3-5 days a week for 2-3 months. Goes 5K steps and then will stop- too much activity makes her too tired.  Review of Systems:  Review of Systems  Constitutional: Negative for chills, fever, malaise/fatigue and weight loss.  HENT: Negative for tinnitus.   Respiratory: Positive for shortness of breath (occasionally with exertion ). Negative for cough and sputum production.   Cardiovascular: Positive for leg swelling. Negative for chest pain and palpitations.  Gastrointestinal: Negative for abdominal pain, constipation, diarrhea and heartburn.  Genitourinary: Negative for dysuria, frequency and urgency.  Musculoskeletal: Negative for falls.  Skin: Negative.   Neurological: Negative for dizziness and headaches.  Endo/Heme/Allergies: Positive for environmental allergies.  Psychiatric/Behavioral: Negative for depression and memory loss. The patient does not have insomnia.    Past Medical History:  Diagnosis Date  . Allergic rhinitis, cause unspecified   . Arthritis   . Asthma   . Carpal tunnel syndrome   . Cataract   . Diabetes mellitus without complication (HCC)    diet- controlled  . GERD (gastroesophageal reflux disease)   . Glaucoma   . Hypertension   . Impaired fasting glucose   .  Morbid obesity ([place])   . OSA (obstructive sleep apnea) 12/02/2016  . Other malaise and fatigue   . Other specified cardiac dysrhythmias(427.89)   . Pain in joint, site unspecified   . Symptomatic menopausal or female climacteric states    Past Surgical History:  Procedure Laterality Date  . ABDOMINAL HYSTERECTOMY     1994  . carpal tunnel both hands     2012  . CLOSED MANIPULATION SHOULDER Left  04/2014  . EYE SURGERY Left 08/25/2015  . EYE SURGERY Right 09/24/2015  . JOINT REPLACEMENT     both knees replacement, 2010  . mole removed from face    . Nodule removed from back    . SHOULDER SURGERY Left 11/2013  . TONSILLECTOMY     as teenager   Social History:   reports that she has never smoked. She has never used smokeless tobacco. She reports current alcohol use. She reports that she does not use drugs.  Family History  Problem Relation Age of Onset  . Cancer Father   . Diabetes Father   . Allergic rhinitis Son   . Diabetes Brother   . Allergic rhinitis Son   . Diabetes Paternal Aunt   . Lung cancer Cousin   . Asthma Neg Hx     Medications: Patient's Medications  New Prescriptions   No medications on file  Previous Medications   ACCU-CHEK AVIVA PLUS TEST STRIP    CHECK BLOOD SUGAR ONCE DAILY AS INSTRUCTED DX E11.9   ACETAMINOPHEN (TYLENOL) 500 MG TABLET    Take 500 mg by mouth daily.   ALBUTEROL (PROVENTIL HFA;VENTOLIN HFA) 108 (90 BASE) MCG/ACT INHALER    Inhale 2 puffs into the lungs every 4 (four) hours as needed.   AMBULATORY NON FORMULARY MEDICATION    Medication Name: Allergy Injection- once weekly   ASPIRIN 81 MG TABLET    Take 81 mg by mouth daily.   AZELASTINE (ASTELIN) 0.1 % NASAL SPRAY    1-2 SPRAYS IN EACH NOSTRIL TWICE A DAY NASALLY 30 DAYS   B-D ULTRA-FINE 33 LANCETS MISC    Use to test blood sugar once daily. Dx: E11.9   BLOOD GLUCOSE MONITORING SUPPL (PRODIGY AUTOCODE BLOOD GLUCOSE) DEVI    by Does not apply route. Daily as directed   BUDESONIDE-FORMOTEROL (SYMBICORT) 160-4.5 MCG/ACT INHALER    Inhale 2 puffs into the lungs 2 (two) times daily.   CALCIUM CARBONATE-VITAMIN D (CALTRATE 600+D) 600-400 MG-UNIT PER CHEW TABLET    Chew 1 tablet by mouth daily.    EPIPEN 2-PAK 0.3 MG/0.3ML SOAJ INJECTION       FLUTICASONE (FLONASE) 50 MCG/ACT NASAL SPRAY    Place 1-2 sprays into both nostrils daily.   LEVOCETIRIZINE (XYZAL) 5 MG TABLET    Take 1 tablet (5  mg total) by mouth every evening.   LISINOPRIL (ZESTRIL) 5 MG TABLET    TAKE 1 TABLET BY MOUTH EVERY DAY   LORATADINE (ALAVERT) 10 MG DISSOLVABLE TABLET    Take 10 mg by mouth daily.   MULTIPLE VITAMIN (MULTIVITAMIN) TABLET    Take 1 tablet by mouth every other day.    OMEPRAZOLE (PRILOSEC) 40 MG CAPSULE    TAKE 1 CAPSULE BY MOUTH EVERY DAY   SIMVASTATIN (ZOCOR) 20 MG TABLET    Take one tablet by mouth once daily at bedtime for cholesterol   TRIAMTERENE-HYDROCHLOROTHIAZIDE (MAXZIDE) 75-50 MG TABLET    TAKE ONE TABLET BY MOUTH ONCE DAILY FOR BLOOD PRESSURE   VITAMIN C (ASCORBIC ACID)  500 MG TABLET    Take 500 mg by mouth every other day.   VITAMIN E (VITAMIN E) 400 UNIT CAPSULE    Take 400 Units by mouth every other day.   Modified Medications   No medications on file  Discontinued Medications   No medications on file    Physical Exam:  There were no vitals filed for this visit. There is no height or weight on file to calculate BMI. Wt Readings from Last 3 Encounters:  11/04/18 292 lb (132.5 kg)  08/17/18 298 lb 3.2 oz (135.3 kg)  07/29/18 (!) 301 lb 6.4 oz (136.7 kg)    Labs reviewed: Basic Metabolic Panel: Recent Labs    03/19/18 1040 07/23/18 1045 08/17/18 0934  NA 142 142 142  K 3.7 3.6 3.8  CL 103 103 103  CO2 29 30 31   GLUCOSE 102* 99 101*  BUN 11 12 9   CREATININE 0.81 0.70 0.83  CALCIUM 9.6 9.9 9.9   Liver Function Tests: Recent Labs    03/19/18 1040 07/23/18 1045  AST 14 17  ALT 13 14  BILITOT 0.5 0.5  PROT 6.7 7.1   No results for input(s): LIPASE, AMYLASE in the last 8760 hours. No results for input(s): AMMONIA in the last 8760 hours. CBC: Recent Labs    07/29/18 1443  WBC 8.7  NEUTROABS 5.3  HGB 13.6  HCT 39.9  MCV 82  PLT 398   Lipid Panel: Recent Labs    03/19/18 1040 07/23/18 1045  CHOL 151 155  HDL 51 52  LDLCALC 80 86  TRIG 107 83  CHOLHDL 3.0 3.0   TSH: No results for input(s): TSH in the last 8760 hours. A1C: Lab Results   Component Value Date   HGBA1C 6.3 (H) 07/23/2018     Assessment/Plan 1. Essential hypertension, benign Blood pressure has been elevated, pt has been eating foods higher in sodium and adding salt. Discussed dietary changes and she is willing to make these to see if blood pressure will improve. To cont lisinopril 5 mg daily (may also have to increase this) with triamterene-hctz. DASH diet given.  Will follow up bp in 2 weeks.  2. Moderate persistent asthma, uncomplicated Stable, continues on symbicort and xyzal  3. OSA (obstructive sleep apnea) Stable, continues on CPAP  4. Hyperlipidemia LDL goal <70 LDL 86 on last labs, encouraged to continue to work on diet modifications. Continues on simvastatin 20 mg daily.   5. Type 2 diabetes mellitus without complication, without long-term current use of insulin ([place]) Diet controlled, continues to work on diet modifications.   6. Gastroesophageal reflux disease, esophagitis presence not specified -controlled on omeprazole.   7. Constipation -reports worsening constipation since changing supplement. Encouraged to go back to previous supplement as since she has changed has had more bloating and constipation. Continue with increase in activity, water and laxative at this time.    8. Obesity Has lost weight, continues to work on this with diet and exercise.   Next appt: 12/01/2018 Carlos American. Harle Battiest  [facility] [phone]   Virtual Visit via Video Note  I connected with [name] on 11/17/18 at  1:30 PM EDT by a video enabled telemedicine application and verified that I am speaking with the correct person using two identifiers.  Location: Patient: home Provider: office   I discussed the limitations of evaluation and management by telemedicine and the availability of in person appointments. The patient expressed understanding and agreed  to proceed.    I discussed the assessment and treatment plan  with the patient. The patient was provided an opportunity to ask questions and all were answered. The patient agreed with the plan and demonstrated an understanding of the instructions.   The patient was advised to call back or seek an in-person evaluation if the symptoms worsen or if the condition fails to improve as anticipated.  I provided 25 minutes of non-face-to-face time during this encounter.  Carlos American. Dewaine Oats, AGNP Avs printed and mailed.

## 2018-11-17 NOTE — Progress Notes (Signed)
Subjective:   Cheryl [name] is a 63 y.o. female who presents for Medicare Annual (Subsequent) preventive examination.  Review of Systems:   Cardiac Risk Factors include: advanced age (>22men, >52 women);diabetes mellitus;dyslipidemia;hypertension;obesity (BMI >30kg/m2);sedentary lifestyle     Objective:     Vitals: There were no vitals taken for this visit.  There is no height or weight on file to calculate BMI.  Advanced Directives 11/17/2018 08/17/2018 11/13/2017 11/25/2016 11/19/2016 11/10/2016 10/07/2016  Does Patient Have a Medical Advance Directive? Yes Yes Yes No No No No  Type of Paramedic of [place];Living will Healthcare Power of [place];Living will - - - -  Does patient want to make changes to medical advance directive? No - Patient declined No - Patient declined No - Patient declined - - - -  Copy of [place] in Chart? Yes - validated most recent copy scanned in chart (See row information) Yes - validated most recent copy scanned in chart (See row information) No - copy requested - - - -  Would patient like information on creating a medical advance directive? - - - No - Patient declined No - Patient declined Yes (MAU/Ambulatory/Procedural Areas - Information given) Yes (MAU/Ambulatory/Procedural Areas - Information given)    Tobacco Social History   Tobacco Use  Smoking Status Never Smoker  Smokeless Tobacco Never Used     Counseling given: Not Answered   Clinical Intake:  Pre-visit preparation completed: Yes  Pain : No/denies pain     BMI - recorded: 44.4 Nutritional Status: BMI > 30  Obese Nutritional Risks: None Diabetes: Yes CBG done?: No Did pt. 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 Allergic rhinitis, cause unspecified   . Arthritis   . Asthma   . Carpal tunnel syndrome   . Cataract   . Diabetes mellitus without complication (HCC)    diet- controlled  . GERD (gastroesophageal reflux disease)   . Glaucoma   . Hypertension   . Impaired fasting glucose   . Morbid obesity ([place])   . OSA (obstructive sleep apnea) 12/02/2016  . Other malaise and fatigue   . Other specified cardiac dysrhythmias(427.89)   . Pain in joint, site unspecified   . Symptomatic menopausal or female climacteric states    Past Surgical History:  Procedure Laterality Date  . ABDOMINAL HYSTERECTOMY     1994  . carpal tunnel both hands     2012  . CLOSED MANIPULATION SHOULDER Left 04/2014  . EYE SURGERY Left 08/25/2015  . EYE SURGERY Right 09/24/2015  . JOINT REPLACEMENT     both knees replacement, 2010  . mole removed from face    . Nodule removed from back    . SHOULDER SURGERY Left 11/2013  . TONSILLECTOMY     as teenager   Family History  Problem Relation Age of Onset  . Cancer Father   . Diabetes Father   . Allergic rhinitis Son   . Diabetes Brother   . Allergic rhinitis Son   . Diabetes Paternal Aunt   . Lung cancer Cousin   . Asthma Neg Hx    Social History  Socioeconomic History  . Marital status: Single    Spouse name: Not on file  . Number of children: Not on file  . Years of education: Not on file  . Highest education level: Not on file  Occupational History  . Not on file  Social Needs  . Financial resource strain: Not hard at all  . Food insecurity    Worry: Never true    Inability: Never true  . Transportation needs    Medical: No    Non-medical: No  Tobacco Use  . Smoking status: Never Smoker  . Smokeless tobacco: Never Used  Substance and Sexual Activity  . Alcohol use: Yes    Comment: Seldom- Wine   . Drug use: No  . 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 Physical activity    Days per week: 0 days    Minutes per session: 0 min  . Stress: To some extent  Relationships  . Social Herbalist on phone: More than three times a week    Gets together: Never    Attends religious service: Never    Active member of club or organization: No    Attends meetings of clubs or organizations: Never    Relationship status: Never married  Other Topics Concern  . Not on file  Social History Narrative  . Not on file    Outpatient Encounter Medications as of 11/17/2018  Medication Sig  . ACCU-CHEK AVIVA PLUS test strip CHECK BLOOD SUGAR ONCE DAILY AS INSTRUCTED DX E11.9  . acetaminophen (TYLENOL) 500 MG tablet Take 500 mg by mouth daily.  Marland Kitchen albuterol (PROVENTIL HFA;VENTOLIN HFA) 108 (90 Base) MCG/ACT inhaler Inhale 2 puffs into the lungs every 4 (four) hours as needed.  . AMBULATORY NON FORMULARY MEDICATION Medication Name: Allergy Injection- once weekly  . aspirin 81 MG tablet Take 81 mg by mouth daily.  Marland Kitchen azelastine (ASTELIN) 0.1 % nasal spray 1-2 SPRAYS IN EACH NOSTRIL TWICE A DAY NASALLY 30 DAYS  . B-D ULTRA-FINE 33 LANCETS MISC Use to test blood sugar once daily. Dx: E11.9  . Blood Glucose Monitoring Suppl (PRODIGY AUTOCODE BLOOD GLUCOSE) DEVI by Does not apply route. Daily as directed  . budesonide-formoterol (SYMBICORT) 160-4.5 MCG/ACT inhaler Inhale 2 puffs into the lungs 2 (two) times daily.  . Calcium Carbonate-Vitamin D (CALTRATE 600+D) 600-400 MG-UNIT per chew tablet Chew 1 tablet by mouth daily.   Marland Kitchen EPIPEN 2-PAK 0.3 MG/0.3ML SOAJ injection   . fluticasone (FLONASE) 50 MCG/ACT nasal spray Place 1-2 sprays into both nostrils daily.  Marland Kitchen levocetirizine (XYZAL) 5 MG tablet Take 1 tablet (5 mg total) by mouth every evening.  Marland Kitchen lisinopril (ZESTRIL) 5 MG tablet TAKE 1 TABLET BY MOUTH EVERY DAY  . loratadine (ALAVERT) 10 MG dissolvable tablet Take 10 mg by mouth daily.  . Multiple Vitamin  (MULTIVITAMIN) tablet Take 1 tablet by mouth every other day.   Marland Kitchen omeprazole (PRILOSEC) 40 MG capsule TAKE 1 CAPSULE BY MOUTH EVERY DAY  . simvastatin (ZOCOR) 20 MG tablet Take one tablet by mouth once daily at bedtime for cholesterol  . triamterene-hydrochlorothiazide (MAXZIDE) 75-50 MG tablet TAKE ONE TABLET BY MOUTH ONCE DAILY FOR BLOOD PRESSURE  . vitamin C (ASCORBIC ACID) 500 MG tablet Take 500 mg by mouth every other day.  . vitamin E (VITAMIN E) 400 UNIT capsule Take 400 Units by mouth every other day.   . [DISCONTINUED] Ascorbic Acid (VITAMIN C) 100 MG tablet  Take 1,000 mg by mouth daily.  . [DISCONTINUED] Cholecalciferol 25 MCG (1000 UT) tablet Take 1,000 Units by mouth daily.   No facility-administered encounter medications on file as of 11/17/2018.     Activities of Daily Living In your present state of health, do you have any difficulty performing the following activities: 11/17/2018  Hearing? N  Vision? N  Difficulty concentrating or making decisions? N  Walking or climbing stairs? Y  Dressing or bathing? N  Doing errands, shopping? N  Preparing Food and eating ? N  Using the Toilet? N  In the past six months, have you accidently leaked urine? N  Do you have problems with loss of bowel control? Y  Comment once  Managing your Medications? N  Managing your Finances? N  Housekeeping or managing your Housekeeping? N  Some recent data might be hidden    Patient Care Team: [name], NP as PCP - General (Geriatric Medicine) [name], [place] (Optometry) Despina Hick, MD as Consulting Physician (Ophthalmology) Ernst Bowler Gwenith Daily, MD as Consulting Physician (Allergy and Immunology)    Assessment:   This is a routine wellness examination for Cheryl [name].  Exercise Activities and Dietary recommendations Current Exercise Habits: Home exercise routine, Type of exercise: walking, Time (Minutes): 40, Frequency (Times/Week): 4, Weekly Exercise (Minutes/Week): 160, Intensity:  Mild  Goals    . Weight (lb) < 240 lb (108.9 kg)     Patient will eat smaller meals throughout the day and have portion control       Fall Risk Fall Risk  11/17/2018 07/27/2018 03/23/2018 11/17/2017 11/13/2017  Falls in the past year? 0 0 No No No  Number falls in past yr: 0 0 - - -  Injury with Fall? 0 0 - - -   Is the patient's home free of loose throw rugs in walkways, pet beds, electrical cords, etc?   no      Grab bars in the bathroom? no      Handrails on the stairs?   no      Adequate lighting?   yes  Timed Get Up and Go performed: na  Depression Screen PHQ 2/9 Scores 11/17/2018 11/17/2017 11/13/2017 05/13/2017  PHQ - 2 Score 0 0 0 0  PHQ- 9 Score - - - -     Cognitive Function     6CIT Screen 11/17/2018  What Year? 0 points  What month? 0 points  What time? 0 points  Count back from 20 0 points  Months in reverse 0 points  Repeat phrase 2 points  Total Score 2    Immunization History  Administered Date(s) Administered  . Influenza Split 02/07/2017  . Influenza,inj,Quad PF,6+ Mos 02/08/2013, 02/23/2015, 02/29/2016  . Influenza-Unspecified 01/24/2014, 02/12/2018  . Pneumococcal Polysaccharide-23 05/03/2013  . Td 08/04/2011  . Zoster 05/26/2014, 02/23/2017  . Zoster Recombinat (Shingrix) 12/21/2016, 06/30/2017    Qualifies for Shingles Vaccine?yes  Screening Tests Health Maintenance  Topic Date Due  . FOOT EXAM  11/19/2017  . PNA vac Low Risk Adult (1 of 2 - PCV13) 10/13/2018  . MAMMOGRAM  11/25/2018  . OPHTHALMOLOGY EXAM  12/16/2018  . INFLUENZA VACCINE  12/25/2018  . HEMOGLOBIN A1C  01/21/2019  . COLONOSCOPY  03/11/2020  . TETANUS/TDAP  08/03/2021  . DEXA SCAN  Completed  . PAP SMEAR-Modifier  Discontinued  . Hepatitis C Screening  Discontinued  . HIV Screening  Discontinued    Cancer Screenings: Lung: Low Dose CT Chest recommended if Age 38-80 years, 30 pack-year  currently smoking OR have quit w/in 15years. Patient does not qualify. Breast:  Up  to date on Mammogram? Yes   Up to date of Bone Density/Dexa? Yes Colorectal: up to date  Additional Screenings: : Hepatitis C Screening: na     Plan:      I have personally reviewed and noted the following in the patient's chart:   . Medical and social history . Use of alcohol, tobacco or illicit drugs  . Current medications and supplements . Functional ability and status . Nutritional status . Physical activity . Advanced directives . List of other physicians . Hospitalizations, surgeries, and ER visits in previous 12 months . Vitals . Screenings to include cognitive, depression, and falls . Referrals and appointments  In addition, I have reviewed and discussed with patient certain preventive protocols, quality metrics, and best practice recommendations. A written personalized care plan for preventive services as well as general preventive health recommendations were provided to patient.     [name], NP  11/17/2018

## 2018-11-17 NOTE — Patient Instructions (Signed)
Cheryl [name] , Thank you for taking time to come for your Medicare Wellness Visit. I appreciate your ongoing commitment to your health goals. Please review the following plan we discussed and let me know if I can assist you in the future.   Screening recommendations/referrals: Colonoscopy up to date Mammogram up to date Bone Density up to date Recommended yearly ophthalmology/optometry visit for glaucoma screening and checkup Recommended yearly dental visit for hygiene and checkup  Vaccinations: Influenza vaccine 12/2018 Pneumococcal vaccine up to date Tdap vaccine up to date Shingles vaccine up to date    Advanced directives: on file  Conditions/risks identified: cardiovascular risk factors noted. Weight loss encouraged through proper diet and exercise.   Next appointment: 1 year   Preventive Care 65 Years and Older, Female Preventive care refers to lifestyle choices and visits with your health care provider that can promote health and wellness. What does preventive care include?  A yearly physical exam. This is also called an annual well check.  Dental exams once or twice a year.  Routine eye exams. Ask your health care provider how often you should have your eyes checked.  Personal lifestyle choices, including:  Daily care of your teeth and gums.  Regular physical activity.  Eating a healthy diet.  Avoiding tobacco and drug use.  Limiting alcohol use.  Practicing safe sex.  Taking low-dose aspirin every day.  Taking vitamin and mineral supplements as recommended by your health care provider. What happens during an annual well check? The services and screenings done by your health care provider during your annual well check will depend on your age, overall health, lifestyle risk factors, and family history of disease. Counseling  Your health care provider may ask you questions about your:  Alcohol use.  Tobacco use.  Drug use.  Emotional well-being.  Home  and relationship well-being.  Sexual activity.  Eating habits.  History of falls.  Memory and ability to understand (cognition).  Work and work Statistician.  Reproductive health. Screening  You may have the following tests or measurements:  Height, weight, and BMI.  Blood pressure.  Lipid and cholesterol levels. These may be checked every 5 years, or more frequently if you are over 65 years old.  Skin check.  Lung cancer screening. You may have this screening every year starting at age 39 if you have a 30-pack-year history of smoking and currently smoke or have quit within the past 15 years.  Fecal occult blood test (FOBT) of the stool. You may have this test every year starting at age 65.  Flexible sigmoidoscopy or colonoscopy. You may have a sigmoidoscopy every 5 years or a colonoscopy every 10 years starting at age 65.  Hepatitis C blood test.  Hepatitis B blood test.  Sexually transmitted disease (STD) testing.  Diabetes screening. This is done by checking your blood sugar (glucose) after you have not eaten for a while (fasting). You may have this done every 1-3 years.  Bone density scan. This is done to screen for osteoporosis. You may have this done starting at age 65  Mammogram. This may be done every 1-2 years. Talk to your health care provider about how often you should have regular mammograms. Talk with your health care provider about your test results, treatment options, and if necessary, the need for more tests. Vaccines  Your health care provider may recommend certain vaccines, such as:  Influenza vaccine. This is recommended every year.  Tetanus, diphtheria, and acellular pertussis (Tdap, Td) vaccine.  You may need a Td booster every 10 years.  Zoster vaccine. You may need this after age 13.  Pneumococcal 13-valent conjugate (PCV13) vaccine. One dose is recommended after age 65  Pneumococcal polysaccharide (PPSV23) vaccine. One dose is recommended  after age 65 Talk to your health care provider about which screenings and vaccines you need and how often you need them. This information is not intended to replace advice given to you by your health care provider. Make sure you discuss any questions you have with your health care provider. Document Released: 06/08/2015 Document Revised: 01/30/2016 Document Reviewed: 03/13/2015 Elsevier Interactive Patient Education  2017 [place] Prevention in the Home Falls can cause injuries. They can happen to people of all ages. There are many things you can do to make your home safe and to help prevent falls. What can I do on the outside of my home?  Regularly fix the edges of walkways and driveways and fix any cracks.  Remove anything that might make you trip as you walk through a door, such as a raised step or threshold.  Trim any bushes or trees on the path to your home.  Use bright outdoor lighting.  Clear any walking paths of anything that might make someone trip, such as rocks or tools.  Regularly check to see if handrails are loose or broken. Make sure that both sides of any steps have handrails.  Any raised decks and porches should have guardrails on the edges.  Have any leaves, snow, or ice cleared regularly.  Use sand or salt on walking paths during winter.  Clean up any spills in your garage right away. This includes oil or grease spills. What can I do in the bathroom?  Use night lights.  Install grab bars by the toilet and in the tub and shower. Do not use towel bars as grab bars.  Use non-skid mats or decals in the tub or shower.  If you need to sit down in the shower, use a plastic, non-slip stool.  Keep the floor dry. Clean up any water that spills on the floor as soon as it happens.  Remove soap buildup in the tub or shower regularly.  Attach bath mats securely with double-sided non-slip rug tape.  Do not have throw rugs and other things on the floor  that can make you trip. What can I do in the bedroom?  Use night lights.  Make sure that you have a light by your bed that is easy to reach.  Do not use any sheets or blankets that are too big for your bed. They should not hang down onto the floor.  Have a firm chair that has side arms. You can use this for support while you get dressed.  Do not have throw rugs and other things on the floor that can make you trip. What can I do in the kitchen?  Clean up any spills right away.  Avoid walking on wet floors.  Keep items that you use a lot in easy-to-reach places.  If you need to reach something above you, use a strong step stool that has a grab bar.  Keep electrical cords out of the way.  Do not use floor polish or wax that makes floors slippery. If you must use wax, use non-skid floor wax.  Do not have throw rugs and other things on the floor that can make you trip. What can I do with my stairs?  Do not leave any  items on the stairs.  Make sure that there are handrails on both sides of the stairs and use them. Fix handrails that are broken or loose. Make sure that handrails are as long as the stairways.  Check any carpeting to make sure that it is firmly attached to the stairs. Fix any carpet that is loose or worn.  Avoid having throw rugs at the top or bottom of the stairs. If you do have throw rugs, attach them to the floor with carpet tape.  Make sure that you have a light switch at the top of the stairs and the bottom of the stairs. If you do not have them, ask someone to add them for you. What else can I do to help prevent falls?  Wear shoes that:  Do not have high heels.  Have rubber bottoms.  Are comfortable and fit you well.  Are closed at the toe. Do not wear sandals.  If you use a stepladder:  Make sure that it is fully opened. Do not climb a closed stepladder.  Make sure that both sides of the stepladder are locked into place.  Ask someone to hold it  for you, if possible.  Clearly mark and make sure that you can see:  Any grab bars or handrails.  First and last steps.  Where the edge of each step is.  Use tools that help you move around (mobility aids) if they are needed. These include:  Canes.  Walkers.  Scooters.  Crutches.  Turn on the lights when you go into a dark area. Replace any light bulbs as soon as they burn out.  Set up your furniture so you have a clear path. Avoid moving your furniture around.  If any of your floors are uneven, fix them.  If there are any pets around you, be aware of where they are.  Review your medicines with your doctor. Some medicines can make you feel dizzy. This can increase your chance of falling. Ask your doctor what other things that you can do to help prevent falls. This information is not intended to replace advice given to you by your health care provider. Make sure you discuss any questions you have with your health care provider. Document Released: 03/08/2009 Document Revised: 10/18/2015 Document Reviewed: 06/16/2014 Elsevier Interactive Patient Education  2017 [employer].

## 2018-11-17 NOTE — Patient Instructions (Signed)
Keep up the good work with walking!  Continue to work on diet changes  To check blood pressure ~3 times a week and write this down.  Follow up in 2 weeks on blood pressure and swelling.   DASH Eating Plan DASH stands for "Dietary Approaches to Stop Hypertension." The DASH eating plan is a healthy eating plan that has been shown to reduce high blood pressure (hypertension). It may also reduce your risk for type 2 diabetes, heart disease, and stroke. The DASH eating plan may also help with weight loss. What are tips for following this plan?  General guidelines  Avoid eating more than 2,300 mg (milligrams) of salt (sodium) a day. If you have hypertension, you may need to reduce your sodium intake to 1,500 mg a day.  Limit alcohol intake to no more than 1 drink a day for nonpregnant women and 2 drinks a day for men. One drink equals 12 oz of beer, 5 oz of wine, or 1 oz of hard liquor.  Work with your health care provider to maintain a healthy body weight or to lose weight. Ask what an ideal weight is for you.  Get at least 30 minutes of exercise that causes your heart to beat faster (aerobic exercise) most days of the week. Activities may include walking, swimming, or biking.  Work with your health care provider or diet and nutrition specialist (dietitian) to adjust your eating plan to your individual calorie needs. Reading food labels   Check food labels for the amount of sodium per serving. Choose foods with less than 5 percent of the Daily Value of sodium. Generally, foods with less than 300 mg of sodium per serving fit into this eating plan.  To find whole grains, look for the word "whole" as the first word in the ingredient list. Shopping  Buy products labeled as "low-sodium" or "no salt added."  Buy fresh foods. Avoid canned foods and premade or frozen meals. Cooking  Avoid adding salt when cooking. Use salt-free seasonings or herbs instead of table salt or sea salt. Check with  your health care provider or pharmacist before using salt substitutes.  Do not fry foods. Cook foods using healthy methods such as baking, boiling, grilling, and broiling instead.  Cook with heart-healthy oils, such as olive, canola, soybean, or sunflower oil. Meal planning  Eat a balanced diet that includes: ? 5 or more servings of fruits and vegetables each day. At each meal, try to fill half of your plate with fruits and vegetables. ? Up to 6-8 servings of whole grains each day. ? Less than 6 oz of lean meat, poultry, or fish each day. A 3-oz serving of meat is about the same size as a deck of cards. One egg equals 1 oz. ? 2 servings of low-fat dairy each day. ? A serving of nuts, seeds, or beans 5 times each week. ? Heart-healthy fats. Healthy fats called Omega-3 fatty acids are found in foods such as flaxseeds and coldwater fish, like sardines, salmon, and mackerel.  Limit how much you eat of the following: ? Canned or prepackaged foods. ? Food that is high in trans fat, such as fried foods. ? Food that is high in saturated fat, such as fatty meat. ? Sweets, desserts, sugary drinks, and other foods with added sugar. ? Full-fat dairy products.  Do not salt foods before eating.  Try to eat at least 2 vegetarian meals each week.  Eat more home-cooked food and less restaurant, buffet, and  fast food.  When eating at a restaurant, ask that your food be prepared with less salt or no salt, if possible. What foods are recommended? The items listed may not be a complete list. Talk with your dietitian about what dietary choices are best for you. Grains Whole-grain or whole-wheat bread. Whole-grain or whole-wheat pasta. Brown rice. [place]. Bulgur. Whole-grain and low-sodium cereals. Pita bread. Low-fat, low-sodium crackers. Whole-wheat flour tortillas. Vegetables Fresh or frozen vegetables (raw, steamed, roasted, or grilled). Low-sodium or reduced-sodium tomato and vegetable  juice. Low-sodium or reduced-sodium tomato sauce and tomato paste. Low-sodium or reduced-sodium canned vegetables. Fruits All fresh, dried, or frozen fruit. Canned fruit in natural juice (without added sugar). Meat and other protein foods Skinless chicken or [place]. Ground chicken or [place]. Pork with fat trimmed off. Fish and seafood. Egg whites. Dried beans, peas, or lentils. Unsalted nuts, nut butters, and seeds. Unsalted canned beans. Lean cuts of beef with fat trimmed off. Low-sodium, lean deli meat. Dairy Low-fat (1%) or fat-free (skim) milk. Fat-free, low-fat, or reduced-fat cheeses. Nonfat, low-sodium ricotta or cottage cheese. Low-fat or nonfat yogurt. Low-fat, low-sodium cheese. Fats and oils Soft margarine without trans fats. Vegetable oil. Low-fat, reduced-fat, or light mayonnaise and salad dressings (reduced-sodium). Canola, safflower, olive, soybean, and sunflower oils. Avocado. Seasoning and other foods Herbs. Spices. Seasoning mixes without salt. Unsalted popcorn and pretzels. Fat-free sweets. What foods are not recommended? The items listed may not be a complete list. Talk with your dietitian about what dietary choices are best for you. Grains Baked goods made with fat, such as croissants, muffins, or some breads. Dry pasta or rice meal packs. Vegetables Creamed or fried vegetables. Vegetables in a cheese sauce. Regular canned vegetables (not low-sodium or reduced-sodium). Regular canned tomato sauce and paste (not low-sodium or reduced-sodium). Regular tomato and vegetable juice (not low-sodium or reduced-sodium). Angie Fava. Olives. Fruits Canned fruit in a light or heavy syrup. Fried fruit. Fruit in cream or butter sauce. Meat and other protein foods Fatty cuts of meat. Ribs. Fried meat. Berniece Salines. Sausage. Bologna and other processed lunch meats. Salami. Fatback. Hotdogs. Bratwurst. Salted nuts and seeds. Canned beans with added salt. Canned or smoked fish. Whole eggs or egg yolks.  Chicken or [place] with skin. Dairy Whole or 2% milk, cream, and half-and-half. Whole or full-fat cream cheese. Whole-fat or sweetened yogurt. Full-fat cheese. Nondairy creamers. Whipped toppings. Processed cheese and cheese spreads. Fats and oils Butter. Stick margarine. Lard. Shortening. Ghee. Bacon fat. Tropical oils, such as coconut, palm kernel, or palm oil. Seasoning and other foods Salted popcorn and pretzels. Onion salt, garlic salt, seasoned salt, table salt, and sea salt. Worcestershire sauce. Tartar sauce. Barbecue sauce. Teriyaki sauce. Soy sauce, including reduced-sodium. Steak sauce. Canned and packaged gravies. Fish sauce. Oyster sauce. Cocktail sauce. Horseradish that you find on the shelf. Ketchup. Mustard. Meat flavorings and tenderizers. Bouillon cubes. Hot sauce and Tabasco sauce. Premade or packaged marinades. Premade or packaged taco seasonings. Relishes. Regular salad dressings. Where to find more information:  National Heart, Lung, and [place]: https://wilson-eaton.com/  American Heart Association: www.heart.org Summary  The DASH eating plan is a healthy eating plan that has been shown to reduce high blood pressure (hypertension). It may also reduce your risk for type 2 diabetes, heart disease, and stroke.  With the DASH eating plan, you should limit salt (sodium) intake to 2,300 mg a day. If you have hypertension, you may need to reduce your sodium intake to 1,500 mg a day.  When on the  DASH eating plan, aim to eat more fresh fruits and vegetables, whole grains, lean proteins, low-fat dairy, and heart-healthy fats.  Work with your health care provider or diet and nutrition specialist (dietitian) to adjust your eating plan to your individual calorie needs. This information is not intended to replace advice given to you by your health care provider. Make sure you discuss any questions you have with your health care provider. Document Released: 05/01/2011 Document Revised:  05/05/2016 Document Reviewed: 05/05/2016 Elsevier Interactive Patient Education  2019 [employer].   Constipation, Adult Constipation is when a person has fewer bowel movements in a week than normal, has difficulty having a bowel movement, or has stools that are dry, hard, or larger than normal. Constipation may be caused by an underlying condition. It may become worse with age if a person takes certain medicines and does not take in enough fluids. Follow these instructions at home: Eating and drinking   Eat foods that have a lot of fiber, such as fresh fruits and vegetables, whole grains, and beans.  Limit foods that are high in fat, low in fiber, or overly processed, such as french fries, hamburgers, cookies, candies, and soda.  Drink enough fluid to keep your urine clear or pale yellow. General instructions  Exercise regularly or as told by your health care provider.  Go to the restroom when you have the urge to go. Do not hold it in.  Take over-the-counter and prescription medicines only as told by your health care provider. These include any fiber supplements.  Practice pelvic floor retraining exercises, such as deep breathing while relaxing the lower abdomen and pelvic floor relaxation during bowel movements.  Watch your condition for any changes.  Keep all follow-up visits as told by your health care provider. This is important. Contact a health care provider if:  You have pain that gets worse.  You have a fever.  You do not have a bowel movement after 4 days.  You vomit.  You are not hungry.  You lose weight.  You are bleeding from the anus.  You have thin, pencil-like stools. Get help right away if:  You have a fever and your symptoms suddenly get worse.  You leak stool or have blood in your stool.  Your abdomen is bloated.  You have severe pain in your abdomen.  You feel dizzy or you faint. This information is not intended to replace advice given to  you by your health care provider. Make sure you discuss any questions you have with your health care provider. Document Released: 02/08/2004 Document Revised: 11/30/2015 Document Reviewed: 10/31/2015 Elsevier Interactive Patient Education  2019 [employer].

## 2018-11-17 NOTE — Progress Notes (Signed)
   This service is provided via telemedicine  No vital signs collected/recorded due to the encounter was a telemedicine visit.   Location of patient (ex: home, work):  Home  Patient consents to a telephone visit:  Yes  Location of the provider (ex: office, home):  [facility], Office   Name of any referring provider: N/A  Names of all persons participating in the telemedicine service and their role in the encounter:  S.Chrae B/CMA, Jessica Eubanks, NP, and Patient   Time spent on call: 10 min with medical assistant   

## 2018-11-18 ENCOUNTER — Ambulatory Visit (INDEPENDENT_AMBULATORY_CARE_PROVIDER_SITE_OTHER): Payer: Medicare Other | Admitting: *Deleted

## 2018-11-18 DIAGNOSIS — J309 Allergic rhinitis, unspecified: Secondary | ICD-10-CM | POA: Diagnosis not present

## 2018-11-21 NOTE — Assessment & Plan Note (Signed)
Continues to benefit from CPA but may need to refit mask.  Plan- continue auto 5-15, discuss mask with DME

## 2018-11-21 NOTE — Assessment & Plan Note (Signed)
Followed now on allergy vaccine with her allergist

## 2018-11-23 ENCOUNTER — Other Ambulatory Visit: Payer: Self-pay | Admitting: *Deleted

## 2018-11-23 MED ORDER — TRIAMTERENE-HCTZ 75-50 MG PO TABS: ORAL_TABLET | ORAL | 1 refills | Status: DC

## 2018-11-23 NOTE — Telephone Encounter (Signed)
[facility] [address] 

## 2018-11-24 ENCOUNTER — Ambulatory Visit (INDEPENDENT_AMBULATORY_CARE_PROVIDER_SITE_OTHER): Payer: Medicare Other | Admitting: *Deleted

## 2018-11-24 ENCOUNTER — Telehealth: Payer: Self-pay | Admitting: *Deleted

## 2018-11-24 DIAGNOSIS — J309 Allergic rhinitis, unspecified: Secondary | ICD-10-CM

## 2018-11-24 NOTE — Telephone Encounter (Signed)
Thanks for the notification.  I hope she has a low threshold to restart her Symbicort if her symptoms return.  Salvatore Marvel, MD [facility] [place]

## 2018-11-24 NOTE — Telephone Encounter (Signed)
I do believe that if her BP remains the same w/o the Symbicort she will restart accordingly, or is she has an asthma flare.

## 2018-11-24 NOTE — Telephone Encounter (Signed)
Sounds good.   Joel Gallagher, MD [facility]  

## 2018-11-24 NOTE — Telephone Encounter (Signed)
Patient came in today to get her allergy injection and informed that she has been having a high BP lately in the lower 200's. Patient states that her provider has added an additional BP medication but for now it has not come down. Patient has currently stopped her Symbicort for the last 2 days to see if this would help and has not seen a change. Her asthma is doing fine at this moment in time. Checked her BP in office and it was 188/66.

## 2018-11-29 ENCOUNTER — Other Ambulatory Visit: Payer: Self-pay

## 2018-11-29 MED ORDER — FLUTICASONE PROPIONATE 50 MCG/ACT NA SUSP: 1.0000 | Freq: Every day | NASAL | 2 refills | Status: DC

## 2018-12-01 ENCOUNTER — Encounter: Payer: Self-pay | Admitting: Nurse Practitioner

## 2018-12-01 ENCOUNTER — Ambulatory Visit (INDEPENDENT_AMBULATORY_CARE_PROVIDER_SITE_OTHER): Payer: Medicare Other | Admitting: *Deleted

## 2018-12-01 ENCOUNTER — Other Ambulatory Visit: Payer: Self-pay

## 2018-12-01 ENCOUNTER — Ambulatory Visit (INDEPENDENT_AMBULATORY_CARE_PROVIDER_SITE_OTHER): Payer: Medicare Other | Admitting: Nurse Practitioner

## 2018-12-01 DIAGNOSIS — I10 Essential (primary) hypertension: Secondary | ICD-10-CM

## 2018-12-01 DIAGNOSIS — J309 Allergic rhinitis, unspecified: Secondary | ICD-10-CM | POA: Diagnosis not present

## 2018-12-01 DIAGNOSIS — E119 Type 2 diabetes mellitus without complications: Secondary | ICD-10-CM | POA: Diagnosis not present

## 2018-12-01 MED ORDER — LISINOPRIL 10 MG PO TABS: 10.0000 mg | ORAL_TABLET | Freq: Every day | ORAL | 1 refills | Status: DC

## 2018-12-01 NOTE — Patient Instructions (Signed)
Increase lisinopril to 10 mg daily, can take 2 of your 5 mg tablet until complete  GOAL BP <140/90  Continue to walk    DASH Eating Plan DASH stands for "Dietary Approaches to Stop Hypertension." The DASH eating plan is a healthy eating plan that has been shown to reduce high blood pressure (hypertension). It may also reduce your risk for type 2 diabetes, heart disease, and stroke. The DASH eating plan may also help with weight loss. What are tips for following this plan?  General guidelines  Avoid eating more than 2,300 mg (milligrams) of salt (sodium) a day. If you have hypertension, you may need to reduce your sodium intake to 1,500 mg a day.  Limit alcohol intake to no more than 1 drink a day for nonpregnant women and 2 drinks a day for men. One drink equals 12 oz of beer, 5 oz of wine, or 1 oz of hard liquor.  Work with your health care provider to maintain a healthy body weight or to lose weight. Ask what an ideal weight is for you.  Get at least 30 minutes of exercise that causes your heart to beat faster (aerobic exercise) most days of the week. Activities may include walking, swimming, or biking.  Work with your health care provider or diet and nutrition specialist (dietitian) to adjust your eating plan to your individual calorie needs. Reading food labels   Check food labels for the amount of sodium per serving. Choose foods with less than 5 percent of the Daily Value of sodium. Generally, foods with less than 300 mg of sodium per serving fit into this eating plan.  To find whole grains, look for the word "whole" as the first word in the ingredient list. Shopping  Buy products labeled as "low-sodium" or "no salt added."  Buy fresh foods. Avoid canned foods and premade or frozen meals. Cooking  Avoid adding salt when cooking. Use salt-free seasonings or herbs instead of table salt or sea salt. Check with your health care provider or pharmacist before using salt  substitutes.  Do not fry foods. Cook foods using healthy methods such as baking, boiling, grilling, and broiling instead.  Cook with heart-healthy oils, such as olive, canola, soybean, or sunflower oil. Meal planning  Eat a balanced diet that includes: ? 5 or more servings of fruits and vegetables each day. At each meal, try to fill half of your plate with fruits and vegetables. ? Up to 6-8 servings of whole grains each day. ? Less than 6 oz of lean meat, poultry, or fish each day. A 3-oz serving of meat is about the same size as a deck of cards. One egg equals 1 oz. ? 2 servings of low-fat dairy each day. ? A serving of nuts, seeds, or beans 5 times each week. ? Heart-healthy fats. Healthy fats called Omega-3 fatty acids are found in foods such as flaxseeds and coldwater fish, like sardines, salmon, and mackerel.  Limit how much you eat of the following: ? Canned or prepackaged foods. ? Food that is high in trans fat, such as fried foods. ? Food that is high in saturated fat, such as fatty meat. ? Sweets, desserts, sugary drinks, and other foods with added sugar. ? Full-fat dairy products.  Do not salt foods before eating.  Try to eat at least 2 vegetarian meals each week.  Eat more home-cooked food and less restaurant, buffet, and fast food.  When eating at a restaurant, ask that your food be  prepared with less salt or no salt, if possible. What foods are recommended? The items listed may not be a complete list. Talk with your dietitian about what dietary choices are best for you. Grains Whole-grain or whole-wheat bread. Whole-grain or whole-wheat pasta. Bitton rice. [place]. Bulgur. Whole-grain and low-sodium cereals. Pita bread. Low-fat, low-sodium crackers. Whole-wheat flour tortillas. Vegetables Fresh or frozen vegetables (raw, steamed, roasted, or grilled). Low-sodium or reduced-sodium tomato and vegetable juice. Low-sodium or reduced-sodium tomato sauce and tomato  paste. Low-sodium or reduced-sodium canned vegetables. Fruits All fresh, dried, or frozen fruit. Canned fruit in natural juice (without added sugar). Meat and other protein foods Skinless chicken or [place]. Ground chicken or [place]. Pork with fat trimmed off. Fish and seafood. Egg whites. Dried beans, peas, or lentils. Unsalted nuts, nut butters, and seeds. Unsalted canned beans. Lean cuts of beef with fat trimmed off. Low-sodium, lean deli meat. Dairy Low-fat (1%) or fat-free (skim) milk. Fat-free, low-fat, or reduced-fat cheeses. Nonfat, low-sodium ricotta or cottage cheese. Low-fat or nonfat yogurt. Low-fat, low-sodium cheese. Fats and oils Soft margarine without trans fats. Vegetable oil. Low-fat, reduced-fat, or light mayonnaise and salad dressings (reduced-sodium). Canola, safflower, olive, soybean, and sunflower oils. Avocado. Seasoning and other foods Herbs. Spices. Seasoning mixes without salt. Unsalted popcorn and pretzels. Fat-free sweets. What foods are not recommended? The items listed may not be a complete list. Talk with your dietitian about what dietary choices are best for you. Grains Baked goods made with fat, such as croissants, muffins, or some breads. Dry pasta or rice meal packs. Vegetables Creamed or fried vegetables. Vegetables in a cheese sauce. Regular canned vegetables (not low-sodium or reduced-sodium). Regular canned tomato sauce and paste (not low-sodium or reduced-sodium). Regular tomato and vegetable juice (not low-sodium or reduced-sodium). Angie Fava. Olives. Fruits Canned fruit in a light or heavy syrup. Fried fruit. Fruit in cream or butter sauce. Meat and other protein foods Fatty cuts of meat. Ribs. Fried meat. Berniece Salines. Sausage. Bologna and other processed lunch meats. Salami. Fatback. Hotdogs. Bratwurst. Salted nuts and seeds. Canned beans with added salt. Canned or smoked fish. Whole eggs or egg yolks. Chicken or [place] with skin. Dairy Whole or 2% milk,  cream, and half-and-half. Whole or full-fat cream cheese. Whole-fat or sweetened yogurt. Full-fat cheese. Nondairy creamers. Whipped toppings. Processed cheese and cheese spreads. Fats and oils Butter. Stick margarine. Lard. Shortening. Ghee. Bacon fat. Tropical oils, such as coconut, palm kernel, or palm oil. Seasoning and other foods Salted popcorn and pretzels. Onion salt, garlic salt, seasoned salt, table salt, and sea salt. Worcestershire sauce. Tartar sauce. Barbecue sauce. Teriyaki sauce. Soy sauce, including reduced-sodium. Steak sauce. Canned and packaged gravies. Fish sauce. Oyster sauce. Cocktail sauce. Horseradish that you find on the shelf. Ketchup. Mustard. Meat flavorings and tenderizers. Bouillon cubes. Hot sauce and Tabasco sauce. Premade or packaged marinades. Premade or packaged taco seasonings. Relishes. Regular salad dressings. Where to find more information:  National Heart, Lung, and [place]: https://wilson-eaton.com/  American Heart Association: www.heart.org Summary  The DASH eating plan is a healthy eating plan that has been shown to reduce high blood pressure (hypertension). It may also reduce your risk for type 2 diabetes, heart disease, and stroke.  With the DASH eating plan, you should limit salt (sodium) intake to 2,300 mg a day. If you have hypertension, you may need to reduce your sodium intake to 1,500 mg a day.  When on the DASH eating plan, aim to eat more fresh fruits and vegetables, whole grains,  lean proteins, low-fat dairy, and heart-healthy fats.  Work with your health care provider or diet and nutrition specialist (dietitian) to adjust your eating plan to your individual calorie needs. This information is not intended to replace advice given to you by your health care provider. Make sure you discuss any questions you have with your health care provider. Document Released: 05/01/2011 Document Revised: 04/24/2017 Document Reviewed: 05/05/2016 Elsevier  Patient Education  2020 [employer].

## 2018-12-01 NOTE — Progress Notes (Signed)
Careteam: Patient Care Team: [name], NP as PCP - General (Geriatric Medicine) [name], [place] (Optometry) Despina Hick, MD as Consulting Physician (Ophthalmology) Ernst Bowler Gwenith Daily, MD as Consulting Physician (Allergy and Immunology)  Advanced Directive information    Allergies  Allergen Reactions  . Oxycodone     Had stomach and headache as side effect from medicine  . Sulfur Diarrhea    Chief Complaint  Patient presents with  . Follow-up    2 week blood pressure follow-up  . Medication Management    Stopped Symbicort 1 weeks ago due to possible side effect of elevating b/p      HPI: Patient is a 63 y.o. female seen in the office today to follow up blood pressure. Reports she has tried to make modifications to diet and stopped symbicort to try to get bp down.  Headaches are gone now, blood pressure improved from 180 something to 140 something Elevated today in office.  Taking lisinopril 5 mg daily with triamterene-hctz   symbicort- no worsening cough, congestion or shortness of breath. Plans to follow up with pulmonary doctor regarding this.    Review of Systems:  Review of Systems  Constitutional: Negative for chills, fever, malaise/fatigue and weight loss.  HENT: Negative for tinnitus.   Respiratory: Positive for shortness of breath (occasionally with exertion ). Negative for cough and sputum production.   Cardiovascular: Positive for leg swelling. Negative for chest pain and palpitations.  Gastrointestinal: Negative for abdominal pain, constipation, diarrhea and heartburn.  Genitourinary: Negative for dysuria, frequency and urgency.  Musculoskeletal: Negative for falls.  Skin: Negative.   Neurological: Negative for dizziness and headaches.  Endo/Heme/Allergies: Positive for environmental allergies.  Psychiatric/Behavioral: Negative for depression and memory loss. The patient does not have insomnia.     Past Medical History:  Diagnosis  Date  . Allergic rhinitis, cause unspecified   . Arthritis   . Asthma   . Carpal tunnel syndrome   . Cataract   . Diabetes mellitus without complication (HCC)    diet- controlled  . GERD (gastroesophageal reflux disease)   . Glaucoma   . Hypertension   . Impaired fasting glucose   . Morbid obesity ([place])   . OSA (obstructive sleep apnea) 12/02/2016  . Other malaise and fatigue   . Other specified cardiac dysrhythmias(427.89)   . Pain in joint, site unspecified   . Symptomatic menopausal or female climacteric states    Past Surgical History:  Procedure Laterality Date  . ABDOMINAL HYSTERECTOMY     1994  . carpal tunnel both hands     2012  . CLOSED MANIPULATION SHOULDER Left 04/2014  . EYE SURGERY Left 08/25/2015  . EYE SURGERY Right 09/24/2015  . JOINT REPLACEMENT     both knees replacement, 2010  . mole removed from face    . Nodule removed from back    . SHOULDER SURGERY Left 11/2013  . TONSILLECTOMY     as teenager   Social History:   reports that she has never smoked. She has never used smokeless tobacco. She reports current alcohol use. She reports that she does not use drugs.  Family History  Problem Relation Age of Onset  . Cancer Father   . Diabetes Father   . Allergic rhinitis Son   . Diabetes Brother   . Allergic rhinitis Son   . Diabetes Paternal Aunt   . Lung cancer Cousin   . Asthma Neg Hx     Medications: Patient's Medications  New Prescriptions   No medications on file  Previous Medications   ACCU-CHEK AVIVA PLUS TEST STRIP    CHECK BLOOD SUGAR ONCE DAILY AS INSTRUCTED DX E11.9   ACETAMINOPHEN (TYLENOL) 500 MG TABLET    Take 500 mg by mouth daily.   ALBUTEROL (PROVENTIL HFA;VENTOLIN HFA) 108 (90 BASE) MCG/ACT INHALER    Inhale 2 puffs into the lungs every 4 (four) hours as needed.   AMBULATORY NON FORMULARY MEDICATION    Medication Name: Allergy Injection- once weekly   ASPIRIN 81 MG TABLET    Take 81 mg by mouth daily.   AZELASTINE (ASTELIN)  0.1 % NASAL SPRAY    1-2 SPRAYS IN EACH NOSTRIL TWICE A DAY NASALLY 30 DAYS   B-D ULTRA-FINE 33 LANCETS MISC    Use to test blood sugar once daily. Dx: E11.9   BLOOD GLUCOSE MONITORING SUPPL (PRODIGY AUTOCODE BLOOD GLUCOSE) DEVI    by Does not apply route. Daily as directed   CALCIUM CARBONATE-VITAMIN D (CALTRATE 600+D) 600-400 MG-UNIT PER CHEW TABLET    Chew 1 tablet by mouth daily.    EPIPEN 2-PAK 0.3 MG/0.3ML SOAJ INJECTION       FLUTICASONE (FLONASE) 50 MCG/ACT NASAL SPRAY    Place 1-2 sprays into both nostrils daily.   LEVOCETIRIZINE (XYZAL) 5 MG TABLET    Take 1 tablet (5 mg total) by mouth every evening.   LISINOPRIL (ZESTRIL) 5 MG TABLET    TAKE 1 TABLET BY MOUTH EVERY DAY   LORATADINE (ALAVERT) 10 MG DISSOLVABLE TABLET    Take 10 mg by mouth daily.   OMEPRAZOLE (PRILOSEC) 40 MG CAPSULE    TAKE 1 CAPSULE BY MOUTH EVERY DAY   SIMVASTATIN (ZOCOR) 20 MG TABLET    Take one tablet by mouth once daily at bedtime for cholesterol   TRIAMTERENE-HYDROCHLOROTHIAZIDE (MAXZIDE) 75-50 MG TABLET    TAKE ONE TABLET BY MOUTH ONCE DAILY FOR BLOOD PRESSURE   VITAMIN C (ASCORBIC ACID) 500 MG TABLET    Take 500 mg by mouth every other day.   VITAMIN E (VITAMIN E) 400 UNIT CAPSULE    Take 400 Units by mouth every other day.   Modified Medications   No medications on file  Discontinued Medications   BUDESONIDE-FORMOTEROL (SYMBICORT) 160-4.5 MCG/ACT INHALER    Inhale 2 puffs into the lungs 2 (two) times daily.   MULTIPLE VITAMIN (MULTIVITAMIN) TABLET    Take 1 tablet by mouth every other day.     Physical Exam:  Vitals:   12/01/18 1432  BP: (!) 148/76  Pulse: 67  Temp: 98.9 F (37.2 C)  TempSrc: Oral  SpO2: 97%  Weight: 291 lb (132 kg)  Height: 5\' 8"  (1.727 m)   Body mass index is 44.25 kg/m. Wt Readings from Last 3 Encounters:  12/01/18 291 lb (132 kg)  11/04/18 292 lb (132.5 kg)  08/17/18 298 lb 3.2 oz (135.3 kg)    Physical Exam Constitutional:      Appearance: Normal appearance. She  is well-developed.  HENT:     Mouth/Throat:     Pharynx: No oropharyngeal exudate.  Eyes:     General: No scleral icterus.    Pupils: Pupils are equal, round, and reactive to light.  Neck:     Musculoskeletal: Neck supple.     Thyroid: No thyromegaly.     Vascular: No carotid bruit.     Trachea: No tracheal deviation.  Cardiovascular:     Rate and Rhythm: Normal rate and regular rhythm.  Heart sounds: Murmur (1/6 SEM) present. No friction rub. No gallop.      Comments: +1 pitting LE edema b/l; no calf TTP Pulmonary:     Effort: Pulmonary effort is normal. No respiratory distress.     Breath sounds: Normal breath sounds. No stridor. No wheezing or rales.  Abdominal:     Palpations: Abdomen is not rigid. There is no hepatomegaly.  Lymphadenopathy:     Cervical: No cervical adenopathy.  Skin:    General: Skin is warm and dry.     Findings: No rash.  Neurological:     Mental Status: She is alert and oriented to person, place, and time.     Deep Tendon Reflexes: Reflexes are normal and symmetric.  Psychiatric:        Behavior: Behavior normal.        Thought Content: Thought content normal.        Judgment: Judgment normal.     Labs reviewed: Basic Metabolic Panel: Recent Labs    03/19/18 1040 07/23/18 1045 08/17/18 0934  NA 142 142 142  K 3.7 3.6 3.8  CL 103 103 103  CO2 29 30 31   GLUCOSE 102* 99 101*  BUN 11 12 9   CREATININE 0.81 0.70 0.83  CALCIUM 9.6 9.9 9.9   Liver Function Tests: Recent Labs    03/19/18 1040 07/23/18 1045  AST 14 17  ALT 13 14  BILITOT 0.5 0.5  PROT 6.7 7.1   No results for input(s): LIPASE, AMYLASE in the last 8760 hours. No results for input(s): AMMONIA in the last 8760 hours. CBC: Recent Labs    07/29/18 1443  WBC 8.7  NEUTROABS 5.3  HGB 13.6  HCT 39.9  MCV 82  PLT 398   Lipid Panel: Recent Labs    03/19/18 1040 07/23/18 1045  CHOL 151 155  HDL 51 52  LDLCALC 80 86  TRIG 107 83  CHOLHDL 3.0 3.0   TSH: No  results for input(s): TSH in the last 8760 hours. A1C: Lab Results  Component Value Date   HGBA1C 6.3 (H) 07/23/2018     Assessment/Plan 1. Type 2 diabetes mellitus without complication, without long-term current use of insulin (HCC) -continues to work on diet and has increased physical activity - lisinopril (ZESTRIL) 10 MG tablet; Take 1 tablet (10 mg total) by mouth daily.  Dispense: 30 tablet; Refill: 1 - Amb ref to [facility]  2. Essential hypertension, benign -remains elevated on recheck. Will increase lisinopril to 10 mg daily. Continue on DASH diet. Goal BP <140/90 - lisinopril (ZESTRIL) 10 MG tablet; Take 1 tablet (10 mg total) by mouth daily.  Dispense: 30 tablet; Refill: 1 - Amb ref to [facility]  3. Morbid obesity ([place]) -continues to work on weight loss with diet and exercise, feels like she needs help with nutrition and diet modifications - Amb ref to [facility]  Next appt: 3 months, sooner If needed  Keshara Kiger K. [place], [facility] [phone]

## 2018-12-09 ENCOUNTER — Ambulatory Visit: Payer: Medicare Other | Admitting: Family Medicine

## 2018-12-09 ENCOUNTER — Ambulatory Visit (INDEPENDENT_AMBULATORY_CARE_PROVIDER_SITE_OTHER): Payer: Medicare Other | Admitting: *Deleted

## 2018-12-09 DIAGNOSIS — J309 Allergic rhinitis, unspecified: Secondary | ICD-10-CM

## 2018-12-09 MED ORDER — EPINEPHRINE 0.3 MG/0.3ML IJ SOAJ: 0.3000 mg | Freq: Once | INTRAMUSCULAR | 1 refills | Status: AC

## 2018-12-09 MED ORDER — BUDESONIDE-FORMOTEROL FUMARATE 160-4.5 MCG/ACT IN AERO: 2.0000 | INHALATION_SPRAY | Freq: Two times a day (BID) | RESPIRATORY_TRACT | 5 refills | Status: DC

## 2018-12-09 MED ORDER — ALBUTEROL SULFATE HFA 108 (90 BASE) MCG/ACT IN AERS: 2.0000 | INHALATION_SPRAY | Freq: Four times a day (QID) | RESPIRATORY_TRACT | 1 refills | Status: DC | PRN

## 2018-12-15 ENCOUNTER — Ambulatory Visit (INDEPENDENT_AMBULATORY_CARE_PROVIDER_SITE_OTHER): Payer: Medicare Other | Admitting: *Deleted

## 2018-12-15 DIAGNOSIS — J309 Allergic rhinitis, unspecified: Secondary | ICD-10-CM | POA: Diagnosis not present

## 2018-12-22 ENCOUNTER — Ambulatory Visit (INDEPENDENT_AMBULATORY_CARE_PROVIDER_SITE_OTHER): Payer: Medicare Other

## 2018-12-22 DIAGNOSIS — J309 Allergic rhinitis, unspecified: Secondary | ICD-10-CM | POA: Diagnosis not present

## 2018-12-23 ENCOUNTER — Encounter: Payer: Self-pay | Admitting: Family Medicine

## 2018-12-23 ENCOUNTER — Ambulatory Visit (INDEPENDENT_AMBULATORY_CARE_PROVIDER_SITE_OTHER): Payer: Medicare Other | Admitting: Family Medicine

## 2018-12-23 ENCOUNTER — Other Ambulatory Visit: Payer: Self-pay

## 2018-12-23 VITALS — BP 138/70 | HR 64 | Temp 98.3°F | Ht 68.0 in | Wt 289.4 lb

## 2018-12-23 DIAGNOSIS — E119 Type 2 diabetes mellitus without complications: Secondary | ICD-10-CM

## 2018-12-23 DIAGNOSIS — Z1239 Encounter for other screening for malignant neoplasm of breast: Secondary | ICD-10-CM

## 2018-12-23 NOTE — Progress Notes (Signed)
Cheryl [name] is a 63 y.o. female  Chief Complaint  Patient presents with  . Establish Care    est care/Foot exam- last year/mammogram- has one next year set up medicare only pays every other year/ had medicare annual wellness 6/20    HPI: Cheryl [name] is a 63 y.o. female here to establish care with our office. She is due for A1C as she has DM that is well-controlled. She has no concerns or complaints today. She is feeling well.   Specialists: pulmonary (Dr. Annamaria Boots with [facility]), allergy & immuno (Dr. Nelva Bush), Derm, GYN (Dr. Leo Grosser)  Last CPE, labs: medicare annual wellness in 10/2018, labs in 06/2018 and 07/2018 Last PAP: s/p TAH (non-cancerous etiology) Last mammo: needs referral Last Dexa: 03/2018  Last colonoscopy - [facility] (due in 2021) Last foot exam - 2019  Med refills needed today: none   Past Medical History:  Diagnosis Date  . Allergic rhinitis, cause unspecified   . Arthritis   . Asthma   . Carpal tunnel syndrome   . Cataract   . Diabetes mellitus without complication (HCC)    diet- controlled  . GERD (gastroesophageal reflux disease)   . Glaucoma   . Hypertension   . Impaired fasting glucose   . Morbid obesity ([place])   . OSA (obstructive sleep apnea) 12/02/2016  . Other malaise and fatigue   . Other specified cardiac dysrhythmias(427.89)   . Pain in joint, site unspecified   . Symptomatic menopausal or female climacteric states     Past Surgical History:  Procedure Laterality Date  . ABDOMINAL HYSTERECTOMY     1994  . carpal tunnel both hands     2012  . CLOSED MANIPULATION SHOULDER Left 04/2014  . EYE SURGERY Left 08/25/2015  . EYE SURGERY Right 09/24/2015  . JOINT REPLACEMENT     both knees replacement, 2010  . mole removed from face    . Nodule removed from back    . SHOULDER SURGERY Left 11/2013  . TONSILLECTOMY     as teenager    Social History   Socioeconomic History  . Marital status: Single    Spouse name: Not on file  . Number of  children: Not on file  . Years of education: Not on file  . Highest education level: Not on file  Occupational History  . Not on file  Social Needs  . Financial resource strain: Not hard at all  . Food insecurity    Worry: Never true    Inability: Never true  . Transportation needs    Medical: No    Non-medical: No  Tobacco Use  . Smoking status: Never Smoker  . Smokeless tobacco: Never Used  Substance and Sexual Activity  . Alcohol use: Yes    Comment: Seldom- Wine   . Drug use: No  . 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 Physical activity    Days per week: 0 days    Minutes per session: 0 min  . Stress: To some extent  Relationships  . Social Herbalist on phone: More than three times a week    Gets together: Never    Attends religious service: Never    Active member of club or organization: No    Attends meetings of clubs or organizations: Never    Relationship status: Never married  . Intimate partner violence    Fear of current or ex partner:  No    Emotionally abused: No    Physically abused: No    Forced sexual activity: No  Other Topics Concern  . Not on file  Social History Narrative  . Not on file    Family History  Problem Relation Age of Onset  . Cancer Father   . Diabetes Father   . Allergic rhinitis Son   . Diabetes Brother   . Allergic rhinitis Son   . Diabetes Paternal Aunt   . Lung cancer Cousin   . Asthma Neg Hx      Immunization History  Administered Date(s) Administered  . Influenza Split 02/07/2017  . Influenza,inj,Quad PF,6+ Mos 02/08/2013, 02/23/2015, 02/29/2016  . Influenza-Unspecified 01/24/2014, 02/12/2018  . Pneumococcal Polysaccharide-23 05/03/2013  . Td 08/04/2011  . Zoster 05/26/2014, 02/23/2017  . Zoster Recombinat (Shingrix) 12/21/2016, 06/30/2017    Outpatient Encounter Medications as of 12/23/2018  Medication Sig  . ACCU-CHEK AVIVA PLUS test strip CHECK BLOOD SUGAR ONCE DAILY  AS INSTRUCTED DX E11.9  . acetaminophen (TYLENOL) 500 MG tablet Take 500 mg by mouth daily.  Marland Kitchen albuterol (PROVENTIL HFA;VENTOLIN HFA) 108 (90 Base) MCG/ACT inhaler Inhale 2 puffs into the lungs every 4 (four) hours as needed.  . AMBULATORY NON FORMULARY MEDICATION Medication Name: Allergy Injection- once weekly  . aspirin 81 MG tablet Take 81 mg by mouth daily.  Marland Kitchen azelastine (ASTELIN) 0.1 % nasal spray 1-2 SPRAYS IN EACH NOSTRIL TWICE A DAY NASALLY 30 DAYS  . B-D ULTRA-FINE 33 LANCETS MISC Use to test blood sugar once daily. Dx: E11.9  . Blood Glucose Monitoring Suppl (PRODIGY AUTOCODE BLOOD GLUCOSE) DEVI by Does not apply route. Daily as directed  . budesonide-formoterol (SYMBICORT) 160-4.5 MCG/ACT inhaler Inhale 2 puffs into the lungs 2 (two) times daily.  . Calcium Carbonate-Vitamin D (CALTRATE 600+D) 600-400 MG-UNIT per chew tablet Chew 1 tablet by mouth daily.   Marland Kitchen EPIPEN 2-PAK 0.3 MG/0.3ML SOAJ injection   . fluticasone (FLONASE) 50 MCG/ACT nasal spray Place 1-2 sprays into both nostrils daily.  Marland Kitchen levocetirizine (XYZAL) 5 MG tablet Take 1 tablet (5 mg total) by mouth every evening.  Marland Kitchen lisinopril (ZESTRIL) 10 MG tablet Take 1 tablet (10 mg total) by mouth daily.  Marland Kitchen loratadine (ALAVERT) 10 MG dissolvable tablet Take 10 mg by mouth daily.  Marland Kitchen omeprazole (PRILOSEC) 40 MG capsule TAKE 1 CAPSULE BY MOUTH EVERY DAY  . triamterene-hydrochlorothiazide (MAXZIDE) 75-50 MG tablet TAKE ONE TABLET BY MOUTH ONCE DAILY FOR BLOOD PRESSURE  . vitamin C (ASCORBIC ACID) 500 MG tablet Take 500 mg by mouth every other day.  . vitamin E (VITAMIN E) 400 UNIT capsule Take 400 Units by mouth every other day.   . [DISCONTINUED] simvastatin (ZOCOR) 20 MG tablet Take one tablet by mouth once daily at bedtime for cholesterol  . albuterol (VENTOLIN HFA) 108 (90 Base) MCG/ACT inhaler Inhale 2 puffs into the lungs every 6 (six) hours as needed for wheezing or shortness of breath. (Patient not taking: Reported on 12/23/2018)    No facility-administered encounter medications on file as of 12/23/2018.      ROS: Gen: no fever, chills  Skin: no rash, itching ENT: no ear pain, ear drainage, + allergic rhinitis; no sinus pressure, sore throat Eyes: no blurry vision, double vision Resp: no cough, wheeze,SOB CV: no CP, palpitations, LE edema,  GI: no heartburn, n/v/d/c, abd pain GU: no dysuria, urgency, frequency, hematuria  MSK: + joint pains, no myalgias, back pain Neuro: no dizziness, headache, weakness Psych: no depression, anxiety,  insomnia   Allergies  Allergen Reactions  . Oxycodone     Had stomach and headache as side effect from medicine  . Sulfur Diarrhea    BP 138/70   Pulse 64   Temp 98.3 F (36.8 C) (Oral)   Ht 5\' 8"  (1.727 m)   Wt 289 lb 6.4 oz (131.3 kg)   SpO2 99%   BMI 44.00 kg/m   Physical Exam  Constitutional: She is oriented to person, place, and time. She appears well-developed and well-nourished.  Neck: No JVD present.  Cardiovascular: Normal rate and regular rhythm.  Pulmonary/Chest: Effort normal and breath sounds normal. No respiratory distress.  Musculoskeletal:        General: No edema.  Lymphadenopathy:    She has no cervical adenopathy.  Neurological: She is alert and oriented to person, place, and time.  Skin: Skin is warm and dry.  Psychiatric: She has a normal mood and affect. Her behavior is normal.     A/P:  1. Type 2 diabetes mellitus without complication, without long-term current use of insulin (HCC) - cont current med/treatment regimen - UTD on lipid panel, urine microalbumin/creat, eye exam, foot exam - last A1C = 6.3 in 06/2018 - Hemoglobin A1c  2. Screening for breast cancer - MM DIGITAL SCREENING BILATERAL; Future

## 2018-12-24 LAB — HEMOGLOBIN A1C
Est. average glucose Bld gHb Est-mCnc: 131 mg/dL
Hgb A1c MFr Bld: 6.2 % — ABNORMAL HIGH (ref 4.8–5.6)

## 2018-12-27 ENCOUNTER — Other Ambulatory Visit: Payer: Self-pay | Admitting: Family Medicine

## 2018-12-27 MED ORDER — SIMVASTATIN 20 MG PO TABS: ORAL_TABLET | ORAL | 2 refills | Status: DC

## 2018-12-27 NOTE — Telephone Encounter (Signed)
Completed/thx dmf 

## 2018-12-27 NOTE — Telephone Encounter (Signed)
Medication Refill - Medication:  simvastatin (ZOCOR) 20 MG tablet  Has the patient contacted their pharmacy? Yes advised to call PCP  Preferred Pharmacy (with phone number or street name):  [id number] [address], [place]. [phone] (Phone) [phone] (Fax)   Agent: Please be advised that RX refills may take up to 3 business days. We ask that you follow-up with your pharmacy.

## 2018-12-28 ENCOUNTER — Other Ambulatory Visit: Payer: Self-pay

## 2018-12-28 ENCOUNTER — Encounter: Payer: Self-pay | Admitting: Family Medicine

## 2018-12-28 ENCOUNTER — Encounter: Payer: Medicare Other | Attending: Nurse Practitioner | Admitting: Dietician

## 2018-12-28 ENCOUNTER — Encounter: Payer: Self-pay | Admitting: Dietician

## 2018-12-28 DIAGNOSIS — E119 Type 2 diabetes mellitus without complications: Secondary | ICD-10-CM | POA: Insufficient documentation

## 2018-12-28 NOTE — Patient Instructions (Addendum)
Check your feet daily. Continue to stay active.  Aim for 30 minutes most days.    Continue to be mindful about your meal choices. Great job using the air fryer rather than frying in oil. Continue to choose the leanest meat Continue to increase your non starchy vegetables- 1/2 your plate should be non starchy.

## 2018-12-28 NOTE — Progress Notes (Signed)
Diabetes Self-Management Education  Visit Type: First/Initial  Appt. Start Time: 1000 Appt. End Time: 1130  12/28/2018  [name], identified by name and date of birth, is a 63 y.o. female with a diagnosis of Diabetes: Type 2.   ASSESSMENT History includes HTN, OSA on C-pap, glaucoma, type 2 diabetes "for years".   Recent weight loss from 304 lbs to 290 lbs over the past 1-2 months by walking.  She states that she has lost 40 lbs in the past but regained the weight.  She is trying to focus on behavior change.  Patient lives alone.  She is retired from [employer]. She eats a larger lunch and smaller dinner. She went to classes at [employer] for about one month several years ago.  These discussed plant based eating and diabetes control. Showed patient a podcast to consider and how to listen to this- (plant strong podcast season 2 #28). Need to discuss carbohydrate counting at next visit  Height 5\' 8"  (1.727 m), weight 290 lb (131.5 kg). Body mass index is 44.09 kg/m.  Diabetes Self-Management Education - 12/28/18 1016      Visit Information   Visit Type  First/Initial      Initial Visit   Diabetes Type  Type 2    Are you currently following a meal plan?  No    Are you taking your medications as prescribed?  Not on Medications    Date Diagnosed  several years ago      Health Coping   How would you rate your overall health?  Fair      Psychosocial Assessment   Patient Belief/Attitude about Diabetes  Afraid    Self-care barriers  None    Self-management support  Doctor's office    Other persons present  Patient    Patient Concerns  Nutrition/Meal planning    Special Needs  None    Preferred Learning Style  No preference indicated    Learning Readiness  Ready    How often do you need to have someone help you when you read instructions, pamphlets, or other written materials from your doctor or pharmacy?  1 - Never    What is the last grade level you completed in school?  2 years  college      Pre-Education Assessment   Patient understands the diabetes disease and treatment process.  Needs Review    Patient understands incorporating nutritional management into lifestyle.  Needs Review    Patient undertands incorporating physical activity into lifestyle.  Needs Review    Patient understands using medications safely.  Needs Review    Patient understands monitoring blood glucose, interpreting and using results  Needs Review    Patient understands prevention, detection, and treatment of acute complications.  Needs Review    Patient understands prevention, detection, and treatment of chronic complications.  Needs Review    Patient understands how to develop strategies to address psychosocial issues.  Needs Review    Patient understands how to develop strategies to promote health/change behavior.  Needs Review      Complications   Last HgB A1C per patient/outside source  6.2 %   7/30   How often do you check your blood sugar?  3-4 times / week    Fasting Blood glucose range (mg/dL)  70-129    Number of hypoglycemic episodes per month  0    Number of hyperglycemic episodes per week  0    Have you had a dilated eye exam  in the past 12 months?  Yes    Have you had a dental exam in the past 12 months?  Yes    Are you checking your feet?  No      Dietary Intake   Breakfast  Cereal (frosted flakes or honey nut cheerios), Almond Milk OR occasional biscuit   8-9   Snack (morning)  none    Lunch  2 hot dogs on buns with [place] chilli and baked beans OR salad with chicken (air fryer)   "heavy meal"   Snack (afternoon)  none    Dinner  cabbage, tomatoes OR other vegetables OR salad    Snack (evening)  occasional popcorn or nuts    Beverage(s)  water, V-8 splash, occasional 1/4 cup coffee with silk creamer      Exercise   Exercise Type  Light (walking / raking leaves)    How many days per week to you exercise?  4    How many minutes per day do you exercise?  45    Total  minutes per week of exercise  180      Patient Education   Previous Diabetes Education  Yes (please comment)   classes at [employer] plant based   Disease state   Definition of diabetes, type 1 and 2, and the diagnosis of diabetes    Nutrition management   Role of diet in the treatment of diabetes and the relationship between the three main macronutrients and blood glucose level;Meal options for control of blood glucose level and chronic complications.;Effects of alcohol on blood glucose and safety factors with consumption of alcohol.;Carbohydrate counting    Physical activity and exercise   Role of exercise on diabetes management, blood pressure control and cardiac health.;Helped patient identify appropriate exercises in relation to his/her diabetes, diabetes complications and other health issue.    Monitoring  Daily foot exams;Yearly dilated eye exam;Identified appropriate SMBG and/or A1C goals.;Taught/discussed recording of test results and interpretation of SMBG.    Chronic complications  Relationship between chronic complications and blood glucose control;Dental care    Psychosocial adjustment  Role of stress on diabetes;Worked with patient to identify barriers to care and solutions;Identified and addressed patients feelings and concerns about diabetes      Individualized Goals (developed by patient)   Nutrition  General guidelines for healthy choices and portions discussed    Physical Activity  Exercise 3-5 times per week    Medications  Not Applicable    Monitoring   test my blood glucose as discussed;test blood glucose pre and post meals as discussed    Reducing Risk  examine blood glucose patterns;do foot checks daily;increase portions of healthy fats    Health Coping  discuss diabetes with (comment)      Post-Education Assessment   Patient understands the diabetes disease and treatment process.  Demonstrates understanding / competency    Patient understands incorporating nutritional  management into lifestyle.  Needs Review    Patient undertands incorporating physical activity into lifestyle.  Demonstrates understanding / competency    Patient understands using medications safely.  Demonstrates understanding / competency    Patient understands monitoring blood glucose, interpreting and using results  Demonstrates understanding / competency    Patient understands prevention, detection, and treatment of acute complications.  Demonstrates understanding / competency    Patient understands prevention, detection, and treatment of chronic complications.  Demonstrates understanding / competency    Patient understands how to develop strategies to address psychosocial issues.  Needs Review  Patient understands how to develop strategies to promote health/change behavior.  Needs Review      Outcomes   Expected Outcomes  Demonstrated interest in learning. Expect positive outcomes    Future DMSE  4-6 wks    Program Status  Completed       Individualized Plan for Diabetes Self-Management Training:   Learning Objective:  Patient will have a greater understanding of diabetes self-management. Patient education plan is to attend individual and/or group sessions per assessed needs and concerns.   Plan:   Patient Instructions  Check your feet daily. Continue to stay active.  Aim for 30 minutes most days.    Continue to be mindful about your meal choices. Great job using the air fryer rather than frying in oil. Continue to choose the leanest meat Continue to increase your non starchy vegetables- 1/2 your plate should be non starchy.   Expected Outcomes:  Demonstrated interest in learning. Expect positive outcomes  Education material provided: ADA - How to Thrive: A Guide for Your Journey with Diabetes, Food label handouts, A1C conversion sheet, Meal plan card, My Plate, Snack sheet, Support group flyer, No sodium seasonings and Diabetes Resources, eating out tips for diabetes  If  problems or questions, patient to contact team via:  Phone and Email  Future DSME appointment: 4-6 wks

## 2018-12-29 ENCOUNTER — Ambulatory Visit (INDEPENDENT_AMBULATORY_CARE_PROVIDER_SITE_OTHER): Payer: Medicare Other | Admitting: *Deleted

## 2018-12-29 DIAGNOSIS — J309 Allergic rhinitis, unspecified: Secondary | ICD-10-CM

## 2019-01-04 ENCOUNTER — Ambulatory Visit: Payer: Medicare Other

## 2019-01-05 ENCOUNTER — Ambulatory Visit (INDEPENDENT_AMBULATORY_CARE_PROVIDER_SITE_OTHER): Payer: Medicare Other

## 2019-01-05 DIAGNOSIS — J309 Allergic rhinitis, unspecified: Secondary | ICD-10-CM | POA: Diagnosis not present

## 2019-01-07 ENCOUNTER — Telehealth: Payer: Self-pay | Admitting: Dietician

## 2019-01-07 NOTE — Telephone Encounter (Signed)
Returned patient call requesting that her appointment 02/04/19 at 2:45 be cancelled.  Called patient and left a message that this has been confirmed cancelled and to call for any questions or concerns.  Antonieta Iba, RD, LDN, CDE

## 2019-01-11 ENCOUNTER — Telehealth: Payer: Self-pay | Admitting: Allergy & Immunology

## 2019-01-11 ENCOUNTER — Ambulatory Visit: Payer: Medicare Other

## 2019-01-11 MED ORDER — FLUTICASONE PROPIONATE 50 MCG/ACT NA SUSP: 1.0000 | Freq: Every day | NASAL | 2 refills | Status: DC

## 2019-01-11 NOTE — Telephone Encounter (Signed)
Pt called and [facility] a [facility] [address] [phone].

## 2019-01-12 ENCOUNTER — Ambulatory Visit (INDEPENDENT_AMBULATORY_CARE_PROVIDER_SITE_OTHER): Payer: Medicare Other | Admitting: *Deleted

## 2019-01-12 DIAGNOSIS — J309 Allergic rhinitis, unspecified: Secondary | ICD-10-CM

## 2019-01-19 ENCOUNTER — Ambulatory Visit (INDEPENDENT_AMBULATORY_CARE_PROVIDER_SITE_OTHER): Payer: Medicare Other | Admitting: *Deleted

## 2019-01-19 DIAGNOSIS — J309 Allergic rhinitis, unspecified: Secondary | ICD-10-CM

## 2019-01-24 ENCOUNTER — Other Ambulatory Visit: Payer: Self-pay | Admitting: Nurse Practitioner

## 2019-01-24 DIAGNOSIS — I10 Essential (primary) hypertension: Secondary | ICD-10-CM

## 2019-01-24 DIAGNOSIS — E119 Type 2 diabetes mellitus without complications: Secondary | ICD-10-CM

## 2019-01-26 ENCOUNTER — Ambulatory Visit (INDEPENDENT_AMBULATORY_CARE_PROVIDER_SITE_OTHER): Payer: Medicare Other | Admitting: *Deleted

## 2019-01-26 DIAGNOSIS — J309 Allergic rhinitis, unspecified: Secondary | ICD-10-CM

## 2019-02-02 ENCOUNTER — Ambulatory Visit (INDEPENDENT_AMBULATORY_CARE_PROVIDER_SITE_OTHER): Payer: Medicare Other | Admitting: *Deleted

## 2019-02-02 DIAGNOSIS — E559 Vitamin D deficiency, unspecified: Secondary | ICD-10-CM | POA: Diagnosis not present

## 2019-02-02 DIAGNOSIS — M47816 Spondylosis without myelopathy or radiculopathy, lumbar region: Secondary | ICD-10-CM | POA: Diagnosis not present

## 2019-02-02 DIAGNOSIS — M8580 Other specified disorders of bone density and structure, unspecified site: Secondary | ICD-10-CM | POA: Diagnosis not present

## 2019-02-02 DIAGNOSIS — J309 Allergic rhinitis, unspecified: Secondary | ICD-10-CM | POA: Diagnosis not present

## 2019-02-02 DIAGNOSIS — M25552 Pain in left hip: Secondary | ICD-10-CM | POA: Diagnosis not present

## 2019-02-04 ENCOUNTER — Ambulatory Visit: Payer: Medicare Other | Admitting: Dietician

## 2019-02-07 ENCOUNTER — Encounter: Payer: Self-pay | Admitting: Family Medicine

## 2019-02-07 ENCOUNTER — Encounter: Payer: Self-pay | Admitting: Allergy & Immunology

## 2019-02-09 ENCOUNTER — Ambulatory Visit (INDEPENDENT_AMBULATORY_CARE_PROVIDER_SITE_OTHER): Payer: Medicare Other | Admitting: *Deleted

## 2019-02-09 DIAGNOSIS — J309 Allergic rhinitis, unspecified: Secondary | ICD-10-CM | POA: Diagnosis not present

## 2019-02-10 ENCOUNTER — Other Ambulatory Visit: Payer: Self-pay

## 2019-02-10 ENCOUNTER — Ambulatory Visit (INDEPENDENT_AMBULATORY_CARE_PROVIDER_SITE_OTHER): Payer: Medicare Other | Admitting: Allergy & Immunology

## 2019-02-10 VITALS — BP 118/60 | HR 64 | Resp 16

## 2019-02-10 DIAGNOSIS — J4540 Moderate persistent asthma, uncomplicated: Secondary | ICD-10-CM

## 2019-02-10 DIAGNOSIS — J3089 Other allergic rhinitis: Secondary | ICD-10-CM

## 2019-02-10 NOTE — Progress Notes (Signed)
FOLLOW UP  Date of Service/Encounter:  02/10/19   Assessment:   Moderate persistent asthma - with improved spirometry today  Perennial allergic rhinitis(dust mites, cockroach, ragweed) - on allergen immunotherapy with maintenance reached August 2020  Complex medical history   Cheryl [name] presents for follow-up visit.  She is doing remarkably well.  Her lung testing in fact is completely normal.  This is in direct contrast when we first took over her care and her values were in the 30% range.  We are going to continue with the Symbicort 160/4.5 mcg 2 puffs twice daily.  For her rhinitis, we will keep her on her allergy shots as well as her medications.  I did recommend trying to wean some of these medications.  We can certainly start with the Astelin nose spray, which is perfect to be used on a as needed basis.  She is in agreement with this plan.  Plan/Recommendations:   1. Moderate persistent asthma, uncomplicated - Lung testing looks amazing today and is actually normal. - This is a huge difference compared to your first time I saw you when it was in the 30% range.  - Daily controller medication(s): Symbicort 160/4.41mcg two puffs twice daily with spacer - Prior to physical activity: albuterol 2 puffs 10-15 minutes before physical activity. - Rescue medications: albuterol 4 puffs every 4-6 hours as needed - Asthma control goals:  * Full participation in all desired activities (may need albuterol before activity) * Albuterol use two time or less a week on average (not counting use with activity) * Cough interfering with sleep two time or less a month * Oral steroids no more than once a year * No hospitalizations  2. Perennial allergic rhinitis (dust mites, cockroach) - Continue with allergy shots at the same schedule.  -  Continue with cetirizine 10 mg daily. - Continue with Flonase 1 to 2 sprays per nostril up to twice daily. - Continue with Astelin 1 to 2 sprays per nostril  up to twice daily.  - Consider stopping one of your nose sprays (maybe change the azelastine to as needed).   3. Return in about 6 months (around 08/10/2019). This can be an in-person, a virtual Webex or a telephone follow up visit.  Subjective:   Cheryl [name] is a 63 y.o. female presenting today for follow up of  Chief Complaint  Patient presents with  . Asthma  . Allergies    Cheryl [name] has a history of the following: Patient Active Problem List   Diagnosis Date Noted  . Moderate persistent asthma, uncomplicated [id number]  . OSA (obstructive sleep apnea) 12/02/2016  . Hyperlipidemia LDL goal <100 10/21/2014  . Essential hypertension, benign 10/21/2014  . DM w/o complication type II ([place]) 09/13/2014  . Routine general medical examination at a health care facility 10/12/2013  . 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 05/03/2013  . Need for prophylactic vaccination and inoculation against influenza 02/08/2013  . GERD (gastroesophageal reflux disease) 02/08/2013  . Other and unspecified hyperlipidemia 11/10/2012  . Type II or unspecified type diabetes mellitus without mention of complication, not stated as uncontrolled 10/25/2012  . Impaired fasting glucose 09/07/2012  . Morbid obesity ([place]) 09/07/2012  . Generalized osteoarthrosis, involving multiple sites 09/07/2012  . Carpal tunnel syndrome 09/07/2012  . Other, multiple, and unspecified sites, insect bite, nonvenomous, without mention of infection(919.4) 09/07/2012  . Special screening for malignant neoplasms, colon 09/07/2012  . Other specified cardiac dysrhythmias(427.89) 09/07/2012  . Candidiasis of  vulva and vagina 09/07/2012  . Abdominal pain, generalized 09/07/2012  . Symptomatic menopausal or female climacteric states 09/07/2012  . Essential hypertension 09/07/2012  . Perennial allergic rhinitis 09/07/2012  . Pain in joint, site unspecified 09/07/2012  . Other malaise and fatigue 09/07/2012    History  obtained from: chart review and patient.  Cheryl [name] is a 63 y.o. female presenting for a follow up visit.  She was last seen in clinic in June 2020.  At that time, her lung testing looked excellent.  We continued Symbicort 160/4.5 mcg 2 puffs twice daily with albuterol as needed.  For her allergic rhinitis, we continued with allergy shots as well as cetirizine, Flonase, and Astelin.  Since the last visit, she has done very well.  Asthma/Respiratory Symptom History: She remains on her Symbicort two puffs twice daily with the spacer. She has not needed any ED visits or steroids at all since the last visit. She last used albuterol 2-3 months ago. She has lost weight as well. She does use her CPAP for her OSA. She has used this for two years with good results.   Cheryl [name] is on allergen immunotherapy. She receives one injection. Immunotherapy script #1 contains ragweed, dust mites and cockroach. She currently receives 0.22mL of the RED vial (1/100). She started shots March of 2020 and reached maintenance in August of 2020. She has had no large local reactions.   Allergic Rhinitis Symptom History: She remains on the cetirizine and the fluticasone and azelastine. She has not tried stopping nose sprays. She rarely forgets it, but she can tell the difference especially if she goes outside during the bad season. She has not needed antibiotics at all.   She did fall out of the bed recently. She also broke her tow from all of her walking. She did have osteopenia at one point. When she went to have her bone density performed, her hip density was normal.   Otherwise, there have been no changes to her past medical history, surgical history, family history, or social history.    Review of Systems  Constitutional: Negative.  Negative for chills, fever, malaise/fatigue and weight loss.  HENT: Negative.  Negative for congestion, ear discharge, ear pain and sore throat.   Eyes: Negative for pain, discharge and redness.   Respiratory: Negative for cough, sputum production, shortness of breath and wheezing.   Cardiovascular: Negative.  Negative for chest pain and palpitations.  Gastrointestinal: Negative for abdominal pain, constipation, diarrhea, heartburn, nausea and vomiting.  Skin: Negative.  Negative for itching and rash.  Neurological: Negative for dizziness and headaches.  Endo/Heme/Allergies: Negative for environmental allergies. Does not bruise/bleed easily.       Objective:   Blood pressure 118/60, pulse 64, resp. rate 16, SpO2 98 %. There is no height or weight on file to calculate BMI.   Physical Exam:  Physical Exam  Constitutional: She appears well-developed.  Pleasant female.  HENT:  Head: Normocephalic and atraumatic.  Right Ear: Tympanic membrane, external ear and ear canal normal.  Left Ear: Tympanic membrane, external ear and ear canal normal.  Nose: Mucosal edema and rhinorrhea present. No nasal deformity or septal deviation. No epistaxis. Right sinus exhibits no maxillary sinus tenderness and no frontal sinus tenderness. Left sinus exhibits no maxillary sinus tenderness and no frontal sinus tenderness.  Mouth/Throat: Uvula is midline and oropharynx is clear and moist. Mucous membranes are not pale and not dry.  There is some mild cobblestoning in the posterior oropharynx.  Tonsils are unremarkable  without discharge.  Eyes: Pupils are equal, round, and reactive to light. Conjunctivae and EOM are normal. Right eye exhibits no chemosis and no discharge. Left eye exhibits no chemosis and no discharge. Right conjunctiva is not injected. Left conjunctiva is not injected.  Cardiovascular: Normal rate, regular rhythm and normal heart sounds.  Respiratory: Effort normal and breath sounds normal. No accessory muscle usage. No tachypnea. No respiratory distress. She has no wheezes. She has no rhonchi. She has no rales. She exhibits no tenderness.  Moving air well in all lung fields.   Lymphadenopathy:    She has no cervical adenopathy.  Neurological: She is alert.  Skin: No abrasion, no petechiae and no rash noted. Rash is not papular, not vesicular and not urticarial. No erythema. No pallor.  No eczematous or urticarial lesions noted.  Psychiatric: She has a normal mood and affect.     Diagnostic studies:    Spirometry: results normal (FEV1: 1.92/82%, FVC: 2.41/80%, FEV1/FVC: 80%).    Spirometry consistent with normal pattern.   Allergy Studies: none        Salvatore Marvel, MD  [facility] and [facility]

## 2019-02-10 NOTE — Patient Instructions (Addendum)
1. Moderate persistent asthma, uncomplicated - Lung testing looks amazing today and is actually normal. - This is a huge difference compared to your first time I saw you when it was in the 30% range.  - Daily controller medication(s): Symbicort 160/4.102mcg two puffs twice daily with spacer - Prior to physical activity: albuterol 2 puffs 10-15 minutes before physical activity. - Rescue medications: albuterol 4 puffs every 4-6 hours as needed - Asthma control goals:  * Full participation in all desired activities (may need albuterol before activity) * Albuterol use two time or less a week on average (not counting use with activity) * Cough interfering with sleep two time or less a month * Oral steroids no more than once a year * No hospitalizations  2. Perennial allergic rhinitis (dust mites, cockroach) - Continue with allergy shots at the same schedule.  -  Continue with cetirizine 10 mg daily. - Continue with Flonase 1 to 2 sprays per nostril up to twice daily. - Continue with Astelin 1 to 2 sprays per nostril up to twice daily.  - Consider stopping one of your nose sprays (maybe change the azelastine to as needed).   3. Return in about 6 months (around 08/10/2019). This can be an in-person, a virtual Webex or a telephone follow up visit.   Please inform us of any Emergency Department visits, hospitalizations, or changes in symptoms. Call us before going to the ED for breathing or allergy symptoms since we might be able to fit you in for a sick visit. Feel free to contact us anytime with any questions, problems, or concerns.  It was a pleasure to see you again today!  Websites that have reliable patient information: 1. American Academy of Asthma, Allergy, and Immunology: www.aaaai.org 2. Food Allergy Research and Education (FARE): foodallergy.org 3. Mothers of Asthmatics: http://www.asthmacommunitynetwork.org 4. American College of Allergy, Asthma, and Immunology: www.acaai.org  "Like"  [place] on Facebook and Instagram for our latest updates!      Make sure you are registered to vote! If you have moved or changed any of your contact information, you will need to get this updated before voting!  In some cases, you MAY be able to register to vote online: [email or web address]    Voter ID laws are NOT going into effect for the General Election in November 2020! DO NOT let this stop you from exercising your right to vote!   Absentee voting is the SAFEST way to vote during the coronavirus pandemic!   Download and print an absentee ballot request form at [email or web address] or you can scan the QR code below with your smart phone:      More information on absentee ballots can be found here: [email or web address]  [place] VOTING SITES have been established! You can register to vote and cast your vote on the same day at these locations. See this site for more information on what you need to register to vote: [email or web address]

## 2019-02-11 ENCOUNTER — Encounter: Payer: Self-pay | Admitting: Allergy & Immunology

## 2019-02-16 ENCOUNTER — Ambulatory Visit (INDEPENDENT_AMBULATORY_CARE_PROVIDER_SITE_OTHER): Payer: Medicare Other | Admitting: *Deleted

## 2019-02-16 DIAGNOSIS — J309 Allergic rhinitis, unspecified: Secondary | ICD-10-CM | POA: Diagnosis not present

## 2019-02-23 ENCOUNTER — Ambulatory Visit (INDEPENDENT_AMBULATORY_CARE_PROVIDER_SITE_OTHER): Payer: Medicare Other

## 2019-02-23 DIAGNOSIS — J309 Allergic rhinitis, unspecified: Secondary | ICD-10-CM | POA: Diagnosis not present

## 2019-02-25 ENCOUNTER — Other Ambulatory Visit: Payer: Self-pay

## 2019-02-25 MED ORDER — FLUTICASONE PROPIONATE 50 MCG/ACT NA SUSP: 1.0000 | Freq: Every day | NASAL | 6 refills | Status: DC

## 2019-03-02 ENCOUNTER — Ambulatory Visit (INDEPENDENT_AMBULATORY_CARE_PROVIDER_SITE_OTHER): Payer: Medicare Other | Admitting: *Deleted

## 2019-03-02 DIAGNOSIS — J309 Allergic rhinitis, unspecified: Secondary | ICD-10-CM | POA: Diagnosis not present

## 2019-03-03 ENCOUNTER — Ambulatory Visit: Payer: Medicare Other | Admitting: Nurse Practitioner

## 2019-03-09 ENCOUNTER — Ambulatory Visit (INDEPENDENT_AMBULATORY_CARE_PROVIDER_SITE_OTHER): Payer: Medicare Other

## 2019-03-09 DIAGNOSIS — J309 Allergic rhinitis, unspecified: Secondary | ICD-10-CM

## 2019-03-16 ENCOUNTER — Ambulatory Visit (INDEPENDENT_AMBULATORY_CARE_PROVIDER_SITE_OTHER): Payer: Medicare Other | Admitting: *Deleted

## 2019-03-16 DIAGNOSIS — J309 Allergic rhinitis, unspecified: Secondary | ICD-10-CM | POA: Diagnosis not present

## 2019-03-16 NOTE — Progress Notes (Signed)
VIALS EXP 03-15-20

## 2019-03-17 DIAGNOSIS — J3089 Other allergic rhinitis: Secondary | ICD-10-CM | POA: Diagnosis not present

## 2019-03-19 ENCOUNTER — Other Ambulatory Visit: Payer: Self-pay | Admitting: Nurse Practitioner

## 2019-03-19 DIAGNOSIS — I10 Essential (primary) hypertension: Secondary | ICD-10-CM

## 2019-03-19 DIAGNOSIS — E119 Type 2 diabetes mellitus without complications: Secondary | ICD-10-CM

## 2019-03-23 ENCOUNTER — Ambulatory Visit (INDEPENDENT_AMBULATORY_CARE_PROVIDER_SITE_OTHER): Payer: Medicare Other

## 2019-03-23 DIAGNOSIS — J309 Allergic rhinitis, unspecified: Secondary | ICD-10-CM | POA: Diagnosis not present

## 2019-03-24 DIAGNOSIS — Z1231 Encounter for screening mammogram for malignant neoplasm of breast: Secondary | ICD-10-CM | POA: Diagnosis not present

## 2019-03-25 DIAGNOSIS — Z1272 Encounter for screening for malignant neoplasm of vagina: Secondary | ICD-10-CM | POA: Diagnosis not present

## 2019-03-25 DIAGNOSIS — Z01419 Encounter for gynecological examination (general) (routine) without abnormal findings: Secondary | ICD-10-CM | POA: Diagnosis not present

## 2019-03-30 ENCOUNTER — Ambulatory Visit (INDEPENDENT_AMBULATORY_CARE_PROVIDER_SITE_OTHER): Payer: Medicare Other | Admitting: *Deleted

## 2019-03-30 DIAGNOSIS — J309 Allergic rhinitis, unspecified: Secondary | ICD-10-CM | POA: Diagnosis not present

## 2019-04-06 ENCOUNTER — Other Ambulatory Visit: Payer: Self-pay | Admitting: Allergy & Immunology

## 2019-04-06 ENCOUNTER — Ambulatory Visit (INDEPENDENT_AMBULATORY_CARE_PROVIDER_SITE_OTHER): Payer: Medicare Other | Admitting: *Deleted

## 2019-04-06 DIAGNOSIS — J309 Allergic rhinitis, unspecified: Secondary | ICD-10-CM | POA: Diagnosis not present

## 2019-04-06 DIAGNOSIS — J301 Allergic rhinitis due to pollen: Secondary | ICD-10-CM

## 2019-04-06 NOTE — Telephone Encounter (Signed)
Patient called stating she is in need of a refill on levocetirizine 5MG . Patient stated this would be the first time we are refilling the prescription.   [facility]ses [facility] [address].  Please advise

## 2019-04-06 NOTE — Telephone Encounter (Signed)
Dr. Ernst Bowler, Please advise.

## 2019-04-07 MED ORDER — LEVOCETIRIZINE DIHYDROCHLORIDE 5 MG PO TABS: 5.0000 mg | ORAL_TABLET | Freq: Every evening | ORAL | 1 refills | Status: DC

## 2019-04-07 NOTE — Telephone Encounter (Signed)
I am fine filling that. No problems at all.   Salvatore Marvel, MD Allergy and [facility]

## 2019-04-07 NOTE — Addendum Note (Signed)
Addended by: Lucrezia Starch I on: 04/07/2019 10:17 AM   Modules accepted: Orders

## 2019-04-07 NOTE — Telephone Encounter (Signed)
Prescription refill has been sent in.  

## 2019-04-12 ENCOUNTER — Other Ambulatory Visit: Payer: Self-pay | Admitting: Nurse Practitioner

## 2019-04-12 DIAGNOSIS — E119 Type 2 diabetes mellitus without complications: Secondary | ICD-10-CM

## 2019-04-12 DIAGNOSIS — I10 Essential (primary) hypertension: Secondary | ICD-10-CM

## 2019-04-13 ENCOUNTER — Ambulatory Visit (INDEPENDENT_AMBULATORY_CARE_PROVIDER_SITE_OTHER): Payer: Medicare Other

## 2019-04-13 DIAGNOSIS — J309 Allergic rhinitis, unspecified: Secondary | ICD-10-CM

## 2019-04-18 DIAGNOSIS — Z79899 Other long term (current) drug therapy: Secondary | ICD-10-CM | POA: Diagnosis not present

## 2019-04-18 DIAGNOSIS — J45909 Unspecified asthma, uncomplicated: Secondary | ICD-10-CM | POA: Diagnosis not present

## 2019-04-18 DIAGNOSIS — M1990 Unspecified osteoarthritis, unspecified site: Secondary | ICD-10-CM | POA: Diagnosis not present

## 2019-04-18 DIAGNOSIS — H409 Unspecified glaucoma: Secondary | ICD-10-CM | POA: Diagnosis not present

## 2019-04-18 DIAGNOSIS — E1139 Type 2 diabetes mellitus with other diabetic ophthalmic complication: Secondary | ICD-10-CM | POA: Diagnosis not present

## 2019-04-18 DIAGNOSIS — G4733 Obstructive sleep apnea (adult) (pediatric): Secondary | ICD-10-CM | POA: Diagnosis not present

## 2019-04-18 DIAGNOSIS — I8390 Asymptomatic varicose veins of unspecified lower extremity: Secondary | ICD-10-CM | POA: Diagnosis not present

## 2019-04-18 DIAGNOSIS — I10 Essential (primary) hypertension: Secondary | ICD-10-CM | POA: Diagnosis not present

## 2019-04-18 DIAGNOSIS — E559 Vitamin D deficiency, unspecified: Secondary | ICD-10-CM | POA: Diagnosis not present

## 2019-04-18 DIAGNOSIS — K219 Gastro-esophageal reflux disease without esophagitis: Secondary | ICD-10-CM | POA: Diagnosis not present

## 2019-04-18 DIAGNOSIS — J309 Allergic rhinitis, unspecified: Secondary | ICD-10-CM | POA: Diagnosis not present

## 2019-04-20 ENCOUNTER — Ambulatory Visit (INDEPENDENT_AMBULATORY_CARE_PROVIDER_SITE_OTHER): Payer: Medicare Other | Admitting: *Deleted

## 2019-04-20 DIAGNOSIS — J309 Allergic rhinitis, unspecified: Secondary | ICD-10-CM | POA: Diagnosis not present

## 2019-04-27 ENCOUNTER — Ambulatory Visit (INDEPENDENT_AMBULATORY_CARE_PROVIDER_SITE_OTHER): Payer: Medicare Other

## 2019-04-27 DIAGNOSIS — J309 Allergic rhinitis, unspecified: Secondary | ICD-10-CM | POA: Diagnosis not present

## 2019-04-29 ENCOUNTER — Other Ambulatory Visit: Payer: Self-pay

## 2019-04-29 DIAGNOSIS — Z20822 Contact with and (suspected) exposure to covid-19: Secondary | ICD-10-CM

## 2019-04-30 ENCOUNTER — Other Ambulatory Visit: Payer: Self-pay | Admitting: Nurse Practitioner

## 2019-04-30 DIAGNOSIS — K219 Gastro-esophageal reflux disease without esophagitis: Secondary | ICD-10-CM

## 2019-04-30 LAB — NOVEL CORONAVIRUS, NAA: SARS-CoV-2, NAA: NOT DETECTED

## 2019-05-02 ENCOUNTER — Other Ambulatory Visit: Payer: Self-pay | Admitting: Family Medicine

## 2019-05-02 DIAGNOSIS — K219 Gastro-esophageal reflux disease without esophagitis: Secondary | ICD-10-CM

## 2019-05-03 ENCOUNTER — Ambulatory Visit (INDEPENDENT_AMBULATORY_CARE_PROVIDER_SITE_OTHER): Payer: Medicare Other

## 2019-05-03 DIAGNOSIS — J309 Allergic rhinitis, unspecified: Secondary | ICD-10-CM | POA: Diagnosis not present

## 2019-05-09 DIAGNOSIS — E1139 Type 2 diabetes mellitus with other diabetic ophthalmic complication: Secondary | ICD-10-CM | POA: Diagnosis not present

## 2019-05-09 DIAGNOSIS — I8390 Asymptomatic varicose veins of unspecified lower extremity: Secondary | ICD-10-CM | POA: Diagnosis not present

## 2019-05-09 DIAGNOSIS — J309 Allergic rhinitis, unspecified: Secondary | ICD-10-CM | POA: Diagnosis not present

## 2019-05-09 DIAGNOSIS — I10 Essential (primary) hypertension: Secondary | ICD-10-CM | POA: Diagnosis not present

## 2019-05-09 DIAGNOSIS — Z7189 Other specified counseling: Secondary | ICD-10-CM | POA: Diagnosis not present

## 2019-05-09 DIAGNOSIS — M5136 Other intervertebral disc degeneration, lumbar region: Secondary | ICD-10-CM | POA: Diagnosis not present

## 2019-05-09 DIAGNOSIS — Z1211 Encounter for screening for malignant neoplasm of colon: Secondary | ICD-10-CM | POA: Diagnosis not present

## 2019-05-09 DIAGNOSIS — L84 Corns and callosities: Secondary | ICD-10-CM | POA: Diagnosis not present

## 2019-05-09 DIAGNOSIS — G4733 Obstructive sleep apnea (adult) (pediatric): Secondary | ICD-10-CM | POA: Diagnosis not present

## 2019-05-09 DIAGNOSIS — J45909 Unspecified asthma, uncomplicated: Secondary | ICD-10-CM | POA: Diagnosis not present

## 2019-05-09 DIAGNOSIS — Z0001 Encounter for general adult medical examination with abnormal findings: Secondary | ICD-10-CM | POA: Diagnosis not present

## 2019-05-09 DIAGNOSIS — K219 Gastro-esophageal reflux disease without esophagitis: Secondary | ICD-10-CM | POA: Diagnosis not present

## 2019-05-10 ENCOUNTER — Ambulatory Visit (INDEPENDENT_AMBULATORY_CARE_PROVIDER_SITE_OTHER): Payer: Medicare Other

## 2019-05-10 DIAGNOSIS — J309 Allergic rhinitis, unspecified: Secondary | ICD-10-CM

## 2019-05-10 DIAGNOSIS — Z1211 Encounter for screening for malignant neoplasm of colon: Secondary | ICD-10-CM | POA: Diagnosis not present

## 2019-05-13 ENCOUNTER — Other Ambulatory Visit: Payer: Self-pay | Admitting: Nurse Practitioner

## 2019-05-17 ENCOUNTER — Ambulatory Visit (INDEPENDENT_AMBULATORY_CARE_PROVIDER_SITE_OTHER): Payer: Medicare Other

## 2019-05-17 DIAGNOSIS — J309 Allergic rhinitis, unspecified: Secondary | ICD-10-CM | POA: Diagnosis not present

## 2019-05-24 ENCOUNTER — Ambulatory Visit (INDEPENDENT_AMBULATORY_CARE_PROVIDER_SITE_OTHER): Payer: Medicare Other

## 2019-05-24 DIAGNOSIS — J309 Allergic rhinitis, unspecified: Secondary | ICD-10-CM

## 2019-05-25 ENCOUNTER — Other Ambulatory Visit: Payer: Self-pay | Admitting: Nurse Practitioner

## 2019-05-25 DIAGNOSIS — E119 Type 2 diabetes mellitus without complications: Secondary | ICD-10-CM

## 2019-05-25 DIAGNOSIS — I10 Essential (primary) hypertension: Secondary | ICD-10-CM

## 2019-05-31 ENCOUNTER — Ambulatory Visit (INDEPENDENT_AMBULATORY_CARE_PROVIDER_SITE_OTHER): Payer: Medicare Other

## 2019-05-31 DIAGNOSIS — J309 Allergic rhinitis, unspecified: Secondary | ICD-10-CM

## 2019-06-06 DIAGNOSIS — J3089 Other allergic rhinitis: Secondary | ICD-10-CM | POA: Diagnosis not present

## 2019-06-06 NOTE — Progress Notes (Signed)
Vial exp 06-05-20

## 2019-06-07 ENCOUNTER — Ambulatory Visit (INDEPENDENT_AMBULATORY_CARE_PROVIDER_SITE_OTHER): Payer: Medicare Other

## 2019-06-07 DIAGNOSIS — J309 Allergic rhinitis, unspecified: Secondary | ICD-10-CM | POA: Diagnosis not present

## 2019-06-12 ENCOUNTER — Encounter: Payer: Self-pay | Admitting: Allergy & Immunology

## 2019-06-14 ENCOUNTER — Ambulatory Visit (INDEPENDENT_AMBULATORY_CARE_PROVIDER_SITE_OTHER): Payer: Medicare Other

## 2019-06-14 DIAGNOSIS — J309 Allergic rhinitis, unspecified: Secondary | ICD-10-CM

## 2019-06-21 ENCOUNTER — Ambulatory Visit (INDEPENDENT_AMBULATORY_CARE_PROVIDER_SITE_OTHER): Payer: Medicare Other

## 2019-06-21 DIAGNOSIS — J309 Allergic rhinitis, unspecified: Secondary | ICD-10-CM

## 2019-06-22 DIAGNOSIS — M5416 Radiculopathy, lumbar region: Secondary | ICD-10-CM | POA: Diagnosis not present

## 2019-06-22 DIAGNOSIS — M7062 Trochanteric bursitis, left hip: Secondary | ICD-10-CM | POA: Diagnosis not present

## 2019-06-24 ENCOUNTER — Ambulatory Visit: Payer: Medicare Other

## 2019-06-28 ENCOUNTER — Ambulatory Visit (INDEPENDENT_AMBULATORY_CARE_PROVIDER_SITE_OTHER): Payer: Medicare Other

## 2019-06-28 DIAGNOSIS — J309 Allergic rhinitis, unspecified: Secondary | ICD-10-CM | POA: Diagnosis not present

## 2019-06-29 ENCOUNTER — Ambulatory Visit: Payer: Medicare Other

## 2019-07-01 ENCOUNTER — Other Ambulatory Visit: Payer: Self-pay

## 2019-07-01 DIAGNOSIS — Z20828 Contact with and (suspected) exposure to other viral communicable diseases: Secondary | ICD-10-CM | POA: Diagnosis not present

## 2019-07-01 DIAGNOSIS — Z03818 Encounter for observation for suspected exposure to other biological agents ruled out: Secondary | ICD-10-CM | POA: Diagnosis not present

## 2019-07-01 MED ORDER — BUDESONIDE-FORMOTEROL FUMARATE 160-4.5 MCG/ACT IN AERO: 2.0000 | INHALATION_SPRAY | Freq: Two times a day (BID) | RESPIRATORY_TRACT | 5 refills | Status: DC

## 2019-07-02 ENCOUNTER — Ambulatory Visit: Payer: Medicare Other | Attending: Internal Medicine

## 2019-07-02 DIAGNOSIS — Z23 Encounter for immunization: Secondary | ICD-10-CM | POA: Insufficient documentation

## 2019-07-02 NOTE — Progress Notes (Signed)
   Covid-19 Vaccination Clinic  Name:  Cheryl [name]    MRN: [id number] DOB: 1956/02/05  07/02/2019  Cheryl [name] was observed post Covid-19 immunization for 15 minutes without incidence. She was provided with Vaccine Information Sheet and instruction to access the V-Safe system.   Cheryl [name] was instructed to call 911 with any severe reactions post vaccine: Marland Kitchen Difficulty breathing  . Swelling of your face and throat  . A fast heartbeat  . A bad rash all over your body  . Dizziness and weakness    Immunizations Administered    Name Date Dose VIS Date Route   Pfizer COVID-19 Vaccine 07/02/2019 11:06 AM 0.3 mL 05/06/2019 Intramuscular   Manufacturer: [place]   Lot: [id number]   [place]: [id number]

## 2019-07-05 ENCOUNTER — Ambulatory Visit (INDEPENDENT_AMBULATORY_CARE_PROVIDER_SITE_OTHER): Payer: Medicare Other

## 2019-07-05 DIAGNOSIS — J309 Allergic rhinitis, unspecified: Secondary | ICD-10-CM

## 2019-07-12 ENCOUNTER — Ambulatory Visit (INDEPENDENT_AMBULATORY_CARE_PROVIDER_SITE_OTHER): Payer: Medicare Other

## 2019-07-12 DIAGNOSIS — J309 Allergic rhinitis, unspecified: Secondary | ICD-10-CM | POA: Diagnosis not present

## 2019-07-13 DIAGNOSIS — M7062 Trochanteric bursitis, left hip: Secondary | ICD-10-CM | POA: Diagnosis not present

## 2019-07-13 DIAGNOSIS — M5416 Radiculopathy, lumbar region: Secondary | ICD-10-CM | POA: Diagnosis not present

## 2019-07-17 ENCOUNTER — Other Ambulatory Visit: Payer: Self-pay | Admitting: Nurse Practitioner

## 2019-07-19 ENCOUNTER — Ambulatory Visit (INDEPENDENT_AMBULATORY_CARE_PROVIDER_SITE_OTHER): Payer: Medicare Other

## 2019-07-19 DIAGNOSIS — J309 Allergic rhinitis, unspecified: Secondary | ICD-10-CM

## 2019-07-26 ENCOUNTER — Ambulatory Visit: Payer: Medicare Other

## 2019-07-26 ENCOUNTER — Ambulatory Visit: Payer: Federal, State, Local not specified - PPO | Attending: Internal Medicine

## 2019-07-26 DIAGNOSIS — Z23 Encounter for immunization: Secondary | ICD-10-CM | POA: Insufficient documentation

## 2019-07-26 NOTE — Progress Notes (Signed)
   Covid-19 Vaccination Clinic  Name:  Cheryl [name]    MRN: [id number] DOB: [birth year]  07/26/2019  [name] was observed post Covid-19 immunization for 15 minutes without incident. She was provided with Vaccine Information Sheet and instruction to access the V-Safe system.   [name] was instructed to call 911 with any severe reactions post vaccine: Marland Kitchen Difficulty breathing  . Swelling of face and throat  . A fast heartbeat  . A bad rash all over body  . Dizziness and weakness   Immunizations Administered    Name Date Dose VIS Date Route   Pfizer COVID-19 Vaccine 07/26/2019  3:38 PM 0.3 mL 05/06/2019 Intramuscular   Manufacturer: [place]   Lot: [id number]   [place]: [id number]

## 2019-08-01 DIAGNOSIS — M79603 Pain in arm, unspecified: Secondary | ICD-10-CM | POA: Diagnosis not present

## 2019-08-01 DIAGNOSIS — M5136 Other intervertebral disc degeneration, lumbar region: Secondary | ICD-10-CM | POA: Diagnosis not present

## 2019-08-01 DIAGNOSIS — Z008 Encounter for other general examination: Secondary | ICD-10-CM | POA: Diagnosis not present

## 2019-08-01 DIAGNOSIS — E1139 Type 2 diabetes mellitus with other diabetic ophthalmic complication: Secondary | ICD-10-CM | POA: Diagnosis not present

## 2019-08-01 DIAGNOSIS — Z6841 Body Mass Index (BMI) 40.0 and over, adult: Secondary | ICD-10-CM | POA: Diagnosis not present

## 2019-08-02 ENCOUNTER — Ambulatory Visit (INDEPENDENT_AMBULATORY_CARE_PROVIDER_SITE_OTHER): Payer: Medicare Other

## 2019-08-02 DIAGNOSIS — J309 Allergic rhinitis, unspecified: Secondary | ICD-10-CM | POA: Diagnosis not present

## 2019-08-11 ENCOUNTER — Encounter: Payer: Self-pay | Admitting: Allergy & Immunology

## 2019-08-11 ENCOUNTER — Ambulatory Visit (INDEPENDENT_AMBULATORY_CARE_PROVIDER_SITE_OTHER): Payer: Medicare Other | Admitting: Allergy & Immunology

## 2019-08-11 ENCOUNTER — Other Ambulatory Visit: Payer: Self-pay

## 2019-08-11 VITALS — BP 108/74 | HR 68 | Temp 97.8°F | Resp 18 | Ht 68.0 in | Wt 283.6 lb

## 2019-08-11 DIAGNOSIS — J3089 Other allergic rhinitis: Secondary | ICD-10-CM

## 2019-08-11 DIAGNOSIS — J4540 Moderate persistent asthma, uncomplicated: Secondary | ICD-10-CM | POA: Diagnosis not present

## 2019-08-11 MED ORDER — EPINEPHRINE 0.3 MG/0.3ML IJ SOAJ: 0.3000 mg | INTRAMUSCULAR | 1 refills | Status: DC | PRN

## 2019-08-11 MED ORDER — FLUTICASONE PROPIONATE 50 MCG/ACT NA SUSP: 1.0000 | Freq: Every day | NASAL | 6 refills | Status: DC

## 2019-08-11 MED ORDER — ALBUTEROL SULFATE HFA 108 (90 BASE) MCG/ACT IN AERS: 2.0000 | INHALATION_SPRAY | Freq: Four times a day (QID) | RESPIRATORY_TRACT | 1 refills | Status: DC | PRN

## 2019-08-11 NOTE — Progress Notes (Signed)
FOLLOW UP  Date of Service/Encounter:  08/11/19   Assessment:     Moderate persistent asthma - withimproved spirometry today  Perennial allergic rhinitis(dust mites, cockroach, ragweed) -on allergen immunotherapy with maintenance reached August 2020  Complex medical history  Plan/Recommendations:   1. Moderate persistent asthma, uncomplicated - Lung function looks excellent today. - We are not going to make any medication changes at this time.  - Daily controller medication(s): Symbicort 160/4.51mcg two puffs twice daily with spacer - Prior to physical activity: albuterol 2 puffs 10-15 minutes before physical activity. - Rescue medications: albuterol 4 puffs every 4-6 hours as needed - Asthma control goals:  * Full participation in all desired activities (may need albuterol before activity) * Albuterol use two time or less a week on average (not counting use with activity) * Cough interfering with sleep two time or less a month * Oral steroids no more than once a year * No hospitalizations  2. Perennial allergic rhinitis (dust mites, cockroach) - Continue with allergy shots at the same schedule (every two weeks).  - Try making your medications daily instead of once daily.  -  Continue with cetirizine 10 mg daily. - Continue with Flonase 1 to 2 sprays per nostril up to twice daily. - Continue with Astelin 1 to 2 sprays per nostril up to twice daily.   3. Return in about 6 months (around 02/11/2020). This can be an in-person, a virtual Webex or a telephone follow up visit  Subjective:   Cheryl [name] is a 64 y.o. female presenting today for follow up of  Chief Complaint  Patient presents with  . Asthma    Cheryl [name] has a history of the following: Patient Active Problem List   Diagnosis Date Noted  . Moderate persistent asthma, uncomplicated [id number]  . OSA (obstructive sleep apnea) 12/02/2016  . Hyperlipidemia LDL goal <100 10/21/2014  . Essential  hypertension, benign 10/21/2014  . DM w/o complication type II ([place]) 09/13/2014  . Routine general medical examination at a health care facility 10/12/2013  . 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 05/03/2013  . Need for prophylactic vaccination and inoculation against influenza 02/08/2013  . GERD (gastroesophageal reflux disease) 02/08/2013  . Other and unspecified hyperlipidemia 11/10/2012  . Type II or unspecified type diabetes mellitus without mention of complication, not stated as uncontrolled 10/25/2012  . Impaired fasting glucose 09/07/2012  . Morbid obesity ([place]) 09/07/2012  . Generalized osteoarthrosis, involving multiple sites 09/07/2012  . Carpal tunnel syndrome 09/07/2012  . Other, multiple, and unspecified sites, insect bite, nonvenomous, without mention of infection(919.4) 09/07/2012  . Special screening for malignant neoplasms, colon 09/07/2012  . Other specified cardiac dysrhythmias(427.89) 09/07/2012  . Candidiasis of vulva and vagina 09/07/2012  . Abdominal pain, generalized 09/07/2012  . Symptomatic menopausal or female climacteric states 09/07/2012  . Essential hypertension 09/07/2012  . Perennial allergic rhinitis 09/07/2012  . Pain in joint, site unspecified 09/07/2012  . Other malaise and fatigue 09/07/2012    History obtained from: chart review and patient.  Cheryl [name] is a 64 y.o. female presenting for a follow up visit. She was last seen in September 2020. At that time, she was doing very well. We continued with Symbicort two puffs twice daily as well as albuterol as needed. For her rhinitis, we continued with allergy shots at the same schedule as well as cetirizine 10mg  daily and fluticasone/azelastine one spray per nostril. We did discuss stopping some of her medications since she had been  on allergen immunotherapy for so long.  Since the last visit, she has done fairly well. She did have a bad day earlier this week but she felt better.   Asthma/Respiratory  Symptom History: She remains on the Symbicort two puffs twice daily. She is doing well with this combination of medications. Cheryl [name]'s asthma has been well controlled. She has not required rescue medication, experienced nocturnal awakenings due to lower respiratory symptoms, nor have activities of daily living been limited. She has required no Emergency Department or Urgent Care visits for her asthma. She has required zero courses of systemic steroids for asthma exacerbations since the last visit. ACT score today is 25, indicating excellent asthma symptom control.   Allergic Rhinitis Symptom History: She is doing well with her allergen immunotherapy. She has tolerated her advance without any issues. she is on her antihistamine as well as montelukast and nasal sprays. She has not tried using nasal sprays twice daily.   Cheryl [name] is on allergen immunotherapy. She receives one injection. Immunotherapy script #1 contains ragweed, dust mites and cockroach. She currently receives 0.59mL of the RED vial (1/100). She started shots March of 2020 and reached maintenance in August of 2020. She has had no large local reactions.   She is doing a balance study at [facility]. This consists of monthly visits. She was having some trouble with her back which was leading to the balance issues. Bone density has been more normal.   She recently had her second grandchild. She is thrilled to have two grandchildren now and they live relatively locally. She is retired from [employer] for the past 7 years.   Otherwise, there have been no changes to her past medical history, surgical history, family history, or social history.    Review of Systems  Constitutional: Negative.  Negative for chills, fever, malaise/fatigue and weight loss.  HENT: Negative.  Negative for congestion, ear discharge, ear pain and sore throat.   Eyes: Negative for pain, discharge and redness.  Respiratory: Negative for cough, sputum production, shortness of breath and  wheezing.   Cardiovascular: Negative.  Negative for chest pain and palpitations.  Gastrointestinal: Negative for abdominal pain, constipation, diarrhea, heartburn, nausea and vomiting.  Skin: Negative.  Negative for itching and rash.  Neurological: Negative for dizziness and headaches.  Endo/Heme/Allergies: Negative for environmental allergies. Does not bruise/bleed easily.       Objective:   Blood pressure 108/74, pulse 68, temperature 97.8 F (36.6 C), temperature source Temporal, resp. rate 18, height 5\' 8"  (1.727 m), weight 283 lb 9.6 oz (128.6 kg), SpO2 98 %. Body mass index is 43.12 kg/m.   Physical Exam:  Physical Exam  Constitutional: She appears well-developed.  Pleasant female. Cooperative with the exam.   HENT:  Head: Normocephalic and atraumatic.  Right Ear: Tympanic membrane, external ear and ear canal normal.  Left Ear: Tympanic membrane, external ear and ear canal normal.  Nose: Mucosal edema present. No rhinorrhea, nasal deformity or septal deviation. No epistaxis. Right sinus exhibits no maxillary sinus tenderness and no frontal sinus tenderness. Left sinus exhibits no maxillary sinus tenderness and no frontal sinus tenderness.  Mouth/Throat: Uvula is midline and oropharynx is clear and moist. Mucous membranes are not pale and not dry.  No turbinate  Hypertrophy noted.   Eyes: Pupils are equal, round, and reactive to light. Conjunctivae and EOM are normal. Right eye exhibits no chemosis and no discharge. Left eye exhibits no chemosis and no discharge. Right conjunctiva is not injected. Left conjunctiva is  not injected.  Cardiovascular: Normal rate, regular rhythm and normal heart sounds.  Respiratory: Effort normal and breath sounds normal. No accessory muscle usage. No tachypnea. No respiratory distress. She has no wheezes. She has no rhonchi. She has no rales. She exhibits no tenderness.  Moving air well in all lung fields.   Lymphadenopathy:    She has no  cervical adenopathy.  Neurological: She is alert.  Skin: No abrasion, no petechiae and no rash noted. Rash is not papular, not vesicular and not urticarial. No erythema. No pallor.  Psychiatric: She has a normal mood and affect.     Diagnostic studies:    Spirometry: results normal (FEV1: 2.13/91%, FVC: 2.66/89%, FEV1/FVC: 80%).    Spirometry consistent with normal pattern.   Allergy Studies: none           Salvatore Marvel, MD  [facility]

## 2019-08-11 NOTE — Patient Instructions (Addendum)
1. Moderate persistent asthma, uncomplicated - Lung function looks excellent today. - We are not going to make any medication changes at this time.  - Daily controller medication(s): Symbicort 160/4.36mcg two puffs twice daily with spacer - Prior to physical activity: albuterol 2 puffs 10-15 minutes before physical activity. - Rescue medications: albuterol 4 puffs every 4-6 hours as needed - Asthma control goals:  * Full participation in all desired activities (may need albuterol before activity) * Albuterol use two time or less a week on average (not counting use with activity) * Cough interfering with sleep two time or less a month * Oral steroids no more than once a year * No hospitalizations  2. Perennial allergic rhinitis (dust mites, cockroach) - Continue with allergy shots at the same schedule (every two weeks).  - Try making your medications daily instead of once daily.  -  Continue with cetirizine 10 mg daily. - Continue with Flonase 1 to 2 sprays per nostril up to twice daily. - Continue with Astelin 1 to 2 sprays per nostril up to twice daily.   3. Return in about 6 months (around 02/11/2020). This can be an in-person, a virtual Webex or a telephone follow up visit.   Please inform us of any Emergency Department visits, hospitalizations, or changes in symptoms. Call us before going to the ED for breathing or allergy symptoms since we might be able to fit you in for a sick visit. Feel free to contact us anytime with any questions, problems, or concerns.  It was a pleasure to see you again today!  Websites that have reliable patient information: 1. American Academy of Asthma, Allergy, and Immunology: www.aaaai.org 2. Food Allergy Research and Education (FARE): foodallergy.org 3. Mothers of Asthmatics: http://www.asthmacommunitynetwork.org 4. American College of Allergy, Asthma, and Immunology: www.acaai.org   COVID-19 Vaccine Information can be found at:  [email or web address].co.uk For questions related to vaccine distribution or appointments, please email vaccine@[email or web address] or call [phone].     "Like" [place] on Facebook and Instagram for our latest updates!        Make sure you are registered to vote! If you have moved or changed any of your contact information, you will need to get this updated before voting!  In some cases, you MAY be able to register to vote online: [email or web address]

## 2019-08-16 ENCOUNTER — Ambulatory Visit (INDEPENDENT_AMBULATORY_CARE_PROVIDER_SITE_OTHER): Payer: Medicare Other | Admitting: *Deleted

## 2019-08-16 DIAGNOSIS — J309 Allergic rhinitis, unspecified: Secondary | ICD-10-CM

## 2019-08-24 DIAGNOSIS — L602 Onychogryphosis: Secondary | ICD-10-CM | POA: Diagnosis not present

## 2019-08-24 DIAGNOSIS — L84 Corns and callosities: Secondary | ICD-10-CM | POA: Diagnosis not present

## 2019-08-24 DIAGNOSIS — E1151 Type 2 diabetes mellitus with diabetic peripheral angiopathy without gangrene: Secondary | ICD-10-CM | POA: Diagnosis not present

## 2019-08-30 ENCOUNTER — Ambulatory Visit (INDEPENDENT_AMBULATORY_CARE_PROVIDER_SITE_OTHER): Payer: Medicare Other

## 2019-08-30 DIAGNOSIS — J309 Allergic rhinitis, unspecified: Secondary | ICD-10-CM | POA: Diagnosis not present

## 2019-09-06 NOTE — Progress Notes (Signed)
VIAL EXP 09-05-20

## 2019-09-07 DIAGNOSIS — J3089 Other allergic rhinitis: Secondary | ICD-10-CM | POA: Diagnosis not present

## 2019-09-13 ENCOUNTER — Ambulatory Visit (INDEPENDENT_AMBULATORY_CARE_PROVIDER_SITE_OTHER): Payer: Medicare Other | Admitting: *Deleted

## 2019-09-13 DIAGNOSIS — J309 Allergic rhinitis, unspecified: Secondary | ICD-10-CM | POA: Diagnosis not present

## 2019-09-17 ENCOUNTER — Other Ambulatory Visit: Payer: Self-pay | Admitting: Family Medicine

## 2019-09-19 NOTE — Telephone Encounter (Signed)
MC-Shows someone else as PCP but the system does not recognize that person so I am unable to route it to them/plz see refill req/thx dmf

## 2019-09-21 NOTE — Telephone Encounter (Signed)
Please call pt to let her know we are not able to refill and not able to electronically send request to her PCP. I recommend she contact PCP office to request a refill.

## 2019-09-23 NOTE — Telephone Encounter (Signed)
Asked Pharmacy to "Please send to Novella Rob, FNP" who is pt's PCP/thx dmf

## 2019-09-27 ENCOUNTER — Other Ambulatory Visit: Payer: Self-pay | Admitting: Allergy & Immunology

## 2019-09-27 ENCOUNTER — Ambulatory Visit (INDEPENDENT_AMBULATORY_CARE_PROVIDER_SITE_OTHER): Payer: Medicare Other

## 2019-09-27 DIAGNOSIS — J309 Allergic rhinitis, unspecified: Secondary | ICD-10-CM

## 2019-09-27 DIAGNOSIS — J301 Allergic rhinitis due to pollen: Secondary | ICD-10-CM

## 2019-09-29 DIAGNOSIS — R1032 Left lower quadrant pain: Secondary | ICD-10-CM | POA: Diagnosis not present

## 2019-10-06 ENCOUNTER — Ambulatory Visit (INDEPENDENT_AMBULATORY_CARE_PROVIDER_SITE_OTHER): Payer: Medicare Other | Admitting: Internal Medicine

## 2019-10-06 ENCOUNTER — Encounter: Payer: Self-pay | Admitting: Internal Medicine

## 2019-10-06 ENCOUNTER — Other Ambulatory Visit: Payer: Self-pay

## 2019-10-06 DIAGNOSIS — G4733 Obstructive sleep apnea (adult) (pediatric): Secondary | ICD-10-CM

## 2019-10-06 NOTE — Progress Notes (Signed)
Cheryl [name]    [id number]    [birth year]  [facility], Brayton Layman, DO  HPI female never smoker followed for OSA, complicated by asthma( Dr Vaughan Browner), DM 2, HBP, GERD, allergic rhinitis/allergy vaccine (Dr Donneta Romberg), glaucoma NPSG- 11/25/16- AHI 40.3/hour, desaturation to 88%, body weight 286 pounds  ----------------------------------------------------------------   10/07/2018- Virtual Visit via Telephone Note  History of Present Illness: 10/07/2018- 64 year old female never smoker followed for OSA, complicated by asthma( Dr Vaughan Browner), DM 2, HBP, GERD, allergic rhinitis/allergy vaccine (Dr Donneta Romberg), glaucoma CPAP auto 5-15/ Advanced Download compliance 83%, AHI 4.6/ hr -----OSA on CPAP, no issues w/ pressure settings, some days feels like her mask doesn't fit her Working with allergist . Not sure why sometimes mask doesn't seem to fit as well and she takes it off during the night.    Observations/Objective:   Assessment and Plan: OSA- acceptable compliance and control. Continue auto 5-15 Asthma/ Allergy- followed now by her allergist  Follow Up Instructions: 1 year  --------------------------------  10/05/19- 10/07/2018- 64 year old female never smoker followed for OSA, complicated by asthma( Dr Vaughan Browner), DM 2, HBP, GERD, allergic rhinitis/allergy vaccine , glaucoma CPAP auto 5-15/ Advanced Download compliance 90%, AHI 3.2/ hr Body weight today 286 lbs 2 Phizer Covax Sleeping well with no cocerns.    ROS-see HPI   + = positive Constitutional:    weight loss, night sweats, fevers, chills, fatigue, lassitude. HEENT:    headaches, difficulty swallowing, tooth/dental problems, sore throat,       sneezing, itching, ear ache, nasal congestion, post nasal drip, snoring CV:    chest pain, orthopnea, PND, swelling in lower extremities, anasarca,                                           dizziness, palpitations Resp:   shortness of breath with exertion or at rest.                 productive cough,   non-productive cough, coughing up of blood.              change in color of mucus.  wheezing.   Skin:    rash or lesions. GI:  No-   heartburn, indigestion, abdominal pain, nausea, vomiting, diarrhea,                 change in bowel habits, loss of appetite GU: dysuria, change in color of urine, no urgency or frequency.   flank pain. MS:   joint pain, stiffness, decreased range of motion, back pain. Neuro-     nothing unusual Psych:  change in mood or affect.  depression or anxiety.   memory loss.  OBJ- Physical Exam General- Alert, Oriented, Affect-appropriate, Distress- none acute, + morbidly obese Skin- rash-none, lesions- none, excoriation- none Lymphadenopathy- none Head- atraumatic            Eyes- Gross vision intact, PERRLA, conjunctivae and secretions clear            Ears- Hearing, canals-normal            Nose- Clear, no-Septal dev, mucus, polyps, erosion, perforation             Throat- Mallampati IV , mucosa clear , drainage- none, tonsils- atrophic, + own teeth Neck- flexible , trachea midline, no stridor , thyroid nl, carotid no bruit Chest - symmetrical  excursion , unlabored           Heart/CV- RRR , no murmur , no gallop  , no rub, nl s1 s2                           - JVD- none , edema- none, stasis changes- none, varices- none           Lung- clear to P&A, wheeze- none, cough- none , dullness-none, rub- none           Chest wall-  Abd-  Br/ Gen/ Rectal- Not done, not indicated Extrem- cyanosis- none, clubbing, none, atrophy- none, strength- nl Neuro- grossly intact to observation   10/07/2018- Virtual Visit via Telephone Note  History of Present Illness: 10/07/2018- 64 year old female never smoker followed for OSA, complicated by asthma( Dr Vaughan Browner), DM 2, HBP, GERD, allergic rhinitis/allergy vaccine (Dr Donneta Romberg), glaucoma CPAP auto 5-15/ Advanced Download compliance 83%, AHI 4.6/ hr -----OSA on CPAP, no issues w/ pressure settings,  some days feels like her mask doesn't fit her Working with allergist . Not sure why sometimes mask doesn't seem to fit as well and she takes it off during the night.    Observations/Objective:   Assessment and Plan: OSA- acceptable compliance and control. Continue auto 5-15 Asthma/ Allergy- followed now by her allergist  Follow Up Instructions: 1 year

## 2019-10-06 NOTE — Patient Instructions (Signed)
We can continue CPAP auto 5-15, mask of choice, humidifier, supplies, AirView/ card  Please call if we can help

## 2019-10-11 ENCOUNTER — Ambulatory Visit (INDEPENDENT_AMBULATORY_CARE_PROVIDER_SITE_OTHER): Payer: Medicare Other

## 2019-10-11 DIAGNOSIS — J309 Allergic rhinitis, unspecified: Secondary | ICD-10-CM

## 2019-10-26 ENCOUNTER — Ambulatory Visit (INDEPENDENT_AMBULATORY_CARE_PROVIDER_SITE_OTHER): Payer: Medicare Other

## 2019-10-26 DIAGNOSIS — J309 Allergic rhinitis, unspecified: Secondary | ICD-10-CM | POA: Diagnosis not present

## 2019-11-08 ENCOUNTER — Ambulatory Visit (INDEPENDENT_AMBULATORY_CARE_PROVIDER_SITE_OTHER): Payer: Medicare Other

## 2019-11-08 DIAGNOSIS — J309 Allergic rhinitis, unspecified: Secondary | ICD-10-CM | POA: Diagnosis not present

## 2019-11-15 ENCOUNTER — Ambulatory Visit (INDEPENDENT_AMBULATORY_CARE_PROVIDER_SITE_OTHER): Payer: Medicare Other

## 2019-11-15 DIAGNOSIS — J309 Allergic rhinitis, unspecified: Secondary | ICD-10-CM | POA: Diagnosis not present

## 2019-11-17 ENCOUNTER — Encounter: Payer: Medicare Other | Admitting: Nurse Practitioner

## 2019-11-19 ENCOUNTER — Encounter: Payer: Self-pay | Admitting: Internal Medicine

## 2019-11-19 NOTE — Assessment & Plan Note (Signed)
Plan- encouraged to visualize and commit to life style changes to lose weight- diet/ exercise. Consider [facility] program.

## 2019-11-19 NOTE — Assessment & Plan Note (Signed)
Benefits from CPAP Plan- continue auto 5-15 

## 2019-11-21 ENCOUNTER — Encounter: Payer: Medicare Other | Admitting: Nurse Practitioner

## 2019-11-22 ENCOUNTER — Ambulatory Visit (INDEPENDENT_AMBULATORY_CARE_PROVIDER_SITE_OTHER): Payer: Medicare Other

## 2019-11-22 DIAGNOSIS — J309 Allergic rhinitis, unspecified: Secondary | ICD-10-CM | POA: Diagnosis not present

## 2019-11-24 DIAGNOSIS — M899 Disorder of bone, unspecified: Secondary | ICD-10-CM | POA: Diagnosis not present

## 2019-11-24 DIAGNOSIS — Z79899 Other long term (current) drug therapy: Secondary | ICD-10-CM | POA: Diagnosis not present

## 2019-11-24 DIAGNOSIS — E559 Vitamin D deficiency, unspecified: Secondary | ICD-10-CM | POA: Diagnosis not present

## 2019-11-24 DIAGNOSIS — I10 Essential (primary) hypertension: Secondary | ICD-10-CM | POA: Diagnosis not present

## 2019-11-24 DIAGNOSIS — I8390 Asymptomatic varicose veins of unspecified lower extremity: Secondary | ICD-10-CM | POA: Diagnosis not present

## 2019-11-24 DIAGNOSIS — E1139 Type 2 diabetes mellitus with other diabetic ophthalmic complication: Secondary | ICD-10-CM | POA: Diagnosis not present

## 2019-11-24 DIAGNOSIS — H409 Unspecified glaucoma: Secondary | ICD-10-CM | POA: Diagnosis not present

## 2019-11-24 DIAGNOSIS — J309 Allergic rhinitis, unspecified: Secondary | ICD-10-CM | POA: Diagnosis not present

## 2019-11-24 DIAGNOSIS — G4733 Obstructive sleep apnea (adult) (pediatric): Secondary | ICD-10-CM | POA: Diagnosis not present

## 2019-11-24 DIAGNOSIS — Z0001 Encounter for general adult medical examination with abnormal findings: Secondary | ICD-10-CM | POA: Diagnosis not present

## 2019-11-24 DIAGNOSIS — Z7189 Other specified counseling: Secondary | ICD-10-CM | POA: Diagnosis not present

## 2019-11-24 DIAGNOSIS — J45909 Unspecified asthma, uncomplicated: Secondary | ICD-10-CM | POA: Diagnosis not present

## 2019-11-24 DIAGNOSIS — Z008 Encounter for other general examination: Secondary | ICD-10-CM | POA: Diagnosis not present

## 2019-11-29 ENCOUNTER — Ambulatory Visit (INDEPENDENT_AMBULATORY_CARE_PROVIDER_SITE_OTHER): Payer: Medicare Other

## 2019-11-29 DIAGNOSIS — J309 Allergic rhinitis, unspecified: Secondary | ICD-10-CM | POA: Diagnosis not present

## 2019-12-06 ENCOUNTER — Ambulatory Visit (INDEPENDENT_AMBULATORY_CARE_PROVIDER_SITE_OTHER): Payer: Medicare Other

## 2019-12-06 DIAGNOSIS — J309 Allergic rhinitis, unspecified: Secondary | ICD-10-CM

## 2019-12-09 DIAGNOSIS — R1032 Left lower quadrant pain: Secondary | ICD-10-CM | POA: Diagnosis not present

## 2019-12-09 DIAGNOSIS — K640 First degree hemorrhoids: Secondary | ICD-10-CM | POA: Diagnosis not present

## 2019-12-15 ENCOUNTER — Other Ambulatory Visit: Payer: Self-pay | Admitting: Allergy & Immunology

## 2019-12-15 DIAGNOSIS — J301 Allergic rhinitis due to pollen: Secondary | ICD-10-CM

## 2019-12-19 ENCOUNTER — Other Ambulatory Visit: Payer: Self-pay | Admitting: Allergy & Immunology

## 2019-12-20 ENCOUNTER — Ambulatory Visit (INDEPENDENT_AMBULATORY_CARE_PROVIDER_SITE_OTHER): Payer: Medicare Other

## 2019-12-20 DIAGNOSIS — J309 Allergic rhinitis, unspecified: Secondary | ICD-10-CM

## 2019-12-28 ENCOUNTER — Other Ambulatory Visit: Payer: Self-pay

## 2019-12-28 MED ORDER — BUDESONIDE-FORMOTEROL FUMARATE 160-4.5 MCG/ACT IN AERO: 2.0000 | INHALATION_SPRAY | Freq: Two times a day (BID) | RESPIRATORY_TRACT | 5 refills | Status: DC

## 2019-12-28 MED ORDER — AZELASTINE HCL 0.1 % NA SOLN: NASAL | 2 refills | Status: DC

## 2019-12-29 ENCOUNTER — Other Ambulatory Visit: Payer: Self-pay | Admitting: Allergy & Immunology

## 2019-12-29 DIAGNOSIS — J301 Allergic rhinitis due to pollen: Secondary | ICD-10-CM

## 2019-12-29 NOTE — Telephone Encounter (Signed)
Called the pharmacy and they will fill the generic symbicort and patient has been notified.

## 2019-12-29 NOTE — Telephone Encounter (Signed)
Patient called and would like to know why the name brand was sent in  instead of the generic brand budesonide, it is cheaper. She did not pick up name brand.  And would like to have the generic sent in. To [facility] [address] [phone].

## 2020-01-01 ENCOUNTER — Other Ambulatory Visit: Payer: Self-pay | Admitting: Allergy & Immunology

## 2020-01-01 DIAGNOSIS — J301 Allergic rhinitis due to pollen: Secondary | ICD-10-CM

## 2020-01-03 ENCOUNTER — Ambulatory Visit (INDEPENDENT_AMBULATORY_CARE_PROVIDER_SITE_OTHER): Payer: Medicare Other

## 2020-01-03 DIAGNOSIS — J309 Allergic rhinitis, unspecified: Secondary | ICD-10-CM | POA: Diagnosis not present

## 2020-01-11 DIAGNOSIS — J3089 Other allergic rhinitis: Secondary | ICD-10-CM | POA: Diagnosis not present

## 2020-01-11 NOTE — Progress Notes (Signed)
VIAL EXP 01-10-21 

## 2020-01-24 ENCOUNTER — Ambulatory Visit (INDEPENDENT_AMBULATORY_CARE_PROVIDER_SITE_OTHER): Payer: Medicare Other

## 2020-01-24 DIAGNOSIS — J309 Allergic rhinitis, unspecified: Secondary | ICD-10-CM

## 2020-01-31 DIAGNOSIS — Z20822 Contact with and (suspected) exposure to covid-19: Secondary | ICD-10-CM | POA: Diagnosis not present

## 2020-02-14 ENCOUNTER — Encounter: Payer: Self-pay | Admitting: Allergy & Immunology

## 2020-02-14 ENCOUNTER — Ambulatory Visit (INDEPENDENT_AMBULATORY_CARE_PROVIDER_SITE_OTHER): Payer: Medicare Other | Admitting: Allergy & Immunology

## 2020-02-14 ENCOUNTER — Other Ambulatory Visit: Payer: Self-pay

## 2020-02-14 VITALS — BP 104/70 | HR 68 | Temp 97.2°F | Resp 18 | Wt 283.0 lb

## 2020-02-14 DIAGNOSIS — J4540 Moderate persistent asthma, uncomplicated: Secondary | ICD-10-CM | POA: Diagnosis not present

## 2020-02-14 DIAGNOSIS — J3089 Other allergic rhinitis: Secondary | ICD-10-CM

## 2020-02-14 DIAGNOSIS — J309 Allergic rhinitis, unspecified: Secondary | ICD-10-CM

## 2020-02-14 MED ORDER — ALBUTEROL SULFATE HFA 108 (90 BASE) MCG/ACT IN AERS: 2.0000 | INHALATION_SPRAY | Freq: Four times a day (QID) | RESPIRATORY_TRACT | 1 refills | Status: DC | PRN

## 2020-02-14 MED ORDER — LEVOCETIRIZINE DIHYDROCHLORIDE 5 MG PO TABS
ORAL_TABLET | ORAL | 5 refills | Status: DC
Start: 2020-02-14 — End: 2020-08-08

## 2020-02-14 NOTE — Progress Notes (Signed)
FOLLOW UP  Date of Service/Encounter:  02/14/20   Assessment:   Moderate persistent asthma - withimproved spirometry today  Perennial allergic rhinitis(dust mites, cockroach,ragweed) -on allergen immunotherapywith maintenance reached August 2020  Complex medical history  Fully vaccinated for [place] (April 2021)  Plan/Recommendations:   1. Moderate persistent asthma, uncomplicated - Lung function looks excellent today. - We are not going to make any medication changes at this time.  - Daily controller medication(s): Symbicort 160/4.25mcg two puffs twice daily with spacer - Prior to physical activity: albuterol 2 puffs 10-15 minutes before physical activity. - Rescue medications: albuterol 4 puffs every 4-6 hours as needed - Asthma control goals:  * Full participation in all desired activities (may need albuterol before activity) * Albuterol use two time or less a week on average (not counting use with activity) * Cough interfering with sleep two time or less a month * Oral steroids no more than once a year * No hospitalizations  2. Perennial allergic rhinitis (dust mites, cockroach) - Continue with allergy shots at the same schedule (every two weeks).  - We are going to make your allergy vials stronger with increased ragweed and dust mites.  - Continue with cetirizine 10 mg daily. - Continue with Flonase 1 to 2 sprays per nostril up to twice daily. - Continue with Astelin 1 to 2 sprays per nostril up to twice daily.   3. Return in about 6 months (around 08/13/2020).   Subjective:   Cheryl [name] is a 64 y.o. female presenting today for follow up of  Chief Complaint  Patient presents with  . Follow-up  . Asthma    Cheryl [name] has a history of the following: Patient Active Problem List   Diagnosis Date Noted  . Moderate persistent asthma, uncomplicated 96/22/2979  . OSA (obstructive sleep apnea) 12/02/2016  . Hyperlipidemia LDL goal <100 10/21/2014  .  Essential hypertension, benign 10/21/2014  . DM w/o complication type II ([place]) 09/13/2014  . Routine general medical examination at a health care facility 10/12/2013  . 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 05/03/2013  . Need for prophylactic vaccination and inoculation against influenza 02/08/2013  . GERD (gastroesophageal reflux disease) 02/08/2013  . Other and unspecified hyperlipidemia 11/10/2012  . Type II or unspecified type diabetes mellitus without mention of complication, not stated as uncontrolled 10/25/2012  . Impaired fasting glucose 09/07/2012  . Morbid obesity ([place]) 09/07/2012  . Generalized osteoarthrosis, involving multiple sites 09/07/2012  . Carpal tunnel syndrome 09/07/2012  . Other, multiple, and unspecified sites, insect bite, nonvenomous, without mention of infection(919.4) 09/07/2012  . Special screening for malignant neoplasms, colon 09/07/2012  . Other specified cardiac dysrhythmias(427.89) 09/07/2012  . Candidiasis of vulva and vagina 09/07/2012  . Abdominal pain, generalized 09/07/2012  . Symptomatic menopausal or female climacteric states 09/07/2012  . Essential hypertension 09/07/2012  . Perennial allergic rhinitis 09/07/2012  . Pain in joint, site unspecified 09/07/2012  . Other malaise and fatigue 09/07/2012    History obtained from: chart review and patient.  Cheryl [name] is a 64 y.o. female presenting for a follow up visit. She was last seen in March 2021. At that time, lung function looked great. We did not make any medication changes and continued with Symbicort two puffs BID.    Asthma/Respiratory Symptom History: She remains on the Symbicort two puffs twice daily. She has not needed prednisone at all. Cheryl [name]'s asthma has been well controlled. She has not required rescue medication, experienced nocturnal awakenings due to  lower respiratory symptoms, nor have activities of daily living been limited. She has required no Emergency Department or Urgent Care  visits for her asthma. She has required zero courses of systemic steroids for asthma exacerbations since the last visit. ACT score today is 21, indicating excellent asthma symptom control.   Allergic Rhinitis Symptom History: She remains on the allergy shots. Overall she feels better this year compared to last year. She has been able to pull back on one of the sprays. Since the weather has been bad, she has been taking it. Overall her worse time of the year is fall and spring. She has had some headaches that she attributes to the allergies, but she does not take anything on a routine basis. She feels around the same as she did one year ago. She was not really having any problems.  Review of her vitals show that she receives ragweed mix with dust mite and cockroach.  These were ordered by me, which is odd because usually I usually do not mix cockroach with pollens.  She went to [place] and [place]. She been meaning to go down there to see a very old friend of hers.  They have been friends since third grade and this friend's husband recently passed away.  She seemed to have a really good time with her.  She is fully vaccinated.  She received 2 doses of Moderna.  She has not received a booster yet, as she knows that it was not approved.  Otherwise, there have been no changes to her past medical history, surgical history, family history, or social history.    Review of Systems  Constitutional: Negative.  Negative for chills, fever, malaise/fatigue and weight loss.  HENT: Positive for congestion. Negative for ear discharge, ear pain and sinus pain.   Eyes: Negative for pain, discharge and redness.  Respiratory: Negative for cough, sputum production, shortness of breath and wheezing.   Cardiovascular: Negative.  Negative for chest pain and palpitations.  Gastrointestinal: Negative for abdominal pain, constipation, diarrhea, heartburn, nausea and vomiting.  Skin: Negative.  Negative for itching  and rash.  Neurological: Negative for dizziness and headaches.  Endo/Heme/Allergies: Positive for environmental allergies. Does not bruise/bleed easily.       Objective:   Blood pressure 104/70, pulse 68, temperature (!) 97.2 F (36.2 C), temperature source Temporal, resp. rate 18, weight 283 lb (128.4 kg), SpO2 99 %. Body mass index is 43.03 kg/m.   Physical Exam:  Physical Exam Constitutional:      Appearance: She is well-developed.     Comments: Very pleasant female.  HENT:     Head: Normocephalic and atraumatic.     Right Ear: Tympanic membrane, ear canal and external ear normal.     Left Ear: Tympanic membrane, ear canal and external ear normal.     Nose: No nasal deformity, septal deviation, mucosal edema or rhinorrhea.     Right Turbinates: Enlarged and swollen.     Left Turbinates: Enlarged and swollen.     Right Sinus: No maxillary sinus tenderness or frontal sinus tenderness.     Left Sinus: No maxillary sinus tenderness or frontal sinus tenderness.     Mouth/Throat:     Mouth: Mucous membranes are not pale and not dry.     Pharynx: Uvula midline.  Eyes:     General:        Right eye: No discharge.        Left eye: No discharge.  Conjunctiva/sclera: Conjunctivae normal.     Right eye: Right conjunctiva is not injected. No chemosis.    Left eye: Left conjunctiva is not injected. No chemosis.    Pupils: Pupils are equal, round, and reactive to light.  Cardiovascular:     Rate and Rhythm: Normal rate and regular rhythm.     Heart sounds: Normal heart sounds.  Pulmonary:     Effort: Pulmonary effort is normal. No tachypnea, accessory muscle usage or respiratory distress.     Breath sounds: Normal breath sounds. No wheezing, rhonchi or rales.     Comments: Moving air well in all lung fields. Chest:     Chest wall: No tenderness.  Lymphadenopathy:     Cervical: No cervical adenopathy.  Skin:    Coloration: Skin is not pale.     Findings: No abrasion,  erythema, petechiae or rash. Rash is not papular, urticarial or vesicular.  Neurological:     Mental Status: She is alert.      Diagnostic studies:    Spirometry: results normal (FEV1: 2.00/86%, FVC: 2.46/83%, FEV1/FVC: 81%).    Spirometry consistent with normal pattern.   Allergy Studies: none        Salvatore Marvel, MD  [facility]

## 2020-02-14 NOTE — Patient Instructions (Addendum)
1. Moderate persistent asthma, uncomplicated - Lung function looks excellent today. - We are not going to make any medication changes at this time.  - Daily controller medication(s): Symbicort 160/4.71mcg two puffs twice daily with spacer - Prior to physical activity: albuterol 2 puffs 10-15 minutes before physical activity. - Rescue medications: albuterol 4 puffs every 4-6 hours as needed - Asthma control goals:  * Full participation in all desired activities (may need albuterol before activity) * Albuterol use two time or less a week on average (not counting use with activity) * Cough interfering with sleep two time or less a month * Oral steroids no more than once a year * No hospitalizations  2. Perennial allergic rhinitis (dust mites, cockroach) - Continue with allergy shots at the same schedule (every two weeks).  - We are going to make your allergy vials stronger with increased ragweed and dust mites.  - Continue with cetirizine 10 mg daily. - Continue with Flonase 1 to 2 sprays per nostril up to twice daily. - Continue with Astelin 1 to 2 sprays per nostril up to twice daily.   3. Return in about 6 months (around 08/13/2020).    Please inform us of any Emergency Department visits, hospitalizations, or changes in symptoms. Call us before going to the ED for breathing or allergy symptoms since we might be able to fit you in for a sick visit. Feel free to contact us anytime with any questions, problems, or concerns.  It was a pleasure to see you again today!  Websites that have reliable patient information: 1. American Academy of Asthma, Allergy, and Immunology: www.aaaai.org 2. Food Allergy Research and Education (FARE): foodallergy.org 3. Mothers of Asthmatics: http://www.asthmacommunitynetwork.org 4. American College of Allergy, Asthma, and Immunology: www.acaai.org   COVID-19 Vaccine Information can be found at:  [email or web address].uk For questions related to vaccine distribution or appointments, please email [email or web address] or call [phone].     "Like" [place] on Facebook and Instagram for our latest updates!      Make sure you are registered to vote! If you have moved or changed any of your contact information, you will need to get this updated before voting!  In some cases, you MAY be able to register to vote online: [email or web address]

## 2020-02-16 ENCOUNTER — Encounter (INDEPENDENT_AMBULATORY_CARE_PROVIDER_SITE_OTHER): Payer: Self-pay

## 2020-02-16 ENCOUNTER — Encounter: Payer: Self-pay | Admitting: Allergy & Immunology

## 2020-02-16 DIAGNOSIS — E1151 Type 2 diabetes mellitus with diabetic peripheral angiopathy without gangrene: Secondary | ICD-10-CM | POA: Diagnosis not present

## 2020-02-16 DIAGNOSIS — L602 Onychogryphosis: Secondary | ICD-10-CM | POA: Diagnosis not present

## 2020-02-16 DIAGNOSIS — L84 Corns and callosities: Secondary | ICD-10-CM | POA: Diagnosis not present

## 2020-02-24 DIAGNOSIS — Z23 Encounter for immunization: Secondary | ICD-10-CM | POA: Diagnosis not present

## 2020-02-28 ENCOUNTER — Ambulatory Visit: Payer: Medicare Other

## 2020-02-28 ENCOUNTER — Ambulatory Visit (INDEPENDENT_AMBULATORY_CARE_PROVIDER_SITE_OTHER): Payer: Medicare Other

## 2020-02-28 DIAGNOSIS — J3089 Other allergic rhinitis: Secondary | ICD-10-CM

## 2020-03-01 DIAGNOSIS — J309 Allergic rhinitis, unspecified: Secondary | ICD-10-CM | POA: Diagnosis not present

## 2020-03-01 DIAGNOSIS — Z008 Encounter for other general examination: Secondary | ICD-10-CM | POA: Diagnosis not present

## 2020-03-01 DIAGNOSIS — G4733 Obstructive sleep apnea (adult) (pediatric): Secondary | ICD-10-CM | POA: Diagnosis not present

## 2020-03-01 DIAGNOSIS — Z6841 Body Mass Index (BMI) 40.0 and over, adult: Secondary | ICD-10-CM | POA: Diagnosis not present

## 2020-03-01 DIAGNOSIS — J45909 Unspecified asthma, uncomplicated: Secondary | ICD-10-CM | POA: Diagnosis not present

## 2020-03-01 DIAGNOSIS — E781 Pure hyperglyceridemia: Secondary | ICD-10-CM | POA: Diagnosis not present

## 2020-03-01 DIAGNOSIS — I10 Essential (primary) hypertension: Secondary | ICD-10-CM | POA: Diagnosis not present

## 2020-03-01 DIAGNOSIS — K219 Gastro-esophageal reflux disease without esophagitis: Secondary | ICD-10-CM | POA: Diagnosis not present

## 2020-03-01 DIAGNOSIS — Z0000 Encounter for general adult medical examination without abnormal findings: Secondary | ICD-10-CM | POA: Diagnosis not present

## 2020-03-13 ENCOUNTER — Ambulatory Visit (INDEPENDENT_AMBULATORY_CARE_PROVIDER_SITE_OTHER): Payer: Medicare Other

## 2020-03-13 DIAGNOSIS — J309 Allergic rhinitis, unspecified: Secondary | ICD-10-CM | POA: Diagnosis not present

## 2020-03-20 ENCOUNTER — Ambulatory Visit (INDEPENDENT_AMBULATORY_CARE_PROVIDER_SITE_OTHER): Payer: Medicare Other

## 2020-03-20 DIAGNOSIS — J309 Allergic rhinitis, unspecified: Secondary | ICD-10-CM | POA: Diagnosis not present

## 2020-03-20 DIAGNOSIS — M8580 Other specified disorders of bone density and structure, unspecified site: Secondary | ICD-10-CM | POA: Diagnosis not present

## 2020-03-20 DIAGNOSIS — M8589 Other specified disorders of bone density and structure, multiple sites: Secondary | ICD-10-CM | POA: Diagnosis not present

## 2020-03-25 ENCOUNTER — Other Ambulatory Visit: Payer: Self-pay | Admitting: Allergy & Immunology

## 2020-03-27 ENCOUNTER — Other Ambulatory Visit: Payer: Self-pay

## 2020-03-27 ENCOUNTER — Ambulatory Visit
Admission: RE | Admit: 2020-03-27 | Discharge: 2020-03-27 | Disposition: A | Discharge status: Home / Self Care | Payer: Medicare Other | Source: Ambulatory Visit | Attending: Student | Admitting: Student

## 2020-03-27 ENCOUNTER — Ambulatory Visit (INDEPENDENT_AMBULATORY_CARE_PROVIDER_SITE_OTHER): Payer: Medicare Other

## 2020-03-27 ENCOUNTER — Other Ambulatory Visit: Payer: Self-pay | Admitting: Student

## 2020-03-27 DIAGNOSIS — R222 Localized swelling, mass and lump, trunk: Secondary | ICD-10-CM | POA: Diagnosis not present

## 2020-03-27 DIAGNOSIS — J309 Allergic rhinitis, unspecified: Secondary | ICD-10-CM

## 2020-03-27 DIAGNOSIS — R229 Localized swelling, mass and lump, unspecified: Secondary | ICD-10-CM

## 2020-04-01 ENCOUNTER — Other Ambulatory Visit: Payer: Self-pay | Admitting: Allergy & Immunology

## 2020-04-02 ENCOUNTER — Other Ambulatory Visit: Payer: Self-pay | Admitting: Allergy & Immunology

## 2020-04-03 ENCOUNTER — Ambulatory Visit (INDEPENDENT_AMBULATORY_CARE_PROVIDER_SITE_OTHER): Payer: Medicare Other | Admitting: *Deleted

## 2020-04-03 DIAGNOSIS — J309 Allergic rhinitis, unspecified: Secondary | ICD-10-CM

## 2020-04-03 DIAGNOSIS — Z13820 Encounter for screening for osteoporosis: Secondary | ICD-10-CM | POA: Diagnosis not present

## 2020-04-03 DIAGNOSIS — Z01419 Encounter for gynecological examination (general) (routine) without abnormal findings: Secondary | ICD-10-CM | POA: Diagnosis not present

## 2020-04-03 DIAGNOSIS — Z1211 Encounter for screening for malignant neoplasm of colon: Secondary | ICD-10-CM | POA: Diagnosis not present

## 2020-04-10 ENCOUNTER — Ambulatory Visit (INDEPENDENT_AMBULATORY_CARE_PROVIDER_SITE_OTHER): Payer: Medicare Other

## 2020-04-10 DIAGNOSIS — J309 Allergic rhinitis, unspecified: Secondary | ICD-10-CM | POA: Diagnosis not present

## 2020-04-24 ENCOUNTER — Ambulatory Visit (INDEPENDENT_AMBULATORY_CARE_PROVIDER_SITE_OTHER): Payer: Medicare Other

## 2020-04-24 DIAGNOSIS — J309 Allergic rhinitis, unspecified: Secondary | ICD-10-CM

## 2020-04-26 DIAGNOSIS — L602 Onychogryphosis: Secondary | ICD-10-CM | POA: Diagnosis not present

## 2020-04-26 DIAGNOSIS — E1151 Type 2 diabetes mellitus with diabetic peripheral angiopathy without gangrene: Secondary | ICD-10-CM | POA: Diagnosis not present

## 2020-04-26 DIAGNOSIS — L84 Corns and callosities: Secondary | ICD-10-CM | POA: Diagnosis not present

## 2020-05-08 ENCOUNTER — Ambulatory Visit (INDEPENDENT_AMBULATORY_CARE_PROVIDER_SITE_OTHER): Payer: Medicare Other

## 2020-05-08 DIAGNOSIS — J309 Allergic rhinitis, unspecified: Secondary | ICD-10-CM

## 2020-05-22 ENCOUNTER — Ambulatory Visit (INDEPENDENT_AMBULATORY_CARE_PROVIDER_SITE_OTHER): Payer: Medicare Other | Admitting: *Deleted

## 2020-05-22 DIAGNOSIS — J309 Allergic rhinitis, unspecified: Secondary | ICD-10-CM

## 2020-05-28 NOTE — Progress Notes (Signed)
VIAL EXP 05-29-21 

## 2020-05-29 DIAGNOSIS — J3089 Other allergic rhinitis: Secondary | ICD-10-CM | POA: Diagnosis not present

## 2020-06-05 ENCOUNTER — Ambulatory Visit (INDEPENDENT_AMBULATORY_CARE_PROVIDER_SITE_OTHER): Payer: Medicare Other

## 2020-06-05 DIAGNOSIS — J309 Allergic rhinitis, unspecified: Secondary | ICD-10-CM

## 2020-06-08 DIAGNOSIS — M19011 Primary osteoarthritis, right shoulder: Secondary | ICD-10-CM | POA: Diagnosis not present

## 2020-06-15 DIAGNOSIS — E781 Pure hyperglyceridemia: Secondary | ICD-10-CM | POA: Diagnosis not present

## 2020-06-15 DIAGNOSIS — I10 Essential (primary) hypertension: Secondary | ICD-10-CM | POA: Diagnosis not present

## 2020-06-15 DIAGNOSIS — G4733 Obstructive sleep apnea (adult) (pediatric): Secondary | ICD-10-CM | POA: Diagnosis not present

## 2020-06-15 DIAGNOSIS — Z0000 Encounter for general adult medical examination without abnormal findings: Secondary | ICD-10-CM | POA: Diagnosis not present

## 2020-06-15 DIAGNOSIS — J45909 Unspecified asthma, uncomplicated: Secondary | ICD-10-CM | POA: Diagnosis not present

## 2020-06-15 DIAGNOSIS — K219 Gastro-esophageal reflux disease without esophagitis: Secondary | ICD-10-CM | POA: Diagnosis not present

## 2020-06-15 DIAGNOSIS — Z008 Encounter for other general examination: Secondary | ICD-10-CM | POA: Diagnosis not present

## 2020-06-15 DIAGNOSIS — J309 Allergic rhinitis, unspecified: Secondary | ICD-10-CM | POA: Diagnosis not present

## 2020-06-15 DIAGNOSIS — Z79899 Other long term (current) drug therapy: Secondary | ICD-10-CM | POA: Diagnosis not present

## 2020-06-15 DIAGNOSIS — R609 Edema, unspecified: Secondary | ICD-10-CM | POA: Diagnosis not present

## 2020-06-15 DIAGNOSIS — E1139 Type 2 diabetes mellitus with other diabetic ophthalmic complication: Secondary | ICD-10-CM | POA: Diagnosis not present

## 2020-06-19 ENCOUNTER — Ambulatory Visit (INDEPENDENT_AMBULATORY_CARE_PROVIDER_SITE_OTHER): Payer: Medicare Other

## 2020-06-19 DIAGNOSIS — J309 Allergic rhinitis, unspecified: Secondary | ICD-10-CM

## 2020-06-21 ENCOUNTER — Other Ambulatory Visit: Payer: Self-pay | Admitting: Student

## 2020-06-21 DIAGNOSIS — R600 Localized edema: Secondary | ICD-10-CM

## 2020-06-21 DIAGNOSIS — R52 Pain, unspecified: Secondary | ICD-10-CM

## 2020-06-22 ENCOUNTER — Other Ambulatory Visit: Payer: Self-pay | Admitting: Student

## 2020-06-22 DIAGNOSIS — I82403 Acute embolism and thrombosis of unspecified deep veins of lower extremity, bilateral: Secondary | ICD-10-CM

## 2020-06-22 DIAGNOSIS — R600 Localized edema: Secondary | ICD-10-CM

## 2020-06-22 DIAGNOSIS — R52 Pain, unspecified: Secondary | ICD-10-CM

## 2020-06-25 ENCOUNTER — Other Ambulatory Visit: Payer: Self-pay | Admitting: Student

## 2020-06-25 DIAGNOSIS — R600 Localized edema: Secondary | ICD-10-CM

## 2020-06-25 DIAGNOSIS — R52 Pain, unspecified: Secondary | ICD-10-CM

## 2020-06-26 DIAGNOSIS — M8580 Other specified disorders of bone density and structure, unspecified site: Secondary | ICD-10-CM | POA: Diagnosis not present

## 2020-06-26 DIAGNOSIS — E559 Vitamin D deficiency, unspecified: Secondary | ICD-10-CM | POA: Diagnosis not present

## 2020-06-26 DIAGNOSIS — M19011 Primary osteoarthritis, right shoulder: Secondary | ICD-10-CM | POA: Diagnosis not present

## 2020-06-26 DIAGNOSIS — R5383 Other fatigue: Secondary | ICD-10-CM | POA: Diagnosis not present

## 2020-06-27 ENCOUNTER — Other Ambulatory Visit: Payer: Self-pay | Admitting: Student

## 2020-06-27 DIAGNOSIS — R600 Localized edema: Secondary | ICD-10-CM

## 2020-06-27 DIAGNOSIS — R52 Pain, unspecified: Secondary | ICD-10-CM

## 2020-06-28 DIAGNOSIS — L600 Ingrowing nail: Secondary | ICD-10-CM | POA: Diagnosis not present

## 2020-06-28 DIAGNOSIS — M79672 Pain in left foot: Secondary | ICD-10-CM | POA: Diagnosis not present

## 2020-06-28 DIAGNOSIS — B351 Tinea unguium: Secondary | ICD-10-CM | POA: Diagnosis not present

## 2020-06-28 DIAGNOSIS — M79671 Pain in right foot: Secondary | ICD-10-CM | POA: Diagnosis not present

## 2020-07-02 ENCOUNTER — Ambulatory Visit
Admission: RE | Admit: 2020-07-02 | Discharge: 2020-07-02 | Disposition: A | Discharge status: Home / Self Care | Payer: Medicare Other | Source: Ambulatory Visit | Attending: Student | Admitting: Student

## 2020-07-02 DIAGNOSIS — R52 Pain, unspecified: Secondary | ICD-10-CM

## 2020-07-02 DIAGNOSIS — M79604 Pain in right leg: Secondary | ICD-10-CM | POA: Diagnosis not present

## 2020-07-02 DIAGNOSIS — M79605 Pain in left leg: Secondary | ICD-10-CM | POA: Diagnosis not present

## 2020-07-02 DIAGNOSIS — R600 Localized edema: Secondary | ICD-10-CM

## 2020-07-03 ENCOUNTER — Ambulatory Visit (INDEPENDENT_AMBULATORY_CARE_PROVIDER_SITE_OTHER): Payer: Medicare Other | Admitting: *Deleted

## 2020-07-03 DIAGNOSIS — J309 Allergic rhinitis, unspecified: Secondary | ICD-10-CM | POA: Diagnosis not present

## 2020-07-04 ENCOUNTER — Other Ambulatory Visit: Payer: Medicare Other

## 2020-07-04 DIAGNOSIS — Z20822 Contact with and (suspected) exposure to covid-19: Secondary | ICD-10-CM

## 2020-07-05 LAB — NOVEL CORONAVIRUS, NAA: SARS-CoV-2, NAA: NOT DETECTED

## 2020-07-05 LAB — SARS-COV-2, NAA 2 DAY TAT

## 2020-07-10 ENCOUNTER — Ambulatory Visit
Admission: RE | Admit: 2020-07-10 | Discharge: 2020-07-10 | Disposition: A | Discharge status: Home / Self Care | Payer: Medicare Other | Source: Ambulatory Visit | Attending: Student | Admitting: Student

## 2020-07-10 ENCOUNTER — Other Ambulatory Visit: Payer: Self-pay

## 2020-07-10 DIAGNOSIS — R600 Localized edema: Secondary | ICD-10-CM

## 2020-07-10 DIAGNOSIS — R52 Pain, unspecified: Secondary | ICD-10-CM

## 2020-07-10 DIAGNOSIS — Z96653 Presence of artificial knee joint, bilateral: Secondary | ICD-10-CM | POA: Diagnosis not present

## 2020-07-10 DIAGNOSIS — I83811 Varicose veins of right lower extremities with pain: Secondary | ICD-10-CM | POA: Diagnosis not present

## 2020-07-10 DIAGNOSIS — M79661 Pain in right lower leg: Secondary | ICD-10-CM | POA: Diagnosis not present

## 2020-07-10 DIAGNOSIS — M79605 Pain in left leg: Secondary | ICD-10-CM | POA: Diagnosis not present

## 2020-07-10 DIAGNOSIS — M79604 Pain in right leg: Secondary | ICD-10-CM | POA: Diagnosis not present

## 2020-07-10 DIAGNOSIS — I872 Venous insufficiency (chronic) (peripheral): Secondary | ICD-10-CM | POA: Diagnosis not present

## 2020-07-10 NOTE — Consult Note (Signed)
Chief Complaint: Patient was seen in consultation today for bilateral lower extremity pain and edema.  Referring Physician(s): Stowe,Shanna  History of Present Illness: Cheryl [name] is a 65 y.o. female with longstanding lower extremity pain.  Patient underwent bilateral knee replacements approximately 10 years ago.  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  She was wearing a compression hose while she was working.  She is currently not wearing compression stockings.  She describes the pain in the thigh and posterior calf areas bilaterally.  Lower extremities hurt after she has been standing or walking for long periods of time.  She is able to walk short periods of time without complications.  She feels that some of her problems may also be associated with back pain.  She says the symptoms are worse on the right compared to the left.  She does take Tylenol as needed for the pain.  Past medical history is significant for type 2 diabetes, hypertension, obesity and asthma.  Patient denies bleeding or ulcerations from the lower extremities or feet.  Patient recently had a lower extremity arterial examination that demonstrated normal ankle-brachial indices at rest and post exercise.  Past Medical History:  Diagnosis Date  . Allergic rhinitis, cause unspecified   . Arthritis   . Asthma   . Carpal tunnel syndrome   . Cataract   . Diabetes mellitus without complication (HCC)    diet- controlled  . GERD (gastroesophageal reflux disease)   . Glaucoma   . Hypertension   . Impaired fasting glucose   . Morbid obesity ([place])   . OSA (obstructive sleep apnea) 12/02/2016  . Other malaise and fatigue   . Other specified cardiac dysrhythmias(427.89)   . Pain in joint, site unspecified   . Symptomatic menopausal or female climacteric states     Past Surgical History:  Procedure Laterality Date  . ABDOMINAL HYSTERECTOMY     1994  . carpal tunnel  both hands     2012  . CLOSED MANIPULATION SHOULDER Left 04/2014  . EYE SURGERY Left 08/25/2015  . EYE SURGERY Right 09/24/2015  . JOINT REPLACEMENT     both knees replacement, 2010  . mole removed from face    . Nodule removed from back    . SHOULDER SURGERY Left 11/2013  . TONSILLECTOMY     as teenager    Allergies: Elemental sulfur and Oxycodone  Medications: Prior to Admission medications   Medication Sig Start Date End Date Taking? Authorizing Provider  ACCU-CHEK AVIVA PLUS test strip CHECK BLOOD SUGAR ONCE DAILY AS INSTRUCTED DX E11.9 09/24/15   [name], NP  acetaminophen (TYLENOL) 500 MG tablet Take 500 mg by mouth daily.    [provider]  albuterol (VENTOLIN HFA) 108 (90 Base) MCG/ACT inhaler Inhale 2 puffs into the lungs every 6 (six) hours as needed for wheezing or shortness of breath. 02/14/20   Valentina Shaggy, MD  AMBULATORY NON FORMULARY MEDICATION Medication Name: Allergy Injection- once weekly    [provider]  aspirin 81 MG tablet Take 81 mg by mouth daily.    [provider]  azelastine (ASTELIN) 0.1 % nasal spray 1-2 SPRAYS IN EACH NOSTRIL TWICE A DAY 04/02/20   Valentina Shaggy, MD  B-D ULTRA-FINE 33 LANCETS MISC Use to test blood sugar once daily. Dx: E11.9 09/07/18   [name], NP  Blood Glucose Monitoring Suppl (PRODIGY AUTOCODE BLOOD  GLUCOSE) DEVI by Does not apply route. Daily as directed    [provider]  budesonide-formoterol (SYMBICORT) 160-4.5 MCG/ACT inhaler TAKE 2 PUFFS BY MOUTH TWICE A DAY 04/02/20   Valentina Shaggy, MD  Calcium Carbonate-Vitamin D (CALTRATE 600+D) 600-400 MG-UNIT per chew tablet Chew 1 tablet by mouth daily.     [provider]  EPINEPHrine (EPIPEN 2-PAK) 0.3 mg/0.3 mL IJ SOAJ injection Inject 0.3 mLs (0.3 mg total) into the muscle as needed for anaphylaxis. 08/11/19   Valentina Shaggy, MD  fluticasone (FLONASE) 50 MCG/ACT nasal spray PLACE 1-2 SPRAYS  INTO BOTH NOSTRILS DAILY. 03/26/20   Valentina Shaggy, MD  levocetirizine (XYZAL) 5 MG tablet TAKE 1 TABLET BY MOUTH EVERY DAY IN THE EVENING 02/14/20   Valentina Shaggy, MD  lisinopril (ZESTRIL) 10 MG tablet TAKE 1 TABLET BY MOUTH EVERY DAY 03/21/19   [name], NP  nabumetone (RELAFEN) 500 MG tablet Take 500 mg by mouth 2 (two) times daily as needed. 08/01/19   [provider]  omeprazole (PRILOSEC) 40 MG capsule TAKE 1 CAPSULE BY MOUTH EVERY DAY 05/02/19   Ronnald Nian, DO  Pediatric Multivitamins-Fl (MULTIPLE VITAMINS/FLUORIDE) 1 MG CHEW     [provider]  simvastatin (ZOCOR) 20 MG tablet Take one tablet by mouth once daily at bedtime for cholesterol 12/27/18   Cirigliano, Mary K, DO  triamterene-hydrochlorothiazide (MAXZIDE) 75-50 MG tablet TAKE ONE TABLET BY MOUTH ONCE DAILY FOR BLOOD PRESSURE 11/23/18   [name], NP  vitamin C (ASCORBIC ACID) 500 MG tablet Take 500 mg by mouth every other day.    [provider]  vitamin E (VITAMIN E) 400 UNIT capsule Take 400 Units by mouth every other day.     [provider]     Family History  Problem Relation Age of Onset  . Cancer Father   . Diabetes Father   . Allergic rhinitis Son   . Diabetes Brother   . Allergic rhinitis Son   . Diabetes Paternal Aunt   . Lung cancer Cousin   . Asthma Neg Hx     Social History   Socioeconomic History  . Marital status: Single    Spouse name: Not on file  . Number of children: Not on file  . Years of education: Not on file  . Highest education level: Not on file  Occupational History  . Not on file  Tobacco Use  . Smoking status: Never Smoker  . Smokeless tobacco: Never Used  Vaping Use  . Vaping Use: Never used  Substance and Sexual Activity  . Alcohol use: Yes    Comment: Seldom- Wine   . Drug use: No  . Sexual activity: Never    Comment: post office worker  Other Topics Concern  . Not on file  Social History Narrative   . Not on file   Social Determinants of Health   Financial Resource Strain: Not on file  Food Insecurity: Not on file  Transportation Needs: Not on file  Physical Activity: Not on file  Stress: Not on file  Social Connections: Not on file    Review of Systems  Constitutional: Positive for activity change.  Cardiovascular: Positive for leg swelling.  Musculoskeletal: Positive for back pain.    Vital Signs: There were no vitals taken for this visit.  Physical Exam Constitutional:      Appearance: Normal appearance. She is obese.  Musculoskeletal:     Comments: Right lower extremity: Mild  swelling in the right ankle and foot.  Faint right pedal pulses.  Visible and palpable varicosities along the medial right knee and medial proximal right calf.  Surgical scar along the anterior knee from previous knee replacement.  No areas of ulceration or erythema.  Left lower extremity: Mild swelling in the left ankle and foot.  Faint left pedal pulses.  No visible varicosities.  Surgical scar along the anterior knee from previous knee replacement.  No ulcerations or erythema.  Neurological:     Mental Status: She is alert.          Imaging: US Venous Img Lower Bilateral (DVT)  Result Date: 07/10/2020 CLINICAL DATA:  Bilateral lower extremity edema and pain. Evaluate for DVT and venous insufficiency. EXAM: BILATERAL LOWER EXTREMITY VENOUS DUPLEX ULTRASOUND TECHNIQUE: Gray-scale sonography with graded compression, as well as color Doppler and duplex ultrasound, were performed to evaluate the deep and superficial veins of both lower extremities. Spectral Doppler was utilized to evaluate flow at rest and with distal augmentation maneuvers. A complete superficial venous insufficiency exam was performed in the upright standing position. I personally performed the technical portion of the exam. COMPARISON:  Lower extremity arterial examination 07/02/2020 FINDINGS: Right lower extremity: Deep  venous system: Normal compressibility, augmentation and color Doppler flow in the right common femoral vein, right femoral vein and right popliteal vein without thrombus. The right saphenofemoral junction is patent. Right profunda femoral vein is patent without thrombus. Superficial venous system: Reflux at the right saphenofemoral junction measures 1.9 seconds. Reflux in the proximal thigh right GSV measures 2.4 seconds. Right thigh proximal thigh GSV measures up to 6 mm in diameter. Right GSV in the mid thigh has 1.5 seconds of reflux. Right GSV in the distal thigh has 2.7 seconds of reflux. Right GSV at the knee has 2.1 seconds of reflux. There are varicosities coming off the right GSV in the medial right knee and medial right upper calf. Right GSV in the mid calf has 2.9 seconds of reflux. No reflux associated with the right short saphenous vein. Left lower extremity: Deep venous system: Normal compressibility, augmentation and color Doppler flow in the left common femoral vein, left femoral vein and left popliteal vein without thrombus. Left profunda femoral vein is patent without thrombus. The left saphenofemoral junction is patent. Superficial venous system: No reflux at the left saphenofemoral junction. No reflux identified in the left GSV within the thigh. The left GSV in the proximal thigh measures 4 mm in diameter. Left GSV in the proximal calf has 2.9 seconds of reflux. Left GSV in the mid calf has 4.6 seconds of reflux. Small varicosities in the medial left calf. No significant reflux in the left short saphenous vein. IMPRESSION: 1. Positive for superficial venous insufficiency in the right thigh and right calf GSV with varicosities along the medial aspect of the right knee and right proximal calf. 2. Positive for superficial venous insufficiency in the left GSV within the left proximal and mid calf. 3.  No evidence of deep venous thrombosis in the lower extremities. Electronically Signed   By: Markus Daft M.D.   On: 07/10/2020 13:43   US ARTERIAL SEGMENTAL EXERCISE (USE FOR CLAUDICATION)  Result Date: 07/02/2020 CLINICAL DATA:  Lower extremity pain, erythema and hypertrophic nails concerning for underlying peripheral arterial disease. EXAM: NONINVASIVE PHYSIOLOGIC VASCULAR STUDY OF BILATERAL LOWER EXTREMITIES WITH AND WITHOUT EXERCISE TECHNIQUE: Evaluation of both lower extremities were performed at rest, including calculation of ankle-brachial indices, multiple segmental pressure evaluation, segmental  Doppler and segmental pulse volume recording. Ankle brachial indices were also obtained following treadmill exercise. FINDINGS: Right Lower Extremity Resting ABI:  1.1 Resting TBI: 0.64 Post Exercise ABI:1.2 Segmental Pressures: Normal segmental pressures, no significant (20 mmHg) pressure gradient between adjacent segments. Great toe pressure: 80 mm Hg Arterial Waveforms: Normal tri-phasic arterial waveforms. PVRs: Normal PVRs with maintained waveform amplitude, augmentation and quality. Left Lower Extremity: Resting ABI: 1.1 Resting TBI: 0.64 Post Exercise ABI:1.2 Segmental Pressures: Normal segmental pressures, no significant (20 mmHg) pressure gradient between adjacent segments. Great toe pressure: 81 mm Hg Arterial Waveforms: Normal tri-phasic arterial waveforms. PVRs: Normal PVRs with maintained waveform amplitude, augmentation and quality. Other: Symmetric upper extremity pressures. Ankle Brachial index > 1.4 Non diagnostic secondary to incompressible vessel calcifications 1.0-1.4       Normal 0.9-0.99     Borderline PAD 0.8-0.89     Mild PAD 0.5-0.79     Moderate PAD < 0.5          Severe PAD Toe Brachial Index Normal     >0.65 Moderate  0.53-0.64 Severe     <0.23 Toe Pressures Absolute toe pressure >32mmHg sufficient for wound healing. Toe pressures <53mmHg = critical limb ischemia. IMPRESSION: 1. Normal resting and post exercise ankle-brachial indices. No evidence of hemodynamically significant  stenosis to the level of the ankles bilaterally. 2. Borderline abnormal toe brachial indices suggesting borderline small vessels disease. Signed, Criselda Peaches, MD, [facility] Radiology Electronically Signed   By: Jacqulynn Cadet M.D.   On: 07/02/2020 15:46   [place] RAD EVAL AND MGMT  Result Date: 07/10/2020 Please refer to "Notes" to see consult details.   Labs:  CBC: No results for input(s): WBC, HGB, HCT, PLT in the last 8760 hours.  COAGS: No results for input(s): INR, APTT in the last 8760 hours.  BMP: No results for input(s): NA, K, CL, CO2, GLUCOSE, BUN, CALCIUM, CREATININE, GFRNONAA, GFRAA in the last 8760 hours.  Invalid input(s): CMP  LIVER FUNCTION TESTS: No results for input(s): BILITOT, AST, ALT, ALKPHOS, PROT, ALBUMIN in the last 8760 hours.  TUMOR MARKERS: No results for input(s): AFPTM, CEA, CA199, CHROMGRNA in the last 8760 hours.  Assessment and Plan:  65 year old with bilateral lower extremity superficial venous insufficiency involving the bilateral great saphenous veins.  Patient complains of longstanding pain and swelling in both lower extremities.  Physical exam and ultrasound findings demonstrate superficial venous insufficiency involving the right great saphenous vein in the right thigh and right calf.  There are varicosities in the right medial knee and proximal calf region.  There is also venous insufficiency involving the left great saphenous vein in the left proximal calf and left mid calf.  Superficial venous insufficiency could be contributing to patient's lower extremity pain particularly in the right lower extremity where the disease is more diffuse.  Patient symptoms are likely multifactorial from venous insufficiency, back pain, obesity and previous knee replacements.  However, I do think the patient would benefit from endovascular ablation of the great saphenous veins but the patient is  hesitant to have any procedures at this time.  Recommend conservative therapy with thigh-high compression stockings, measuring 20 to 30 mmHg.  Plan to see the patient back in 3 to 4 months and assess symptoms after conservative therapy.  If the patient continues to be symptomatic and would like to pursue additional treatment, I think she would be a candidate for endovascular laser treatment of bilateral great saphenous veins and may  need ultrasound-guided foam sclerotherapy of the right lower extremity varicose veins.  Thank you for this interesting consult.  I greatly enjoyed meeting Bernadene C Hellums and look forward to participating in their care.  A copy of this report was sent to the requesting provider on this date.  Electronically Signed: Burman Riis 07/10/2020, 2:02 PM   I spent a total of  30 Minutes  in face to face in clinical consultation, greater than 50% of which was counseling/coordinating care for lower extremity venous insufficiency.

## 2020-07-21 ENCOUNTER — Other Ambulatory Visit: Payer: Self-pay | Admitting: Allergy & Immunology

## 2020-07-24 ENCOUNTER — Ambulatory Visit (INDEPENDENT_AMBULATORY_CARE_PROVIDER_SITE_OTHER): Payer: Medicare Other

## 2020-07-24 DIAGNOSIS — J309 Allergic rhinitis, unspecified: Secondary | ICD-10-CM

## 2020-07-26 DIAGNOSIS — B351 Tinea unguium: Secondary | ICD-10-CM | POA: Diagnosis not present

## 2020-07-26 DIAGNOSIS — M79675 Pain in left toe(s): Secondary | ICD-10-CM | POA: Diagnosis not present

## 2020-07-26 DIAGNOSIS — M79674 Pain in right toe(s): Secondary | ICD-10-CM | POA: Diagnosis not present

## 2020-07-26 DIAGNOSIS — L600 Ingrowing nail: Secondary | ICD-10-CM | POA: Diagnosis not present

## 2020-07-31 ENCOUNTER — Ambulatory Visit (INDEPENDENT_AMBULATORY_CARE_PROVIDER_SITE_OTHER): Payer: Medicare Other | Admitting: *Deleted

## 2020-07-31 DIAGNOSIS — J309 Allergic rhinitis, unspecified: Secondary | ICD-10-CM | POA: Diagnosis not present

## 2020-08-07 ENCOUNTER — Ambulatory Visit (INDEPENDENT_AMBULATORY_CARE_PROVIDER_SITE_OTHER): Payer: Medicare Other | Admitting: *Deleted

## 2020-08-07 DIAGNOSIS — J309 Allergic rhinitis, unspecified: Secondary | ICD-10-CM | POA: Diagnosis not present

## 2020-08-08 ENCOUNTER — Other Ambulatory Visit: Payer: Self-pay | Admitting: Allergy & Immunology

## 2020-08-13 ENCOUNTER — Other Ambulatory Visit: Payer: Self-pay | Admitting: Allergy & Immunology

## 2020-08-14 ENCOUNTER — Ambulatory Visit (INDEPENDENT_AMBULATORY_CARE_PROVIDER_SITE_OTHER): Payer: Medicare Other | Admitting: Allergy & Immunology

## 2020-08-14 ENCOUNTER — Ambulatory Visit: Payer: Self-pay | Admitting: *Deleted

## 2020-08-14 ENCOUNTER — Other Ambulatory Visit: Payer: Self-pay

## 2020-08-14 VITALS — BP 132/80 | HR 64 | Temp 98.3°F | Resp 18 | Ht 68.0 in | Wt 285.8 lb

## 2020-08-14 DIAGNOSIS — J309 Allergic rhinitis, unspecified: Secondary | ICD-10-CM

## 2020-08-14 DIAGNOSIS — J4540 Moderate persistent asthma, uncomplicated: Secondary | ICD-10-CM

## 2020-08-14 DIAGNOSIS — J3089 Other allergic rhinitis: Secondary | ICD-10-CM

## 2020-08-14 MED ORDER — BUDESONIDE-FORMOTEROL FUMARATE 160-4.5 MCG/ACT IN AERO: INHALATION_SPRAY | RESPIRATORY_TRACT | 0 refills | Status: DC

## 2020-08-14 MED ORDER — EPINEPHRINE 0.3 MG/0.3ML IJ SOAJ: 0.3000 mg | INTRAMUSCULAR | 1 refills | Status: DC | PRN

## 2020-08-14 MED ORDER — ALBUTEROL SULFATE HFA 108 (90 BASE) MCG/ACT IN AERS
2.0000 | INHALATION_SPRAY | Freq: Four times a day (QID) | RESPIRATORY_TRACT | 1 refills | Status: DC | PRN
Start: 2020-08-14 — End: 2021-02-20

## 2020-08-14 NOTE — Patient Instructions (Addendum)
1. Moderate persistent asthma, uncomplicated - Lung function looks excellent today. - We are not going to make any medication changes at this time.  - Try walking with a mask OR walking inside.  - Daily controller medication(s): Symbicort 160/4.59mcg two puffs twice daily with spacer - Prior to physical activity: albuterol 2 puffs 10-15 minutes before physical activity. - Rescue medications: albuterol 4 puffs every 4-6 hours as needed - Asthma control goals:  * Full participation in all desired activities (may need albuterol before activity) * Albuterol use two time or less a week on average (not counting use with activity) * Cough interfering with sleep two time or less a month * Oral steroids no more than once a year * No hospitalizations  2. Perennial allergic rhinitis (dust mites, cockroach) - Continue with allergy shots at the same schedule (every two weeks).  - Continue with cetirizine 10 mg daily. - Continue with Flonase 1 to 2 sprays per nostril up to twice daily. - Continue with Astelin 1 to 2 sprays per nostril up to twice daily.   3.Lesion on top of sternum - If the CXR was consistent with arthirits, that is probably the case. - I would get more concerned if it grew at all or became painful.  4. Return in about 6 months (around 02/14/2021).    Please inform us of any Emergency Department visits, hospitalizations, or changes in symptoms. Call us before going to the ED for breathing or allergy symptoms since we might be able to fit you in for a sick visit. Feel free to contact us anytime with any questions, problems, or concerns.  It was a pleasure to see you again today!  Websites that have reliable patient information: 1. American Academy of Asthma, Allergy, and Immunology: www.aaaai.org 2. Food Allergy Research and Education (FARE): foodallergy.org 3. Mothers of Asthmatics: http://www.asthmacommunitynetwork.org 4. American College of Allergy, Asthma, and Immunology:  www.acaai.org   COVID-19 Vaccine Information can be found at: ShippingScam.co.uk For questions related to vaccine distribution or appointments, please email [email or web address] or call [phone].   We realize that you might be concerned about having an allergic reaction to the COVID19 vaccines. To help with that concern, WE ARE OFFERING THE COVID19 VACCINES IN OUR OFFICE! Ask the front desk for dates!     "Like" [place] on Facebook and Instagram for our latest updates!      A healthy democracy works best when [employer] participate! Make sure you are registered to vote! If you have moved or changed any of your contact information, you will need to get this updated before voting!  In some cases, you MAY be able to register to vote online: [email or web address]

## 2020-08-14 NOTE — Progress Notes (Signed)
FOLLOW UP  Date of Service/Encounter:  08/14/20   Assessment:   Moderate persistent asthma - withimproved spirometry today  Perennial allergic rhinitis(dust mites, cockroach,ragweed) -on allergen immunotherapywith maintenance reached August 2020  Complex medical history  Fully vaccinated for [place] (April 2021)  Plan/Recommendations:   1. Moderate persistent asthma, uncomplicated - Lung function looks excellent today. - We are not going to make any medication changes at this time.  - Try walking with a mask OR walking inside.  - Daily controller medication(s): Symbicort 160/4.50mcg two puffs twice daily with spacer - Prior to physical activity: albuterol 2 puffs 10-15 minutes before physical activity. - Rescue medications: albuterol 4 puffs every 4-6 hours as needed - Asthma control goals:  * Full participation in all desired activities (may need albuterol before activity) * Albuterol use two time or less a week on average (not counting use with activity) * Cough interfering with sleep two time or less a month * Oral steroids no more than once a year * No hospitalizations  2. Perennial allergic rhinitis (dust mites, cockroach) - Continue with allergy shots at the same schedule (every two weeks).  - Continue with cetirizine 10 mg daily. - Continue with Flonase 1 to 2 sprays per nostril up to twice daily. - Continue with Astelin 1 to 2 sprays per nostril up to twice daily.   3.Lesion on top of sternum - If the CXR was consistent with arthirits, that is probably the case. - I would get more concerned if it grew at all or became painful.  4. Return in about 6 months (around 02/14/2021).   Subjective:   Cheryl [name] is a 65 y.o. female presenting today for follow up of  Chief Complaint  Patient presents with  . Asthma    ACT - 24 no issue with asthma   . Allergic Rhinitis     Runny nose itchy eyes, and sneezing     Cheryl [name] has a history of the  following: Patient Active Problem List   Diagnosis Date Noted  . Moderate persistent asthma, uncomplicated 38/25/0539  . OSA (obstructive sleep apnea) 12/02/2016  . Hyperlipidemia LDL goal <100 10/21/2014  . Essential hypertension, benign 10/21/2014  . DM w/o complication type II ([employer]) 09/13/2014  . Routine general medical examination at a health care facility 10/12/2013  . 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 05/03/2013  . Need for prophylactic vaccination and inoculation against influenza 02/08/2013  . GERD (gastroesophageal reflux disease) 02/08/2013  . Other and unspecified hyperlipidemia 11/10/2012  . Type II or unspecified type diabetes mellitus without mention of complication, not stated as uncontrolled 10/25/2012  . Impaired fasting glucose 09/07/2012  . Morbid obesity ([place]) 09/07/2012  . Generalized osteoarthrosis, involving multiple sites 09/07/2012  . Carpal tunnel syndrome 09/07/2012  . Other, multiple, and unspecified sites, insect bite, nonvenomous, without mention of infection(919.4) 09/07/2012  . Special screening for malignant neoplasms, colon 09/07/2012  . Other specified cardiac dysrhythmias(427.89) 09/07/2012  . Candidiasis of vulva and vagina 09/07/2012  . Abdominal pain, generalized 09/07/2012  . Symptomatic menopausal or female climacteric states 09/07/2012  . Essential hypertension 09/07/2012  . Perennial allergic rhinitis 09/07/2012  . Pain in joint, site unspecified 09/07/2012  . Other malaise and fatigue 09/07/2012    History obtained from: chart review and patient.  Cheryl [name] is a 65 y.o. female presenting for a follow up visit.  She was last seen in September 2021.  At that time, her lung function looked excellent.  We continue with Symbicort 160 mcg 2 puffs twice daily as well as albuterol 2 puffs 10 to 15 minutes before physical activity.  For her allergic rhinitis, we continue with her allergy shots.  We increased the ragweed and dust mite in her  prescriptions.  We continue with Flonase as well as Astelin up to twice daily and cetirizine 10 mg daily.  Since the last visit, she has done very well  She is very bubbly today and interactive, smiling up a storm.  Asthma/Respiratory Symptom History: She reamiend on the Symbicort two puffs twice daily. She has bene doing well with this regimen.  Cheryl [name]'s asthma has been well controlled. She has not required rescue medication, experienced nocturnal awakenings due to lower respiratory symptoms, nor have activities of daily living been limited. She has required no Emergency Department or Urgent Care visits for her asthma. She has required zero courses of systemic steroids for asthma exacerbations since the last visit. ACT score today is 24, indicating excellent asthma symptom control.   Allergic Rhinitis Symptom History: She remains on her allergy shots at the same schedule. She is on the cetirizine 10mg  daily as well as the Flonase and the Asteliin. She does change up to the dosing of the nasal sprays depending on her symptoms.   She does report some a nodule on the top of her sternum near her clavicle. Evidently she has had a CXR and it showed likely osteoarthritis. They are not going to do anything about this at this point in time.   Otherwise, there have been no changes to her past medical history, surgical history, family history, or social history.    Review of Systems  Constitutional: Negative.  Negative for chills, fever, malaise/fatigue and weight loss.  HENT: Negative.  Negative for congestion, ear discharge, ear pain and sore throat.   Eyes: Negative for pain, discharge and redness.  Respiratory: Negative for cough, sputum production, shortness of breath and wheezing.   Cardiovascular: Negative.  Negative for chest pain and palpitations.  Gastrointestinal: Negative for abdominal pain, constipation, diarrhea, heartburn, nausea and vomiting.  Skin: Negative.  Negative for itching and rash.   Neurological: Negative for dizziness and headaches.  Endo/Heme/Allergies: Negative for environmental allergies. Does not bruise/bleed easily.       Objective:   Blood pressure 132/80, pulse 64, temperature 98.3 F (36.8 C), resp. rate 18, height 5\' 8"  (1.727 m), weight 285 lb 12.8 oz (129.6 kg), SpO2 99 %. Body mass index is 43.46 kg/m.   Physical Exam:  Physical Exam Constitutional:      Appearance: She is well-developed.  HENT:     Head: Normocephalic and atraumatic.     Right Ear: Tympanic membrane, ear canal and external ear normal.     Left Ear: Tympanic membrane, ear canal and external ear normal.     Nose: No nasal deformity, septal deviation, mucosal edema or rhinorrhea.     Right Turbinates: Enlarged and swollen.     Left Turbinates: Enlarged and swollen.     Right Sinus: No maxillary sinus tenderness or frontal sinus tenderness.     Left Sinus: No maxillary sinus tenderness or frontal sinus tenderness.     Mouth/Throat:     Mouth: Mucous membranes are not pale and not dry.     Pharynx: Uvula midline.  Eyes:     General:        Right eye: No discharge.        Left eye: No discharge.  Conjunctiva/sclera: Conjunctivae normal.     Right eye: Right conjunctiva is not injected. No chemosis.    Left eye: Left conjunctiva is not injected. No chemosis.    Pupils: Pupils are equal, round, and reactive to light.  Cardiovascular:     Rate and Rhythm: Normal rate and regular rhythm.     Heart sounds: Normal heart sounds.  Pulmonary:     Effort: Pulmonary effort is normal. No tachypnea, accessory muscle usage or respiratory distress.     Breath sounds: Normal breath sounds. No wheezing, rhonchi or rales.  Chest:     Chest wall: No tenderness.     Comments: She has what feels like a bone spur at the top of her sternum. It is non mobile and non painful.  Lymphadenopathy:     Cervical: No cervical adenopathy.  Skin:    General: Skin is warm.     Capillary Refill:  Capillary refill takes less than 2 seconds.     Coloration: Skin is not pale.     Findings: No abrasion, erythema, petechiae or rash. Rash is not papular, urticarial or vesicular.     Comments: No eczematous or urticarial lesions noted.  Neurological:     Mental Status: She is alert.  Psychiatric:        Behavior: Behavior is cooperative.      Diagnostic studies:    Spirometry: results normal (FEV1: 1.83/79%, FVC: 2.53/85%, FEV1/FVC: 72%).    Spirometry consistent with normal pattern.   Allergy Studies: none        Salvatore Marvel, MD  [facility]

## 2020-08-15 ENCOUNTER — Encounter: Payer: Self-pay | Admitting: Allergy & Immunology

## 2020-08-21 ENCOUNTER — Ambulatory Visit (INDEPENDENT_AMBULATORY_CARE_PROVIDER_SITE_OTHER): Payer: Medicare Other | Admitting: *Deleted

## 2020-08-21 DIAGNOSIS — J309 Allergic rhinitis, unspecified: Secondary | ICD-10-CM

## 2020-08-27 ENCOUNTER — Other Ambulatory Visit: Payer: Self-pay | Admitting: Allergy & Immunology

## 2020-08-27 NOTE — Telephone Encounter (Signed)
Sent in refill for azelastine nasal spray pt. Has an appointment 02/19/21

## 2020-09-11 ENCOUNTER — Ambulatory Visit (INDEPENDENT_AMBULATORY_CARE_PROVIDER_SITE_OTHER): Payer: Medicare Other | Admitting: *Deleted

## 2020-09-11 DIAGNOSIS — J309 Allergic rhinitis, unspecified: Secondary | ICD-10-CM | POA: Diagnosis not present

## 2020-09-13 DIAGNOSIS — Z23 Encounter for immunization: Secondary | ICD-10-CM | POA: Diagnosis not present

## 2020-09-26 DIAGNOSIS — E1151 Type 2 diabetes mellitus with diabetic peripheral angiopathy without gangrene: Secondary | ICD-10-CM | POA: Diagnosis not present

## 2020-09-26 DIAGNOSIS — I739 Peripheral vascular disease, unspecified: Secondary | ICD-10-CM | POA: Diagnosis not present

## 2020-09-26 DIAGNOSIS — L603 Nail dystrophy: Secondary | ICD-10-CM | POA: Diagnosis not present

## 2020-10-01 ENCOUNTER — Other Ambulatory Visit: Payer: Self-pay | Admitting: Allergy & Immunology

## 2020-10-02 ENCOUNTER — Other Ambulatory Visit: Payer: Self-pay | Admitting: Allergy & Immunology

## 2020-10-02 ENCOUNTER — Ambulatory Visit (INDEPENDENT_AMBULATORY_CARE_PROVIDER_SITE_OTHER): Payer: Medicare Other | Admitting: *Deleted

## 2020-10-02 DIAGNOSIS — J309 Allergic rhinitis, unspecified: Secondary | ICD-10-CM

## 2020-10-05 NOTE — Progress Notes (Signed)
Cheryl [name]    [id number]    1955/11/11  [facility], Brayton Layman, DO  HPI female never smoker followed for OSA, complicated by asthma( Dr Vaughan Browner), DM 2, HBP, GERD, allergic rhinitis/allergy vaccine (Dr Donneta Romberg), glaucoma NPSG- 11/25/16- AHI 40.3/hour, desaturation to 88%, body weight 286 pounds  ---------------------------------------------------------------- 10/05/19-- 65 year old female never smoker followed for OSA, complicated by asthma( Dr Vaughan Browner), DM 2, HBP, GERD, allergic rhinitis/allergy vaccine , glaucoma CPAP auto 5-15/ [employer] Download c6 lbsompliance 90%, AHI 3.2/ hr Body weight today 28 2 Phizer Covax Sleeping well with no cocerns.   10/08/20-  65 year old female never smoker followed for OSA, complicated by asthma( Dr Vaughan Browner), DM 2, HBP, GERD, allergic rhinitis/allergy vaccine , glaucoma CPAP auto 5-15/ [employer]            AirSense 10 AutoSet Download-compliance 100%, AHI 3.6/ hr    High leak  Body weight today-287 lbs Covid vax- 4 Phizer Doing well. Just got new supplies. Was coughing- better now. Will f/u w/ Dr Vaughan Browner. CXR 03/29/20- IMPRESSION: 1. No active cardiopulmonary disease. 2. No aggressive bony change in the region of the proximal clavicle. An ultrasound could better evaluate the reported palpable lump if clinically indicated.  ROS-see HPI   + = positive Constitutional:    weight loss, night sweats, fevers, chills, fatigue, lassitude. HEENT:    headaches, difficulty swallowing, tooth/dental problems, sore throat,       sneezing, itching, ear ache, nasal congestion, post nasal drip, snoring CV:    chest pain, orthopnea, PND, swelling in lower extremities, anasarca,                                            dizziness, palpitations Resp:   shortness of breath with exertion or at rest.                productive cough,   non-productive cough, coughing up of blood.              change in color of mucus.  wheezing.   Skin:    rash or  lesions. GI:  No-   heartburn, indigestion, abdominal pain, nausea, vomiting, diarrhea,                 change in bowel habits, loss of appetite GU: dysuria, change in color of urine, no urgency or frequency.   flank pain. MS:   joint pain, stiffness, decreased range of motion, back pain. Neuro-     nothing unusual Psych:  change in mood or affect.  depression or anxiety.   memory loss.  OBJ- Physical Exam General- Alert, Oriented, Affect-appropriate, Distress- none acute, + morbidly obese Skin- rash-none, lesions- none, excoriation- none Lymphadenopathy- none Head- atraumatic            Eyes- Gross vision intact, PERRLA, conjunctivae and secretions clear            Ears- Hearing, canals-normal            Nose- Clear, no-Septal dev, mucus, polyps, erosion, perforation             Throat- Mallampati IV , mucosa clear , drainage- none, tonsils- atrophic, + own teeth Neck- flexible , trachea midline, no stridor , thyroid nl, carotid no bruit Chest - symmetrical excursion , unlabored  Heart/CV- RRR , no murmur , no gallop  , no rub, nl s1 s2                           - JVD- none , edema- none, stasis changes- none, varices- none           Lung- clear to P&A, wheeze- none, cough- none , dullness-none, rub- none           Chest wall-  Abd-  Br/ Gen/ Rectal- Not done, not indicated Extrem- cyanosis- none, clubbing, none, atrophy- none, strength- nl Neuro- grossly intact to observation

## 2020-10-08 ENCOUNTER — Other Ambulatory Visit: Payer: Self-pay

## 2020-10-08 ENCOUNTER — Ambulatory Visit (INDEPENDENT_AMBULATORY_CARE_PROVIDER_SITE_OTHER): Payer: Medicare Other | Admitting: Internal Medicine

## 2020-10-08 ENCOUNTER — Encounter: Payer: Self-pay | Admitting: Internal Medicine

## 2020-10-08 VITALS — BP 124/82 | HR 64 | Temp 97.7°F | Ht 68.0 in | Wt 287.6 lb

## 2020-10-08 DIAGNOSIS — J4540 Moderate persistent asthma, uncomplicated: Secondary | ICD-10-CM

## 2020-10-08 DIAGNOSIS — G4733 Obstructive sleep apnea (adult) (pediatric): Secondary | ICD-10-CM

## 2020-10-08 NOTE — Patient Instructions (Addendum)
Order- referral for mask fitting at sleep center  Dx OSA  We can continue auto 6-28  I certainly do recommend you get back to regular walking.  Please call if we can help

## 2020-10-17 ENCOUNTER — Telehealth: Payer: Self-pay | Admitting: Internal Medicine

## 2020-10-17 NOTE — Telephone Encounter (Signed)
Called pt & gave her the phone # to sleep lab so she can call & reschedule mask fit to suit her schedule.  Nothing further needed.

## 2020-10-23 ENCOUNTER — Ambulatory Visit (INDEPENDENT_AMBULATORY_CARE_PROVIDER_SITE_OTHER): Payer: Medicare Other | Admitting: *Deleted

## 2020-10-23 DIAGNOSIS — J309 Allergic rhinitis, unspecified: Secondary | ICD-10-CM

## 2020-11-02 DIAGNOSIS — S92505A Nondisplaced unspecified fracture of left lesser toe(s), initial encounter for closed fracture: Secondary | ICD-10-CM | POA: Diagnosis not present

## 2020-11-05 ENCOUNTER — Telehealth: Payer: Self-pay | Admitting: *Deleted

## 2020-11-05 ENCOUNTER — Other Ambulatory Visit: Payer: Self-pay | Admitting: Diagnostic Radiology

## 2020-11-05 DIAGNOSIS — I872 Venous insufficiency (chronic) (peripheral): Secondary | ICD-10-CM

## 2020-11-06 ENCOUNTER — Telehealth: Payer: Self-pay | Admitting: *Deleted

## 2020-11-06 NOTE — Telephone Encounter (Signed)
Called Cheryl [name] to inquire if she continues to be symptomatic and if she wishes to pursue additional treatment for her leg edema/veins. She states no she has been walking and that helps. She does not wish to pursue treatment at this time. I told her to call if anything changes.Cathren Harsh

## 2020-11-13 ENCOUNTER — Ambulatory Visit (INDEPENDENT_AMBULATORY_CARE_PROVIDER_SITE_OTHER): Payer: Medicare Other | Admitting: *Deleted

## 2020-11-13 DIAGNOSIS — J309 Allergic rhinitis, unspecified: Secondary | ICD-10-CM | POA: Diagnosis not present

## 2020-11-14 DIAGNOSIS — E1169 Type 2 diabetes mellitus with other specified complication: Secondary | ICD-10-CM | POA: Diagnosis not present

## 2020-11-14 DIAGNOSIS — M130 Polyarthritis, unspecified: Secondary | ICD-10-CM | POA: Diagnosis not present

## 2020-11-14 DIAGNOSIS — Z6841 Body Mass Index (BMI) 40.0 and over, adult: Secondary | ICD-10-CM | POA: Diagnosis not present

## 2020-11-14 DIAGNOSIS — G4730 Sleep apnea, unspecified: Secondary | ICD-10-CM | POA: Diagnosis not present

## 2020-11-14 DIAGNOSIS — I10 Essential (primary) hypertension: Secondary | ICD-10-CM | POA: Diagnosis not present

## 2020-11-14 DIAGNOSIS — E669 Obesity, unspecified: Secondary | ICD-10-CM | POA: Diagnosis not present

## 2020-11-14 DIAGNOSIS — J301 Allergic rhinitis due to pollen: Secondary | ICD-10-CM | POA: Diagnosis not present

## 2020-11-20 ENCOUNTER — Other Ambulatory Visit (HOSPITAL_BASED_OUTPATIENT_CLINIC_OR_DEPARTMENT_OTHER): Payer: Medicare Other | Admitting: Internal Medicine

## 2020-11-25 ENCOUNTER — Other Ambulatory Visit: Payer: Self-pay | Admitting: Allergy & Immunology

## 2020-11-27 ENCOUNTER — Ambulatory Visit (HOSPITAL_BASED_OUTPATIENT_CLINIC_OR_DEPARTMENT_OTHER): Payer: Medicare Other | Attending: Internal Medicine | Admitting: Internal Medicine

## 2020-11-27 ENCOUNTER — Other Ambulatory Visit: Payer: Self-pay

## 2020-11-27 DIAGNOSIS — G4733 Obstructive sleep apnea (adult) (pediatric): Secondary | ICD-10-CM

## 2020-12-04 ENCOUNTER — Ambulatory Visit (INDEPENDENT_AMBULATORY_CARE_PROVIDER_SITE_OTHER): Payer: Medicare Other | Admitting: *Deleted

## 2020-12-04 DIAGNOSIS — J309 Allergic rhinitis, unspecified: Secondary | ICD-10-CM

## 2020-12-05 DIAGNOSIS — M130 Polyarthritis, unspecified: Secondary | ICD-10-CM | POA: Diagnosis not present

## 2020-12-05 DIAGNOSIS — I10 Essential (primary) hypertension: Secondary | ICD-10-CM | POA: Diagnosis not present

## 2020-12-05 DIAGNOSIS — E669 Obesity, unspecified: Secondary | ICD-10-CM | POA: Diagnosis not present

## 2020-12-05 DIAGNOSIS — J3089 Other allergic rhinitis: Secondary | ICD-10-CM | POA: Diagnosis not present

## 2020-12-05 DIAGNOSIS — J301 Allergic rhinitis due to pollen: Secondary | ICD-10-CM | POA: Diagnosis not present

## 2020-12-05 DIAGNOSIS — G4730 Sleep apnea, unspecified: Secondary | ICD-10-CM | POA: Diagnosis not present

## 2020-12-05 DIAGNOSIS — E1169 Type 2 diabetes mellitus with other specified complication: Secondary | ICD-10-CM | POA: Diagnosis not present

## 2020-12-05 NOTE — Progress Notes (Signed)
VIAL MADE. EXP 12-05-21 

## 2020-12-25 ENCOUNTER — Ambulatory Visit (INDEPENDENT_AMBULATORY_CARE_PROVIDER_SITE_OTHER): Payer: Medicare Other

## 2020-12-25 DIAGNOSIS — J309 Allergic rhinitis, unspecified: Secondary | ICD-10-CM | POA: Diagnosis not present

## 2020-12-26 DIAGNOSIS — L603 Nail dystrophy: Secondary | ICD-10-CM | POA: Diagnosis not present

## 2020-12-26 DIAGNOSIS — E1151 Type 2 diabetes mellitus with diabetic peripheral angiopathy without gangrene: Secondary | ICD-10-CM | POA: Diagnosis not present

## 2020-12-26 DIAGNOSIS — I739 Peripheral vascular disease, unspecified: Secondary | ICD-10-CM | POA: Diagnosis not present

## 2021-01-15 ENCOUNTER — Ambulatory Visit (INDEPENDENT_AMBULATORY_CARE_PROVIDER_SITE_OTHER): Payer: Medicare Other | Admitting: *Deleted

## 2021-01-15 DIAGNOSIS — J309 Allergic rhinitis, unspecified: Secondary | ICD-10-CM | POA: Diagnosis not present

## 2021-01-22 ENCOUNTER — Ambulatory Visit (INDEPENDENT_AMBULATORY_CARE_PROVIDER_SITE_OTHER): Payer: Medicare Other | Admitting: *Deleted

## 2021-01-22 DIAGNOSIS — J309 Allergic rhinitis, unspecified: Secondary | ICD-10-CM | POA: Diagnosis not present

## 2021-01-25 ENCOUNTER — Encounter: Payer: Self-pay | Admitting: Internal Medicine

## 2021-01-25 ENCOUNTER — Other Ambulatory Visit: Payer: Self-pay

## 2021-01-25 ENCOUNTER — Ambulatory Visit: Payer: Medicare Other | Attending: Internal Medicine | Admitting: Internal Medicine

## 2021-01-25 VITALS — BP 116/74 | HR 65 | Resp 16 | Wt 292.0 lb

## 2021-01-25 DIAGNOSIS — Z833 Family history of diabetes mellitus: Secondary | ICD-10-CM | POA: Diagnosis not present

## 2021-01-25 DIAGNOSIS — Z79899 Other long term (current) drug therapy: Secondary | ICD-10-CM | POA: Diagnosis not present

## 2021-01-25 DIAGNOSIS — Z7982 Long term (current) use of aspirin: Secondary | ICD-10-CM | POA: Diagnosis not present

## 2021-01-25 DIAGNOSIS — E1169 Type 2 diabetes mellitus with other specified complication: Secondary | ICD-10-CM | POA: Diagnosis not present

## 2021-01-25 DIAGNOSIS — Z7951 Long term (current) use of inhaled steroids: Secondary | ICD-10-CM | POA: Insufficient documentation

## 2021-01-25 DIAGNOSIS — I10 Essential (primary) hypertension: Secondary | ICD-10-CM | POA: Diagnosis not present

## 2021-01-25 DIAGNOSIS — J4540 Moderate persistent asthma, uncomplicated: Secondary | ICD-10-CM | POA: Insufficient documentation

## 2021-01-25 DIAGNOSIS — E785 Hyperlipidemia, unspecified: Secondary | ICD-10-CM | POA: Insufficient documentation

## 2021-01-25 DIAGNOSIS — I152 Hypertension secondary to endocrine disorders: Secondary | ICD-10-CM

## 2021-01-25 DIAGNOSIS — Z6841 Body Mass Index (BMI) 40.0 and over, adult: Secondary | ICD-10-CM | POA: Diagnosis not present

## 2021-01-25 DIAGNOSIS — Z791 Long term (current) use of non-steroidal anti-inflammatories (NSAID): Secondary | ICD-10-CM | POA: Insufficient documentation

## 2021-01-25 DIAGNOSIS — Z885 Allergy status to narcotic agent status: Secondary | ICD-10-CM | POA: Diagnosis not present

## 2021-01-25 DIAGNOSIS — G4733 Obstructive sleep apnea (adult) (pediatric): Secondary | ICD-10-CM | POA: Diagnosis not present

## 2021-01-25 DIAGNOSIS — Z7689 Persons encountering health services in other specified circumstances: Secondary | ICD-10-CM

## 2021-01-25 DIAGNOSIS — Z23 Encounter for immunization: Secondary | ICD-10-CM

## 2021-01-25 DIAGNOSIS — E1159 Type 2 diabetes mellitus with other circulatory complications: Secondary | ICD-10-CM | POA: Diagnosis not present

## 2021-01-25 LAB — POCT GLYCOSYLATED HEMOGLOBIN (HGB A1C): HbA1c, POC (controlled diabetic range): 5.9 % (ref 0.0–7.0)

## 2021-01-25 LAB — GLUCOSE, POCT (MANUAL RESULT ENTRY): POC Glucose: 114 mg/dl — AB (ref 70–99)

## 2021-01-25 NOTE — Patient Instructions (Signed)

## 2021-01-25 NOTE — Progress Notes (Signed)
Patient ID: Cheryl [name], female    DOB: [birth year]  MRN: [id number]  CC: New Patient (Initial Visit) (Patient states pain in leg accompanied with some swelling.)   Subjective: Cheryl [name] is a 65 y.o. female who presents for new pt visit Her concerns today include:  Pt with hx of PreDM type 2, HTN, HL, moderate persistent asthma, OSA, morbid obesity, arthritis/LBP  Previous PCP was Dr. Guido Sander at [facility]  Prior to that she was at [facility].  She was not pleased with the care she was receiving at [facility].  She is not sure whether she would stick with Dr. Criss Rosales or not but wanted to try someone different.  DM/Obesity:  dx several yrs ago.  Diet control but was on Metformin in the past. Last eye exam was about 1 yr ago at [facility].  Gives hx of cataracts  -not walking as much as she did in past.  Walked 2 days this wk. Goal is to get in 3 days /wk -saw nutritionist some yrs ago.  "Eating habits poor."  Eating a lot of sweet  HYPERTENSION Currently taking: see medication list.  She is on triamterene/HCTZ and lisinopril. Med Adherence: '[x]'$  Yes    '[]'$  No Medication side effects: '[]'$  Yes    '[x]'$  No Adherence with salt restriction: '[x]'$  Yes    '[]'$  No Home Monitoring?: '[]'$  Yes    '[]'$  No Monitoring Frequency:  Home BP results range:  SOB? '[]'$  Yes    '[x]'$  No Chest Pain?: '[]'$  Yes    '[x]'$  No Leg swelling?: '[x]'$  Yes -recently.  Started after a road trip in July to [place].  Better now; wears compression stocking Headaches?: '[]'$  Yes    '[x]'$  No Dizziness? '[]'$  Yes    '[x]'$  No Comments:   HL: taking and tolerating Zocor  Asthma: History of moderate persistent asthma.  She is on Symbicort inhaler and albuterol.  Stable on Symbicort.  No recent asthma attacks  OSA on CPAP: using the machine consistently.  Wakes up refresh  HM: due for MMG (she gets this at [facility]), eye exam, flu shot.  Had pneumonia vaccine at [facility].  Had c-scope last yr with Dr. Michail Sermon.   Past  medical, surgical, family history and social history reviewed. Patient Active Problem List   Diagnosis Date Noted   Moderate persistent asthma, uncomplicated [id number]   OSA (obstructive sleep apnea) 12/02/2016   Hyperlipidemia LDL goal <100 10/21/2014   Essential hypertension, benign 123456   DM w/o complication type II ([place]) 09/13/2014   Routine general medical examination at a health care facility 10/12/2013   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 05/03/2013   Need for prophylactic vaccination and inoculation against influenza 02/08/2013   GERD (gastroesophageal reflux disease) 02/08/2013   Other and unspecified hyperlipidemia 11/10/2012   Type II or unspecified type diabetes mellitus without mention of complication, not stated as uncontrolled 10/25/2012   Impaired fasting glucose 09/07/2012   Morbid obesity ([place]) 09/07/2012   Generalized osteoarthrosis, involving multiple sites 09/07/2012   Carpal tunnel syndrome 09/07/2012   Other, multiple, and unspecified sites, insect bite, nonvenomous, without mention of infection(919.4) 09/07/2012   Special screening for malignant neoplasms, colon 09/07/2012   Other specified cardiac dysrhythmias(427.89) 09/07/2012   Candidiasis of vulva and vagina 09/07/2012   Abdominal pain, generalized 09/07/2012   Symptomatic menopausal or female climacteric states 09/07/2012   Essential hypertension 09/07/2012   Perennial allergic rhinitis 09/07/2012  Pain in joint, site unspecified 09/07/2012   Other malaise and fatigue 09/07/2012     Current Outpatient Medications on File Prior to Visit  Medication Sig Dispense Refill   ACCU-CHEK AVIVA PLUS test strip CHECK BLOOD SUGAR ONCE DAILY AS INSTRUCTED DX E11.9 100 each 10   acetaminophen (TYLENOL) 500 MG tablet Take 500 mg by mouth daily.     albuterol (VENTOLIN HFA) 108 (90 Base) MCG/ACT inhaler Inhale 2 puffs into the lungs every 6 (six) hours as needed for wheezing or shortness of breath.  18 g 1   AMBULATORY NON FORMULARY MEDICATION Medication Name: Allergy Injection- every 3 weeks     aspirin 81 MG tablet Take 81 mg by mouth daily.     azelastine (ASTELIN) 0.1 % nasal spray 1-2 SPRAYS IN EACH NOSTRIL TWICE A DAY 30 mL 5   B-D ULTRA-FINE 33 LANCETS MISC Use to test blood sugar once daily. Dx: E11.9 100 each 6   Blood Glucose Monitoring Suppl (PRODIGY AUTOCODE BLOOD GLUCOSE) DEVI by Does not apply route. Daily as directed     budesonide-formoterol (SYMBICORT) 160-4.5 MCG/ACT inhaler 2 puffs twice daily 30.6 each 1   Calcium Carbonate-Vitamin D 600-400 MG-UNIT chew tablet Chew 1 tablet by mouth daily.      EPINEPHrine (EPIPEN 2-PAK) 0.3 mg/0.3 mL IJ SOAJ injection Inject 0.3 mg into the muscle as needed for anaphylaxis. 2 each 1   fluticasone (FLONASE) 50 MCG/ACT nasal spray PLACE 1-2 SPRAYS INTO BOTH NOSTRILS DAILY. 16 mL 0   levocetirizine (XYZAL) 5 MG tablet TAKE 1 TABLET BY MOUTH EVERY DAY IN THE EVENING 90 tablet 0   lisinopril (ZESTRIL) 10 MG tablet TAKE 1 TABLET BY MOUTH EVERY DAY 30 tablet 1   nabumetone (RELAFEN) 500 MG tablet Take 500 mg by mouth 2 (two) times daily as needed.     omeprazole (PRILOSEC) 40 MG capsule TAKE 1 CAPSULE BY MOUTH EVERY DAY 90 capsule 0   Pediatric Multivitamins-Fl (MULTIPLE VITAMINS/FLUORIDE) 1 MG CHEW      simvastatin (ZOCOR) 20 MG tablet Take one tablet by mouth once daily at bedtime for cholesterol 90 tablet 2   triamterene-hydrochlorothiazide (MAXZIDE) 75-50 MG tablet TAKE ONE TABLET BY MOUTH ONCE DAILY FOR BLOOD PRESSURE 90 tablet 1   vitamin C (ASCORBIC ACID) 500 MG tablet Take 500 mg by mouth every other day.     vitamin E 180 MG (400 UNITS) capsule Take 400 Units by mouth every other day.      No current facility-administered medications on file prior to visit.    Allergies  Allergen Reactions   Elemental Sulfur Diarrhea   Oxycodone     Had stomach and headache as side effect from medicine    Social History   Socioeconomic  History   Marital status: Divorced    Spouse name: Not on file   Number of children: Not on file   Years of education: Not on file   Highest education level: Not on file  Occupational History   Not on file  Tobacco Use   Smoking status: Never   Smokeless tobacco: Never  Vaping Use   Vaping Use: Never used  Substance and Sexual Activity   Alcohol use: Yes    Comment: Seldom- Wine    Drug use: No   Sexual activity: Never    Comment: post office worker  Other Topics Concern   Not on file  Social History Narrative   Not on file   Social Determinants of Health  Financial Resource Strain: Not on file  Food Insecurity: Not on file  Transportation Needs: Not on file  Physical Activity: Not on file  Stress: Not on file  Social Connections: Not on file  Intimate Partner Violence: Not on file    Family History  Problem Relation Age of Onset   Cancer Father    Diabetes Father    Allergic rhinitis Son    Diabetes Brother    Allergic rhinitis Son    Diabetes Paternal Aunt    Lung cancer Cousin    Asthma Neg Hx     Past Surgical History:  Procedure Laterality Date   ABDOMINAL HYSTERECTOMY     1994   carpal tunnel both hands     2012   CLOSED MANIPULATION SHOULDER Left 04/2014   EYE SURGERY Left 08/25/2015   EYE SURGERY Right 09/24/2015   JOINT REPLACEMENT     both knees replacement, 2010   mole removed from face     Nodule removed from back     SHOULDER SURGERY Left 11/2013   TONSILLECTOMY     as teenager    ROS: Review of Systems Negative except as stated above  PHYSICAL EXAM: BP 116/74   Pulse 65   Resp 16   Wt 292 lb (132.5 kg)   SpO2 99%   BMI 44.40 kg/m   Wt Readings from Last 3 Encounters:  01/25/21 292 lb (132.5 kg)  10/08/20 287 lb 9.6 oz (130.5 kg)  08/14/20 285 lb 12.8 oz (129.6 kg)    Physical Exam  General appearance - alert, well appearing, older African-American female and in no distress Mental status - normal mood, behavior,  speech, dress, motor activity, and thought processes Nose - normal and patent, no erythema, discharge or polyps Mouth - mucous membranes moist, pharynx normal without lesions Neck - supple, no significant adenopathy Chest - clear to auscultation, no wheezes, rales or rhonchi, symmetric air entry Heart - normal rate, regular rhythm, normal S1, S2, no murmurs, rubs, clicks or gallops Extremities - peripheral pulses normal, no pedal edema, no clubbing or cyanosis Diabetic Foot Exam - Simple   Simple Foot Form Visual Inspection See comments: Yes Sensation Testing Intact to touch and monofilament testing bilaterally: Yes Pulse Check Posterior Tibialis and Dorsalis pulse intact bilaterally: Yes Comments Patient is flat-footed.      CMP Latest Ref Rng & Units 08/17/2018 07/23/2018 03/19/2018  Glucose 65 - 99 mg/dL 101(H) 99 102(H)  BUN 7 - 25 mg/dL '9 12 11  '$ Creatinine 0.50 - 0.99 mg/dL 0.83 0.70 0.81  Sodium 135 - 146 mmol/L 142 142 142  Potassium 3.5 - 5.3 mmol/L 3.8 3.6 3.7  Chloride 98 - 110 mmol/L 103 103 103  CO2 20 - 32 mmol/L '31 30 29  '$ Calcium 8.6 - 10.4 mg/dL 9.9 9.9 9.6  Total Protein 6.1 - 8.1 g/dL - 7.1 6.7  Total Bilirubin 0.2 - 1.2 mg/dL - 0.5 0.5  Alkaline Phos 33 - 130 U/L - - -  AST 10 - 35 U/L - 17 14  ALT 6 - 29 U/L - 14 13   Lipid Panel     Component Value Date/Time   CHOL 155 07/23/2018 1045   CHOL 158 10/24/2015 1009   TRIG 83 07/23/2018 1045   HDL 52 07/23/2018 1045   HDL 66 10/24/2015 1009   CHOLHDL 3.0 07/23/2018 1045   VLDL 20 11/17/2016 1007   LDLCALC 86 07/23/2018 1045    CBC    Component Value  Date/Time   WBC 8.7 07/29/2018 1443   WBC 9.4 11/13/2017 1025   RBC 4.89 07/29/2018 1443   RBC 4.80 11/13/2017 1025   HGB 13.6 07/29/2018 1443   HCT 39.9 07/29/2018 1443   PLT 398 07/29/2018 1443   MCV 82 07/29/2018 1443   MCH 27.8 07/29/2018 1443   MCH 26.9 (L) 11/13/2017 1025   MCHC 34.1 07/29/2018 1443   MCHC 33.3 11/13/2017 1025   RDW 14.7  07/29/2018 1443   LYMPHSABS 2.3 07/29/2018 1443   MONOABS 576 11/17/2016 1007   EOSABS 0.2 07/29/2018 1443   BASOSABS 0.0 07/29/2018 1443    ASSESSMENT AND PLAN: 1. Encounter to establish care   2. Type 2 diabetes mellitus with morbid obesity ([place]) Commended her on good control of diabetes without medication. Dietary counseling given.  Advised to cut back on portion sizes of white carbohydrates, eliminate sugary drinks from the diet, eat more white meat instead of red meat and incorporate fresh fruits and vegetables into the diet.  Encouraged her to move as much as her back will allow. - CBC - Comprehensive metabolic panel - Lipid panel - Microalbumin / creatinine urine ratio - Ambulatory referral to Ophthalmology - Amb ref to Medical Nutrition Therapy-MNT - POCT glucose (manual entry) - POCT glycosylated hemoglobin (Hb A1C)  3. Hypertension associated with diabetes ([place]) Controlled on current medications.  We will check some baseline blood test today.  4. Hyperlipidemia associated with type 2 diabetes mellitus ([place]) Continue Zocor.  Check lipid profile today.  5. Need for immunization against influenza - Flu Vaccine QUAD 51moIM (Fluarix, Fluzone & Alfiuria Quad PF) I will have the [facility]ist [facility] to get dates of her pneumonia vaccine so that we can update her health maintenance.  6. Moderate persistent asthma, uncomplicated Stable on Symbicort.  Patient to sign a release for [facility] get a copy of her colonoscopy report from Dr. SMichail Sermon  Patient was given the opportunity to ask questions.  Patient verbalized understanding of the plan and was able to repeat key elements of the plan.   Orders Placed This Encounter  Procedures   Flu Vaccine QUAD 650moM (Fluarix, Fluzone & Alfiuria Quad PF)   CBC   Comprehensive metabolic panel   Lipid panel   Microalbumin / creatinine urine ratio   Ambulatory referral to Ophthalmology   Amb ref to Medical Nutrition Therapy-MNT   POCT  glucose (manual entry)   POCT glycosylated hemoglobin (Hb A1C)     Requested Prescriptions    No prescriptions requested or ordered in this encounter    Return in about 4 months (around 05/27/2021) for Sign release to get colonoscopy report from Dr. ScMichail Sermonnd [facility]  DeKarle PlumberMD, FACP

## 2021-01-26 LAB — MICROALBUMIN / CREATININE URINE RATIO
Creatinine, Urine: 61.1 mg/dL
Microalb/Creat Ratio: <5 mg/g creat (ref 0–29)
Microalbumin, Urine: <3 ug/mL

## 2021-01-26 LAB — COMPREHENSIVE METABOLIC PANEL
ALT: 14 IU/L (ref 0–32)
AST: 24 IU/L (ref 0–40)
Albumin/Globulin Ratio: 1.9 (ref 1.2–2.2)
Albumin: 4.9 g/dL — ABNORMAL HIGH (ref 3.8–4.8)
Alkaline Phosphatase: 68 IU/L (ref 44–121)
BUN/Creatinine Ratio: 12 (ref 12–28)
BUN: 12 mg/dL (ref 8–27)
Bilirubin Total: 0.3 mg/dL (ref 0.0–1.2)
CO2: 24 mmol/L (ref 20–29)
Calcium: 10.2 mg/dL (ref 8.7–10.3)
Chloride: 100 mmol/L (ref 96–106)
Creatinine, Ser: 0.97 mg/dL (ref 0.57–1.00)
Globulin, Total: 2.6 g/dL (ref 1.5–4.5)
Glucose: 92 mg/dL (ref 65–99)
Potassium: 4.4 mmol/L (ref 3.5–5.2)
Sodium: 141 mmol/L (ref 134–144)
Total Protein: 7.5 g/dL (ref 6.0–8.5)
eGFR: 64 mL/min/{1.73_m2} (ref >59)

## 2021-01-26 LAB — CBC
Hematocrit: 39.8 % (ref 34.0–46.6)
Hemoglobin: 13.2 g/dL (ref 11.1–15.9)
MCH: 27.2 pg (ref 26.6–33.0)
MCHC: 33.2 g/dL (ref 31.5–35.7)
MCV: 82 fL (ref 79–97)
Platelets: 344 10*3/uL (ref 150–450)
RBC: 4.85 x10E6/uL (ref 3.77–5.28)
RDW: 14.5 % (ref 11.7–15.4)
WBC: 8.2 10*3/uL (ref 3.4–10.8)

## 2021-01-26 LAB — LIPID PANEL
Chol/HDL Ratio: 3.2 ratio (ref 0.0–4.4)
Cholesterol, Total: 165 mg/dL (ref 100–199)
HDL: 51 mg/dL (ref >39)
LDL Chol Calc (NIH): 88 mg/dL (ref 0–99)
Triglycerides: 148 mg/dL (ref 0–149)
VLDL Cholesterol Cal: 26 mg/dL (ref 5–40)

## 2021-01-29 ENCOUNTER — Ambulatory Visit (INDEPENDENT_AMBULATORY_CARE_PROVIDER_SITE_OTHER): Payer: Medicare Other

## 2021-01-29 DIAGNOSIS — J309 Allergic rhinitis, unspecified: Secondary | ICD-10-CM | POA: Diagnosis not present

## 2021-01-31 ENCOUNTER — Other Ambulatory Visit: Payer: Self-pay

## 2021-01-31 ENCOUNTER — Encounter: Payer: Self-pay | Admitting: Dietician

## 2021-01-31 ENCOUNTER — Encounter: Payer: Medicare Other | Attending: Internal Medicine | Admitting: Dietician

## 2021-01-31 DIAGNOSIS — E119 Type 2 diabetes mellitus without complications: Secondary | ICD-10-CM | POA: Diagnosis not present

## 2021-01-31 NOTE — Patient Instructions (Signed)
Work towards eating three meals a day, about 5-6 hours apart!  Begin to recognize carbohydrates in your food choices!  Begin to build your meals using the proportions of the Balanced Plate. First, select your carb choice(s) for the meal. Next, select your source of protein to pair with your carb choice(s). Finally, complete the remaining half of your meal with a variety of non-starchy vegetables.  Keep up the great work walking!  Check your blood sugar every morning fasting, and 2 hours after you start eating a meal.  Look for numbers between 70-100 fasting, and between 140 - 160 after a meal. Bring your meter to our next visit!

## 2021-01-31 NOTE — Progress Notes (Signed)
Diabetes Self-Management Education  Visit Type: First/Initial  Appt. Start Time: 1645 Appt. End Time: [id number]  01/31/2021  [name], identified by name and date of birth, is a 65 y.o. female with a diagnosis of Diabetes: Type 2.   ASSESSMENT Pt has had previous diabetes education in August of 2022. Pt wants a refresher on how food affects their blood sugar readings. Pt is not on any diabetes medications, and does not want to start any. Pt reports checking their blood sugar 3x a day. Pt checks fasting, before lunch, and before bed.  Pt reports walking 3 times a week for 30-45 minutes. Pt states they have lower back pain that comes and goes and can interfere with their ability to be active. Pt reports trying to eat more vegetables, eats mustard greens, tomatoes, squash. Pt has been avoiding bread and eating meat and vegetables for meals.  There were no vitals taken for this visit. There is no height or weight on file to calculate BMI.   Diabetes Self-Management Education - 01/31/21 1703       Visit Information   Visit Type First/Initial      Initial Visit   Diabetes Type Type 2    Are you currently following a meal plan? No    Are you taking your medications as prescribed? Not on Medications    Date Diagnosed 2014      Health Coping   How would you rate your overall health? Good      Psychosocial Assessment   Patient Belief/Attitude about Diabetes Motivated to manage diabetes   Pt reports difficulty avoiding foods they shouldn't be eating.   Self-management support Doctor's office;Family    Other persons present Patient    Patient Concerns Nutrition/Meal planning;Glycemic Control    Special Needs None    Preferred Learning Style No preference indicated    Learning Readiness Ready    How often do you need to have someone help you when you read instructions, pamphlets, or other written materials from your doctor or pharmacy? 2 - Rarely    What is the last grade level you  completed in school? [employer] 3 years      Pre-Education Assessment   Patient understands the diabetes disease and treatment process. Needs Review    Patient understands incorporating nutritional management into lifestyle. Needs Review    Patient undertands incorporating physical activity into lifestyle. Needs Review    Patient understands using medications safely. Needs Review    Patient understands monitoring blood glucose, interpreting and using results Needs Review    Patient understands prevention, detection, and treatment of acute complications. Needs Review    Patient understands prevention, detection, and treatment of chronic complications. Needs Review    Patient understands how to develop strategies to address psychosocial issues. Needs Review    Patient understands how to develop strategies to promote health/change behavior. Needs Review      Complications   Last HgB A1C per patient/outside source 5.9 %   01/25/2021   How often do you check your blood sugar? 3-4 times/day    Fasting Blood glucose range (mg/dL) 70-129    Postprandial Blood glucose range (mg/dL) 130-179    Number of hypoglycemic episodes per month 0    Number of hyperglycemic episodes per week 0    Have you had a dilated eye exam in the past 12 months? No   Has an appointment in October   Have you had a dental exam in the past 12  months? Yes    Are you checking your feet? Yes    How many days per week are you checking your feet? 7   Pt visits podiatrist regularly     Dietary Intake   Breakfast Fruit and granola parfait    Lunch 2 hot dogs w/ baked beans    Dinner Bologna sandwich, tomatoes, mayonnaise      Exercise   Exercise Type ADL's;Light (walking / raking leaves)    How many days per week to you exercise? 3    How many minutes per day do you exercise? 30    Total minutes per week of exercise 90      Patient Education   Previous Diabetes Education Yes (please comment)   2020 @ [facility]   Disease state   Factors that contribute to the development of diabetes    Nutrition management  Role of diet in the treatment of diabetes and the relationship between the three main macronutrients and blood glucose level;Carbohydrate counting;Food label reading, portion sizes and measuring food.;Reviewed blood glucose goals for pre and post meals and how to evaluate the patients' food intake on their blood glucose level.    Physical activity and exercise  Role of exercise on diabetes management, blood pressure control and cardiac health.    Medications Reviewed patients medication for diabetes, action, purpose, timing of dose and side effects.    Monitoring Purpose and frequency of SMBG.;Identified appropriate SMBG and/or A1C goals.    Chronic complications Relationship between chronic complications and blood glucose control;Assessed and discussed foot care and prevention of foot problems;Retinopathy and reason for yearly dilated eye exams      Individualized Goals (developed by patient)   Nutrition Follow meal plan discussed    Physical Activity Exercise 3-5 times per week    Medications Not Applicable    Monitoring  test my blood glucose as discussed;send in my blood glucose log as discussed   bring meter to next appointment     Post-Education Assessment   Patient understands the diabetes disease and treatment process. Needs Review    Patient understands incorporating nutritional management into lifestyle. Needs Review    Patient undertands incorporating physical activity into lifestyle. Demonstrates understanding / competency    Patient understands monitoring blood glucose, interpreting and using results Needs Review    Patient understands prevention, detection, and treatment of acute complications. Needs Review    Patient understands prevention, detection, and treatment of chronic complications. Needs Review    Patient understands how to develop strategies to address psychosocial issues. Needs Review     Patient understands how to develop strategies to promote health/change behavior. Needs Review      Outcomes   Expected Outcomes Demonstrated interest in learning. Expect positive outcomes    Future DMSE 2 months    Program Status Not Completed             Individualized Plan for Diabetes Self-Management Training:   Learning Objective:  Patient will have a greater understanding of diabetes self-management. Patient education plan is to attend individual and/or group sessions per assessed needs and concerns.   Plan:   Patient Instructions  Work towards eating three meals a day, about 5-6 hours apart!  Begin to recognize carbohydrates in your food choices!  Begin to build your meals using the proportions of the Balanced Plate. First, select your carb choice(s) for the meal. Next, select your source of protein to pair with your carb choice(s). Finally, complete the remaining half of  your meal with a variety of non-starchy vegetables.  Keep up the great work walking!  Check your blood sugar every morning fasting, and 2 hours after you start eating a meal.  Look for numbers between 70-100 fasting, and between 140 - 160 after a meal. Bring your meter to our next visit!   Expected Outcomes:  Demonstrated interest in learning. Expect positive outcomes  Education material provided: Meal plan card and My Plate  If problems or questions, patient to contact team via:  Phone and Email  Future DSME appointment: 2 months

## 2021-02-05 ENCOUNTER — Ambulatory Visit (INDEPENDENT_AMBULATORY_CARE_PROVIDER_SITE_OTHER): Payer: Medicare Other | Admitting: *Deleted

## 2021-02-05 DIAGNOSIS — J309 Allergic rhinitis, unspecified: Secondary | ICD-10-CM

## 2021-02-12 ENCOUNTER — Ambulatory Visit (INDEPENDENT_AMBULATORY_CARE_PROVIDER_SITE_OTHER): Payer: Medicare Other | Admitting: *Deleted

## 2021-02-12 DIAGNOSIS — J309 Allergic rhinitis, unspecified: Secondary | ICD-10-CM

## 2021-02-13 DIAGNOSIS — Z20822 Contact with and (suspected) exposure to covid-19: Secondary | ICD-10-CM | POA: Diagnosis not present

## 2021-02-19 ENCOUNTER — Encounter: Payer: Self-pay | Admitting: Allergy & Immunology

## 2021-02-19 ENCOUNTER — Other Ambulatory Visit: Payer: Self-pay

## 2021-02-19 ENCOUNTER — Ambulatory Visit (INDEPENDENT_AMBULATORY_CARE_PROVIDER_SITE_OTHER): Payer: Medicare Other | Admitting: Allergy & Immunology

## 2021-02-19 ENCOUNTER — Ambulatory Visit (INDEPENDENT_AMBULATORY_CARE_PROVIDER_SITE_OTHER): Payer: Medicare Other | Admitting: *Deleted

## 2021-02-19 VITALS — BP 124/82 | HR 68 | Temp 98.0°F | Resp 18 | Ht 68.0 in | Wt 291.2 lb

## 2021-02-19 DIAGNOSIS — J4540 Moderate persistent asthma, uncomplicated: Secondary | ICD-10-CM

## 2021-02-19 DIAGNOSIS — J3089 Other allergic rhinitis: Secondary | ICD-10-CM | POA: Diagnosis not present

## 2021-02-19 DIAGNOSIS — J309 Allergic rhinitis, unspecified: Secondary | ICD-10-CM | POA: Diagnosis not present

## 2021-02-19 NOTE — Progress Notes (Signed)
FOLLOW UP  Date of Service/Encounter:  02/19/21   Assessment:   Moderate persistent asthma - with improved spirometry today   Perennial allergic rhinitis (dust mites, cockroach, ragweed) - on allergen immunotherapy with maintenance reached August 2020   Complex medical history   Fully vaccinated for [facility], including the latest bivalent booster  Plan/Recommendations:   1. Moderate persistent asthma, uncomplicated - Lung function looks excellent today. - We are not going to make any changes today. - Daily controller medication(s): Symbicort 160/4.57mcg two puffs twice daily with spacer - Prior to physical activity: albuterol 2 puffs 10-15 minutes before physical activity. - Rescue medications: albuterol 4 puffs every 4-6 hours as needed - Asthma control goals:  * Full participation in all desired activities (may need albuterol before activity) * Albuterol use two time or less a week on average (not counting use with activity) * Cough interfering with sleep two time or less a month * Oral steroids no more than once a year * No hospitalizations  2. Perennial allergic rhinitis (dust mites, cockroach) - Continue with allergy shots at the same schedule (every two weeks).  - Continue with cetirizine 10 mg daily. - Continue with Flonase 1 to 2 sprays per nostril ONCE DAILY.  - Continue with Astelin 1 to 2 sprays per nostril ONCE DAILY.   3. Return in about 6 months (around 08/19/2021).     Subjective:   Cheryl [name] is a 65 y.o. female presenting today for follow up of  Chief Complaint  Patient presents with   Follow-up    Nila C Friesenhahn has a history of the following: Patient Active Problem List   Diagnosis Date Noted   Hyperlipidemia associated with type 2 diabetes mellitus ([place]) 01/25/2021   Type 2 diabetes mellitus with morbid obesity ([place]) 01/25/2021   Moderate persistent asthma, uncomplicated 81/15/7262   OSA (obstructive sleep apnea) 12/02/2016   Hyperlipidemia  LDL goal <100 10/21/2014   Essential hypertension, benign 03/55/9741   DM w/o complication type II ([place]) 09/13/2014   Routine general medical examination at a health care facility 10/12/2013   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 05/03/2013   Need for prophylactic vaccination and inoculation against influenza 02/08/2013   GERD (gastroesophageal reflux disease) 02/08/2013   Other and unspecified hyperlipidemia 11/10/2012   Type II or unspecified type diabetes mellitus without mention of complication, not stated as uncontrolled 10/25/2012   Impaired fasting glucose 09/07/2012   Morbid obesity ([place]) 09/07/2012   Generalized osteoarthrosis, involving multiple sites 09/07/2012   Carpal tunnel syndrome 09/07/2012   Other, multiple, and unspecified sites, insect bite, nonvenomous, without mention of infection(919.4) 09/07/2012   Special screening for malignant neoplasms, colon 09/07/2012   Other specified cardiac dysrhythmias(427.89) 09/07/2012   Candidiasis of vulva and vagina 09/07/2012   Abdominal pain, generalized 09/07/2012   Symptomatic menopausal or female climacteric states 09/07/2012   Hypertension associated with diabetes ([place]) 09/07/2012   Perennial allergic rhinitis 09/07/2012   Pain in joint, site unspecified 09/07/2012   Other malaise and fatigue 09/07/2012    History obtained from: chart review and patient.  Rebbie is a 65 y.o. female presenting for a follow up visit.  She has done well. She is now walking more regularly.  She was last seen in March 2022.  At that time, lung function looked normal.  We continue with Symbicort 2 puffs twice daily with spacer.  For her allergic rhinitis, she continue with the same schedule.  We continue with cetirizine as well  as Flonase and Astelin.  She had a lesion on the top of her sternum.  She previously had a chest x-ray that was consistent with arthritis.  Therefore, I was not too concerned.  Since last visit, she has done  well.  Asthma/Respiratory Symptom History: She remains on the Symbicort 2 puffs in the morning and 2 puffs at night.  She has not been using the albuterol too often.  She has been doing more walking.  She feels like she has lost some weight.  She has been walking mostly downtown. She has not needed prednisone.  Allergic Rhinitis Symptom History: She has been on the cetirizine daily.  She does not use iton a routine basis. . Allergy shots are going well.  She felt that they have helped.  She has not needed antibiotics.  Kristene is on allergen immunotherapy. She receives one injection. Immunotherapy script #1 contains ragweed, dust mites and cockroach. She currently receives 0.54mL of the RED vial (1/100). She started shots March of 2020 and reached maintenance in August of 2020. She has had no large local reactions.   She had surgery on both of her shoulders. One was 2013 and the other one was close.   Otherwise, there have been no changes to her past medical history, surgical history, family history, or social history.    Review of Systems  Constitutional: Negative.  Negative for chills, fever, malaise/fatigue and weight loss.  HENT: Negative.  Negative for congestion, ear discharge, ear pain and sinus pain.   Eyes:  Negative for pain, discharge and redness.  Respiratory:  Negative for cough, sputum production, shortness of breath and wheezing.   Cardiovascular: Negative.  Negative for chest pain and palpitations.  Gastrointestinal:  Negative for abdominal pain, constipation, diarrhea, heartburn, nausea and vomiting.  Skin: Negative.  Negative for itching and rash.  Neurological:  Negative for dizziness and headaches.  Endo/Heme/Allergies:  Negative for environmental allergies. Does not bruise/bleed easily.      Objective:   Blood pressure 124/82, pulse 68, temperature 98 F (36.7 C), temperature source Temporal, resp. rate 18, height 5\' 8"  (1.727 m), weight 291 lb 4 oz (132.1 kg), SpO2 97  %. Body mass index is 44.28 kg/m.   Physical Exam:  Physical Exam Vitals reviewed.  Constitutional:      Appearance: She is well-developed.  HENT:     Head: Normocephalic and atraumatic.     Right Ear: Tympanic membrane, ear canal and external ear normal. No drainage, swelling or tenderness. Tympanic membrane is not injected, scarred, erythematous, retracted or bulging.     Left Ear: Tympanic membrane, ear canal and external ear normal. No drainage, swelling or tenderness. Tympanic membrane is not injected, scarred, erythematous, retracted or bulging.     Nose: No nasal deformity, septal deviation, mucosal edema or rhinorrhea.     Right Turbinates: Enlarged.     Left Turbinates: Enlarged.     Right Sinus: No maxillary sinus tenderness or frontal sinus tenderness.     Left Sinus: No maxillary sinus tenderness or frontal sinus tenderness.     Mouth/Throat:     Mouth: Mucous membranes are not pale and not dry.     Pharynx: Uvula midline.  Eyes:     General:        Right eye: No discharge.        Left eye: No discharge.     Conjunctiva/sclera: Conjunctivae normal.     Right eye: Right conjunctiva is not injected. No chemosis.  Left eye: Left conjunctiva is not injected. No chemosis.    Pupils: Pupils are equal, round, and reactive to light.  Cardiovascular:     Rate and Rhythm: Normal rate and regular rhythm.     Heart sounds: Normal heart sounds.  Pulmonary:     Effort: Pulmonary effort is normal. No tachypnea, accessory muscle usage or respiratory distress.     Breath sounds: Normal breath sounds. No stridor, decreased air movement or transmitted upper airway sounds. No wheezing, rhonchi or rales.  Chest:     Chest wall: No tenderness.  Abdominal:     Tenderness: There is no abdominal tenderness. There is no guarding or rebound.  Lymphadenopathy:     Head:     Right side of head: No submandibular, tonsillar or occipital adenopathy.     Left side of head: No submandibular,  tonsillar or occipital adenopathy.     Cervical: No cervical adenopathy.  Skin:    Coloration: Skin is not pale.     Findings: No abrasion, erythema, petechiae or rash. Rash is not papular, urticarial or vesicular.  Neurological:     Mental Status: She is alert.  Psychiatric:        Behavior: Behavior is cooperative.     Diagnostic studies:    Spirometry: results normal (FEV1: 1.54/69%, FVC: 1.98/69%, FEV1/FVC: 78%).    Spirometry consistent with possible restrictive disease.   Allergy Studies: none       Salvatore Marvel, MD  [facility]

## 2021-02-19 NOTE — Patient Instructions (Addendum)
1. Moderate persistent asthma, uncomplicated - Lung function looks excellent today. - We are not going to make any changes today. - Daily controller medication(s): Symbicort 160/4.61mcg two puffs twice daily with spacer - Prior to physical activity: albuterol 2 puffs 10-15 minutes before physical activity. - Rescue medications: albuterol 4 puffs every 4-6 hours as needed - Asthma control goals:  * Full participation in all desired activities (may need albuterol before activity) * Albuterol use two time or less a week on average (not counting use with activity) * Cough interfering with sleep two time or less a month * Oral steroids no more than once a year * No hospitalizations  2. Perennial allergic rhinitis (dust mites, cockroach) - Continue with allergy shots at the same schedule (every two weeks).  - Continue with cetirizine 10 mg daily. - Continue with Flonase 1 to 2 sprays per nostril ONCE DAILY.  - Continue with Astelin 1 to 2 sprays per nostril ONCE DAILY.   3. Return in about 6 months (around 08/19/2021).    Please inform us of any Emergency Department visits, hospitalizations, or changes in symptoms. Call us before going to the ED for breathing or allergy symptoms since we might be able to fit you in for a sick visit. Feel free to contact us anytime with any questions, problems, or concerns.  It was a pleasure to see you again today!  Websites that have reliable patient information: 1. American Academy of Asthma, Allergy, and Immunology: www.aaaai.org 2. Food Allergy Research and Education (FARE): foodallergy.org 3. Mothers of Asthmatics: http://www.asthmacommunitynetwork.org 4. American College of Allergy, Asthma, and Immunology: www.acaai.org   COVID-19 Vaccine Information can be found at: ShippingScam.co.uk For questions related to vaccine distribution or appointments, please email [email or web address] or call  [phone].   We realize that you might be concerned about having an allergic reaction to the COVID19 vaccines. To help with that concern, WE ARE OFFERING THE COVID19 VACCINES IN OUR OFFICE! Ask the front desk for dates!     "Like" [place] on Facebook and Instagram for our latest updates!      A healthy democracy works best when [employer] participate! Make sure you are registered to vote! If you have moved or changed any of your contact information, you will need to get this updated before voting!  In some cases, you MAY be able to register to vote online: [email or web address]

## 2021-02-20 ENCOUNTER — Encounter: Payer: Self-pay | Admitting: Allergy & Immunology

## 2021-02-20 MED ORDER — LEVOCETIRIZINE DIHYDROCHLORIDE 5 MG PO TABS: 5.0000 mg | ORAL_TABLET | Freq: Every evening | ORAL | 2 refills | Status: DC

## 2021-02-20 MED ORDER — FLUTICASONE PROPIONATE 50 MCG/ACT NA SUSP: 1.0000 | Freq: Every day | NASAL | 2 refills | Status: DC

## 2021-02-20 MED ORDER — AZELASTINE HCL 0.1 % NA SOLN: NASAL | 5 refills | Status: DC

## 2021-02-20 MED ORDER — ALBUTEROL SULFATE HFA 108 (90 BASE) MCG/ACT IN AERS: 2.0000 | INHALATION_SPRAY | Freq: Four times a day (QID) | RESPIRATORY_TRACT | 1 refills | Status: DC | PRN

## 2021-02-20 MED ORDER — BUDESONIDE-FORMOTEROL FUMARATE 160-4.5 MCG/ACT IN AERO: INHALATION_SPRAY | RESPIRATORY_TRACT | 5 refills | Status: DC

## 2021-02-25 LAB — HM DIABETES EYE EXAM

## 2021-03-12 ENCOUNTER — Ambulatory Visit (INDEPENDENT_AMBULATORY_CARE_PROVIDER_SITE_OTHER): Payer: Medicare Other

## 2021-03-12 DIAGNOSIS — J309 Allergic rhinitis, unspecified: Secondary | ICD-10-CM

## 2021-03-17 NOTE — Assessment & Plan Note (Signed)
Recent flare with cough, responded to her meds. Plan- f/u with Dr Vaughan Browner

## 2021-03-17 NOTE — Assessment & Plan Note (Signed)
Benefits from CPAP Plan- refer for mask fitting. Continue auto 5-15

## 2021-03-27 DIAGNOSIS — I739 Peripheral vascular disease, unspecified: Secondary | ICD-10-CM | POA: Diagnosis not present

## 2021-03-27 DIAGNOSIS — E1142 Type 2 diabetes mellitus with diabetic polyneuropathy: Secondary | ICD-10-CM | POA: Diagnosis not present

## 2021-03-27 DIAGNOSIS — L603 Nail dystrophy: Secondary | ICD-10-CM | POA: Diagnosis not present

## 2021-03-27 DIAGNOSIS — L84 Corns and callosities: Secondary | ICD-10-CM | POA: Diagnosis not present

## 2021-04-02 ENCOUNTER — Other Ambulatory Visit: Payer: Self-pay

## 2021-04-02 ENCOUNTER — Encounter: Payer: Self-pay | Admitting: Dietician

## 2021-04-02 ENCOUNTER — Ambulatory Visit (INDEPENDENT_AMBULATORY_CARE_PROVIDER_SITE_OTHER): Payer: Medicare Other | Admitting: *Deleted

## 2021-04-02 ENCOUNTER — Encounter: Payer: Medicare Other | Attending: Internal Medicine | Admitting: Dietician

## 2021-04-02 DIAGNOSIS — E1169 Type 2 diabetes mellitus with other specified complication: Secondary | ICD-10-CM | POA: Insufficient documentation

## 2021-04-02 DIAGNOSIS — J309 Allergic rhinitis, unspecified: Secondary | ICD-10-CM | POA: Diagnosis not present

## 2021-04-02 NOTE — Patient Instructions (Addendum)
Use your "Fruits" list to consume about 2-3 servings of fruit each day  Try some low-sugar or sugar free juices in place of your pomegranate juices.  Have a Glucerna in the evening if you do not have a meal.  Look for Lactaid brand milk in the 32 oz container.  Try Oikos Triple Zero or Danon Light n' Fit greek yogurt  Check your blood sugar 2 hours after your lunch. Look for numbers to stay under 180.

## 2021-04-02 NOTE — Progress Notes (Signed)
Diabetes Self-Management Education  Visit Type: Follow-up  Appt. Start Time: 1400 Appt. End Time: 1430  04/02/2021  [name], identified by name and date of birth, is a 65 y.o. female with a diagnosis of Diabetes:  .   ASSESSMENT Pt reports increase in lower back pain as the weather has gotten cooler. Pt is still able to do their walks 2-3 times a week, tries to get at least 5,000 steps in each day. Pt reports trying to decrease their caffeine consumption, states it has been difficult. Pt reports feeling better when they don't drink any. Pt reports increasing disturbances in sleep, wakes up in the middle of the night. Pt believes cutting back on caffeine may help. Pt is checking FBG and right before lunch. FBG ranges - 100- 120, CBG around the same. Pt reports eating more fruits, eating whole grain bread, plant based butter, drinking 64 oz of water daily.  There were no vitals taken for this visit. There is no height or weight on file to calculate BMI.   Diabetes Self-Management Education - 04/02/21 1407       Visit Information   Visit Type Follow-up      Complications   How often do you check your blood sugar? 1-2 times/day    Fasting Blood glucose range (mg/dL) 70-129    Postprandial Blood glucose range (mg/dL) 130-179      Dietary Intake   Breakfast Oatmeal with apple, pomegranite peach juice    Lunch collard greens, cream potatoes w/ sour cream, hamburger patties    Dinner Chicken nuggets, olive garden salad    Beverage(s) juice, water      Exercise   Exercise Type ADL's;Light (walking / raking leaves)    How many days per week to you exercise? 2    How many minutes per day do you exercise? 30    Total minutes per week of exercise 60      Individualized Goals (developed by patient)   Nutrition Follow meal plan discussed    Physical Activity Exercise 3-5 times per week    Monitoring  test my blood glucose as discussed;send in my blood glucose log as discussed       Patient Self-Evaluation of Goals - Patient rates self as meeting previously set goals (% of time)   Nutrition 25 - 50%    Physical Activity 25 - 50%    Medications Not Applicable    Monitoring 50 - 75 %    Problem Solving 25 - 50%    Reducing Risk 25 - 50%    Health Coping 25 - 50%      Post-Education Assessment   Patient understands the diabetes disease and treatment process. Needs Review    Patient understands incorporating nutritional management into lifestyle. Needs Review    Patient undertands incorporating physical activity into lifestyle. Needs Review    Patient understands monitoring blood glucose, interpreting and using results Needs Review    Patient understands prevention, detection, and treatment of acute complications. Needs Review    Patient understands prevention, detection, and treatment of chronic complications. Needs Review    Patient understands how to develop strategies to address psychosocial issues. Needs Review    Patient understands how to develop strategies to promote health/change behavior. Needs Review      Outcomes   Expected Outcomes Demonstrated interest in learning. Expect positive outcomes    Future DMSE 2 months    Program Status Not Completed      Subsequent Visit  Since your last visit have you continued or begun to take your medications as prescribed? Not on Medications    Since your last visit have you had your blood pressure checked? Yes    Is your most recent blood pressure lower, unchanged, or higher since your last visit? Unchanged    Since your last visit have you experienced any weight changes? No change    Since your last visit, are you checking your blood glucose at least once a day? Yes             Individualized Plan for Diabetes Self-Management Training:   Learning Objective:  Patient will have a greater understanding of diabetes self-management. Patient education plan is to attend individual and/or group sessions per  assessed needs and concerns.   Plan:   Patient Instructions  Use your "Fruits" list to consume about 2-3 servings of fruit each day  Try some low-sugar or sugar free juices in place of your pomegranate juices.  Have a Glucerna in the evening if you do not have a meal.  Look for Lactaid brand milk in the 32 oz container.  Try Oikos Triple Zero or Danon Light n' Fit greek yogurt  Check your blood sugar 2 hours after your lunch. Look for numbers to stay under 180.  Expected Outcomes:  Demonstrated interest in learning. Expect positive outcomes  Education material provided: Fruits List  If problems or questions, patient to contact team via:  Phone and Email  Future DSME appointment: 2 months

## 2021-04-08 DIAGNOSIS — U071 COVID-19: Secondary | ICD-10-CM | POA: Diagnosis not present

## 2021-04-23 ENCOUNTER — Ambulatory Visit (INDEPENDENT_AMBULATORY_CARE_PROVIDER_SITE_OTHER): Payer: Medicare Other | Admitting: *Deleted

## 2021-04-23 DIAGNOSIS — J309 Allergic rhinitis, unspecified: Secondary | ICD-10-CM | POA: Diagnosis not present

## 2021-05-02 DIAGNOSIS — J3089 Other allergic rhinitis: Secondary | ICD-10-CM | POA: Diagnosis not present

## 2021-05-07 DIAGNOSIS — R102 Pelvic and perineal pain: Secondary | ICD-10-CM | POA: Diagnosis not present

## 2021-05-07 DIAGNOSIS — R3982 Chronic bladder pain: Secondary | ICD-10-CM | POA: Diagnosis not present

## 2021-05-07 NOTE — Progress Notes (Signed)
VIAL MADE. EXP 05-07-22

## 2021-05-14 ENCOUNTER — Encounter: Payer: Self-pay | Admitting: Internal Medicine

## 2021-05-14 ENCOUNTER — Ambulatory Visit (INDEPENDENT_AMBULATORY_CARE_PROVIDER_SITE_OTHER): Payer: Medicare Other | Admitting: *Deleted

## 2021-05-14 DIAGNOSIS — J309 Allergic rhinitis, unspecified: Secondary | ICD-10-CM

## 2021-05-15 ENCOUNTER — Encounter: Payer: Self-pay | Admitting: Internal Medicine

## 2021-05-15 NOTE — Progress Notes (Signed)
Patient seen by [facility] Dr. Gorden Harms on 05/07/2021.  Patient diagnosed with chronic pelvic pain syndrome/pelvic floor dysfunction.  He recommended pelvic floor physical therapy.  Patient was placed on intravaginal Valium as well as meloxicam.  Hopefully she will be able to wean this medication as she progresses through physical therapy.  Valium 10 mg 1 tablet per vagina nightly #30 tabs with 2 refills.  Meloxicam 15 mg 1 tablet p.o. as needed #30 tabs with 2 refills.

## 2021-05-21 ENCOUNTER — Other Ambulatory Visit: Payer: Self-pay | Admitting: Internal Medicine

## 2021-05-21 ENCOUNTER — Other Ambulatory Visit: Payer: Self-pay

## 2021-05-21 MED ORDER — FLUTICASONE PROPIONATE 50 MCG/ACT NA SUSP: 1.0000 | Freq: Every day | NASAL | 2 refills | Status: DC

## 2021-05-22 DIAGNOSIS — M6281 Muscle weakness (generalized): Secondary | ICD-10-CM | POA: Diagnosis not present

## 2021-05-22 DIAGNOSIS — N3941 Urge incontinence: Secondary | ICD-10-CM | POA: Diagnosis not present

## 2021-05-22 DIAGNOSIS — R3982 Chronic bladder pain: Secondary | ICD-10-CM | POA: Diagnosis not present

## 2021-05-22 DIAGNOSIS — R3914 Feeling of incomplete bladder emptying: Secondary | ICD-10-CM | POA: Diagnosis not present

## 2021-05-22 DIAGNOSIS — M6289 Other specified disorders of muscle: Secondary | ICD-10-CM | POA: Diagnosis not present

## 2021-05-22 DIAGNOSIS — M62838 Other muscle spasm: Secondary | ICD-10-CM | POA: Diagnosis not present

## 2021-05-22 DIAGNOSIS — R102 Pelvic and perineal pain: Secondary | ICD-10-CM | POA: Diagnosis not present

## 2021-05-28 ENCOUNTER — Ambulatory Visit: Payer: Medicare Other | Attending: Internal Medicine | Admitting: Internal Medicine

## 2021-05-28 ENCOUNTER — Encounter: Payer: Self-pay | Admitting: Internal Medicine

## 2021-05-28 ENCOUNTER — Other Ambulatory Visit: Payer: Self-pay

## 2021-05-28 ENCOUNTER — Telehealth: Payer: Self-pay | Admitting: Internal Medicine

## 2021-05-28 DIAGNOSIS — N9489 Other specified conditions associated with female genital organs and menstrual cycle: Secondary | ICD-10-CM | POA: Diagnosis not present

## 2021-05-28 DIAGNOSIS — E1159 Type 2 diabetes mellitus with other circulatory complications: Secondary | ICD-10-CM

## 2021-05-28 DIAGNOSIS — I152 Hypertension secondary to endocrine disorders: Secondary | ICD-10-CM

## 2021-05-28 DIAGNOSIS — E1169 Type 2 diabetes mellitus with other specified complication: Secondary | ICD-10-CM | POA: Diagnosis not present

## 2021-05-28 DIAGNOSIS — Z23 Encounter for immunization: Secondary | ICD-10-CM | POA: Diagnosis not present

## 2021-05-28 DIAGNOSIS — E785 Hyperlipidemia, unspecified: Secondary | ICD-10-CM

## 2021-05-28 NOTE — Progress Notes (Signed)
Patient ID: Cheryl [name], female    DOB: 1955/06/23  MRN: [id number]  CC: Hypertension   Subjective: Cheryl [name] is a 66 y.o. female who presents for chronic ds management Her concerns today include:  Pt with hx of DM type 2, HTN, HL, moderate persistent asthma, OSA on CPAP, morbid obesity, arthritis/LBP, chronic pelvic pain syndrome/pelvic floor dysfunction followed by urology Dr. Gorden Harms.  Saw Dr. Gorden Harms for chronic pelvic pain syndrome/pelvic floor dysfunction.  Doing pelvic floor exercises as she was taught.  Placed on intravaginal Valium and Meloxicam.  She finds the medication helps  DM/obesity:  saw nutritionist since last visit. Found it helpful and trying to follow recommendations.  Not eating as much. Checks BS before BF and lunch.  Gives range 100-125.  She is not on any medications. -not doing any exercise but plans to start back. Has a TM and stationary bike at home.   HYPERTENSION Currently taking: see medication list Med Adherence: [x]  Yes Lisinopril, Triam/HCTZ   []  No Medication side effects: []  Yes    [x]  No Adherence with salt restriction: [x]  Yes    []  No Home Monitoring?: [x]  Yes    []  No Monitoring Frequency: once a wk but does not have log with her Home BP results range: usually 120s/70-80s SOB? []  Yes    [x]  No Chest Pain?: []  Yes    [x]  No Leg swelling?: [x]  Yes a little   []  No Headaches?: []  Yes    [x]  No Dizziness? []  Yes    [x]  No Comments:   HL:  taking and tolerating Zocor  HM:  she was suppose to sign a release for [place] to get c-scope report from Dr. Michail Sermon.  Done 2021.  Told she will be due again in 10 yrs.  Showing due for MMG on HM but pt states it is not due until May of this yr.  Gets it through Dr. Charlesetta Garibaldi ant [facility] Patient Active Problem List   Diagnosis Date Noted   Hyperlipidemia associated with type 2 diabetes mellitus ([place]) 01/25/2021   Type 2 diabetes mellitus with morbid obesity ([place]) 01/25/2021   Moderate  persistent asthma, uncomplicated 57/84/6962   OSA (obstructive sleep apnea) 12/02/2016   Hyperlipidemia LDL goal <100 10/21/2014   Essential hypertension, benign 95/28/4132   DM w/o complication type II ([place]) 09/13/2014   Routine general medical examination at a health care facility 10/12/2013   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 05/03/2013   Need for prophylactic vaccination and inoculation against influenza 02/08/2013   GERD (gastroesophageal reflux disease) 02/08/2013   Other and unspecified hyperlipidemia 11/10/2012   Type II or unspecified type diabetes mellitus without mention of complication, not stated as uncontrolled 10/25/2012   Impaired fasting glucose 09/07/2012   Morbid obesity ([place]) 09/07/2012   Generalized osteoarthrosis, involving multiple sites 09/07/2012   Carpal tunnel syndrome 09/07/2012   Other, multiple, and unspecified sites, insect bite, nonvenomous, without mention of infection(919.4) 09/07/2012   Special screening for malignant neoplasms, colon 09/07/2012   Other specified cardiac dysrhythmias(427.89) 09/07/2012   Candidiasis of vulva and vagina 09/07/2012   Abdominal pain, generalized 09/07/2012   Symptomatic menopausal or female climacteric states 09/07/2012   Hypertension associated with diabetes ([place]) 09/07/2012   Perennial allergic rhinitis 09/07/2012   Pain in joint, site unspecified 09/07/2012   Other malaise and fatigue 09/07/2012     Current Outpatient Medications on File Prior to Visit  Medication Sig Dispense Refill   ACCU-CHEK AVIVA PLUS  test strip CHECK BLOOD SUGAR ONCE DAILY AS INSTRUCTED DX E11.9 100 each 10   acetaminophen (TYLENOL) 500 MG tablet Take 500 mg by mouth daily.     albuterol (VENTOLIN HFA) 108 (90 Base) MCG/ACT inhaler Inhale 2 puffs into the lungs every 6 (six) hours as needed for wheezing or shortness of breath. 18 g 1   AMBULATORY NON FORMULARY MEDICATION Medication Name: Allergy Injection- every 3 weeks      aspirin 81 MG tablet Take 81 mg by mouth daily.     azelastine (ASTELIN) 0.1 % nasal spray Use in each nostril as directed 30 mL 5   B-D ULTRA-FINE 33 LANCETS MISC Use to test blood sugar once daily. Dx: E11.9 100 each 6   Blood Glucose Monitoring Suppl (PRODIGY AUTOCODE BLOOD GLUCOSE) DEVI by Does not apply route. Daily as directed     budesonide-formoterol (SYMBICORT) 160-4.5 MCG/ACT inhaler 2 puffs twice daily 30.6 each 5   Calcium Carbonate-Vitamin D 600-400 MG-UNIT chew tablet Chew 1 tablet by mouth daily.      EPINEPHrine (EPIPEN 2-PAK) 0.3 mg/0.3 mL IJ SOAJ injection Inject 0.3 mg into the muscle as needed for anaphylaxis. 2 each 1   fluticasone (FLONASE) 50 MCG/ACT nasal spray Place 1-2 sprays into both nostrils daily. 16 mL 2   levocetirizine (XYZAL) 5 MG tablet Take 1 tablet (5 mg total) by mouth every evening. 90 tablet 2   lisinopril (ZESTRIL) 10 MG tablet TAKE 1 TABLET BY MOUTH EVERY DAY 30 tablet 1   nabumetone (RELAFEN) 500 MG tablet Take 500 mg by mouth 2 (two) times daily as needed.     omeprazole (PRILOSEC) 40 MG capsule TAKE 1 CAPSULE BY MOUTH EVERY DAY 90 capsule 0   Pediatric Multivitamins-Fl (MULTIPLE VITAMINS/FLUORIDE) 1 MG CHEW      simvastatin (ZOCOR) 20 MG tablet Take one tablet by mouth once daily at bedtime for cholesterol 90 tablet 2   triamterene-hydrochlorothiazide (MAXZIDE) 75-50 MG tablet TAKE ONE TABLET BY MOUTH ONCE DAILY FOR BLOOD PRESSURE 90 tablet 1   vitamin C (ASCORBIC ACID) 500 MG tablet Take 500 mg by mouth every other day.     vitamin E 180 MG (400 UNITS) capsule Take 400 Units by mouth every other day.      No current facility-administered medications on file prior to visit.    Allergies  Allergen Reactions   Elemental Sulfur Diarrhea   Oxycodone     Had stomach and headache as side effect from medicine    Social History   Socioeconomic History   Marital status: Divorced    Spouse name: Not on file   Number of children: Not on file   Years  of education: Not on file   Highest education level: Not on file  Occupational History   Not on file  Tobacco Use   Smoking status: Never   Smokeless tobacco: Never  Vaping Use   Vaping Use: Never used  Substance and Sexual Activity   Alcohol use: Yes    Comment: Seldom- Wine    Drug use: No   Sexual activity: Never    Comment: post office worker  Other Topics Concern   Not on file  Social History Narrative   Not on file   Social Determinants of Health   Financial Resource Strain: Not on file  Food Insecurity: Not on file  Transportation Needs: Not on file  Physical Activity: Not on file  Stress: Not on file  Social Connections: Not on file  Intimate  Partner Violence: Not on file    Family History  Problem Relation Age of Onset   Cancer Father    Diabetes Father    Allergic rhinitis Son    Diabetes Brother    Allergic rhinitis Son    Diabetes Paternal Aunt    Lung cancer Cousin    Asthma Neg Hx     Past Surgical History:  Procedure Laterality Date   ABDOMINAL HYSTERECTOMY     1994   carpal tunnel both hands     2012   CLOSED MANIPULATION SHOULDER Left 04/2014   EYE SURGERY Left 08/25/2015   EYE SURGERY Right 09/24/2015   JOINT REPLACEMENT     both knees replacement, 2010   mole removed from face     Nodule removed from back     SHOULDER SURGERY Left 11/2013   TONSILLECTOMY     as teenager    ROS: Review of Systems Negative except as stated above  PHYSICAL EXAM: BP 135/69    Pulse 67    Resp 16    Wt 299 lb (135.6 kg)    SpO2 95%    BMI 45.46 kg/m   Wt Readings from Last 3 Encounters:  05/28/21 299 lb (135.6 kg)  02/19/21 291 lb 4 oz (132.1 kg)  01/25/21 292 lb (132.5 kg)    Physical Exam  General appearance - alert, well appearing, obese older African-American female and in no distress Mental status - normal mood, behavior, speech, dress, motor activity, and thought processes Mouth - mucous membranes moist, pharynx normal without  lesions Neck - supple, no significant adenopathy Chest - clear to auscultation, no wheezes, rales or rhonchi, symmetric air entry Heart - normal rate, regular rhythm, normal S1, S2, no murmurs, rubs, clicks or gallops Extremities - peripheral pulses normal, no pedal edema, no clubbing or cyanosis Depression screen [facility] 2/9 05/28/2021 01/31/2021 01/25/2021  Decreased Interest 3 0 0  Down, Depressed, Hopeless 1 0 1  PHQ - 2 Score 4 0 1  Altered sleeping 2 - -  Tired, decreased energy 2 - -  Change in appetite 1 - -  Feeling bad or failure about yourself  0 - -  Trouble concentrating 1 - -  Moving slowly or fidgety/restless 1 - -  Suicidal thoughts 0 - -  PHQ-9 Score 11 - -  Some recent data might be hidden     CMP Latest Ref Rng & Units 01/25/2021 08/17/2018 07/23/2018  Glucose 65 - 99 mg/dL 92 101(H) 99  BUN 8 - 27 mg/dL 12 9 12   Creatinine 0.57 - 1.00 mg/dL 0.97 0.83 0.70  Sodium 134 - 144 mmol/L 141 142 142  Potassium 3.5 - 5.2 mmol/L 4.4 3.8 3.6  Chloride 96 - 106 mmol/L 100 103 103  CO2 20 - 29 mmol/L 24 31 30   Calcium 8.7 - 10.3 mg/dL 10.2 9.9 9.9  Total Protein 6.0 - 8.5 g/dL 7.5 - 7.1  Total Bilirubin 0.0 - 1.2 mg/dL 0.3 - 0.5  Alkaline Phos 44 - 121 IU/L 68 - -  AST 0 - 40 IU/L 24 - 17  ALT 0 - 32 IU/L 14 - 14   Lipid Panel     Component Value Date/Time   CHOL 165 01/25/2021 1438   TRIG 148 01/25/2021 1438   HDL 51 01/25/2021 1438   CHOLHDL 3.2 01/25/2021 1438   CHOLHDL 3.0 07/23/2018 1045   VLDL 20 11/17/2016 1007   LDLCALC 88 01/25/2021 1438   LDLCALC 86 07/23/2018 1045  CBC    Component Value Date/Time   WBC 8.2 01/25/2021 1438   WBC 9.4 11/13/2017 1025   RBC 4.85 01/25/2021 1438   RBC 4.80 11/13/2017 1025   HGB 13.2 01/25/2021 1438   HCT 39.8 01/25/2021 1438   PLT 344 01/25/2021 1438   MCV 82 01/25/2021 1438   MCH 27.2 01/25/2021 1438   MCH 26.9 (L) 11/13/2017 1025   MCHC 33.2 01/25/2021 1438   MCHC 33.3 11/13/2017 1025   RDW 14.5 01/25/2021 1438    LYMPHSABS 2.3 07/29/2018 1443   MONOABS 576 11/17/2016 1007   EOSABS 0.2 07/29/2018 1443   BASOSABS 0.0 07/29/2018 1443   Lab Results  Component Value Date   HGBA1C 5.9 01/25/2021     ASSESSMENT AND PLAN:  1. Type 2 diabetes mellitus with morbid obesity (HCC) Blood sugars are at goal.  She is in remission.  Encouraged her to continue trying to eat healthy.  She has another follow-up appointment with the dietitian later this month.  Discussed the importance of regular exercise.  Patient plans to start using her treadmill and stationary bike.  Advised to start low and go slow but the ultimate goal is to eventually get up to 150 minutes/week total of moderate intensity exercise.  2. Hypertension associated with diabetes ([place]) Close to goal.  Continue current medications and low-salt diet.  3. Hyperlipidemia associated with type 2 diabetes mellitus ([place]) LDL close to goal.  Continue Zocor.  4. Need for Streptococcus pneumoniae vaccination - PNEUMOCOCCAL CONJUGATE VACCINE 15-VALENT  5.  Pelvic pain syndrome Followed by urology.  On Vaginal Valium and oral Mobic.  Patient to sign a release for [place] to get her colonoscopy report from Dr. Michail Sermon and her last mammogram report from her gynecologist.  Patient was given the opportunity to ask questions.  Patient verbalized understanding of the plan and was able to repeat key elements of the plan.   Orders Placed This Encounter  Procedures   PNEUMOCOCCAL CONJUGATE VACCINE 15-VALENT     Requested Prescriptions    No prescriptions requested or ordered in this encounter    Return in about 4 months (around 09/25/2021) for Sign release to get colonoscopy report - Dr. Michail Sermon and Mammogram report from her GYN.  Karle Plumber, MD, FACP

## 2021-05-28 NOTE — Telephone Encounter (Signed)
Phone call placed to patient this afternoon.  I left a message on her voicemail informing that I was calling about her medication on her med list and that I will have my medical assistant reach out to her.

## 2021-05-29 NOTE — Telephone Encounter (Signed)
Pt is active on MyChart sent a MyChart message

## 2021-06-06 DIAGNOSIS — M62838 Other muscle spasm: Secondary | ICD-10-CM | POA: Diagnosis not present

## 2021-06-06 DIAGNOSIS — N3941 Urge incontinence: Secondary | ICD-10-CM | POA: Diagnosis not present

## 2021-06-06 DIAGNOSIS — M6281 Muscle weakness (generalized): Secondary | ICD-10-CM | POA: Diagnosis not present

## 2021-06-06 DIAGNOSIS — R102 Pelvic and perineal pain: Secondary | ICD-10-CM | POA: Diagnosis not present

## 2021-06-06 DIAGNOSIS — M6289 Other specified disorders of muscle: Secondary | ICD-10-CM | POA: Diagnosis not present

## 2021-06-06 DIAGNOSIS — R3914 Feeling of incomplete bladder emptying: Secondary | ICD-10-CM | POA: Diagnosis not present

## 2021-06-12 ENCOUNTER — Ambulatory Visit (INDEPENDENT_AMBULATORY_CARE_PROVIDER_SITE_OTHER): Payer: Medicare Other

## 2021-06-12 ENCOUNTER — Encounter: Payer: Medicare Other | Attending: Internal Medicine | Admitting: Dietician

## 2021-06-12 ENCOUNTER — Encounter: Payer: Self-pay | Admitting: Dietician

## 2021-06-12 ENCOUNTER — Other Ambulatory Visit: Payer: Self-pay

## 2021-06-12 DIAGNOSIS — J309 Allergic rhinitis, unspecified: Secondary | ICD-10-CM

## 2021-06-12 DIAGNOSIS — E119 Type 2 diabetes mellitus without complications: Secondary | ICD-10-CM | POA: Diagnosis not present

## 2021-06-12 NOTE — Patient Instructions (Addendum)
Try Fairlife brand low fat milk, and look for their chocolate milk for a sweet treat.  Look for whole wheat egg noodles at the grocery store to increase your whole grains.  If you miss your dinner, have a Glucerna shake in it's place.  When sitting down for lunch or dinner, set a 2 hour timer on your phone to remind you to check your blood sugar after that meal. We want it to always be under 180 mg/dL

## 2021-06-12 NOTE — Progress Notes (Signed)
Diabetes Self-Management Education  Visit Type: Follow-up  Appt. Start Time: 1400 Appt. End Time: 1430  06/12/2021  Cheryl [name], identified by name and date of birth, is a 66 y.o. female with a diagnosis of Diabetes:  .   ASSESSMENT Pt reports slightly elevated blood pressure at doctor visit earlier in the day, usually in range at home.   Pt has started new medications for anxiety, no side effects when taken PRN. Pt reports decrease in lower back pain, has assisted in them getting more done ay home, has gotten as many as 10,000 steps at home.  Pt brought their glucometer to the visit. Pt is checking their BG fasting, and before lunch. 90 day BG average is 113 mg/dL.  Pt reports eating more healthy over the holidays than before. Pt has bought hummus and raw broccoli, cauliflower, and carrots to dip in it. Pt has been eating fat-free greek yogurt with pecans for breakfast, and glucerna for a snack, and tried Lactaid with no GI symptoms. Pt is eating more cobb salads. Pt reports occasionally skipping dinner, or eating later if they get busy.  There were no vitals taken for this visit. There is no height or weight on file to calculate BMI.   Diabetes Self-Management Education - 06/12/21 1400       Visit Information   Visit Type Follow-up      Complications   How often do you check your blood sugar? 1-2 times/day    Fasting Blood glucose range (mg/dL) 70-129    Postprandial Blood glucose range (mg/dL) 130-179;70-129      Dietary Intake   Breakfast Sauasage biscuit from [employer], apple, coffee w/ cream and sugar    Lunch Homemade vegetable soup, [place] and cheese sandwich on whole wheat    Dinner Chicken wings, potato salad    Beverage(s) coffee, water      Exercise   Exercise Type ADL's;Light (walking / raking leaves)      Individualized Goals (developed by patient)   Nutrition General guidelines for healthy choices and portions discussed;Follow meal plan discussed    Physical  Activity Exercise 1-2 times per week    Monitoring  test my blood glucose as discussed;send in my blood glucose log as discussed;test blood glucose pre and post meals as discussed   Bring meter to follow-up     Patient Self-Evaluation of Goals - Patient rates self as meeting previously set goals (% of time)   Nutrition 50 - 75 %    Physical Activity 25 - 50%    Medications Not Applicable    Monitoring >75%    Problem Solving 50 - 75 %    Reducing Risk 50 - 75 %    Health Coping 50 - 75 %      Post-Education Assessment   Patient understands the diabetes disease and treatment process. Needs Review    Patient understands incorporating nutritional management into lifestyle. Needs Review    Patient undertands incorporating physical activity into lifestyle. Needs Review    Patient understands monitoring blood glucose, interpreting and using results Demonstrates understanding / competency    Patient understands prevention, detection, and treatment of acute complications. Needs Review    Patient understands prevention, detection, and treatment of chronic complications. Needs Review    Patient understands how to develop strategies to address psychosocial issues. Needs Review    Patient understands how to develop strategies to promote health/change behavior. Demonstrates understanding / competency      Outcomes   Expected  Outcomes Demonstrated interest in learning. Expect positive outcomes    Future DMSE 3-4 months    Program Status Not Completed      Subsequent Visit   Since your last visit have you had your blood pressure checked? Yes    Is your most recent blood pressure lower, unchanged, or higher since your last visit? Higher    Since your last visit have you experienced any weight changes? No change    Since your last visit, are you checking your blood glucose at least once a day? Yes             Individualized Plan for Diabetes Self-Management Training:   Learning Objective:   Patient will have a greater understanding of diabetes self-management. Patient education plan is to attend individual and/or group sessions per assessed needs and concerns.   Plan:   Patient Instructions  Try Fairlife brand low fat milk, and look for their chocolate milk for a sweet treat.  Look for whole wheat egg noodles at the grocery store to increase your whole grains.  If you miss your dinner, have a Glucerna shake in it's place.  When sitting down for lunch or dinner, set a 2 hour timer on your phone to remind you to check your blood sugar after that meal. We want it to always be under 180 mg/dL  Expected Outcomes:  Demonstrated interest in learning. Expect positive outcomes  Education material provided:   If problems or questions, patient to contact team via:  Phone and Email  Future DSME appointment: 3-4 months

## 2021-06-26 ENCOUNTER — Other Ambulatory Visit: Payer: Self-pay | Admitting: Allergy & Immunology

## 2021-06-26 ENCOUNTER — Other Ambulatory Visit: Payer: Self-pay | Admitting: Internal Medicine

## 2021-06-27 DIAGNOSIS — M62838 Other muscle spasm: Secondary | ICD-10-CM | POA: Diagnosis not present

## 2021-06-27 DIAGNOSIS — M6289 Other specified disorders of muscle: Secondary | ICD-10-CM | POA: Diagnosis not present

## 2021-06-27 DIAGNOSIS — M6281 Muscle weakness (generalized): Secondary | ICD-10-CM | POA: Diagnosis not present

## 2021-06-27 DIAGNOSIS — R102 Pelvic and perineal pain: Secondary | ICD-10-CM | POA: Diagnosis not present

## 2021-06-27 DIAGNOSIS — N3941 Urge incontinence: Secondary | ICD-10-CM | POA: Diagnosis not present

## 2021-06-27 NOTE — Telephone Encounter (Signed)
Requested medication (s) are due for refill today: has refill remaining  Requested medication (s) are on the active medication list: yes  Last refill:  05/21/21 21ml with 2 RF  Future visit scheduled: 09/27/21  Notes to clinic:  This medication can not be delegated, please assess.   Requested Prescriptions  Pending Prescriptions Disp Refills   fluticasone (FLONASE) 50 MCG/ACT nasal spray [Pharmacy Med Name: FLUTICASONE PROP 50 MCG SPRAY] 48 mL     Sig: Place 1-2 sprays into both nostrils daily.     Not Delegated - Ear, Nose, and Throat: Nasal Preparations - Corticosteroids Failed - 06/26/2021  6:14 PM      Failed - This refill cannot be delegated      Passed - Valid encounter within last 12 months    Recent Outpatient Visits           1 month ago Type 2 diabetes mellitus with morbid obesity [facility])   [place], MD   5 months ago Encounter to establish care   [place], MD       Future Appointments             In 1 month Ernst Bowler Gwenith Daily, MD [facility] [place]   In 3 months Ladell Pier, MD [place]

## 2021-07-02 ENCOUNTER — Other Ambulatory Visit: Payer: Self-pay | Admitting: Internal Medicine

## 2021-07-02 ENCOUNTER — Ambulatory Visit (INDEPENDENT_AMBULATORY_CARE_PROVIDER_SITE_OTHER): Payer: Medicare Other | Admitting: *Deleted

## 2021-07-02 DIAGNOSIS — J309 Allergic rhinitis, unspecified: Secondary | ICD-10-CM | POA: Diagnosis not present

## 2021-07-03 NOTE — Telephone Encounter (Signed)
Patient wanted to inform PCP she only has 2 pills left and would like script sent to [facility], informed patient please allow 48 to 72 hour turn around time

## 2021-07-03 NOTE — Telephone Encounter (Signed)
Requested medication (s) are due for refill today: yes  Requested medication (s) are on the active medication list: yes  Last refill:  11/22/20 #90 with 1 RF  Future visit scheduled: 10/07/21  Notes to clinic:  Prescriber not in this practice, please assess.   Requested Prescriptions  Pending Prescriptions Disp Refills   triamterene-hydrochlorothiazide (MAXZIDE) 75-50 MG tablet [Pharmacy Med Name: TRIAMTERENE-HCTZ 75-50 MG TAB] 90 tablet 3    Sig: TAKE 1 TABLET BY MOUTH EVERY DAY     Cardiovascular: Diuretic Combos Passed - 07/02/2021  5:35 PM      Passed - K in normal range and within 180 days    Potassium  Date Value Ref Range Status  01/25/2021 4.4 3.5 - 5.2 mmol/L Final          Passed - Na in normal range and within 180 days    Sodium  Date Value Ref Range Status  01/25/2021 141 134 - 144 mmol/L Final          Passed - Cr in normal range and within 180 days    Creat  Date Value Ref Range Status  08/17/2018 0.83 0.50 - 0.99 mg/dL Final    Comment:    For patients >52 years of age, the reference limit for Creatinine is approximately 13% higher for people identified as African-American. .    Creatinine, Ser  Date Value Ref Range Status  01/25/2021 0.97 0.57 - 1.00 mg/dL Final   Creatinine, Urine  Date Value Ref Range Status  07/27/2018 54 20 - 275 mg/dL Final          Passed - Last BP in normal range    BP Readings from Last 1 Encounters:  05/28/21 135/69          Passed - Valid encounter within last 6 months    Recent Outpatient Visits           1 month ago Type 2 diabetes mellitus with morbid obesity [facility])   [place], MD   5 months ago Encounter to establish care   [place], MD       Future Appointments             In 1 month Ernst Bowler Gwenith Daily, MD [facility] and [place]   In 2 months Ladell Pier, MD [place]

## 2021-07-05 DIAGNOSIS — Z20822 Contact with and (suspected) exposure to covid-19: Secondary | ICD-10-CM | POA: Diagnosis not present

## 2021-07-10 ENCOUNTER — Ambulatory Visit (INDEPENDENT_AMBULATORY_CARE_PROVIDER_SITE_OTHER): Payer: Medicare Other

## 2021-07-10 DIAGNOSIS — J309 Allergic rhinitis, unspecified: Secondary | ICD-10-CM

## 2021-07-11 DIAGNOSIS — E1151 Type 2 diabetes mellitus with diabetic peripheral angiopathy without gangrene: Secondary | ICD-10-CM | POA: Diagnosis not present

## 2021-07-11 DIAGNOSIS — L84 Corns and callosities: Secondary | ICD-10-CM | POA: Diagnosis not present

## 2021-07-11 DIAGNOSIS — L603 Nail dystrophy: Secondary | ICD-10-CM | POA: Diagnosis not present

## 2021-07-11 DIAGNOSIS — I739 Peripheral vascular disease, unspecified: Secondary | ICD-10-CM | POA: Diagnosis not present

## 2021-07-16 ENCOUNTER — Ambulatory Visit (INDEPENDENT_AMBULATORY_CARE_PROVIDER_SITE_OTHER): Payer: Medicare Other

## 2021-07-16 DIAGNOSIS — R3982 Chronic bladder pain: Secondary | ICD-10-CM | POA: Diagnosis not present

## 2021-07-16 DIAGNOSIS — M62838 Other muscle spasm: Secondary | ICD-10-CM | POA: Diagnosis not present

## 2021-07-16 DIAGNOSIS — J309 Allergic rhinitis, unspecified: Secondary | ICD-10-CM | POA: Diagnosis not present

## 2021-07-16 DIAGNOSIS — M6281 Muscle weakness (generalized): Secondary | ICD-10-CM | POA: Diagnosis not present

## 2021-07-16 DIAGNOSIS — R102 Pelvic and perineal pain: Secondary | ICD-10-CM | POA: Diagnosis not present

## 2021-07-16 DIAGNOSIS — M6289 Other specified disorders of muscle: Secondary | ICD-10-CM | POA: Diagnosis not present

## 2021-07-16 DIAGNOSIS — N3941 Urge incontinence: Secondary | ICD-10-CM | POA: Diagnosis not present

## 2021-07-16 DIAGNOSIS — R3914 Feeling of incomplete bladder emptying: Secondary | ICD-10-CM | POA: Diagnosis not present

## 2021-07-23 ENCOUNTER — Ambulatory Visit (INDEPENDENT_AMBULATORY_CARE_PROVIDER_SITE_OTHER): Payer: Medicare Other

## 2021-07-23 DIAGNOSIS — J309 Allergic rhinitis, unspecified: Secondary | ICD-10-CM | POA: Diagnosis not present

## 2021-07-30 ENCOUNTER — Ambulatory Visit (INDEPENDENT_AMBULATORY_CARE_PROVIDER_SITE_OTHER): Payer: Medicare Other | Admitting: *Deleted

## 2021-07-30 DIAGNOSIS — J309 Allergic rhinitis, unspecified: Secondary | ICD-10-CM

## 2021-08-20 ENCOUNTER — Other Ambulatory Visit: Payer: Self-pay

## 2021-08-20 ENCOUNTER — Encounter: Payer: Self-pay | Admitting: Allergy & Immunology

## 2021-08-20 ENCOUNTER — Ambulatory Visit (INDEPENDENT_AMBULATORY_CARE_PROVIDER_SITE_OTHER): Payer: Medicare Other | Admitting: Allergy & Immunology

## 2021-08-20 ENCOUNTER — Ambulatory Visit: Payer: Self-pay

## 2021-08-20 VITALS — BP 118/60 | HR 66 | Temp 97.4°F | Resp 16 | Ht 68.0 in | Wt 295.8 lb

## 2021-08-20 DIAGNOSIS — J309 Allergic rhinitis, unspecified: Secondary | ICD-10-CM

## 2021-08-20 DIAGNOSIS — J4540 Moderate persistent asthma, uncomplicated: Secondary | ICD-10-CM

## 2021-08-20 DIAGNOSIS — J3089 Other allergic rhinitis: Secondary | ICD-10-CM

## 2021-08-20 NOTE — Progress Notes (Signed)
? ?FOLLOW UP ? ?Date of Service/Encounter:  08/20/21 ? ? ?Assessment:  ? ?Moderate persistent asthma - did not do spirometry today per patient request ?  ?Perennial allergic rhinitis (dust mites, cockroach, ragweed) - on allergen immunotherapy with maintenance reached August 2020 ?  ?Complex medical history ?  ?Fully vaccinated for Moderna, including the latest bivalent booster ?  ? ?Plan/Recommendations:  ? ?1. Moderate persistent asthma, uncomplicated ?- Lung function looks excellent today. ?- We are not going to make any changes today. ?- Daily controller medication(s): Symbicort 160/4.42mg two puffs twice daily with spacer ?- Prior to physical activity: albuterol 2 puffs 10-15 minutes before physical activity. ?- Rescue medications: albuterol 4 puffs every 4-6 hours as needed ?- Asthma control goals:  ?* Full participation in all desired activities (may need albuterol before activity) ?* Albuterol use two time or less a week on average (not counting use with activity) ?* Cough interfering with sleep two time or less a month ?* Oral steroids no more than once a year ?* No hospitalizations ? ?2. Perennial allergic rhinitis (dust mites, cockroach) ?- Continue with allergy shots at the same schedule (every two weeks).  ?- Continue with cetirizine 10 mg daily. ?- Continue with Flonase 1 to 2 sprays per nostril ONCE DAILY.  ?- Continue with Astelin 1 to 2 sprays per nostril ONCE DAILY.  ? ?3. Return in about 6 months (around 02/20/2022).  ? ?Subjective:  ? ?Cheryl CYOUNG BRIMis a 66y.o. female presenting today for follow up of  ?Chief Complaint  ?Patient presents with  ? Asthma  ?  Ok.   ? Follow-up  ? Allergic Rhinitis   ?  Nose stopped up from time to time and her nose was bleeding a little bit this morning.  ? ? ?Cheryl [name] a history of the following: ?Patient Active Problem List  ? Diagnosis Date Noted  ? Hyperlipidemia associated with type 2 diabetes mellitus ([facility] 01/25/2021  ? Type 2 diabetes mellitus with  morbid obesity ([place] 01/25/2021  ? Moderate persistent asthma, uncomplicated 016/02/9603 ? OSA (obstructive sleep apnea) 12/02/2016  ? Hyperlipidemia LDL goal <100 10/21/2014  ? Essential hypertension, benign 10/21/2014  ? DM w/o complication type II ([facility] [place] 09/13/2014  ? Routine general medical examination at a health care facility 10/12/2013  ? 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 Need for prophylactic vaccination and inoculation against influenza 02/08/2013  ? GERD (gastroesophageal reflux disease) 02/08/2013  ? Other and unspecified hyperlipidemia 11/10/2012  ? Type II or unspecified type diabetes mellitus without mention of complication, not stated as uncontrolled 10/25/2012  ? Impaired fasting glucose 09/07/2012  ? Morbid obesity ([facility] 09/07/2012  ? Generalized osteoarthrosis, involving multiple sites 09/07/2012  ? Carpal tunnel syndrome 09/07/2012  ? Other, multiple, and unspecified sites, insect bite, nonvenomous, without mention of infection(919.4) 09/07/2012  ? Special screening for malignant neoplasms, colon 09/07/2012  ? Other specified cardiac dysrhythmias(427.89) 09/07/2012  ? Candidiasis of vulva and vagina 09/07/2012  ? Abdominal pain, generalized 09/07/2012  ? Symptomatic menopausal or female climacteric states 09/07/2012  ? Hypertension associated with diabetes ([facility] 09/07/2012  ? Perennial allergic rhinitis 09/07/2012  ? Pain in joint, site unspecified 09/07/2012  ? Other malaise and fatigue 09/07/2012  ? ? ?History obtained from: chart review and patient. ? ?Cheryl [name] is a 66y.o. female presenting for a follow up visit. She was last seen in September 2022. At that time, lung function looked awesome. We continued with Symbicort 16101m  two puffs BID as well as albuterol as needed.  For allergic rhinitis, would continue with allergy shots at the same schedule as well as cetirizine, Flonase, and Astelin. ? ?Since last visit, she has done very well.  ? ?Asthma/Respiratory Symptom  History: She remains on the Symbicort two puffs twice daily. These has needed her albuterol inhaler and she did well with that. She just got back from [place].  She has not needed prednisone in quite some time. She reports that her Symbicort is affordable right now. They are now getting ready to mess with their insurance.  ? ?Allergic Rhinitis Symptom History: She is doing well from an allergy perspective. She has been more fatigued lately with the increase in the pollen.  She has mostly done very well from an allergy perspective.  ? ?Cheryl [name] is on allergen immunotherapy. She receives one injection. Immunotherapy script #1 contains ragweed, dust mites and cockroach. She currently receives 0.65m of the RED vial (1/100). She started shots March of 2020 and reached maintenance in August of 2020. She has had no large local reactions. She is getting shots every 3 weeks. She is doing very well with her allergy shots and not having any large local reactions.  ? ?She has a 154yoand a 2yo grandchildren. [place] ? ?Otherwise, there have been no changes to her past medical history, surgical history, family history, or social history. ? ? ? ?Review of Systems  ?Constitutional: Negative.  Negative for chills, fever, malaise/fatigue and weight loss.  ?HENT: Negative.  Negative for congestion, ear discharge and ear pain.   ?Eyes:  Negative for pain, discharge and redness.  ?Respiratory:  Negative for cough, sputum production, shortness of breath and wheezing.   ?Cardiovascular: Negative.  Negative for chest pain and palpitations.  ?Gastrointestinal:  Negative for abdominal pain, constipation, diarrhea, heartburn, nausea and vomiting.  ?Skin: Negative.  Negative for itching and rash.  ?Neurological:  Negative for dizziness and headaches.  ?Endo/Heme/Allergies:  Negative for environmental allergies. Does not bruise/bleed easily.   ? ? ? ?Objective:  ? ?Blood pressure  118/60, pulse 66, temperature (!) 97.4 ?F (36.3 ?C), temperature source Temporal, resp. rate 16, height '5\' 8"'$  (1.727 m), weight 295 lb 12.8 oz (134.2 kg), SpO2 98 %. ?Body mass index is 44.98 kg/m?. ? ? ? ?Physical Exam ?Vitals reviewed.  ?Constitutional:   ?   Appearance: She is well-developed.  ?HENT:  ?   Head: Normocephalic and atraumatic.  ?   Right Ear: Tympanic membrane, ear canal and external ear normal. No drainage, swelling or tenderness. Tympanic membrane is not injected, scarred, erythematous, retracted or bulging.  ?   Left Ear: Tympanic membrane, ear canal and external ear normal. No drainage, swelling or tenderness. Tympanic membrane is not injected, scarred, erythematous, retracted or bulging.  ?   Nose: No nasal deformity, septal deviation, mucosal edema or rhinorrhea.  ?   Right Turbinates: Enlarged.  ?   Left Turbinates: Enlarged.  ?   Right Sinus: No maxillary sinus tenderness or frontal sinus tenderness.  ?   Left Sinus: No maxillary sinus tenderness or frontal sinus tenderness.  ?   Mouth/Throat:  ?   Mouth: Mucous membranes are not pale and not dry.  ?   Pharynx: Uvula midline.  ?Eyes:  ?   General:     ?   Right eye: No discharge.     ?   Left eye: No discharge.  ?  Conjunctiva/sclera: Conjunctivae normal.  ?   Right eye: Right conjunctiva is not injected. No chemosis. ?   Left eye: Left conjunctiva is not injected. No chemosis. ?   Pupils: Pupils are equal, round, and reactive to light.  ?Cardiovascular:  ?   Rate and Rhythm: Normal rate and regular rhythm.  ?   Heart sounds: Normal heart sounds.  ?Pulmonary:  ?   Effort: Pulmonary effort is normal. No tachypnea, accessory muscle usage or respiratory distress.  ?   Breath sounds: Normal breath sounds. No stridor, decreased air movement or transmitted upper airway sounds. No wheezing, rhonchi or rales.  ?Chest:  ?   Chest wall: No tenderness.  ?Abdominal:  ?   Tenderness: There is no abdominal tenderness. There is no guarding or rebound.   ?Lymphadenopathy:  ?   Head:  ?   Right side of head: No submandibular, tonsillar or occipital adenopathy.  ?   Left side of head: No submandibular, tonsillar or occipital adenopathy.  ?   Cervical: No cervical ade

## 2021-08-20 NOTE — Patient Instructions (Addendum)
1. Moderate persistent asthma, uncomplicated ?- Lung function looks excellent today. ?- We are not going to make any changes today. ?- Daily controller medication(s): Symbicort 160/4.68mg two puffs twice daily with spacer ?- Prior to physical activity: albuterol 2 puffs 10-15 minutes before physical activity. ?- Rescue medications: albuterol 4 puffs every 4-6 hours as needed ?- Asthma control goals:  ?* Full participation in all desired activities (may need albuterol before activity) ?* Albuterol use two time or less a week on average (not counting use with activity) ?* Cough interfering with sleep two time or less a month ?* Oral steroids no more than once a year ?* No hospitalizations ? ?2. Perennial allergic rhinitis (dust mites, cockroach) ?- Continue with allergy shots at the same schedule (every two weeks).  ?- Continue with cetirizine 10 mg daily. ?- Continue with Flonase 1 to 2 sprays per nostril ONCE DAILY.  ?- Continue with Astelin 1 to 2 sprays per nostril ONCE DAILY.  ? ?3. Return in about 6 months (around 02/20/2022).  ? ? ?Please inform [place]of any Emergency Department visits, hospitalizations, or changes in symptoms. Call u[place]abefore going to the ED for breathing or allergy symptoms since we might be able to fit you in for a sick visit. Feel free to contact u[place]anytime with any questions, problems, or concerns. ? ?It was a pleasure to see you again today! ? ?Websites that have reliable patient information: ?1. American Academy of Asthma, Allergy, and Immunology: www.aaaai.org ?2. Food Allergy Research and Education (FARE): foodallergy.org ?3. Mothers of Asthmatics: http://www.asthmacommunitynetwork.org ?4. [employer] Corporationof Allergy, Asthma, and Immunology: wMonthlyElectricBill.co.uk? ? ?COVID-19 Vaccine Information can be found at: [email or web address] questions related to vaccine distribution or appointments, please email [email or web address] or call  [phone]  ? ?We realize that you might be concerned about having an allergic reaction to the COVID19 vaccines. To help with that concern, WE ARE OFFERING THE COVID19 VACCINES IN OUR OFFICE! Ask the front desk for dates!  ? ? ? ??Like? u[place]on Facebook and Instagram for our latest updates!  ?  ? ? ?A healthy democracy works best when ANew York Life Insuranceparticipate! Make sure you are registered to vote! If you have moved or changed any of your contact information, you will need to get this updated before voting! ? ?In some cases, you MAY be able to register to vote online: [email or web address]? ? ? ? ?

## 2021-08-28 ENCOUNTER — Telehealth: Payer: Self-pay | Admitting: Allergy & Immunology

## 2021-08-28 ENCOUNTER — Ambulatory Visit: Payer: Self-pay | Admitting: *Deleted

## 2021-08-28 NOTE — Telephone Encounter (Signed)
Pt states she called allergist office and is awaiting a return call.  ? ?She was given number to [facility] for appt tomorrow if they she did not hear back from them.  ? ?

## 2021-08-28 NOTE — Telephone Encounter (Signed)
Patient called to the office wanted advise from Dr Ernst Bowler regarding a rash she developed over the weekend. ? ?The rash is on the right side of her face only. [place] and very itchy. Patient is not sure what may have caused the rash. Patient denies any symptoms of SOB and no issues with throat. ? ?Please advise. ?

## 2021-08-28 NOTE — Telephone Encounter (Signed)
Reason for Disposition ? Patient sounds very sick or weak to the triager ?   Calling her allergist   Rash on right side of face and neck with tingling of mouth ? ?Answer Assessment - Initial Assessment Questions ?1. APPEARANCE of RASH: "Describe the rash."  ?    Has a rash on right side of face and neck.   I have a lot of allergies.     ?2. LOCATION: "Where is the rash located?"  ?    Right side of face and neck   Itches real bad   No pain.  ?I have tingling inside mouth and around my gums.   I have sleep apnea and I wear the mask for that.   I was sore this morning. ?No new drugs or cosmetics.    Denies tongue or throat swelling.   No shortness of breath.     ?3. NUMBER: "How many spots are there?"  ?    *No Answer* ?4. SIZE: "How big are the spots?" (Inches, centimeters or compare to size of a coin)  ?    Like red whelps bumps that itch really bad. ?5. ONSET: "When did the rash start?"  ?    Over the weekend ?6. ITCHING: "Does the rash itch?" If Yes, ask: "How bad is the itch?"  (Scale 0-10; or none, mild, moderate, severe) ?    Yes really bad ?7. PAIN: "Does the rash hurt?" If Yes, ask: "How bad is the pain?"  (Scale 0-10; or none, mild, moderate, severe) ?   - NONE (0): no pain ?   - MILD (1-3): doesn't interfere with normal activities  ?   - MODERATE (4-7): interferes with normal activities or awakens from sleep  ?   - SEVERE (8-10): excruciating pain, unable to do any normal activities ?    No pain ?I was wondering if Benadryl is compatible with my medications. ?8. OTHER SYMPTOMS: "Do you have any other symptoms?" (e.g., fever) ?    The spots are as big as my little finger 1/2 way down and feel like they have fluid or something in them. ?9. PREGNANCY: "Is there any chance you are pregnant?" "When was your last menstrual period?" ?    Not asked ? ?Protocols used: Rash or Redness - Localized-A-AH ? ?

## 2021-08-28 NOTE — Telephone Encounter (Signed)
?  Chief Complaint: Itchy rash on right side of face and neck with tingling sensation around her gums.   Denies shortness of breath or chest tightness, throat not feeling like it's swollen, no swelling inside mouth. ?Symptoms: Rash is very itchy ?Frequency: Over the weekend ?Pertinent Negatives: Patient denies chest tightness or shortness of breath or exposure to anything new or new medications.    Does have a lot of allergies ?Disposition: '[]'$ ED /'[x]'$ Urgent Care (no appt availability in office) / '[]'$ Appointment(In office/virtual)/ '[]'$  [facility]/ '[]'$ Home Care/ '[]'$ Refused Recommended Disposition /'[]'$ [facility]/ '[]'$  Follow-up with PCP ?Additional Notes: She has not taken anything for the rash or itching when asked.   She wants to know what is compatible with all her medications.    Can she take Benadryl?   "That's what I usually do".   She wants to call her allergist first if he can't see her I instructed her to to go the urgent care of ED which she was agreeable to doing.   I went over s/s she needed to go to the ED for:  Wheezing, shortness of breath, chest tightness, swelling in side mouth or throat or the tingling sensation got worse/spread.    ? ?I instructed her to check with her pharmacist to see if her medications are compatible with Benadryl.  She called them and they told her the pharmacist was busy.   She's going to call back after calling her allergist and see if she can speak with a pharmacist at that point. ?

## 2021-08-29 ENCOUNTER — Ambulatory Visit (INDEPENDENT_AMBULATORY_CARE_PROVIDER_SITE_OTHER): Payer: Medicare Other | Admitting: Physician Assistant

## 2021-08-29 ENCOUNTER — Encounter: Payer: Self-pay | Admitting: Physician Assistant

## 2021-08-29 VITALS — BP 124/79 | HR 65 | Temp 98.9°F | Resp 18 | Ht 69.0 in | Wt 295.0 lb

## 2021-08-29 DIAGNOSIS — L309 Dermatitis, unspecified: Secondary | ICD-10-CM | POA: Diagnosis not present

## 2021-08-29 MED ORDER — TRIAMCINOLONE ACETONIDE 0.1 % EX CREA: 1.0000 "application " | TOPICAL_CREAM | Freq: Two times a day (BID) | CUTANEOUS | 0 refills | Status: DC

## 2021-08-29 NOTE — Patient Instructions (Signed)
You are going to use Kenalog cream to help with the rash.  You can use Benadryl as needed if the cream does not help resolve the itching. ? ?Please let us know if there is any else we can do for you we hope that you feel better soon. ? ?Cheryl Rad, PA-C ?Physician Assistant ?[facility] ?[email or web address] ? ?Rash, Adult ?A rash is a change in the color of your skin. A rash can also change the way your skin feels. There are many different conditions and factors that can cause a rash. Some rashes may disappear after a few days, but some may last for a few weeks. Common causes of rashes include: ?Viral infections, such as: ?Colds. ?Measles. ?Hand, foot, and mouth disease. ?Bacterial infections, such as: ?Scarlet fever. ?Impetigo. ?Fungal infections, such as Candida. ?Allergic reactions to food, medicines, or skin care products. ?Follow these instructions at home: ?The goal of treatment is to stop the itching and keep the rash from spreading. Pay attention to any changes in your symptoms. Follow these instructions to help with your condition: ?Medicine ?Take or apply over-the-counter and prescription medicines only as told by your health care provider. These may include: ?Corticosteroid creams to treat red or swollen skin. ?Anti-itch lotions. ?Oral allergy medicines (antihistamines). ?Oral corticosteroids for severe symptoms. ? ?Skin care ?Apply cool compresses to the affected areas. ?Do not scratch or rub your skin. ?Avoid covering the rash. Make sure the rash is exposed to air as much as possible. ?Managing itching and discomfort ?Avoid hot showers or baths, which can make itching worse. A cold shower may help. ?Try taking a bath with: ?Epsom salts. Follow manufacturer instructions on the packaging. You can get these at your local pharmacy or grocery store. ?Baking soda. Pour a small amount into the bath as told by your health care provider. ?Colloidal  oatmeal. Follow manufacturer instructions on the packaging. You can get this at your local pharmacy or grocery store. ?Try applying baking soda paste to your skin. Stir water into baking soda until it reaches a paste-like consistency. ?Try applying calamine lotion. This is an over-the-counter lotion that helps to relieve itchiness. ?Keep cool and out of the sun. Sweating and being hot can make itching worse. ?General instructions ? ?Rest as needed. ?Drink enough fluid to keep your urine pale yellow. ?Wear loose-fitting clothing. ?Avoid scented soaps, detergents, and perfumes. Use gentle soaps, detergents, perfumes, and other cosmetic products. ?Avoid any substance that causes your rash. Keep a journal to help track what causes your rash. Write down: ?What you eat. ?What cosmetic products you use. ?What you drink. ?What you wear. This includes jewelry. ?Keep all follow-up visits as told by your health care provider. This is important. ?Contact a health care provider if: ?You sweat at night. ?You lose weight. ?You urinate more than normal. ?You urinate less than normal, or you notice that your urine is a darker color than usual. ?You feel weak. ?You vomit. ?Your skin or the whites of your eyes look yellow (jaundice). ?Your skin: ?Tingles. ?Is numb. ?Your rash: ?Does not go away after several days. ?Gets worse. ?You are: ?Unusually thirsty. ?More tired than normal. ?You have: ?New symptoms. ?Pain in your abdomen. ?A fever. ?Diarrhea. ?Get help right away if you: ?Have a fever and your symptoms suddenly get worse. ?Develop confusion. ?Have a severe headache or a stiff neck. ?Have severe joint pains or stiffness. ?Have a seizure. ?Develop a rash that covers all or most of your body. The  rash may or may not be painful. ?Develop blisters that: ?Are on top of the rash. ?Grow larger or grow together. ?Are painful. ?Are inside your nose or mouth. ?Develop a rash that: ?Looks like purple pinprick-sized spots all over your  body. ?Has a "bull's eye" or looks like a target. ?Is not related to sun exposure, is red and painful, and causes your skin to peel. ?Summary ?A rash is a change in the color of your skin. Some rashes disappear after a few days, but some may last for a few weeks. ?The goal of treatment is to stop the itching and keep the rash from spreading. ?Take or apply over-the-counter and prescription medicines only as told by your health care provider. ?Contact a health care provider if you have new or worsening symptoms. ?Keep all follow-up visits as told by your health care provider. This is important. ?This information is not intended to replace advice given to you by your health care provider. Make sure you discuss any questions you have with your health care provider. ?Document Revised: 09/03/2018 Document Reviewed: 12/14/2017 ?Elsevier Patient Education ? [place]. ? ?

## 2021-08-29 NOTE — Progress Notes (Signed)
? ?Established Patient Office Visit ? ?Subjective:  ?Patient ID: Cheryl [name], female    DOB: 10-10-1955  Age: 66 y.o. MRN: [id number] ? ?CC:  ?Chief Complaint  ?Patient presents with  ? Rash  ? ? ?HPI ?Cheryl [name] states that she has been having an itchy rash on the right side of her upper chest with a tingling sensation on her neck.  States this is started 3 to 4 days ago.  States that she does have a substantial amount of allergies, has been compliant to her allergy medication and allergy injections.  States that she has not tried any new medications, lotions, detergents, body washes, fragrances. ? ?States that she has not tried anything for relief. ? ?States that she did speak with her allergist office a few moments before our office visit and was told that it was fine to use Benadryl for the itching.  I ? ? ?Past Medical History:  ?Diagnosis Date  ? Allergic rhinitis, cause unspecified   ? Arthritis   ? Asthma   ? Carpal tunnel syndrome   ? Cataract   ? Diabetes mellitus without complication ([place])   ? diet- controlled  ? GERD (gastroesophageal reflux disease)   ? Glaucoma   ? Hypertension   ? Impaired fasting glucose   ? Morbid obesity (Cheryl [name])   ? OSA (obstructive sleep apnea) 12/02/2016  ? Other malaise and fatigue   ? Other specified cardiac dysrhythmias(427.89)   ? Pain in joint, site unspecified   ? Symptomatic menopausal or female climacteric states   ? ? ?Past Surgical History:  ?Procedure Laterality Date  ? ABDOMINAL HYSTERECTOMY    ? 1994  ? carpal tunnel both hands    ? 2012  ? CLOSED MANIPULATION SHOULDER Left 04/2014  ? EYE SURGERY Left 08/25/2015  ? EYE SURGERY Right 09/24/2015  ? JOINT REPLACEMENT    ? both knees replacement, 2010  ? mole removed from face    ? Nodule removed from back    ? SHOULDER SURGERY Left 11/2013  ? TONSILLECTOMY    ? as teenager  ? ? ?Family History  ?Problem Relation Age of Onset  ? Cancer Father   ? Diabetes Father   ? Allergic rhinitis Son   ? Diabetes Brother   ? Allergic  rhinitis Son   ? Diabetes Paternal Aunt   ? Lung cancer Cousin   ? Asthma Neg Hx   ? ? ?Social History  ? ?Socioeconomic History  ? Marital status: Divorced  ?  Spouse name: Not on file  ? Number of children: Not on file  ? Years of education: Not on file  ? Highest education level: Not on file  ?Occupational History  ? Not on file  ?Tobacco Use  ? Smoking status: Never  ? Smokeless tobacco: Never  ?Vaping Use  ? Vaping Use: Never used  ?Substance and Sexual Activity  ? Alcohol use: Yes  ?  Comment: Seldom- Wine   ? Drug use: No  ? Sexual activity: Not Currently  ?  Comment: post office worker  ?Other Topics Concern  ? Not on file  ?Social History Narrative  ? Not on file  ? ?Social Determinants of Health  ? ?Financial Resource Strain: Not on file  ?Food Insecurity: Not on file  ?Transportation Needs: Not on file  ?Physical Activity: Not on file  ?Stress: Not on file  ?Social Connections: Not on file  ?Intimate Partner Violence: Not on file  ? ? ?Outpatient Medications Prior  to Visit  ?Medication Sig Dispense Refill  ? ACCU-CHEK AVIVA PLUS test strip CHECK BLOOD SUGAR ONCE DAILY AS INSTRUCTED DX E11.9 100 each 10  ? acetaminophen (TYLENOL) 500 MG tablet Take 500 mg by mouth daily.    ? albuterol (VENTOLIN HFA) 108 (90 Base) MCG/ACT inhaler Inhale 2 puffs into the lungs every 6 (six) hours as needed for wheezing or shortness of breath. 18 g 1  ? AMBULATORY NON FORMULARY MEDICATION Medication Name: Allergy Injection- every 3 weeks    ? aspirin 81 MG tablet Take 81 mg by mouth daily.    ? Azelastine HCl 137 MCG/SPRAY SOLN USE IN EACH NOSTRIL AS DIRECTED 90 mL 1  ? B-D ULTRA-FINE 33 LANCETS MISC Use to test blood sugar once daily. Dx: E11.9 100 each 6  ? Blood Glucose Monitoring Suppl (PRODIGY AUTOCODE BLOOD GLUCOSE) DEVI by Does not apply route. Daily as directed    ? budesonide-formoterol (SYMBICORT) 160-4.5 MCG/ACT inhaler 2 puffs twice daily 30.6 each 5  ? Calcium Carbonate-Vitamin D 600-400 MG-UNIT chew tablet Chew  1 tablet by mouth daily.     ? EPINEPHrine 0.3 mg/0.3 mL IJ SOAJ injection     ? fluticasone (FLONASE) 50 MCG/ACT nasal spray PLACE 1-2 SPRAYS INTO BOTH NOSTRILS DAILY. 48 mL 3  ? lisinopril (ZESTRIL) 10 MG tablet TAKE 1 TABLET BY MOUTH EVERY DAY 30 tablet 1  ? meloxicam (MOBIC) 15 MG tablet Take 15 mg by mouth as needed.    ? Multiple Vitamin (MULTIVITAMIN ADULT PO) 1 tablet    ? nabumetone (RELAFEN) 500 MG tablet Take 500 mg by mouth 2 (two) times daily as needed.    ? omeprazole (PRILOSEC) 40 MG capsule TAKE 1 CAPSULE BY MOUTH EVERY DAY 90 capsule 0  ? Respiratory Therapy Supplies (CARETOUCH 2 CPAP HOSE HANGER) MISC     ? simvastatin (ZOCOR) 20 MG tablet Take one tablet by mouth once daily at bedtime for cholesterol 90 tablet 2  ? triamterene-hydrochlorothiazide (MAXZIDE) 75-50 MG tablet TAKE 1 TABLET BY MOUTH EVERY DAY 90 tablet 3  ? VALIUM 10 MG tablet 10 mg at bedtime.    ? vitamin C (ASCORBIC ACID) 500 MG tablet Take 500 mg by mouth every other day.    ? vitamin E 180 MG (400 UNITS) capsule Take 400 Units by mouth every other day.    ? levocetirizine (XYZAL) 5 MG tablet Take 1 tablet (5 mg total) by mouth every evening. 90 tablet 2  ? ?No facility-administered medications prior to visit.  ? ? ?Allergies  ?Allergen Reactions  ? Elemental Sulfur Diarrhea  ? Oxycodone   ?  Had stomach and headache as side effect from medicine  ? ? ?ROS ?Review of Systems  ?Constitutional:  Negative for chills and fever.  ?HENT: Negative.    ?Eyes: Negative.   ?Respiratory:  Negative for shortness of breath.   ?Cardiovascular:  Negative for chest pain.  ?Gastrointestinal: Negative.   ?Endocrine: Negative.   ?Genitourinary: Negative.   ?Musculoskeletal: Negative.   ?Skin:  Positive for rash.  ?Allergic/Immunologic: Positive for environmental allergies.  ?Neurological: Negative.   ?Hematological: Negative.   ?Psychiatric/Behavioral: Negative.    ? ?  ?Objective:  ?  ?Physical Exam ?Vitals and nursing note reviewed.   ?Constitutional:   ?   Appearance: Normal appearance.  ?HENT:  ?   Head: Normocephalic and atraumatic.  ?   Right Ear: External ear normal.  ?   Left Ear: External ear normal.  ?   Nose: Nose  normal.  ?   Mouth/Throat:  ?   Mouth: Mucous membranes are moist.  ?   Pharynx: Oropharynx is clear.  ?Eyes:  ?   Extraocular Movements: Extraocular movements intact.  ?   Conjunctiva/sclera: Conjunctivae normal.  ?   Pupils: Pupils are equal, round, and reactive to light.  ?Cardiovascular:  ?   Rate and Rhythm: Normal rate and regular rhythm.  ?   Pulses: Normal pulses.  ?   Heart sounds: Normal heart sounds.  ?Pulmonary:  ?   Effort: Pulmonary effort is normal.  ?   Breath sounds: Normal breath sounds.  ?Musculoskeletal:     ?   General: Normal range of motion.  ?   Cervical back: Normal range of motion and neck supple.  ?Skin: ?   General: Skin is warm and dry.  ?   Comments: See photo  ?Neurological:  ?   General: No focal deficit present.  ?   Mental Status: She is alert and oriented to person, place, and time.  ?Psychiatric:     ?   Mood and Affect: Mood normal.     ?   Behavior: Behavior normal.     ?   Thought Content: Thought content normal.     ?   Judgment: Judgment normal.  ? ? ? ?BP 124/79 (BP Location: Right Arm, Patient Position: Sitting, Cuff Size: Large)   Pulse 65   Temp 98.9 ?F (37.2 ?C) (Oral)   Resp 18   Ht '5\' 9"'$  (1.753 m)   Wt 295 lb (133.8 kg)   SpO2 97%   BMI 43.56 kg/m?  ?Wt Readings from Last 3 Encounters:  ?08/29/21 295 lb (133.8 kg)  ?08/20/21 295 lb 12.8 oz (134.2 kg)  ?05/28/21 299 lb (135.6 kg)  ? ? ? ?Health Maintenance Due  ?Topic Date Due  ? MAMMOGRAM  11/25/2018  ? COVID-19 Vaccine (3 - Pfizer risk series) 08/23/2019  ? HEMOGLOBIN A1C  07/25/2021  ? TETANUS/TDAP  08/03/2021  ? ? ?There are no preventive care reminders to display for this patient. ? ?Lab Results  ?Component Value Date  ? TSH 3.14 11/13/2017  ? ?Lab Results  ?Component Value Date  ? WBC 8.2 01/25/2021  ? HGB 13.2  01/25/2021  ? HCT 39.8 01/25/2021  ? MCV 82 01/25/2021  ? PLT 344 01/25/2021  ? ?Lab Results  ?Component Value Date  ? NA 141 01/25/2021  ? K 4.4 01/25/2021  ? CO2 24 01/25/2021  ? GLUCOSE 92 01/25/2021  ? BUN 12 01/25/2021  ? CRE

## 2021-08-29 NOTE — Telephone Encounter (Signed)
Called and spoke with the patient, she stated that her primary doctor referred her to see someone and she is in office seeing them right now.  ?

## 2021-08-29 NOTE — Telephone Encounter (Signed)
Please have her take pictures.  Make sure she is taking an antihistamine and she can even double it up and do it twice a day.   ? ?Salvatore Marvel, MD ?[facility] ? ?

## 2021-08-30 DIAGNOSIS — Z20822 Contact with and (suspected) exposure to covid-19: Secondary | ICD-10-CM | POA: Diagnosis not present

## 2021-09-04 ENCOUNTER — Other Ambulatory Visit: Payer: Self-pay | Admitting: Internal Medicine

## 2021-09-04 ENCOUNTER — Other Ambulatory Visit: Payer: Self-pay | Admitting: Allergy & Immunology

## 2021-09-10 ENCOUNTER — Ambulatory Visit (INDEPENDENT_AMBULATORY_CARE_PROVIDER_SITE_OTHER): Payer: Medicare Other

## 2021-09-10 DIAGNOSIS — J309 Allergic rhinitis, unspecified: Secondary | ICD-10-CM | POA: Diagnosis not present

## 2021-09-14 DIAGNOSIS — Z20822 Contact with and (suspected) exposure to covid-19: Secondary | ICD-10-CM | POA: Diagnosis not present

## 2021-09-18 IMAGING — US US EXTREM LOW VENOUS
1 series · 13 of 24 positions shown · non-contrast
Comparison: Lower extremity arterial examination 07/02/2020

CLINICAL DATA: Bilateral lower extremity edema and pain. Evaluate
for DVT and venous insufficiency.

EXAM:
BILATERAL LOWER EXTREMITY VENOUS DUPLEX ULTRASOUND
TECHNIQUE: Gray-scale sonography with graded compression, as well as color
Doppler and duplex ultrasound, were performed to evaluate the deep
and superficial veins of both lower extremities. Spectral Doppler
was utilized to evaluate flow at rest and with distal augmentation
maneuvers. A complete superficial venous insufficiency exam was
performed in the upright standing position. I personally performed
the technical portion of the exam.

[Series 1: us extrem low venous · 0.08mm/px · 13 of 71 slices shown]
[im 1/71]
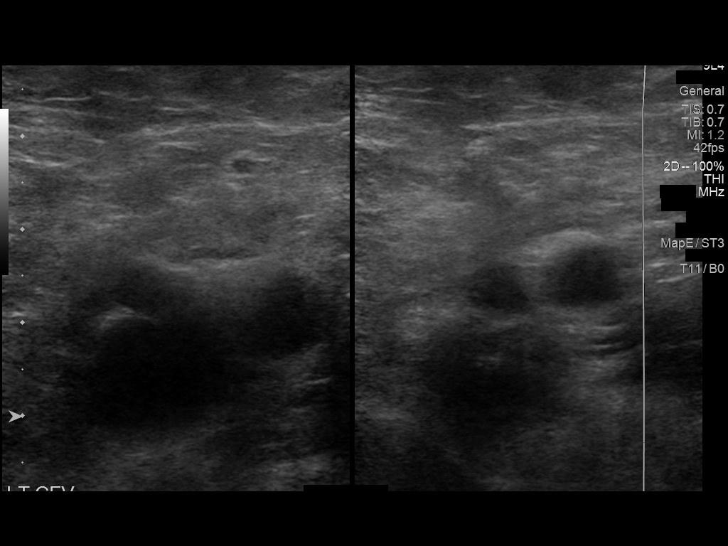
[im 7/71]
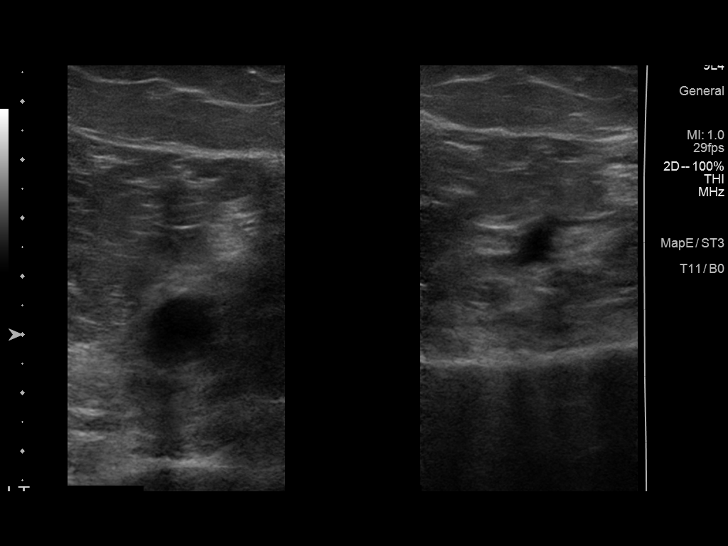
[im 13/71]
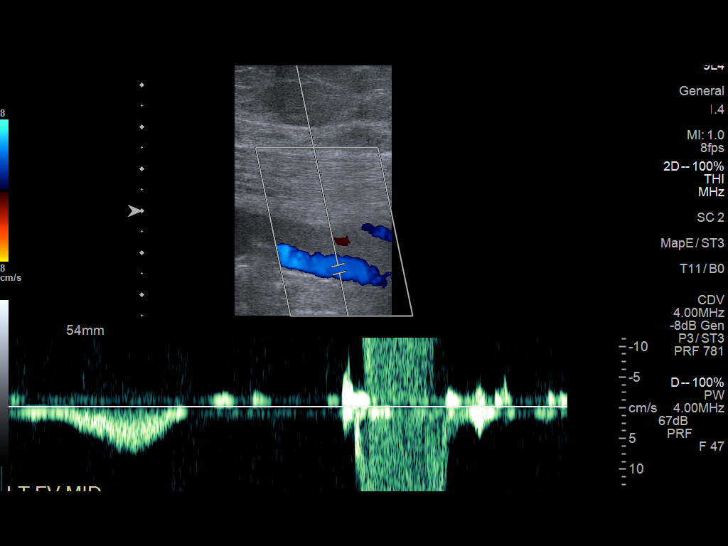
[im 19/71]
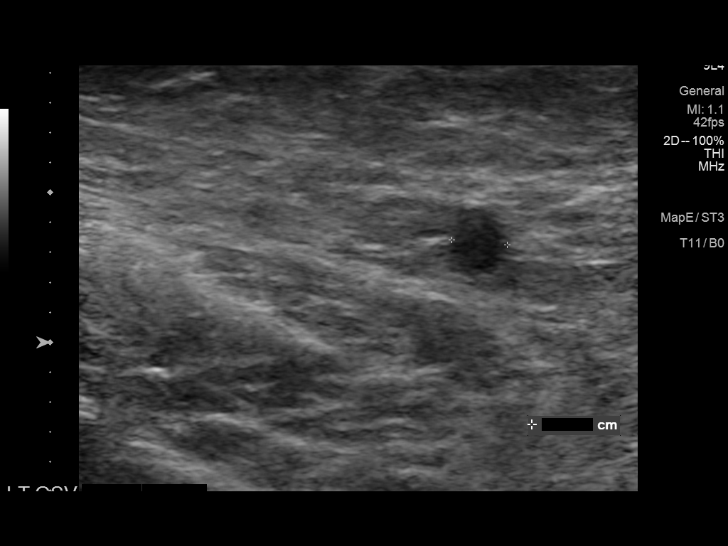
[im 25/71]
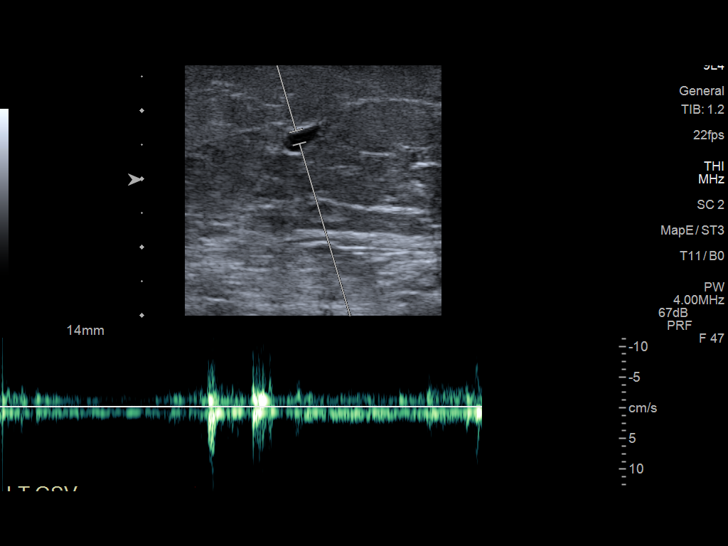
[im 31/71]
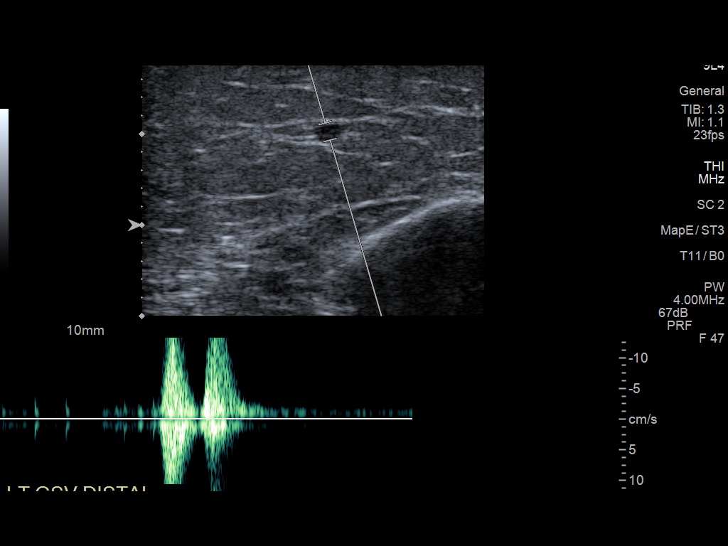
[im 37/71]
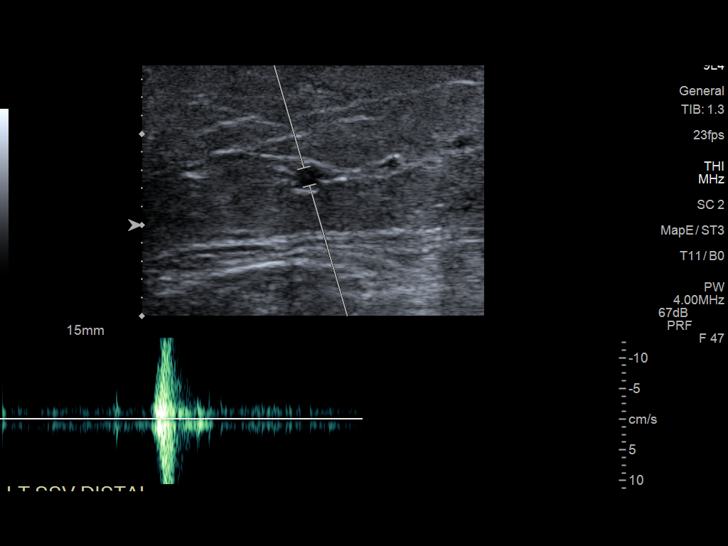
[im 40/71]
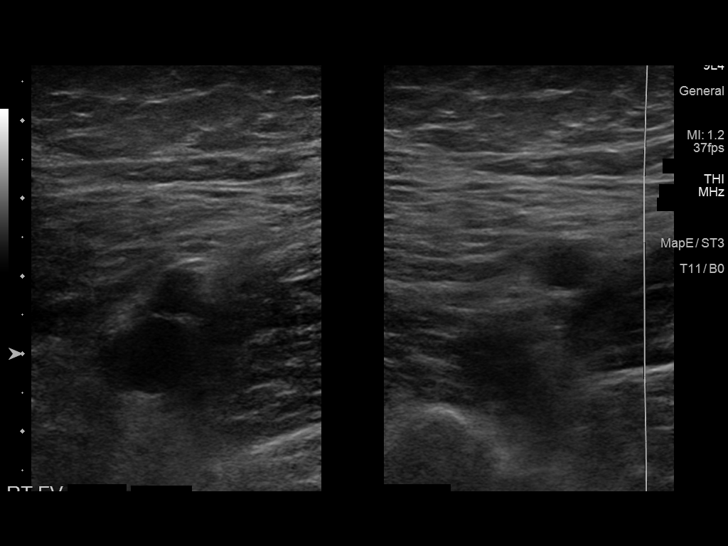
[im 46/71]
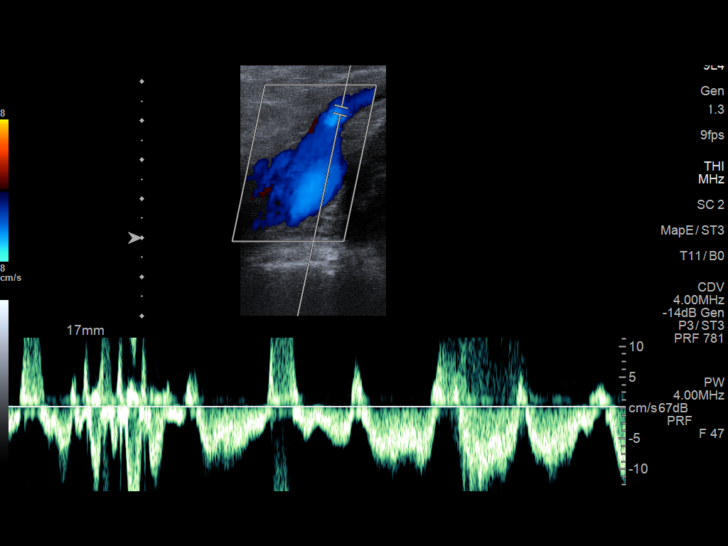
[im 52/71]
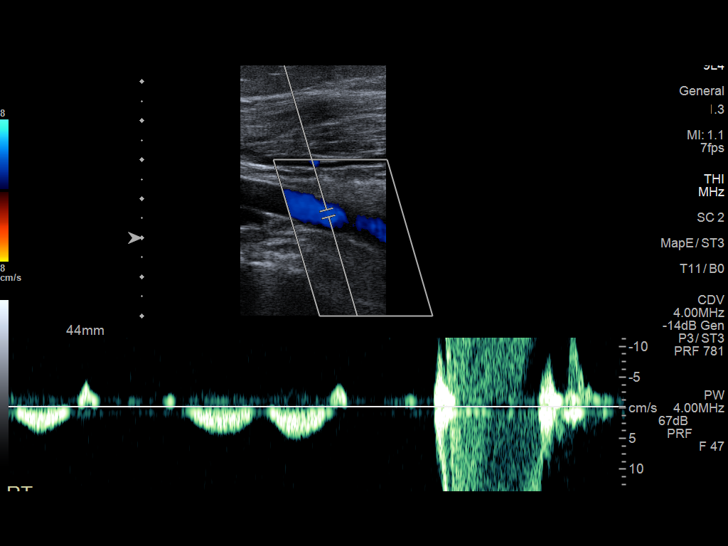
[im 58/71]
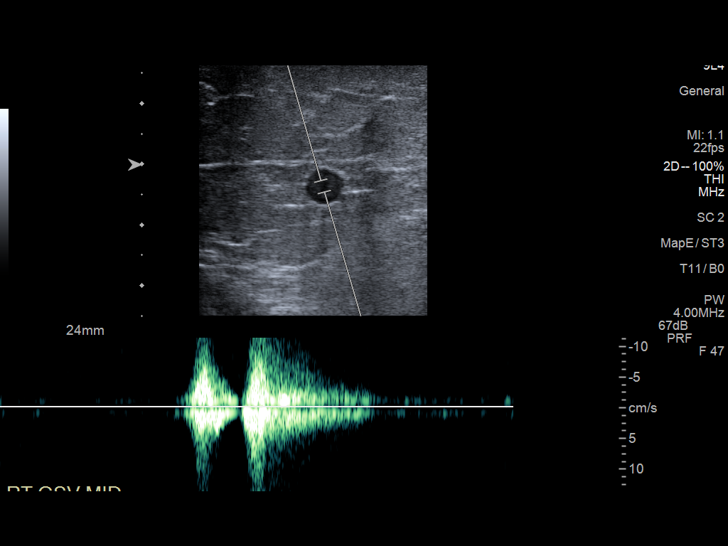
[im 64/71]
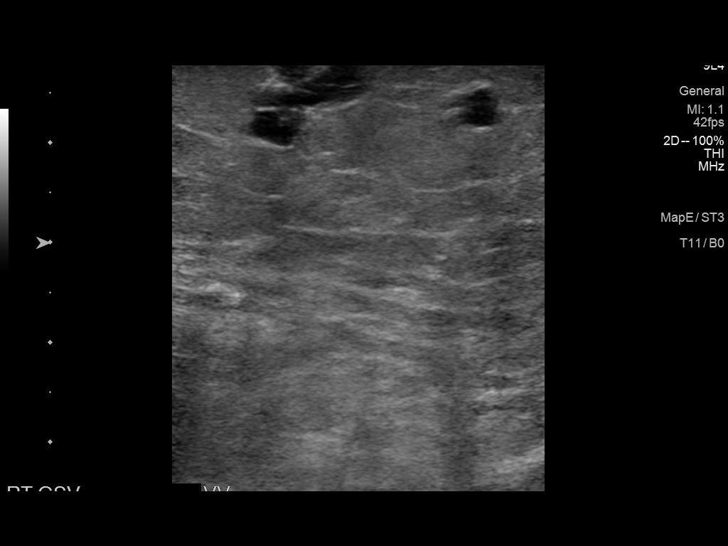
[im 71/71]
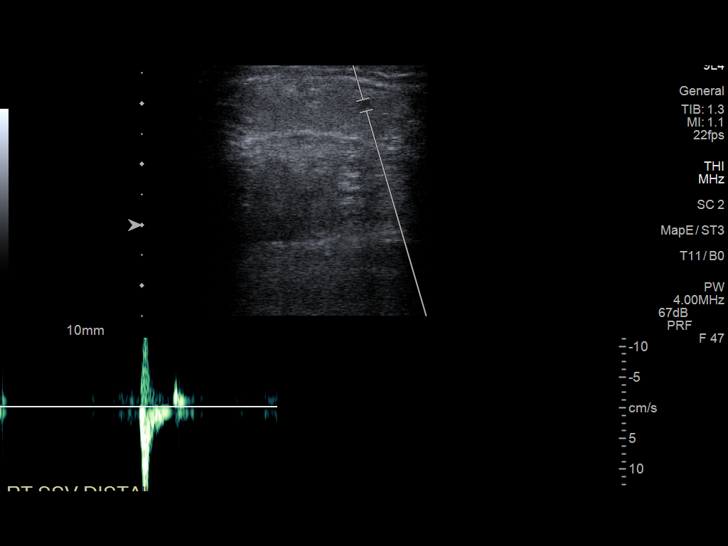

[13 of 24 positions shown; findings below may reference images not displayed]

FINDINGS: Right lower extremity:

Deep venous system: Normal compressibility, augmentation and color
Doppler flow in the right common femoral vein, right femoral vein
and right popliteal vein without thrombus. The right saphenofemoral
junction is patent. Right profunda femoral vein is patent without
thrombus.

Superficial venous system: Reflux at the right saphenofemoral
junction measures 1.9 seconds. Reflux in the proximal thigh right
GSV measures 2.4 seconds. Right thigh proximal thigh GSV measures up
to 6 mm in diameter. Right GSV in the mid thigh has 1.5 seconds of
reflux. Right GSV in the distal thigh has 2.7 seconds of reflux.
Right GSV at the knee has 2.1 seconds of reflux. There are
varicosities coming off the right GSV in the medial right knee and
medial right upper calf. Right GSV in the mid calf has 2.9 seconds
of reflux. No reflux associated with the right short saphenous vein.

Left lower extremity:

Deep venous system: Normal compressibility, augmentation and color
Doppler flow in the left common femoral vein, left femoral vein and
left popliteal vein without thrombus. Left profunda femoral vein is
patent without thrombus. The left saphenofemoral junction is patent.

Superficial venous system: No reflux at the left saphenofemoral
junction. No reflux identified in the left GSV within the thigh. The
left GSV in the proximal thigh measures 4 mm in diameter. Left GSV
in the proximal calf has 2.9 seconds of reflux. Left GSV in the mid
calf has 4.6 seconds of reflux. Small varicosities in the medial
left calf. No significant reflux in the left short saphenous vein.
IMPRESSION: 1. Positive for superficial venous insufficiency in the right thigh
and right calf GSV with varicosities along the medial aspect of the
right knee and right proximal calf.
2. Positive for superficial venous insufficiency in the left GSV
within the left proximal and mid calf.
3.  No evidence of deep venous thrombosis in the lower extremities.

## 2021-09-26 ENCOUNTER — Other Ambulatory Visit: Payer: Self-pay | Admitting: Allergy & Immunology

## 2021-09-27 ENCOUNTER — Ambulatory Visit: Payer: Medicare Other | Attending: Internal Medicine | Admitting: Internal Medicine

## 2021-09-27 DIAGNOSIS — Z7951 Long term (current) use of inhaled steroids: Secondary | ICD-10-CM | POA: Diagnosis not present

## 2021-09-27 DIAGNOSIS — G8929 Other chronic pain: Secondary | ICD-10-CM | POA: Insufficient documentation

## 2021-09-27 DIAGNOSIS — Z79899 Other long term (current) drug therapy: Secondary | ICD-10-CM | POA: Diagnosis not present

## 2021-09-27 DIAGNOSIS — I10 Essential (primary) hypertension: Secondary | ICD-10-CM | POA: Insufficient documentation

## 2021-09-27 DIAGNOSIS — Z6841 Body Mass Index (BMI) 40.0 and over, adult: Secondary | ICD-10-CM | POA: Insufficient documentation

## 2021-09-27 DIAGNOSIS — E785 Hyperlipidemia, unspecified: Secondary | ICD-10-CM | POA: Diagnosis not present

## 2021-09-27 DIAGNOSIS — I158 Other secondary hypertension: Secondary | ICD-10-CM | POA: Insufficient documentation

## 2021-09-27 DIAGNOSIS — Z1331 Encounter for screening for depression: Secondary | ICD-10-CM | POA: Diagnosis not present

## 2021-09-27 DIAGNOSIS — Z7984 Long term (current) use of oral hypoglycemic drugs: Secondary | ICD-10-CM | POA: Insufficient documentation

## 2021-09-27 DIAGNOSIS — E1159 Type 2 diabetes mellitus with other circulatory complications: Secondary | ICD-10-CM | POA: Diagnosis not present

## 2021-09-27 DIAGNOSIS — M1990 Unspecified osteoarthritis, unspecified site: Secondary | ICD-10-CM | POA: Insufficient documentation

## 2021-09-27 DIAGNOSIS — E1169 Type 2 diabetes mellitus with other specified complication: Secondary | ICD-10-CM | POA: Diagnosis not present

## 2021-09-27 DIAGNOSIS — R102 Pelvic and perineal pain: Secondary | ICD-10-CM | POA: Insufficient documentation

## 2021-09-27 DIAGNOSIS — G4733 Obstructive sleep apnea (adult) (pediatric): Secondary | ICD-10-CM | POA: Diagnosis not present

## 2021-09-27 DIAGNOSIS — E119 Type 2 diabetes mellitus without complications: Secondary | ICD-10-CM | POA: Insufficient documentation

## 2021-09-27 DIAGNOSIS — I152 Hypertension secondary to endocrine disorders: Secondary | ICD-10-CM | POA: Diagnosis not present

## 2021-09-27 DIAGNOSIS — F32A Depression, unspecified: Secondary | ICD-10-CM | POA: Diagnosis not present

## 2021-09-27 DIAGNOSIS — J4540 Moderate persistent asthma, uncomplicated: Secondary | ICD-10-CM | POA: Diagnosis not present

## 2021-09-27 LAB — POCT GLYCOSYLATED HEMOGLOBIN (HGB A1C): HbA1c, POC (controlled diabetic range): 6 % (ref 0.0–7.0)

## 2021-09-27 NOTE — Progress Notes (Signed)
? ? ?Patient ID: Cheryl [name], female    DOB: 1955/12/16  MRN: [id number] ? ?CC: Hypertension ? ? ?Subjective: ?Cheryl [name] is a 66 y.o. female who presents for chronic disease management ?Her concerns today include:  ?Pt with hx of DM type 2, HTN, HL, moderate persistent asthma, OSA on CPAP, morbid obesity, arthritis/LBP, chronic pelvic pain syndrome/pelvic floor dysfunction followed by urology Dr. Gorden Harms ? ?DM:   ?Results for orders placed or performed in visit on 09/27/21  ?POCT glycosylated hemoglobin (Hb A1C)  ?Result Value Ref Range  ? Hemoglobin A1C    ? HbA1c POC (<> result, manual entry)    ? HbA1c, POC (prediabetic range)    ? HbA1c, POC (controlled diabetic range) 6.0 0.0 - 7.0 %  ?Patient is diet controlled.  She reports she is doing well with her eating habits.  She saw the dietitian again since last visit with me.  She finds the information helpful and she has tried to put into practice.  Walking outside almost daily. 5000-10000 steps a day.   ? ?HTN: compliant with meds and salt restriction.  She is on lisinopril 10 mg daily and Maxide 75/50 mg daily.  No chest pains.  No swelling in the legs. ? ?HL:  taking and tolerating Zocor. ? ?Asthma: She tells me that this time of the year she is bothered a lot by the pollen.  However she feels things is under good control with her current regimen.  She is on Symbicort inhaler, Xyzal, Flonase nasal spray.  She also gets an allergy shot every 3 weeks through Dr. Ernst Bowler.   ? ?OSA:  using CPAP consistently.   ? ?+Depression screen: PHQ-9 positive today.  However patient states she is not having any issues with depression.  She thinks it was more stressed.  She was recently trying to get a window fixed and was being quoted a very high price is by some companies.  She was finally able to get someone to fix it for her at minimal cost.  Feeling much better since the issue was fixed yesterday.   ? ?HM:  should have MMG coming up this mth through her GYN..  Had COVID  vaccine last wk at [facility] ?Patient Active Problem List  ? Diagnosis Date Noted  ? Hyperlipidemia associated with type 2 diabetes mellitus ([place]) 01/25/2021  ? Type 2 diabetes mellitus with morbid obesity ([place]) 01/25/2021  ? Moderate persistent asthma, uncomplicated 35/57/3220  ? OSA (obstructive sleep apnea) 12/02/2016  ? Hyperlipidemia LDL goal <100 10/21/2014  ? Essential hypertension, benign 10/21/2014  ? DM w/o complication type II ([place]) 09/13/2014  ? Routine general medical examination at a health care facility 10/12/2013  ? 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 Need for prophylactic vaccination and inoculation against influenza 02/08/2013  ? GERD (gastroesophageal reflux disease) 02/08/2013  ? Other and unspecified hyperlipidemia 11/10/2012  ? Type II or unspecified type diabetes mellitus without mention of complication, not stated as uncontrolled 10/25/2012  ? Impaired fasting glucose 09/07/2012  ? Morbid obesity ([place]) 09/07/2012  ? Generalized osteoarthrosis, involving multiple sites 09/07/2012  ? Carpal tunnel syndrome 09/07/2012  ? Other, multiple, and unspecified sites, insect bite, nonvenomous, without mention of infection(919.4) 09/07/2012  ? Special screening for malignant neoplasms, colon 09/07/2012  ? Other specified cardiac dysrhythmias(427.89) 09/07/2012  ? Candidiasis of vulva and vagina 09/07/2012  ? Abdominal pain, generalized 09/07/2012  ? Symptomatic menopausal or female climacteric states 09/07/2012  ? Hypertension associated with diabetes (  [place]) 09/07/2012  ? Perennial allergic rhinitis 09/07/2012  ? Pain in joint, site unspecified 09/07/2012  ? Other malaise and fatigue 09/07/2012  ?  ? ?Current Outpatient Medications on File Prior to Visit  ?Medication Sig Dispense Refill  ? ACCU-CHEK AVIVA PLUS test strip CHECK BLOOD SUGAR ONCE DAILY AS INSTRUCTED DX E11.9 100 each 10  ? acetaminophen (TYLENOL) 500 MG tablet Take 500 mg by mouth daily.    ? albuterol (VENTOLIN HFA) 108  (90 Base) MCG/ACT inhaler TAKE 2 PUFFS BY MOUTH EVERY 6 HOURS AS NEEDED FOR WHEEZE OR SHORTNESS OF BREATH 18 each 1  ? AMBULATORY NON FORMULARY MEDICATION Medication Name: Allergy Injection- every 3 weeks    ? aspirin 81 MG tablet Take 81 mg by mouth daily.    ? Azelastine HCl 137 MCG/SPRAY SOLN USE IN EACH NOSTRIL AS DIRECTED 90 mL 1  ? B-D ULTRA-FINE 33 LANCETS MISC Use to test blood sugar once daily. Dx: E11.9 100 each 6  ? Blood Glucose Monitoring Suppl (PRODIGY AUTOCODE BLOOD GLUCOSE) DEVI by Does not apply route. Daily as directed    ? budesonide-formoterol (SYMBICORT) 160-4.5 MCG/ACT inhaler 2 puffs twice daily 30.6 each 5  ? Calcium Carbonate-Vitamin D 600-400 MG-UNIT chew tablet Chew 1 tablet by mouth daily.     ? EPINEPHrine 0.3 mg/0.3 mL IJ SOAJ injection     ? fluticasone (FLONASE) 50 MCG/ACT nasal spray PLACE 1-2 SPRAYS INTO BOTH NOSTRILS DAILY. 48 mL 0  ? levocetirizine (XYZAL) 5 MG tablet TAKE 1 TABLET BY MOUTH EVERY DAY IN THE EVENING 90 tablet 2  ? lisinopril (ZESTRIL) 10 MG tablet TAKE 1 TABLET BY MOUTH EVERY DAY 30 tablet 1  ? meloxicam (MOBIC) 15 MG tablet Take 15 mg by mouth as needed.    ? Multiple Vitamin (MULTIVITAMIN ADULT PO) 1 tablet    ? nabumetone (RELAFEN) 500 MG tablet Take 500 mg by mouth 2 (two) times daily as needed.    ? omeprazole (PRILOSEC) 40 MG capsule TAKE 1 CAPSULE BY MOUTH EVERY DAY 90 capsule 0  ? Respiratory Therapy Supplies (CARETOUCH 2 CPAP HOSE HANGER) MISC     ? simvastatin (ZOCOR) 20 MG tablet Take one tablet by mouth once daily at bedtime for cholesterol 90 tablet 2  ? triamcinolone cream (KENALOG) 0.1 % Apply 1 application. topically 2 (two) times daily. 30 g 0  ? triamterene-hydrochlorothiazide (MAXZIDE) 75-50 MG tablet TAKE 1 TABLET BY MOUTH EVERY DAY 90 tablet 3  ? VALIUM 10 MG tablet 10 mg at bedtime.    ? vitamin C (ASCORBIC ACID) 500 MG tablet Take 500 mg by mouth every other day.    ? vitamin E 180 MG (400 UNITS) capsule Take 400 Units by mouth every other  day.    ? ?No current facility-administered medications on file prior to visit.  ? ? ?Allergies  ?Allergen Reactions  ? Elemental Sulfur Diarrhea  ? Oxycodone   ?  Had stomach and headache as side effect from medicine  ? ? ?Social History  ? ?Socioeconomic History  ? Marital status: Divorced  ?  Spouse name: Not on file  ? Number of children: Not on file  ? Years of education: Not on file  ? Highest education level: Not on file  ?Occupational History  ? Not on file  ?Tobacco Use  ? Smoking status: Never  ? Smokeless tobacco: Never  ?Vaping Use  ? Vaping Use: Never used  ?Substance and Sexual Activity  ? Alcohol use: Yes  ?  Comment: Seldom- Wine   ? Drug use: No  ? Sexual activity: Not Currently  ?  Comment: post office worker  ?Other Topics Concern  ? Not on file  ?Social History Narrative  ? Not on file  ? ?Social Determinants of Health  ? ?Financial Resource Strain: Not on file  ?Food Insecurity: Not on file  ?Transportation Needs: Not on file  ?Physical Activity: Not on file  ?Stress: Not on file  ?Social Connections: Not on file  ?Intimate Partner Violence: Not on file  ? ? ?Family History  ?Problem Relation Age of Onset  ? Cancer Father   ? Diabetes Father   ? Allergic rhinitis Son   ? Diabetes Brother   ? Allergic rhinitis Son   ? Diabetes Paternal Aunt   ? Lung cancer Cousin   ? Asthma Neg Hx   ? ? ?Past Surgical History:  ?Procedure Laterality Date  ? ABDOMINAL HYSTERECTOMY    ? 1994  ? carpal tunnel both hands    ? 2012  ? CLOSED MANIPULATION SHOULDER Left 04/2014  ? EYE SURGERY Left 08/25/2015  ? EYE SURGERY Right 09/24/2015  ? JOINT REPLACEMENT    ? both knees replacement, 2010  ? mole removed from face    ? Nodule removed from back    ? SHOULDER SURGERY Left 11/2013  ? TONSILLECTOMY    ? as teenager  ? ? ?ROS: ?Review of Systems ?Negative except as stated above ? ?PHYSICAL EXAM: ?BP 123/79   Pulse 62   Ht '5\' 9"'$  (1.753 m)   Wt 290 lb (131.5 kg)   SpO2 99%   BMI 42.83 kg/m?   ?Wt Readings from Last 3  Encounters:  ?09/27/21 290 lb (131.5 kg)  ?08/29/21 295 lb (133.8 kg)  ?08/20/21 295 lb 12.8 oz (134.2 kg)  ? ? ?Physical Exam ? ?General appearance - alert, well appearing, and in no distress ?Mental status

## 2021-09-28 ENCOUNTER — Other Ambulatory Visit: Payer: Self-pay | Admitting: Internal Medicine

## 2021-09-29 ENCOUNTER — Ambulatory Visit (HOSPITAL_COMMUNITY)
Admission: EM | Admit: 2021-09-29 | Discharge: 2021-09-29 | Disposition: A | Discharge status: Home / Self Care | Payer: Medicare Other | Attending: Emergency Medicine | Admitting: Emergency Medicine

## 2021-09-29 ENCOUNTER — Encounter (HOSPITAL_COMMUNITY): Payer: Self-pay

## 2021-09-29 DIAGNOSIS — T7840XA Allergy, unspecified, initial encounter: Secondary | ICD-10-CM | POA: Diagnosis not present

## 2021-09-29 MED ORDER — LEVOCETIRIZINE DIHYDROCHLORIDE 5 MG PO TABS
5.0000 mg | ORAL_TABLET | Freq: Every evening | ORAL | 0 refills | Status: DC
Start: 2021-09-29 — End: 2022-05-21

## 2021-09-29 MED ORDER — METHYLPREDNISOLONE SODIUM SUCC 125 MG IJ SOLR
INTRAMUSCULAR | Status: AC
  Filled 2021-09-29: qty 2

## 2021-09-29 MED ORDER — METHYLPREDNISOLONE SODIUM SUCC 125 MG IJ SOLR
60.0000 mg | Freq: Once | INTRAMUSCULAR | Status: AC
  Administered 2021-09-29: 60 mg via INTRAMUSCULAR

## 2021-09-29 NOTE — ED Provider Notes (Signed)
?[place] ? ? ?[id number] ?09/29/21 Arrival Time: 4268 ?Chief Complaint  ?Patient presents with  ? Joint Swelling  ?  Left  ? ? ? ?SUBJECTIVE: ? ?Cheryl [name] is a 66 y.o. female who presented to the urgent care with a complaint of left wrist swelling and redness and index finger swelling.  Denies pain but reports itching.  She denies any precipitating event and is not insect bite. Has tried OTC allergy ointment without relief.  Denies any alleviating or aggravating factors.  Denies previous symptoms in the past.   Denies fever, chills, nausea, vomiting, erythema, tingling, tongue swelling/tingling, dyspnea, SOB, chest pain, abdominal pain, changes in bowel or bladder function.    ? ?ROS: As per HPI.  All other pertinent ROS negative.    ? ?Past Medical History:  ?Diagnosis Date  ? Allergic rhinitis, cause unspecified   ? Arthritis   ? Asthma   ? Carpal tunnel syndrome   ? Cataract   ? Diabetes mellitus without complication ([place])   ? diet- controlled  ? GERD (gastroesophageal reflux disease)   ? Glaucoma   ? Hypertension   ? Impaired fasting glucose   ? Morbid obesity ([place])   ? OSA (obstructive sleep apnea) 12/02/2016  ? Other malaise and fatigue   ? Other specified cardiac dysrhythmias(427.89)   ? Pain in joint, site unspecified   ? Symptomatic menopausal or female climacteric states   ? ?Past Surgical History:  ?Procedure Laterality Date  ? ABDOMINAL HYSTERECTOMY    ? 1994  ? carpal tunnel both hands    ? 2012  ? CLOSED MANIPULATION SHOULDER Left 04/2014  ? EYE SURGERY Left 08/25/2015  ? EYE SURGERY Right 09/24/2015  ? JOINT REPLACEMENT    ? both knees replacement, 2010  ? mole removed from face    ? Nodule removed from back    ? SHOULDER SURGERY Left 11/2013  ? TONSILLECTOMY    ? as teenager  ? ?Allergies  ?Allergen Reactions  ? Elemental Sulfur Diarrhea  ? Oxycodone   ?  Had stomach and headache as side effect from medicine  ? ?No current facility-administered medications on file prior to encounter.   ? ?Current Outpatient Medications on File Prior to Encounter  ?Medication Sig Dispense Refill  ? ACCU-CHEK AVIVA PLUS test strip CHECK BLOOD SUGAR ONCE DAILY AS INSTRUCTED DX E11.9 100 each 10  ? acetaminophen (TYLENOL) 500 MG tablet Take 500 mg by mouth daily.    ? albuterol (VENTOLIN HFA) 108 (90 Base) MCG/ACT inhaler TAKE 2 PUFFS BY MOUTH EVERY 6 HOURS AS NEEDED FOR WHEEZE OR SHORTNESS OF BREATH 18 each 1  ? AMBULATORY NON FORMULARY MEDICATION Medication Name: Allergy Injection- every 3 weeks    ? aspirin 81 MG tablet Take 81 mg by mouth daily.    ? Azelastine HCl 137 MCG/SPRAY SOLN USE IN EACH NOSTRIL AS DIRECTED 90 mL 1  ? B-D ULTRA-FINE 33 LANCETS MISC Use to test blood sugar once daily. Dx: E11.9 100 each 6  ? Blood Glucose Monitoring Suppl (PRODIGY AUTOCODE BLOOD GLUCOSE) DEVI by Does not apply route. Daily as directed    ? budesonide-formoterol (SYMBICORT) 160-4.5 MCG/ACT inhaler 2 puffs twice daily 30.6 each 5  ? Calcium Carbonate-Vitamin D 600-400 MG-UNIT chew tablet Chew 1 tablet by mouth daily.     ? EPINEPHrine 0.3 mg/0.3 mL IJ SOAJ injection     ? fluticasone (FLONASE) 50 MCG/ACT nasal spray PLACE 1-2 SPRAYS INTO BOTH NOSTRILS DAILY. 48 mL 0  ?  lisinopril (ZESTRIL) 10 MG tablet TAKE 1 TABLET BY MOUTH EVERY DAY 30 tablet 1  ? meloxicam (MOBIC) 15 MG tablet Take 15 mg by mouth as needed.    ? Multiple Vitamin (MULTIVITAMIN ADULT PO) 1 tablet    ? nabumetone (RELAFEN) 500 MG tablet Take 500 mg by mouth 2 (two) times daily as needed.    ? omeprazole (PRILOSEC) 40 MG capsule TAKE 1 CAPSULE BY MOUTH EVERY DAY 90 capsule 0  ? Respiratory Therapy Supplies (CARETOUCH 2 CPAP HOSE HANGER) MISC     ? simvastatin (ZOCOR) 20 MG tablet Take one tablet by mouth once daily at bedtime for cholesterol 90 tablet 2  ? triamcinolone cream (KENALOG) 0.1 % Apply 1 application. topically 2 (two) times daily. 30 g 0  ? triamterene-hydrochlorothiazide (MAXZIDE) 75-50 MG tablet TAKE 1 TABLET BY MOUTH EVERY DAY 90 tablet 3  ?  VALIUM 10 MG tablet 10 mg at bedtime.    ? vitamin C (ASCORBIC ACID) 500 MG tablet Take 500 mg by mouth every other day.    ? vitamin E 180 MG (400 UNITS) capsule Take 400 Units by mouth every other day.    ?  ?Social History  ? ?Socioeconomic History  ? Marital status: Divorced  ?  Spouse name: Not on file  ? Number of children: Not on file  ? Years of education: Not on file  ? Highest education level: Not on file  ?Occupational History  ? Not on file  ?Tobacco Use  ? Smoking status: Never  ? Smokeless tobacco: Never  ?Vaping Use  ? Vaping Use: Never used  ?Substance and Sexual Activity  ? Alcohol use: Yes  ?  Comment: Seldom- Wine   ? Drug use: No  ? Sexual activity: Not Currently  ?  Comment: post office worker  ?Other Topics Concern  ? Not on file  ?Social History Narrative  ? Not on file  ? ?Social Determinants of Health  ? ?Financial Resource Strain: Not on file  ?Food Insecurity: Not on file  ?Transportation Needs: Not on file  ?Physical Activity: Not on file  ?Stress: Not on file  ?Social Connections: Not on file  ?Intimate Partner Violence: Not on file  ? ?Family History  ?Problem Relation Age of Onset  ? Cancer Father   ? Diabetes Father   ? Allergic rhinitis Son   ? Diabetes Brother   ? Allergic rhinitis Son   ? Diabetes Paternal Aunt   ? Lung cancer Cousin   ? Asthma Neg Hx   ? ? ? ?OBJECTIVE: ? ?Vitals:  ? 09/29/21 1742  ?BP: (!) 149/62  ?Pulse: 69  ?Resp: 18  ?Temp: 98.3 ?F (36.8 ?C)  ?TempSrc: Oral  ?SpO2: 100%  ?  ? ?Physical Exam ?Vitals and nursing note reviewed.  ?Constitutional:   ?   General: She is not in acute distress. ?   Appearance: Normal appearance. She is normal weight. She is not ill-appearing, toxic-appearing or diaphoretic.  ?HENT:  ?   Head: Normocephalic.  ?Cardiovascular:  ?   Rate and Rhythm: Normal rate and regular rhythm.  ?   Pulses: Normal pulses.  ?   Heart sounds: Normal heart sounds. No murmur heard. ?  No friction rub. No gallop.  ?Pulmonary:  ?   Effort: Pulmonary effort  is normal. No respiratory distress.  ?   Breath sounds: Normal breath sounds. No stridor. No wheezing, rhonchi or rales.  ?Chest:  ?   Chest wall: No tenderness.  ?Skin: ?  General: Skin is warm.  ?   Findings: Rash present. Rash is macular.  ?   Comments: Swelling and macular rash around left wrist and left index finger  ?Neurological:  ?   Mental Status: She is alert and oriented to person, place, and time.  ?  ? ?ASSESSMENT & PLAN: ? ?1. Allergic reaction, initial encounter   ? ? ?Meds ordered this encounter  ?Medications  ? methylPREDNISolone sodium succinate (SOLU-MEDROL) 125 mg/2 mL injection 60 mg  ? levocetirizine (XYZAL) 5 MG tablet  ?  Sig: Take 1 tablet (5 mg total) by mouth every evening.  ?  Dispense:  30 tablet  ?  Refill:  0  ? ? ?No orders of the defined types were placed in this encounter. ?  ?Discharge instructions ? ?Xyzal was prescribed/take as directed ?Rest push fluids ?Solu-Medrol 60 mg IM was given in office ?Return sooner or go to the ED if you have any new or worsening symptoms  ? ?Reviewed expectations re: course of current medical issues. Questions answered. ?Outlined signs and symptoms indicating need for more acute intervention. ?Patient verbalized understanding. ?After Visit Summary given. ? ? ? ? ? ? ?  ?  ?Emerson Monte, FNP ?09/29/21 1835 ? ?

## 2021-09-29 NOTE — ED Triage Notes (Signed)
Onset yesterday ( or the day before, Pt is unsure) of swelling and redness of left wrist and index finger. No injuries.  Pt is concerned for an insect bite noting itchiness and spot that appears to look like a stinger.  ?

## 2021-09-29 NOTE — Discharge Instructions (Addendum)
Xyzal was prescribed/take as directed ?Rest push fluids ?Solu-Medrol 60 mg IM was given in office ?Return sooner or go to the ED if you have any new or worsening symptoms  ?

## 2021-10-01 ENCOUNTER — Ambulatory Visit (INDEPENDENT_AMBULATORY_CARE_PROVIDER_SITE_OTHER): Payer: Medicare Other

## 2021-10-01 DIAGNOSIS — J309 Allergic rhinitis, unspecified: Secondary | ICD-10-CM

## 2021-10-08 ENCOUNTER — Ambulatory Visit (INDEPENDENT_AMBULATORY_CARE_PROVIDER_SITE_OTHER): Payer: Medicare Other

## 2021-10-08 DIAGNOSIS — J309 Allergic rhinitis, unspecified: Secondary | ICD-10-CM

## 2021-10-08 NOTE — Progress Notes (Signed)
VIAL EXP 10-09-22 ?

## 2021-10-09 DIAGNOSIS — J3089 Other allergic rhinitis: Secondary | ICD-10-CM | POA: Diagnosis not present

## 2021-10-09 NOTE — Progress Notes (Signed)
Cheryl [name]    [id number]    1955/08/12  [facility], Brayton Layman, DO  HPI female never smoker followed for OSA, complicated by asthma( Dr Vaughan Browner), DM 2, HBP, GERD, allergic rhinitis/allergy vaccine (Dr Donneta Romberg), glaucoma NPSG- 11/25/16- AHI 40.3/hour, desaturation to 88%, body weight 286 pounds  ----------------------------------------------------------------   10/08/20-  66 year old female never smoker followed for OSA, complicated by asthma( Dr Vaughan Browner), DM 2, HBP, GERD, allergic rhinitis/allergy vaccine , glaucoma CPAP auto 5-15/ [facility]            AirSense 10 AutoSet Download-compliance 100%, AHI 3.6/ hr    High leak  Body weight today-287 lbs Covid vax- 4 Phizer Doing well. Just got new supplies. Was coughing- better now. Will f/u w/ Dr Vaughan Browner. CXR 03/29/20- IMPRESSION: 1. No active cardiopulmonary disease. 2. No aggressive bony change in the region of the proximal clavicle. An ultrasound could better evaluate the reported palpable lump if clinically indicated.  10/10/21-  66 year old female never smoker(son is Industrial/product designer) followed for OSA, complicated by asthma( Dr Vaughan Browner), DM 2, HBP, GERD, Allergic Rhinitis/Allergy Vaccine ,  CPAP auto 5-15/ [facility]            AirSense 10 AutoSet Download-compliance 97%, AHI 3.4/ hr Body weight today-292 lbs Covid vax- 5 Phizer Flu vax- had -----Patient feels like she is doing good, no concerns She is comfortable with her CPAP and feels that she sleeps well, life better with it than without it.  Download reviewed with her.  Breathing is comfortable.  Machine is about old enough to replace. Walking is somewhat limited-blamed on pollen which she hopes will be getting better in the next few weeks.   ROS-see HPI   + = positive Constitutional:    weight loss, night sweats, fevers, chills, fatigue, lassitude. HEENT:    headaches, difficulty swallowing, tooth/dental problems, sore throat,       sneezing, itching, ear ache,  nasal congestion, post nasal drip, snoring CV:    chest pain, orthopnea, PND, swelling in lower extremities, anasarca,                                            dizziness, palpitations Resp:   shortness of breath with exertion or at rest.                productive cough,   non-productive cough, coughing up of blood.              change in color of mucus.  wheezing.   Skin:    rash or lesions. GI:  No-   heartburn, indigestion, abdominal pain, nausea, vomiting, diarrhea,                 change in bowel habits, loss of appetite GU: dysuria, change in color of urine, no urgency or frequency.   flank pain. MS:   joint pain, stiffness, decreased range of motion, back pain. Neuro-     nothing unusual Psych:  change in mood or affect.  depression or anxiety.   memory loss.  OBJ- Physical Exam General- Alert, Oriented, Affect-appropriate, Distress- none acute, + morbidly obese Skin- rash-none, lesions- none, excoriation- none Lymphadenopathy- none Head- atraumatic            Eyes- Gross vision intact, PERRLA, conjunctivae and secretions clear  Ears- Hearing, canals-normal            Nose- Clear, no-Septal dev, mucus, polyps, erosion, perforation             Throat- Mallampati IV , mucosa clear , drainage- none, tonsils- atrophic, + own teeth Neck- flexible , trachea midline, no stridor , thyroid nl, carotid no bruit Chest - symmetrical excursion , unlabored           Heart/CV- RRR , no murmur , no gallop  , no rub, nl s1 s2                           - JVD- none , edema- none, stasis changes- none, varices- none           Lung- clear to P&A, wheeze- none, cough- none , dullness-none, rub- none           Chest wall-  Abd-  Br/ Gen/ Rectal- Not done, not indicated Extrem- cyanosis- none, clubbing, none, atrophy- none, strength- nl Neuro- grossly intact to observation

## 2021-10-10 ENCOUNTER — Ambulatory Visit (INDEPENDENT_AMBULATORY_CARE_PROVIDER_SITE_OTHER): Payer: Medicare Other | Admitting: Internal Medicine

## 2021-10-10 ENCOUNTER — Encounter: Payer: Self-pay | Admitting: Internal Medicine

## 2021-10-10 VITALS — BP 110/60 | HR 71 | Temp 98.2°F | Ht 68.0 in | Wt 292.6 lb

## 2021-10-10 DIAGNOSIS — G4733 Obstructive sleep apnea (adult) (pediatric): Secondary | ICD-10-CM | POA: Diagnosis not present

## 2021-10-10 NOTE — Assessment & Plan Note (Signed)
Benefits from CPAP with good compliance and control Plan-replace old machine, continuing auto 5-15

## 2021-10-10 NOTE — Assessment & Plan Note (Signed)
Plan-encourage walking and efforts to lose weight which will help several medical problems.

## 2021-10-10 NOTE — Patient Instructions (Signed)
Order DME [facility]- please replace old CPAP machine, auto 5-15, mask of choice, humidifier, supplies, AirView/ card  Please call if we can help   

## 2021-10-15 ENCOUNTER — Ambulatory Visit: Payer: Medicare Other | Admitting: Registered"

## 2021-10-17 ENCOUNTER — Ambulatory Visit: Payer: Medicare Other | Admitting: Dietician

## 2021-10-22 ENCOUNTER — Telehealth: Payer: Self-pay | Admitting: Internal Medicine

## 2021-10-22 DIAGNOSIS — Z1231 Encounter for screening mammogram for malignant neoplasm of breast: Secondary | ICD-10-CM

## 2021-10-22 NOTE — Telephone Encounter (Signed)
Copied from [place] [id number]. Topic: General - Other >> Oct 22, 2021  2:31 PM Loma Boston wrote: Reason for CRM: Pt would like an order to be placed for a Mammogram. Pls fu at [phone]

## 2021-10-23 NOTE — Telephone Encounter (Signed)
Patient aware.

## 2021-10-25 ENCOUNTER — Ambulatory Visit (INDEPENDENT_AMBULATORY_CARE_PROVIDER_SITE_OTHER): Payer: Medicare Other

## 2021-10-25 DIAGNOSIS — J309 Allergic rhinitis, unspecified: Secondary | ICD-10-CM | POA: Diagnosis not present

## 2021-10-31 DIAGNOSIS — S0003XA Contusion of scalp, initial encounter: Secondary | ICD-10-CM | POA: Diagnosis not present

## 2021-10-31 DIAGNOSIS — E876 Hypokalemia: Secondary | ICD-10-CM | POA: Diagnosis not present

## 2021-10-31 DIAGNOSIS — R519 Headache, unspecified: Secondary | ICD-10-CM | POA: Diagnosis not present

## 2021-10-31 DIAGNOSIS — Z6841 Body Mass Index (BMI) 40.0 and over, adult: Secondary | ICD-10-CM | POA: Diagnosis not present

## 2021-10-31 DIAGNOSIS — G4733 Obstructive sleep apnea (adult) (pediatric): Secondary | ICD-10-CM | POA: Diagnosis not present

## 2021-10-31 DIAGNOSIS — M8580 Other specified disorders of bone density and structure, unspecified site: Secondary | ICD-10-CM | POA: Diagnosis not present

## 2021-10-31 DIAGNOSIS — J45909 Unspecified asthma, uncomplicated: Secondary | ICD-10-CM | POA: Diagnosis not present

## 2021-10-31 DIAGNOSIS — Z79899 Other long term (current) drug therapy: Secondary | ICD-10-CM | POA: Diagnosis not present

## 2021-10-31 DIAGNOSIS — Z791 Long term (current) use of non-steroidal anti-inflammatories (NSAID): Secondary | ICD-10-CM | POA: Diagnosis not present

## 2021-10-31 DIAGNOSIS — R0789 Other chest pain: Secondary | ICD-10-CM | POA: Diagnosis not present

## 2021-10-31 DIAGNOSIS — I10 Essential (primary) hypertension: Secondary | ICD-10-CM | POA: Diagnosis not present

## 2021-10-31 DIAGNOSIS — R55 Syncope and collapse: Secondary | ICD-10-CM | POA: Diagnosis not present

## 2021-10-31 DIAGNOSIS — W1839XA Other fall on same level, initial encounter: Secondary | ICD-10-CM | POA: Diagnosis not present

## 2021-10-31 DIAGNOSIS — R7303 Prediabetes: Secondary | ICD-10-CM | POA: Diagnosis not present

## 2021-10-31 DIAGNOSIS — I493 Ventricular premature depolarization: Secondary | ICD-10-CM | POA: Diagnosis not present

## 2021-11-01 DIAGNOSIS — I517 Cardiomegaly: Secondary | ICD-10-CM | POA: Diagnosis not present

## 2021-11-01 DIAGNOSIS — R55 Syncope and collapse: Secondary | ICD-10-CM | POA: Diagnosis not present

## 2021-11-01 DIAGNOSIS — E876 Hypokalemia: Secondary | ICD-10-CM | POA: Diagnosis not present

## 2021-11-01 DIAGNOSIS — M8580 Other specified disorders of bone density and structure, unspecified site: Secondary | ICD-10-CM | POA: Diagnosis not present

## 2021-11-01 DIAGNOSIS — I10 Essential (primary) hypertension: Secondary | ICD-10-CM | POA: Diagnosis not present

## 2021-11-01 DIAGNOSIS — I340 Nonrheumatic mitral (valve) insufficiency: Secondary | ICD-10-CM | POA: Diagnosis not present

## 2021-11-01 DIAGNOSIS — R7303 Prediabetes: Secondary | ICD-10-CM | POA: Diagnosis not present

## 2021-11-01 DIAGNOSIS — G4733 Obstructive sleep apnea (adult) (pediatric): Secondary | ICD-10-CM | POA: Diagnosis not present

## 2021-12-12 ENCOUNTER — Encounter: Payer: Self-pay | Admitting: Internal Medicine

## 2021-12-12 ENCOUNTER — Ambulatory Visit: Payer: Medicare Other | Attending: Internal Medicine | Admitting: Internal Medicine

## 2021-12-12 VITALS — BP 122/83 | HR 77 | Wt 291.0 lb

## 2021-12-12 DIAGNOSIS — E785 Hyperlipidemia, unspecified: Secondary | ICD-10-CM | POA: Diagnosis not present

## 2021-12-12 DIAGNOSIS — E1159 Type 2 diabetes mellitus with other circulatory complications: Secondary | ICD-10-CM | POA: Insufficient documentation

## 2021-12-12 DIAGNOSIS — Z79899 Other long term (current) drug therapy: Secondary | ICD-10-CM | POA: Insufficient documentation

## 2021-12-12 DIAGNOSIS — J4540 Moderate persistent asthma, uncomplicated: Secondary | ICD-10-CM | POA: Insufficient documentation

## 2021-12-12 DIAGNOSIS — Z6841 Body Mass Index (BMI) 40.0 and over, adult: Secondary | ICD-10-CM

## 2021-12-12 DIAGNOSIS — G4733 Obstructive sleep apnea (adult) (pediatric): Secondary | ICD-10-CM | POA: Diagnosis not present

## 2021-12-12 DIAGNOSIS — I10 Essential (primary) hypertension: Secondary | ICD-10-CM | POA: Insufficient documentation

## 2021-12-12 DIAGNOSIS — E1169 Type 2 diabetes mellitus with other specified complication: Secondary | ICD-10-CM | POA: Diagnosis not present

## 2021-12-12 DIAGNOSIS — R55 Syncope and collapse: Secondary | ICD-10-CM

## 2021-12-12 LAB — GLUCOSE, POCT (MANUAL RESULT ENTRY): POC Glucose: 126 mg/dl — AB (ref 70–99)

## 2021-12-12 NOTE — Progress Notes (Signed)
Patient ID: Cheryl [name], female    DOB: 1955-08-16  MRN: [id number]  CC: Hospitalization Follow-up   Subjective: Cheryl [name] is a 67 y.o. female who presents for hospital follow-up Her concerns today include:  Pt with hx of DM type 2, HTN, HL, moderate persistent asthma, OSA on CPAP, morbid obesity, arthritis/LBP, chronic pelvic pain syndrome/pelvic floor dysfunction followed by urology Dr. Gorden Harms  Patient hospitalized for 1 day in [place] 6/8-01/2022 due to syncope. Patient reports she was standing in her friend's kitchen making breakfast. She remembers  waking up on the floor with her friend helping her. Reports feeling weak before the collapse.  Friend did not report any seizure activity, no loss of bowel or bladder function.  Patient denies any chest pains, palpitations or dizziness before the episode.  No previous fainting spells. Normally drinks half a gallon of water a day but did not drink much the day before when she was driving to [place] because she did not want to have to make frequent bathroom stops.  She is on triamterene/HCTZ. Patient brings discharge information given to her. Reports having had CT of the head, echocardiogram, chest x-ray and Doppler ultrasound of the legs all of which she reports were negative. H/H12.4/37, creatinine 0.87, magnesium 2.2, potassium 3.5. 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 05/03/2013   Need for prophylactic vaccination and  inoculation against influenza 02/08/2013   GERD (gastroesophageal reflux disease) 02/08/2013   Other and unspecified hyperlipidemia 11/10/2012   Type II or unspecified type diabetes mellitus without mention of complication, not stated as uncontrolled 10/25/2012   Impaired fasting glucose 09/07/2012   Morbid obesity ([place]) 09/07/2012   Generalized osteoarthrosis, involving multiple sites 09/07/2012   Carpal tunnel syndrome 09/07/2012   Other, multiple, and unspecified sites, insect bite, nonvenomous, without mention of infection(919.4) 09/07/2012   Special screening for malignant neoplasms, colon 09/07/2012   Other specified cardiac dysrhythmias(427.89) 09/07/2012   Candidiasis of vulva and vagina 09/07/2012   Abdominal pain, generalized 09/07/2012   Symptomatic menopausal or female climacteric states 09/07/2012   Hypertension associated with diabetes ([place]) 09/07/2012   Perennial allergic rhinitis 09/07/2012   Pain in joint, site unspecified 09/07/2012   Other malaise and fatigue 09/07/2012     Current Outpatient Medications on File Prior to Visit  Medication Sig Dispense Refill   ACCU-CHEK AVIVA PLUS test strip CHECK BLOOD SUGAR ONCE DAILY AS INSTRUCTED DX E11.9 100 each 10   acetaminophen (TYLENOL) 500 MG tablet Take 500 mg by mouth daily.     albuterol (VENTOLIN HFA) 108 (90 Base) MCG/ACT inhaler TAKE 2 PUFFS BY MOUTH EVERY 6 HOURS AS NEEDED FOR WHEEZE OR SHORTNESS OF BREATH 18 each 1   AMBULATORY NON FORMULARY MEDICATION Medication Name: Allergy Injection- every 3 weeks     aspirin 81 MG tablet Take 81 mg by mouth daily.     Azelastine HCl 137 MCG/SPRAY SOLN USE IN EACH NOSTRIL AS DIRECTED 90 mL 1   B-D ULTRA-FINE 33 LANCETS MISC Use to test blood sugar  once daily. Dx: E11.9 100 each 6   Blood Glucose Monitoring Suppl (PRODIGY AUTOCODE BLOOD GLUCOSE) DEVI by Does not apply route. Daily as directed     budesonide-formoterol (SYMBICORT) 160-4.5 MCG/ACT inhaler 2 puffs twice daily 30.6  each 5   Calcium Carbonate-Vitamin D 600-400 MG-UNIT chew tablet Chew 1 tablet by mouth daily.      EPINEPHrine 0.3 mg/0.3 mL IJ SOAJ injection      fluticasone (FLONASE) 50 MCG/ACT nasal spray PLACE 1-2 SPRAYS INTO BOTH NOSTRILS DAILY. 48 mL 0   levocetirizine (XYZAL) 5 MG tablet Take 1 tablet (5 mg total) by mouth every evening. 30 tablet 0   lisinopril (ZESTRIL) 10 MG tablet TAKE 1 TABLET BY MOUTH EVERY DAY 30 tablet 1   meloxicam (MOBIC) 15 MG tablet Take 15 mg by mouth as needed.     Multiple Vitamin (MULTIVITAMIN ADULT PO) 1 tablet     nabumetone (RELAFEN) 500 MG tablet Take 500 mg by mouth 2 (two) times daily as needed.     omeprazole (PRILOSEC) 40 MG capsule TAKE 1 CAPSULE BY MOUTH EVERY DAY 90 capsule 0   Respiratory Therapy Supplies (CARETOUCH 2 CPAP HOSE HANGER) MISC      simvastatin (ZOCOR) 20 MG tablet Take one tablet by mouth once daily at bedtime for cholesterol 90 tablet 2   triamcinolone cream (KENALOG) 0.1 % Apply 1 application. topically 2 (two) times daily. 30 g 0   triamterene-hydrochlorothiazide (MAXZIDE) 75-50 MG tablet TAKE 1 TABLET BY MOUTH EVERY DAY 90 tablet 3   VALIUM 10 MG tablet 10 mg at bedtime.     vitamin C (ASCORBIC ACID) 500 MG tablet Take 500 mg by mouth every other day.     vitamin E 180 MG (400 UNITS) capsule Take 400 Units by mouth every other day.     No current facility-administered medications on file prior to visit.    Allergies  Allergen Reactions   Elemental Sulfur Diarrhea   Oxycodone     Had stomach and headache as side effect from medicine    Social History   Socioeconomic History   Marital status: Divorced    Spouse name: Not on file   Number of children: Not on file   Years of education: Not on file   Highest education level: Not on file  Occupational History   Not on file  Tobacco Use   Smoking status: Never   Smokeless tobacco: Never  Vaping Use   Vaping Use: Never used  Substance and Sexual Activity   Alcohol use: Yes     Comment: Seldom- Wine    Drug use: No   Sexual activity: Not Currently    Comment: post office worker  Other Topics Concern   Not on file  Social History Narrative   Not on file   Social Determinants of Health   Financial Resource Strain: Low Risk  (11/13/2017)   Overall Financial Resource Strain (CARDIA)    Difficulty of Paying Living Expenses: Not hard at all  Food Insecurity: No Food Insecurity (11/13/2017)   Hunger Vital Sign    Worried About Running Out of Food in the Last Year: Never true    [place] in the Last Year: Never true  Transportation Needs: No Transportation Needs (11/13/2017)   PRAPARE - Hydrologist (Medical): No    Lack of Transportation (Non-Medical): No  Physical Activity: Inactive (11/13/2017)   Exercise Vital Sign    Days of Exercise per  Week: 0 days    Minutes of Exercise per Session: 0 min  Stress: Stress Concern Present (11/13/2017)   [place]    Feeling of Stress : To some extent  Social Connections: Moderately Isolated (11/13/2017)   Social Connection and Isolation Panel [NHANES]    Frequency of Communication with Friends and Family: More than three times a week    Frequency of Social Gatherings with Friends and Family: Never    Attends Religious Services: Never    Marine scientist or Organizations: No    Attends Archivist Meetings: Never    Marital Status: Never married  Intimate Partner Violence: Not At Risk (11/13/2017)   Humiliation, Afraid, Rape, and Kick questionnaire    Fear of Current or Ex-Partner: No    Emotionally Abused: No    Physically Abused: No    Sexually Abused: No    Family History  Problem Relation Age of Onset   Cancer Father    Diabetes Father    Allergic rhinitis Son    Diabetes Brother    Allergic rhinitis Son    Diabetes Paternal Aunt    Lung cancer Cousin    Asthma Neg Hx     Past Surgical  History:  Procedure Laterality Date   ABDOMINAL HYSTERECTOMY     1994   carpal tunnel both hands     2012   CLOSED MANIPULATION SHOULDER Left 04/2014   EYE SURGERY Left 08/25/2015   EYE SURGERY Right 09/24/2015   JOINT REPLACEMENT     both knees replacement, 2010   mole removed from face     Nodule removed from back     SHOULDER SURGERY Left 11/2013   TONSILLECTOMY     as teenager    ROS: Review of Systems Negative except as stated above  PHYSICAL EXAM: BP 122/83   Pulse 77   Wt 291 lb (132 kg)   SpO2 94%   BMI 44.25 kg/m   Physical Exam  General appearance - alert, well appearing, obese older African-American female and in no distress Mental status - normal mood, behavior, speech, dress, motor activity, and thought processes Eyes - pupils equal and reactive, extraocular eye movements intact Mouth - mucous membranes moist, pharynx normal without lesions Chest - clear to auscultation, no wheezes, rales or rhonchi, symmetric air entry Heart - normal rate, regular rhythm, normal S1, S2, no murmurs, rubs, clicks or gallops Extremities - peripheral pulses normal, no pedal edema, no clubbing or cyanosis      Latest Ref Rng & Units 01/25/2021    2:38 PM 08/17/2018    9:34 AM 07/23/2018   10:45 AM  CMP  Glucose 65 - 99 mg/dL 92  101  99   BUN 8 - 27 mg/dL '12  9  12   '$ Creatinine 0.57 - 1.00 mg/dL 0.97  0.83  0.70   Sodium 134 - 144 mmol/L 141  142  142   Potassium 3.5 - 5.2 mmol/L 4.4  3.8  3.6   Chloride 96 - 106 mmol/L 100  103  103   CO2 20 - 29 mmol/L '24  31  30   '$ Calcium 8.7 - 10.3 mg/dL 10.2  9.9  9.9   Total Protein 6.0 - 8.5 g/dL 7.5   7.1   Total Bilirubin 0.0 - 1.2 mg/dL 0.3   0.5   Alkaline Phos 44 - 121 IU/L 68     AST 0 - 40  IU/L 24   17   ALT 0 - 32 IU/L 14   14    Lipid Panel     Component Value Date/Time   CHOL 165 01/25/2021 1438   TRIG 148 01/25/2021 1438   HDL 51 01/25/2021 1438   CHOLHDL 3.2 01/25/2021 1438   CHOLHDL 3.0 07/23/2018 1045    VLDL 20 11/17/2016 1007   LDLCALC 88 01/25/2021 1438   LDLCALC 86 07/23/2018 1045    CBC    Component Value Date/Time   WBC 8.2 01/25/2021 1438   WBC 9.4 11/13/2017 1025   RBC 4.85 01/25/2021 1438   RBC 4.80 11/13/2017 1025   HGB 13.2 01/25/2021 1438   HCT 39.8 01/25/2021 1438   PLT 344 01/25/2021 1438   MCV 82 01/25/2021 1438   MCH 27.2 01/25/2021 1438   MCH 26.9 (L) 11/13/2017 1025   MCHC 33.2 01/25/2021 1438   MCHC 33.3 11/13/2017 1025   RDW 14.5 01/25/2021 1438   LYMPHSABS 2.3 07/29/2018 1443   MONOABS 576 11/17/2016 1007   EOSABS 0.2 07/29/2018 1443   BASOSABS 0.0 07/29/2018 1443   Results for orders placed or performed in visit on 12/12/21  POCT glucose (manual entry)  Result Value Ref Range   POC Glucose 126 (A) 70 - 99 mg/dl    ASSESSMENT AND PLAN: 1. Syncope and collapse -Only prodromal symptom she had was weakness and reports having not drank a lot of water as she usually would the day before the episode.  She is on triamterene/HCTZ.  Work-up in the hospital was reportedly unrevealing except for slightly low potassium.  This may have been vasovagal but cannot rule out an arrhythmia.  Advised patient to stay hydrated especially with her being on diuretic.  Will refer to cardiology to see whether any monitoring is needed. Patient to sign a release for [place] to get records from that hospitalization. - Basic Metabolic Panel - Ambulatory referral to Cardiology  2. Type 2 diabetes mellitus with morbid obesity ([place]) This was not addressed on her visit today. - POCT glucose (manual entry)     Patient was given the opportunity to ask questions.  Patient verbalized understanding of the plan and was able to repeat key elements of the plan.   This documentation was completed using Radio producer.  Any transcriptional errors are unintentional.  Orders Placed This Encounter  Procedures   POCT glucose (manual entry)     Requested Prescriptions     No prescriptions requested or ordered in this encounter    No follow-ups on file.  Karle Plumber, MD, FACP

## 2021-12-12 NOTE — Patient Instructions (Signed)
Make sure that you are drinking at least 48 glasses of water daily to stay hydrated.  I have referred you to the cardiologist.  They will call you with this appointment.  Please sign a release for [place] to get your records from your recent hospitalization in [place].

## 2021-12-13 LAB — BASIC METABOLIC PANEL
BUN/Creatinine Ratio: 14 (ref 12–28)
BUN: 14 mg/dL (ref 8–27)
CO2: 24 mmol/L (ref 20–29)
Calcium: 10.1 mg/dL (ref 8.7–10.3)
Chloride: 101 mmol/L (ref 96–106)
Creatinine, Ser: 0.97 mg/dL (ref 0.57–1.00)
Glucose: 107 mg/dL — ABNORMAL HIGH (ref 70–99)
Potassium: 4.4 mmol/L (ref 3.5–5.2)
Sodium: 143 mmol/L (ref 134–144)
eGFR: 64 mL/min/{1.73_m2} (ref >59)

## 2021-12-16 ENCOUNTER — Ambulatory Visit
Admission: RE | Admit: 2021-12-16 | Discharge: 2021-12-16 | Disposition: A | Discharge status: Home / Self Care | Payer: Medicare Other | Source: Ambulatory Visit | Attending: Internal Medicine | Admitting: Internal Medicine

## 2021-12-16 DIAGNOSIS — Z1231 Encounter for screening mammogram for malignant neoplasm of breast: Secondary | ICD-10-CM

## 2021-12-24 ENCOUNTER — Ambulatory Visit (INDEPENDENT_AMBULATORY_CARE_PROVIDER_SITE_OTHER): Payer: Medicare Other

## 2021-12-24 DIAGNOSIS — J309 Allergic rhinitis, unspecified: Secondary | ICD-10-CM

## 2021-12-26 ENCOUNTER — Ambulatory Visit: Payer: Medicare Other | Admitting: Allergy & Immunology

## 2021-12-30 ENCOUNTER — Encounter: Payer: Self-pay | Admitting: Dietician

## 2021-12-30 ENCOUNTER — Encounter: Payer: Medicare Other | Attending: Internal Medicine | Admitting: Dietician

## 2021-12-30 DIAGNOSIS — E119 Type 2 diabetes mellitus without complications: Secondary | ICD-10-CM | POA: Diagnosis not present

## 2021-12-30 NOTE — Patient Instructions (Addendum)
Add a protein with each meal.  Fruit with yogurt or cottage cheese or a small handful of nuts Consider egg beaters or egg whites rather than whole eggs,  Saute vegetables and add the egg beaters to improve the flavor.  Consider a sugar free fudge sickle if needed for a treat.  Add beans to your vegetable meal in the evening.

## 2021-12-30 NOTE — Progress Notes (Signed)
Diabetes Self-Management Education  Visit Type: Follow-up  Appt. Start Time: 1405 Appt. End Time: 4034  12/30/2021  [name], identified by name and date of birth, is a 66 y.o. female with a diagnosis of Diabetes:  .   ASSESSMENT Patient is here today alone.  She was last seen by another RD in the office 06/12/2021.  She went out of town to [place] in June and she passed out.  She had a medical workup that did not show anything but is to follow up with her cardiologist.  She states her potassium was low and cholesterol was high.  Fasting blood sugar 107 this am and 149 after a meal. She is walking at a church near her 3 times per week for 30 minutes.  She is also tracking her steps on her phone. Trying to eat heaviest meal at lunch and vegetables at dinner.  Trying to stop eating when she is satisfied.  History includes HTN, OSA on C-pap, glaucoma, type 2 diabetes "for years".    Weight hx: 68" 288 lbs 12/30/2021 290 lbs 2020 Lost 40 lbs in the past but regained after her mother's death.   Patient lives alone.  She is retired from [employer].   Diabetes Self-Management Education - 12/30/21 1507       Visit Information   Visit Type Follow-up      Psychosocial Assessment   What is the hardest part about your diabetes right now, causing you the most concern, or is the most worrisome to you about your diabetes?   Making healty food and beverage choices    Self-care barriers None    Other persons present Patient    Special Needs None    Preferred Learning Style No preference indicated    Learning Readiness Ready      Pre-Education Assessment   Patient understands the diabetes disease and treatment process. Comprehends key points    Patient understands incorporating nutritional management into lifestyle. Needs Review    Patient undertands incorporating physical activity into lifestyle. Demonstrates understanding / competency    Patient understands using medications safely.  Comprehends key points    Patient understands monitoring blood glucose, interpreting and using results Comprehends key points    Patient understands prevention, detection, and treatment of acute complications. Comprehends key points    Patient understands prevention, detection, and treatment of chronic complications. Compreheands key points    Patient understands how to develop strategies to address psychosocial issues. Comprehends key points    Patient understands how to develop strategies to promote health/change behavior. Comprehends key points      Complications   Last HgB A1C per patient/outside source 6 %   09/27/2021   How often do you check your blood sugar? 1-2 times/day    Fasting Blood glucose range (mg/dL) 70-129    Postprandial Blood glucose range (mg/dL) 130-179      Dietary Intake   Breakfast Zero Yogurt, granola, nuts OR [place], cheese, and eggs, [employer] toast with jelly    Lunch Meat, vegetable, potato or brown rice    Snack (afternoon) Snack:  nuts OR fruit OR occasional chips or popcorn    Dinner green beans, tomatoes OR squash and tomatoes and occasional potatoes    Snack (evening) cucumbers, vinegar and salt OR occasional ice cream    Beverage(s) water, zero Mt. Dew, gatorade zero, diet cranberry juice, occasional coffee with cream and 2 tsp sugar      Activity / Exercise   Activity /  Exercise Type Light (walking / raking leaves)    How many days per week do you exercise? 3    How many minutes per day do you exercise? 30    Total minutes per week of exercise 90      Patient Education   Previous Diabetes Education Yes (please comment)   05/2021   Healthy Eating Meal options for control of blood glucose level and chronic complications.;Food label reading, portion sizes and measuring food.    Being Active Other (comment)   encouraged     Individualized Goals (developed by patient)   Nutrition General guidelines for healthy choices and portions discussed    Physical  Activity Exercise 3-5 times per week;30 minutes per day    Medications Not Applicable    Monitoring  Test my blood glucose as discussed    Problem Solving Eating Pattern      Patient Self-Evaluation of Goals - Patient rates self as meeting previously set goals (% of time)   Nutrition 50 - 75 % (half of the time)    Physical Activity 50 - 75 % (half of the time)    Medications Not Applicable    Monitoring >75% (most of the time)    Problem Solving and behavior change strategies  50 - 75 % (half of the time)    Reducing Risk (treating acute and chronic complications) 50 - 75 % (half of the time)    Health Coping 50 - 75 % (half of the time)      Post-Education Assessment   Patient understands the diabetes disease and treatment process. Needs Review    Patient understands incorporating nutritional management into lifestyle. Comprehends key points    Patient undertands incorporating physical activity into lifestyle. Comprehends key points    Patient understands using medications safely. Demonstrates understanding / competency    Patient understands monitoring blood glucose, interpreting and using results Demonstrates understanding / competency    Patient understands prevention, detection, and treatment of chronic complications. Needs Review    Patient understands how to develop strategies to address psychosocial issues. Needs Review    Patient understands how to develop strategies to promote health/change behavior. Needs Review      Outcomes   Expected Outcomes Demonstrated interest in learning. Expect positive outcomes    Future DMSE 3-4 months    Program Status Not Completed      Subsequent Visit   Since your last visit have you continued or begun to take your medications as prescribed? Not on Medications    Since your last visit have you experienced any weight changes? Loss             Individualized Plan for Diabetes Self-Management Training:   Learning Objective:  Patient  will have a greater understanding of diabetes self-management. Patient education plan is to attend individual and/or group sessions per assessed needs and concerns.   Plan:   Patient Instructions  Add a protein with each meal.  Fruit with yogurt or cottage cheese or a small handful of nuts Consider egg beaters or egg whites rather than whole eggs,  Saute vegetables and add the egg beaters to improve the flavor.  Consider a sugar free fudge sickle if needed for a treat.  Add beans to your vegetable meal in the evening.  Expected Outcomes:  Demonstrated interest in learning. Expect positive outcomes  Education material provided: Food label handouts and My Plate  If problems or questions, patient to contact team via:  Phone  Future DSME appointment: 3-4 months

## 2022-01-02 ENCOUNTER — Encounter: Payer: Self-pay | Admitting: Interventional Cardiology

## 2022-01-02 ENCOUNTER — Ambulatory Visit (INDEPENDENT_AMBULATORY_CARE_PROVIDER_SITE_OTHER): Payer: Medicare Other | Admitting: Interventional Cardiology

## 2022-01-02 VITALS — BP 126/60 | HR 60 | Ht 68.0 in | Wt 291.0 lb

## 2022-01-02 DIAGNOSIS — E782 Mixed hyperlipidemia: Secondary | ICD-10-CM

## 2022-01-02 DIAGNOSIS — I10 Essential (primary) hypertension: Secondary | ICD-10-CM

## 2022-01-02 DIAGNOSIS — R55 Syncope and collapse: Secondary | ICD-10-CM | POA: Diagnosis not present

## 2022-01-02 NOTE — Patient Instructions (Signed)
Medication Instructions:  Your physician recommends that you continue on your current medications as directed. Please refer to the Current Medication list given to you today.  *If you need a refill on your cardiac medications before your next appointment, please call your pharmacy*   Lab Work: none If you have labs (blood work) drawn today and your tests are completely normal, you will receive your results only by: [place] (if you have MyChart) OR A paper copy in the mail If you have any lab test that is abnormal or we need to change your treatment, we will call you to review the results.   Testing/Procedures: Dr Irish Lack recommends you have a Calcium Score CT Scan   Follow-Up: At [facility], you and your health needs are our priority.  As part of our continuing mission to provide you with exceptional heart care, we have created designated Provider Care Teams.  These Care Teams include your primary Cardiologist (physician) and Advanced Practice Providers (APPs -  Physician Assistants and Nurse Practitioners) who all work together to provide you with the care you need, when you need it.  We recommend signing up for the patient portal called "MyChart".  Sign up information is provided on this After Visit Summary.  MyChart is used to connect with patients for Virtual Visits (Telemedicine).  Patients are able to view lab/test results, encounter notes, upcoming appointments, etc.  Non-urgent messages can be sent to your provider as well.   To learn more about what you can do with MyChart, go to [email or web address].    Your next appointment:   As needed  The format for your next appointment:   In Person  Provider:   Larae Grooms, MD     Other Instructions    Important Information About Sugar

## 2022-01-02 NOTE — Progress Notes (Signed)
Cardiology Office Note   Date:  01/02/2022   ID:  Cheryl, [name] [birth year], MRN [id number]  PCP:  Ladell Pier, MD    No chief complaint on file.  syncope  Wt Readings from Last 3 Encounters:  01/02/22 291 lb (132 kg)  12/12/21 291 lb (132 kg)  10/10/21 292 lb 9.6 oz (132.7 kg)       History of Present Illness: Jaselynn FANCHON [place] is a 66 y.o. female who is being seen today for the evaluation of syncope at the request of Ladell Pier, MD.   She was in [place] in June 2023.  She was standing fixing a drink.  She did notice that something did not feel right, a sensation she has had before at a picnic.  She got assistance before falling.  THis time in [place], it was very hot and there was no one there to assist her.  She had more than the usual amount of caffeine.  She tries to stay hydrated in general, but since she was driving, she did not drink as [name].    Since June 2023, no further syncope.  She has had some fatigue, worse in hot weather.  Denies : Chest pain. Leg edema. Nitroglycerin use. Orthopnea. Palpitations. Paroxysmal nocturnal dyspnea. Shortness of breath.   She walks regularly, has had some DOE.        Past Medical History:  Diagnosis Date   Allergic rhinitis, cause unspecified    Arthritis    Asthma    Carpal tunnel syndrome    Cataract    Diabetes mellitus without complication (HCC)    diet- controlled   GERD (gastroesophageal reflux disease)    Glaucoma    Hypertension    Impaired fasting glucose    Morbid obesity (HCC)    OSA (obstructive sleep apnea) 12/02/2016   Other malaise and fatigue    Other specified cardiac dysrhythmias(427.89)    Pain in joint, site unspecified    Symptomatic menopausal or female climacteric states    Syncope and collapse     Past Surgical History:  Procedure Laterality Date   ABDOMINAL HYSTERECTOMY     1994   carpal tunnel both hands     2012   CLOSED MANIPULATION SHOULDER Left 04/2014   EYE SURGERY  Left 08/25/2015   EYE SURGERY Right 09/24/2015   JOINT REPLACEMENT     both knees replacement, 2010   mole removed from face     Nodule removed from back     SHOULDER SURGERY Left 11/2013   TONSILLECTOMY     as teenager     Current Outpatient Medications  Medication Sig Dispense Refill   acetaminophen (TYLENOL) 500 MG tablet Take 500 mg by mouth daily.     albuterol (VENTOLIN HFA) 108 (90 Base) MCG/ACT inhaler TAKE 2 PUFFS BY MOUTH EVERY 6 HOURS AS NEEDED FOR WHEEZE OR SHORTNESS OF BREATH 18 each 1   AMBULATORY NON FORMULARY MEDICATION Medication Name: Allergy Injection- every 4 weeks     aspirin 81 MG tablet Take 81 mg by mouth daily.     Azelastine HCl 137 MCG/SPRAY SOLN USE IN EACH NOSTRIL AS DIRECTED 90 mL 1   B-D ULTRA-FINE 33 LANCETS MISC Use to test blood sugar once daily. Dx: E11.9 100 each 6   budesonide-formoterol (SYMBICORT) 160-4.5 MCG/ACT inhaler 2 puffs twice daily 30.6 each 5   Calcium Carbonate-Vitamin D 600-400 MG-UNIT chew tablet Chew 1 tablet by mouth daily.  EPINEPHrine 0.3 mg/0.3 mL IJ SOAJ injection      fluticasone (FLONASE) 50 MCG/ACT nasal spray PLACE 1-2 SPRAYS INTO BOTH NOSTRILS DAILY. 48 mL 0   levocetirizine (XYZAL) 5 MG tablet Take 1 tablet (5 mg total) by mouth every evening. 30 tablet 0   lisinopril (ZESTRIL) 10 MG tablet TAKE 1 TABLET BY MOUTH EVERY DAY 30 tablet 1   meloxicam (MOBIC) 15 MG tablet Take 15 mg by mouth as needed.     Multiple Vitamin (MULTIVITAMIN ADULT PO) 1 tablet     nabumetone (RELAFEN) 500 MG tablet Take 500 mg by mouth 2 (two) times daily as needed.     omeprazole (PRILOSEC) 40 MG capsule TAKE 1 CAPSULE BY MOUTH EVERY DAY 90 capsule 0   Respiratory Therapy Supplies (CARETOUCH 2 CPAP HOSE HANGER) MISC      simvastatin (ZOCOR) 20 MG tablet Take one tablet by mouth once daily at bedtime for cholesterol 90 tablet 2   triamterene-hydrochlorothiazide (MAXZIDE) 75-50 MG tablet TAKE 1 TABLET BY MOUTH EVERY DAY 90 tablet 3   VALIUM 10  MG tablet 10 mg at bedtime.     vitamin C (ASCORBIC ACID) 500 MG tablet Take 500 mg by mouth every other day.     Blood Glucose Monitoring Suppl (PRODIGY AUTOCODE BLOOD GLUCOSE) DEVI by Does not apply route. Daily as directed (Patient not taking: Reported on 01/02/2022)     No current facility-administered medications for this visit.    Allergies:   Elemental sulfur and Oxycodone    Social History:  The patient  reports that she has never smoked. She has never used smokeless tobacco. She reports current alcohol use. She reports that she does not use drugs.   Family History:  The patient's family history includes Allergic rhinitis in her son and son; Cancer in her father; Diabetes in her brother, father, and paternal aunt; Lung cancer in her cousin.    ROS:  Please see the history of present illness.   Otherwise, review of systems are positive for syncope 2 months ago.   All other systems are reviewed and negative.    PHYSICAL EXAM: VS:  BP 126/60   Pulse 60   Ht '5\' 8"'$  (1.727 m)   Wt 291 lb (132 kg)   SpO2 97%   BMI 44.25 kg/m  , BMI Body mass index is 44.25 kg/m. GEN: Well nourished, well developed, in no acute distress HEENT: normal Neck: no JVD, carotid bruits, or masses Cardiac: RRR; no murmurs, rubs, or gallops,no edema  Respiratory:  clear to auscultation bilaterally, normal work of breathing GI: soft, nontender, nondistended, + BS, obese MS: no deformity or atrophy Skin: warm and dry, no rash Neuro:  Strength and sensation are intact Psych: euthymic mood, full affect   EKG:   The ekg ordered today demonstrates NSR, nonspecific ST changes   Recent Labs: 01/25/2021: ALT 14; Hemoglobin 13.2; Platelets 344 12/12/2021: BUN 14; Creatinine, Ser 0.97; Potassium 4.4; Sodium 143   Lipid Panel    Component Value Date/Time   CHOL 165 01/25/2021 1438   TRIG 148 01/25/2021 1438   HDL 51 01/25/2021 1438   CHOLHDL 3.2 01/25/2021 1438   CHOLHDL 3.0 07/23/2018 1045   VLDL 20  11/17/2016 1007   LDLCALC 88 01/25/2021 1438   LDLCALC 86 07/23/2018 1045     Other studies Reviewed: Additional studies/ records that were reviewed today with results demonstrating: .   ASSESSMENT AND PLAN:  Syncope: Likely in the setting of dehydration on a  warm day in [place].  I encouraged her to stay well-hydrated.  She tends to minimize caffeine use.  If she has a warning sign where she does not feel well, she immediately needs to sit down or try to lie down.  She should not stay on her feet.  Check records from [place].  She thinks she had an echocardiogram at that time.  Those records are not available to me at this time but she has signed a release for them per her report.  If no structural heart disease, would not plan any further cardiac testing. Hypertension: The current medical regimen is effective;  continue present plan and medications. Hypercholesterolemia: LDL 88 and September 2022, HDL 51.  On simvastatin.  We also discussed calcium scoring CT.Will plan for this test.   Current medicines are reviewed at length with the patient today.  The patient concerns regarding her medicines were addressed.  The following changes have been made:  No change  Labs/ tests ordered today include:  No orders of the defined types were placed in this encounter.   Recommend 150 minutes/week of aerobic exercise Low fat, low carb, high fiber diet recommended  Disposition:   FU as needed   Signed, Larae Grooms, MD  01/02/2022 2:50 PM    [facility] [address], [place], [place]  [address] Phone: [phone]; Fax: [phone]

## 2022-01-06 ENCOUNTER — Telehealth: Payer: Self-pay | Admitting: Emergency Medicine

## 2022-01-06 NOTE — Telephone Encounter (Signed)
Copied from [place] [id number]. Topic: Referral - Question >> Jan 06, 2022  3:24 PM Leilani Able wrote: Pt wants just a phone call of a good foot dr that Dr Lenna Sciara can recommend, she wants it to be within [facility] so Dr Lenna Sciara can see what is going on with her feet. She states this was discussed and a referral offered at her last HFU visit and pt did not take the referral. She states it does not have to be a quote referral she just wants a recommendation. FU with pt to advise [phone]

## 2022-01-07 NOTE — Telephone Encounter (Signed)
Deetta  This is not something I as a provider can give you.  I suggest partnering with [facility] on this matter  [facility] is who we use and takes most all insurances

## 2022-01-07 NOTE — Telephone Encounter (Signed)
Pt requesting info or list of Podiatry offices in network. Please advise.---DD,RMA

## 2022-01-10 NOTE — Telephone Encounter (Signed)
Are you able to assist w/ list?-----DD,RMA

## 2022-01-15 DIAGNOSIS — M71571 Other bursitis, not elsewhere classified, right ankle and foot: Secondary | ICD-10-CM | POA: Diagnosis not present

## 2022-01-15 DIAGNOSIS — M2041 Other hammer toe(s) (acquired), right foot: Secondary | ICD-10-CM | POA: Diagnosis not present

## 2022-01-15 NOTE — Telephone Encounter (Signed)
I called patient and left a message requesting a call back.    I want to inform her of Nora's list of options-   [facility] ,[facility] or [facility].    These are not recommendations, they are just a list of providers that our referral coordinator uses.

## 2022-01-16 NOTE — Telephone Encounter (Signed)
I tried to contact patient again today and had to leave a message requesting a call back.     I want to inform her of Nora's list of options:  [place]  [facility] - [facility]- [phone]     These are not recommendations, they are just a list of providers that our referral coordinator uses.

## 2022-01-21 ENCOUNTER — Ambulatory Visit (INDEPENDENT_AMBULATORY_CARE_PROVIDER_SITE_OTHER): Payer: Medicare Other | Admitting: *Deleted

## 2022-01-21 ENCOUNTER — Encounter: Payer: Self-pay | Admitting: Pharmacist

## 2022-01-21 ENCOUNTER — Ambulatory Visit: Payer: Medicare Other | Attending: Internal Medicine | Admitting: Pharmacist

## 2022-01-21 VITALS — Temp 98.6°F | Ht 68.0 in | Wt 277.8 lb

## 2022-01-21 DIAGNOSIS — J309 Allergic rhinitis, unspecified: Secondary | ICD-10-CM | POA: Diagnosis not present

## 2022-01-21 DIAGNOSIS — Z0000 Encounter for general adult medical examination without abnormal findings: Secondary | ICD-10-CM | POA: Diagnosis not present

## 2022-01-21 NOTE — Progress Notes (Signed)
Subjective:   Cheryl [name] is a 66 y.o. female who presents for Medicare Annual (Subsequent) preventive examination.    Objective:    Today's Vitals   01/21/22 1538 01/21/22 1542  Temp: 98.6 F (37 C)   Weight: 277 lb 12.8 oz (126 kg)   Height: '5\' 8"'$  (1.727 m)   PainSc: 0-No pain 0-No pain   Body mass index is 42.24 kg/m.     01/21/2022    3:51 PM 01/31/2021    5:00 PM 12/28/2018   10:08 AM 11/17/2018    1:19 PM 08/17/2018    9:13 AM 11/13/2017   10:02 AM 11/25/2016    8:13 PM  Advanced Directives  Does Patient Have a Medical Advance Directive? Yes Yes Yes Yes Yes Yes No  Type of Paramedic of [place];Living will Healthcare Power of [place];Living will   Does patient want to make changes to medical advance directive? No - Patient declined No - Patient declined  No - Patient declined No - Patient declined No - Patient declined   Copy of Power in Chart? No - copy requested   Yes - validated most recent copy scanned in chart (See row information) Yes - validated most recent copy scanned in chart (See row information) No - copy requested   Would patient like information on creating a medical advance directive?       No - Patient declined    Current Medications (verified) Outpatient Encounter Medications as of 01/21/2022  Medication Sig   acetaminophen (TYLENOL) 500 MG tablet Take 500 mg by mouth daily.   albuterol (VENTOLIN HFA) 108 (90 Base) MCG/ACT inhaler TAKE 2 PUFFS BY MOUTH EVERY 6 HOURS AS NEEDED FOR WHEEZE OR SHORTNESS OF BREATH   AMBULATORY NON FORMULARY MEDICATION Medication Name: Allergy Injection- every 4 weeks   aspirin 81 MG tablet Take 81 mg by mouth daily.   Azelastine HCl 137 MCG/SPRAY SOLN USE IN EACH NOSTRIL AS DIRECTED   B-D ULTRA-FINE 33 LANCETS MISC Use to test blood sugar once daily. Dx: E11.9   budesonide-formoterol (SYMBICORT) 160-4.5 MCG/ACT inhaler 2  puffs twice daily   Calcium Carbonate-Vitamin D 600-400 MG-UNIT chew tablet Chew 1 tablet by mouth daily.    EPINEPHrine 0.3 mg/0.3 mL IJ SOAJ injection    fluticasone (FLONASE) 50 MCG/ACT nasal spray PLACE 1-2 SPRAYS INTO BOTH NOSTRILS DAILY.   lisinopril (ZESTRIL) 10 MG tablet TAKE 1 TABLET BY MOUTH EVERY DAY   meloxicam (MOBIC) 15 MG tablet Take 15 mg by mouth as needed.   Multiple Vitamin (MULTIVITAMIN ADULT PO) 1 tablet   omeprazole (PRILOSEC) 40 MG capsule TAKE 1 CAPSULE BY MOUTH EVERY DAY   Respiratory Therapy Supplies (CARETOUCH 2 CPAP HOSE HANGER) MISC    simvastatin (ZOCOR) 20 MG tablet Take one tablet by mouth once daily at bedtime for cholesterol   triamterene-hydrochlorothiazide (MAXZIDE) 75-50 MG tablet TAKE 1 TABLET BY MOUTH EVERY DAY   vitamin C (ASCORBIC ACID) 500 MG tablet Take 500 mg by mouth every other day.   levocetirizine (XYZAL) 5 MG tablet Take 1 tablet (5 mg total) by mouth every evening.   nabumetone (RELAFEN) 500 MG tablet Take 500 mg by mouth 2 (two) times daily as needed.   [DISCONTINUED] Blood Glucose Monitoring Suppl (PRODIGY AUTOCODE BLOOD GLUCOSE) DEVI by Does not apply route. Daily as directed (Patient not taking: Reported on 01/02/2022)   [DISCONTINUED] VALIUM 10 MG tablet 10  mg at bedtime.   No facility-administered encounter medications on file as of 01/21/2022.    Allergies (verified) Misc. sulfonamide containing compounds, Elemental sulfur, and Oxycodone   History: Past Medical History:  Diagnosis Date   Allergic rhinitis, cause unspecified    Arthritis    Asthma    Carpal tunnel syndrome    Cataract    Diabetes mellitus without complication (HCC)    diet- controlled   GERD (gastroesophageal reflux disease)    Glaucoma    Hypertension    Impaired fasting glucose    Morbid obesity (HCC)    OSA (obstructive sleep apnea) 12/02/2016   Other malaise and fatigue    Other specified cardiac dysrhythmias(427.89)    Pain in joint, site unspecified     Symptomatic menopausal or female climacteric states    Syncope and collapse    Past Surgical History:  Procedure Laterality Date   ABDOMINAL HYSTERECTOMY     1994   carpal tunnel both hands     2012   CLOSED MANIPULATION SHOULDER Left 04/2014   EYE SURGERY Left 08/25/2015   EYE SURGERY Right 09/24/2015   JOINT REPLACEMENT     both knees replacement, 2010   mole removed from face     Nodule removed from back     SHOULDER SURGERY Left 11/2013   TONSILLECTOMY     as teenager   Family History  Problem Relation Age of Onset   Cancer Father    Diabetes Father    Allergic rhinitis Son    Diabetes Brother    Allergic rhinitis Son    Diabetes Paternal Aunt    Lung cancer Cousin    Asthma Neg Hx    Social History   Socioeconomic History   Marital status: Divorced    Spouse name: Not on file   Number of children: Not on file   Years of education: Not on file   Highest education level: Not on file  Occupational History   Not on file  Tobacco Use   Smoking status: Never   Smokeless tobacco: Never  Vaping Use   Vaping Use: Never used  Substance and Sexual Activity   Alcohol use: Yes    Comment: Seldom- Wine    Drug use: No   Sexual activity: Not Currently    Comment: post office worker  Other Topics Concern   Not on file  Social History Narrative   Not on file   Social Determinants of Health   Financial Resource Strain: Low Risk  (11/13/2017)   Overall Financial Resource Strain (CARDIA)    Difficulty of Paying Living Expenses: Not hard at all  Food Insecurity: No Food Insecurity (11/13/2017)   Hunger Vital Sign    Worried About Running Out of Food in the Last Year: Never true    Ran Out of Food in the Last Year: Never true  Transportation Needs: No Transportation Needs (11/13/2017)   PRAPARE - Hydrologist (Medical): No    Lack of Transportation (Non-Medical): No  Physical Activity: Inactive (11/13/2017)   Exercise Vital Sign     Days of Exercise per Week: 0 days    Minutes of Exercise per Session: 0 min  Stress: Stress Concern Present (11/13/2017)   [place]    Feeling of Stress : To some extent  Social Connections: Moderately Isolated (11/13/2017)   Social Connection and Isolation Panel [NHANES]    Frequency of Communication with  Friends and Family: More than three times a week    Frequency of Social Gatherings with Friends and Family: Never    Attends Religious Services: Never    Marine scientist or Organizations: No    Attends Music therapist: Never    Marital Status: Never married    Tobacco Counseling Counseling given: No   Clinical Intake:  Pre-visit preparation completed: No  Pain : No/denies pain Pain Score: 0-No pain     Nutritional Status: BMI > 30  Obese Diabetes: No (preDM) CBG done?: No CBG resulted in Enter/ Edit results?: No  How often do you need to have someone help you when you read instructions, pamphlets, or other written materials from your doctor or pharmacy?: 1 - Never  Diabetic? Yes  Interpreter Needed?: No      Activities of Daily Living    01/21/2022    3:55 PM  In your present state of health, do you have any difficulty performing the following activities:  Hearing? 0  Vision? 0  Difficulty concentrating or making decisions? 0  Walking or climbing stairs? 0  Dressing or bathing? 0  Doing errands, shopping? 0  Preparing Food and eating ? N  Using the Toilet? N  In the past six months, have you accidently leaked urine? N  Do you have problems with loss of bowel control? N  Managing your Medications? N  Managing your Finances? N  Housekeeping or managing your Housekeeping? N    Patient Care Team: Ladell Pier, MD as PCP - General (Internal Medicine) Jettie Booze, MD as PCP - Cardiology (Cardiology) [name], [place] (Optometry) Despina Hick, MD as  Consulting Physician (Ophthalmology) Ernst Bowler Gwenith Daily, MD as Consulting Physician (Allergy and Immunology) Ob/Gyn, [facility] (Obstetrics and Gynecology)  Indicate any recent Medical Services you may have received from other than [facility] providers in the past year (date may be approximate).     Assessment:   This is a routine wellness examination for Michie.  Hearing/Vision screen No results found.  Dietary issues and exercise activities discussed: Current Exercise Habits: Home exercise routine, Type of exercise: walking, Time (Minutes): 30, Frequency (Times/Week): 3, Weekly Exercise (Minutes/Week): 90, Exercise limited by: None identified   Goals Addressed   None   Depression Screen    01/21/2022    3:54 PM 12/12/2021    9:54 AM 09/27/2021    4:21 PM 08/29/2021    2:01 PM 05/28/2021    3:18 PM 01/31/2021    5:00 PM 01/25/2021    2:37 PM  PHQ 2/9 Scores  PHQ - 2 Score 0 2 4 0 4 0 1  PHQ- 9 Score  '9 13 9 11      '$ Fall Risk    01/21/2022    3:51 PM 12/12/2021    9:53 AM 09/27/2021    3:39 PM 08/29/2021    2:01 PM 05/28/2021    3:16 PM  Fall Risk   Falls in the past year? 1 0 0 0 0  Number falls in past yr: 0 0 0 0 0  Injury with Fall? 0 0 0 0 0  Risk for fall due to : History of fall(s) No Fall Risks   No Fall Risks  Follow up Falls evaluation completed;Education provided;Falls prevention discussed        FALL RISK PREVENTION PERTAINING TO THE HOME:  Any stairs in or around the home? No  If so, are there any without handrails? No  Home  free of loose throw rugs in walkways, pet beds, electrical cords, etc? Yes  Adequate lighting in your home to reduce risk of falls? Yes   ASSISTIVE DEVICES UTILIZED TO PREVENT FALLS:  Life alert? No  Use of a cane, walker or w/c? No  Grab bars in the bathroom? No  Shower chair or bench in shower? No  Elevated toilet seat or a handicapped toilet? No   TIMED UP AND GO:  Was the test performed? Yes .  Length of time to ambulate 10 feet: 5  sec.   Gait steady and fast without use of assistive device  Cognitive Function:    01/21/2022    3:56 PM  MMSE - Mini Mental State Exam  Orientation to time 5  Orientation to Place 5  Registration 3  Attention/ Calculation 5  Recall 2  Language- name 2 objects 2  Language- repeat 1  Language- follow 3 step command 3  Language- read & follow direction 1  Write a sentence 1  Copy design 1  Total score 29        11/17/2018    1:20 PM  6CIT Screen  What Year? 0 points  What month? 0 points  What time? 0 points  Count back from 20 0 points  Months in reverse 0 points  Repeat phrase 2 points  Total Score 2 points    Immunizations Immunization History  Administered Date(s) Administered   Influenza Split 02/07/2017   Influenza,inj,Quad PF,6+ Mos 02/08/2013, 02/23/2015, 02/29/2016, 01/25/2021   Influenza-Unspecified 01/24/2014, 02/12/2018, 02/07/2019   PFIZER(Purple Top)SARS-COV-2 Vaccination 07/02/2019, 07/26/2019, 02/24/2020, 09/28/2020, 09/23/2021   Pneumococcal Conjugate (Pcv15) 05/28/2021   Pneumococcal Polysaccharide-23 05/03/2013   Td 08/04/2011   Tdap 09/25/2021   Zoster Recombinat (Shingrix) 12/21/2016, 06/30/2017   Zoster, Live 05/26/2014, 02/23/2017    TDAP status: Up to date  Flu Vaccine status: Due, Education has been provided regarding the importance of this vaccine. Advised may receive this vaccine at local pharmacy or Health Dept. Aware to provide a copy of the vaccination record if obtained from local pharmacy or Health Dept. Verbalized acceptance and understanding.  Pneumococcal vaccine status: Up to date  Covid-19 vaccine status: Completed vaccines  Qualifies for Shingles Vaccine? No   Zostavax completed No   Shingrix Completed?: Yes - previously on 06/30/17, 12/21/2016  Screening Tests Health Maintenance  Topic Date Due   COVID-19 Vaccine (6 - Pfizer risk series) 11/18/2021   INFLUENZA VACCINE  12/24/2021   FOOT EXAM  01/25/2022    OPHTHALMOLOGY EXAM  02/25/2022   HEMOGLOBIN A1C  03/30/2022   MAMMOGRAM  12/17/2023   COLONOSCOPY (Pts 45-26yr Insurance coverage will need to be confirmed)  06/26/2030   TETANUS/TDAP  09/26/2031   Pneumonia Vaccine 66 Years old  Completed   DEXA SCAN  Completed   Zoster Vaccines- Shingrix  Completed   HPV VACCINES  Aged Out   Hepatitis C Screening  Discontinued    Health Maintenance  Health Maintenance Due  Topic Date Due   COVID-19 Vaccine (6 - Pfizer risk series) 11/18/2021   INFLUENZA VACCINE  12/24/2021    Colorectal cancer screening: Type of screening: Colonoscopy. Completed 06/26/2020. Repeat every 10 years  Mammogram status: Completed 12/16/2021. Repeat every year  Bone Density status: Completed 2019. Results reflect: Bone density results: NORMAL. Repeat every 2 years.  Lung Cancer Screening: (Low Dose CT Chest recommended if Age 68-80years, 30 pack-year currently smoking OR have quit w/in 15years.) does not qualify.   Lung Cancer Screening Referral:  not needed   Additional Screening:  Vision Screening: Recommended annual ophthalmology exams for early detection of glaucoma and other disorders of the eye. Is the patient up to date with their annual eye exam?  Yes  Who is the provider or what is the name of the office in which the patient attends annual eye exams? Despina Hick, MD as Consulting Physician (Ophthalmology)  Dental Screening: Recommended annual dental exams for proper oral hygiene  Community Resource Referral / Chronic Care Management: CRR required this visit?  No   CCM required this visit?  No      Plan:     I have personally reviewed and noted the following in the patient's chart:   Medical and social history Use of alcohol, tobacco or illicit drugs  Current medications and supplements including opioid prescriptions. Patient is not currently taking opioid prescriptions. Functional ability and status Nutritional status Physical  activity Advanced directives List of other physicians Hospitalizations, surgeries, and ER visits in previous 12 months Vitals Screenings to include cognitive, depression, and falls Referrals and appointments  In addition, I have reviewed and discussed with patient certain preventive protocols, quality metrics, and best practice recommendations. A written personalized care plan for preventive services as well as general preventive health recommendations were provided to patient.    Tresa Endo, RPH-CPP   01/21/2022

## 2022-01-22 ENCOUNTER — Encounter: Payer: Self-pay | Admitting: *Deleted

## 2022-01-22 NOTE — Telephone Encounter (Signed)
Mailed letter with information on it.

## 2022-01-28 ENCOUNTER — Ambulatory Visit: Payer: Medicare Other | Attending: Internal Medicine | Admitting: Internal Medicine

## 2022-01-28 ENCOUNTER — Encounter: Payer: Self-pay | Admitting: Internal Medicine

## 2022-01-28 DIAGNOSIS — G4733 Obstructive sleep apnea (adult) (pediatric): Secondary | ICD-10-CM | POA: Insufficient documentation

## 2022-01-28 DIAGNOSIS — E785 Hyperlipidemia, unspecified: Secondary | ICD-10-CM | POA: Insufficient documentation

## 2022-01-28 DIAGNOSIS — M25561 Pain in right knee: Secondary | ICD-10-CM | POA: Insufficient documentation

## 2022-01-28 DIAGNOSIS — E1159 Type 2 diabetes mellitus with other circulatory complications: Secondary | ICD-10-CM | POA: Diagnosis not present

## 2022-01-28 DIAGNOSIS — Z9989 Dependence on other enabling machines and devices: Secondary | ICD-10-CM

## 2022-01-28 DIAGNOSIS — I152 Hypertension secondary to endocrine disorders: Secondary | ICD-10-CM | POA: Diagnosis not present

## 2022-01-28 DIAGNOSIS — Z6841 Body Mass Index (BMI) 40.0 and over, adult: Secondary | ICD-10-CM | POA: Diagnosis not present

## 2022-01-28 DIAGNOSIS — Z2911 Encounter for prophylactic immunotherapy for respiratory syncytial virus (RSV): Secondary | ICD-10-CM

## 2022-01-28 DIAGNOSIS — Z23 Encounter for immunization: Secondary | ICD-10-CM | POA: Diagnosis not present

## 2022-01-28 DIAGNOSIS — E1169 Type 2 diabetes mellitus with other specified complication: Secondary | ICD-10-CM

## 2022-01-28 DIAGNOSIS — M25562 Pain in left knee: Secondary | ICD-10-CM | POA: Insufficient documentation

## 2022-01-28 MED ORDER — AREXVY 120 MCG/0.5ML IM SUSR: 0.5000 mL | Freq: Once | INTRAMUSCULAR | 0 refills | Status: AC

## 2022-01-28 NOTE — Progress Notes (Signed)
Patient ID: Cheryl [name], female    DOB: 12-20-1955  MRN: [id number]  CC: Hypertension   Subjective: Cheryl [name] is a 66 y.o. female who presents for chronic ds management Her concerns today include:  Pt with hx of DM type 2, HTN, HL, moderate persistent asthma, OSA on CPAP, morbid obesity, arthritis/LBP, chronic pelvic pain syndrome/pelvic floor dysfunction followed by urology Dr. Rocco Pauls some soreness in BL knees x 1 mth and some pain in lower back today. No swelling  or stiffness. Hx of TKA BL Plans to get back to walking; this helps. Took one Meloxicam today with good results.  On average she takes it once a mth  OSA:  got new CPAP machine.  Had the old one for 5 yrs and felt it was drying her out.  Feels refresh and rested when she wakes in mornings since having her new device.  HTN:  compliant with taking Maxide 75/50 mg and lisinopril 10 mg.  She limits salt in the foods.  No chest pains or shortness of breath.  She saw the cardiologist Dr. Illene Bolus since last visit with me.  His assessment was also that her syncope was likely due to dehydration.  Cardiac CT has been ordered.  Asthma:  doing well on Symbicort, Xyzal and Flonase.  Will have allergy testing next wk through her allergist Dr. Ernst Bowler.  DM:   Lab Results  Component Value Date   HGBA1C 6.0 09/27/2021    checks BS BID.  BS before BF 116-124  Trying to do better with eating habits. Drinks mainly water but also drinks sweet tea and zero calorie [facility] nutritionist last mth and found it helpful.  She is working on improving her eating habits and trying to eat less sweets. She is taking and tolerating Zocor for her cholesterol.  Last LDL was 88.  Patient Active Problem List   Diagnosis Date Noted   Hyperlipidemia associated with type 2 diabetes mellitus ([place]) 01/25/2021   Type 2 diabetes mellitus with morbid obesity ([place]) 01/25/2021   Moderate persistent asthma, uncomplicated 27/07/5007   OSA  (obstructive sleep apnea) 12/02/2016   Hyperlipidemia LDL goal <100 10/21/2014   Essential hypertension, benign 38/18/2993   DM w/o complication type II ([place]) 09/13/2014   Routine general medical examination at a health care facility 10/12/2013   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 05/03/2013   Need for prophylactic vaccination and inoculation against influenza 02/08/2013   GERD (gastroesophageal reflux disease) 02/08/2013   Other and unspecified hyperlipidemia 11/10/2012   Type II or unspecified type diabetes mellitus without mention of complication, not stated as uncontrolled 10/25/2012   Impaired fasting glucose 09/07/2012   Morbid obesity ([place]) 09/07/2012   Generalized osteoarthrosis, involving multiple sites 09/07/2012   Carpal tunnel syndrome 09/07/2012   Other, multiple, and unspecified sites, insect bite, nonvenomous, without mention of infection(919.4) 09/07/2012   Special screening for malignant neoplasms, colon 09/07/2012   Other specified cardiac dysrhythmias(427.89) 09/07/2012   Candidiasis of vulva and vagina 09/07/2012   Abdominal pain, generalized 09/07/2012   Symptomatic menopausal or female climacteric states 09/07/2012   Hypertension associated with diabetes ([place]) 09/07/2012   Perennial allergic rhinitis 09/07/2012   Pain in joint, site unspecified 09/07/2012   Other malaise and fatigue 09/07/2012     Current Outpatient Medications on File Prior to Visit  Medication Sig Dispense Refill   acetaminophen (TYLENOL) 500 MG tablet Take 500 mg by mouth daily.     albuterol (VENTOLIN  HFA) 108 (90 Base) MCG/ACT inhaler TAKE 2 PUFFS BY MOUTH EVERY 6 HOURS AS NEEDED FOR WHEEZE OR SHORTNESS OF BREATH 18 each 1   AMBULATORY NON FORMULARY MEDICATION Medication Name: Allergy Injection- every 4 weeks     aspirin 81 MG tablet Take 81 mg by mouth daily.     Azelastine HCl 137 MCG/SPRAY SOLN USE IN EACH NOSTRIL AS DIRECTED 90 mL 1   B-D ULTRA-FINE 33 LANCETS MISC Use to  test blood sugar once daily. Dx: E11.9 100 each 6   budesonide-formoterol (SYMBICORT) 160-4.5 MCG/ACT inhaler 2 puffs twice daily 30.6 each 5   Calcium Carbonate-Vitamin D 600-400 MG-UNIT chew tablet Chew 1 tablet by mouth daily.      EPINEPHrine 0.3 mg/0.3 mL IJ SOAJ injection      fluticasone (FLONASE) 50 MCG/ACT nasal spray PLACE 1-2 SPRAYS INTO BOTH NOSTRILS DAILY. 48 mL 0   levocetirizine (XYZAL) 5 MG tablet Take 1 tablet (5 mg total) by mouth every evening. 30 tablet 0   lisinopril (ZESTRIL) 10 MG tablet TAKE 1 TABLET BY MOUTH EVERY DAY 30 tablet 1   meloxicam (MOBIC) 15 MG tablet Take 15 mg by mouth as needed.     Multiple Vitamin (MULTIVITAMIN ADULT PO) 1 tablet     omeprazole (PRILOSEC) 40 MG capsule TAKE 1 CAPSULE BY MOUTH EVERY DAY 90 capsule 0   Respiratory Therapy Supplies (CARETOUCH 2 CPAP HOSE HANGER) MISC      simvastatin (ZOCOR) 20 MG tablet Take one tablet by mouth once daily at bedtime for cholesterol 90 tablet 2   triamterene-hydrochlorothiazide (MAXZIDE) 75-50 MG tablet TAKE 1 TABLET BY MOUTH EVERY DAY 90 tablet 3   vitamin C (ASCORBIC ACID) 500 MG tablet Take 500 mg by mouth every other day.     No current facility-administered medications on file prior to visit.    Allergies  Allergen Reactions   Misc. Sulfonamide Containing Compounds     Other reaction(s): Not available   Elemental Sulfur Diarrhea   Oxycodone     Had stomach and headache as side effect from medicine    Social History   Socioeconomic History   Marital status: Divorced    Spouse name: Not on file   Number of children: Not on file   Years of education: Not on file   Highest education level: Not on file  Occupational History   Not on file  Tobacco Use   Smoking status: Never   Smokeless tobacco: Never  Vaping Use   Vaping Use: Never used  Substance and Sexual Activity   Alcohol use: Yes    Comment: Seldom- Wine    Drug use: No   Sexual activity: Not Currently    Comment: post office  worker  Other Topics Concern   Not on file  Social History Narrative   Not on file   Social Determinants of Health   Financial Resource Strain: Low Risk  (11/13/2017)   Overall Financial Resource Strain (CARDIA)    Difficulty of Paying Living Expenses: Not hard at all  Food Insecurity: No Food Insecurity (11/13/2017)   Hunger Vital Sign    Worried About Running Out of Food in the Last Year: Never true    [place] in the Last Year: Never true  Transportation Needs: No Transportation Needs (11/13/2017)   PRAPARE - Hydrologist (Medical): No    Lack of Transportation (Non-Medical): No  Physical Activity: Inactive (11/13/2017)   Exercise Vital Sign  Days of Exercise per Week: 0 days    Minutes of Exercise per Session: 0 min  Stress: Stress Concern Present (11/13/2017)   Ke[place]    Feeling of Stress : To some extent  Social Connections: Moderately Isolated (11/13/2017)   Social Connection and Isolation Panel [NHANES]    Frequency of Communication with Friends and Family: More than three times a week    Frequency of Social Gatherings with Friends and Family: Never    Attends Religious Services: Never    Marine scientist or Organizations: No    Attends Archivist Meetings: Never    Marital Status: Never married  Intimate Partner Violence: Not At Risk (11/13/2017)   Humiliation, Afraid, Rape, and Kick questionnaire    Fear of Current or Ex-Partner: No    Emotionally Abused: No    Physically Abused: No    Sexually Abused: No    Family History  Problem Relation Age of Onset   Cancer Father    Diabetes Father    Allergic rhinitis Son    Diabetes Brother    Allergic rhinitis Son    Diabetes Paternal Aunt    Lung cancer Cousin    Asthma Neg Hx     Past Surgical History:  Procedure Laterality Date   ABDOMINAL HYSTERECTOMY     1994   carpal tunnel both hands      2012   CLOSED MANIPULATION SHOULDER Left 04/2014   EYE SURGERY Left 08/25/2015   EYE SURGERY Right 09/24/2015   JOINT REPLACEMENT     both knees replacement, 2010   mole removed from face     Nodule removed from back     SHOULDER SURGERY Left 11/2013   TONSILLECTOMY     as teenager    ROS: Review of Systems Negative except as stated above  PHYSICAL EXAM: BP 113/73   Pulse 65   Ht '5\' 8"'$  (1.727 m)   Wt 289 lb 6.4 oz (131.3 kg)   SpO2 99%   BMI 44.00 kg/m   Wt Readings from Last 3 Encounters:  01/28/22 289 lb 6.4 oz (131.3 kg)  01/21/22 277 lb 12.8 oz (126 kg)  01/02/22 291 lb (132 kg)    Physical Exam  General appearance - alert, well appearing, obese older African-American female and in no distress Mental status - normal mood, behavior, speech, dress, motor activity, and thought processes Neck - supple, no significant adenopathy Chest - clear to auscultation, no wheezes, rales or rhonchi, symmetric air entry Heart - normal rate, regular rhythm, normal S1, S2, no murmurs, rubs, clicks or gallops Extremities - peripheral pulses normal, no pedal edema, no clubbing or cyanosis MSK: Knees: Large body habitus.  No point tenderness.  She has good range of motion bilaterally.     Latest Ref Rng & Units 12/12/2021   10:41 AM 01/25/2021    2:38 PM 08/17/2018    9:34 AM  CMP  Glucose 70 - 99 mg/dL 107  92  101   BUN 8 - 27 mg/dL '14  12  9   '$ Creatinine 0.57 - 1.00 mg/dL 0.97  0.97  0.83   Sodium 134 - 144 mmol/L 143  141  142   Potassium 3.5 - 5.2 mmol/L 4.4  4.4  3.8   Chloride 96 - 106 mmol/L 101  100  103   CO2 20 - 29 mmol/L '24  24  31   '$ Calcium 8.7 - 10.3  mg/dL 10.1  10.2  9.9   Total Protein 6.0 - 8.5 g/dL  7.5    Total Bilirubin 0.0 - 1.2 mg/dL  0.3    Alkaline Phos 44 - 121 IU/L  68    AST 0 - 40 IU/L  24    ALT 0 - 32 IU/L  14     Lipid Panel     Component Value Date/Time   CHOL 165 01/25/2021 1438   TRIG 148 01/25/2021 1438   HDL 51 01/25/2021 1438    CHOLHDL 3.2 01/25/2021 1438   CHOLHDL 3.0 07/23/2018 1045   VLDL 20 11/17/2016 1007   LDLCALC 88 01/25/2021 1438   LDLCALC 86 07/23/2018 1045    CBC    Component Value Date/Time   WBC 8.2 01/25/2021 1438   WBC 9.4 11/13/2017 1025   RBC 4.85 01/25/2021 1438   RBC 4.80 11/13/2017 1025   HGB 13.2 01/25/2021 1438   HCT 39.8 01/25/2021 1438   PLT 344 01/25/2021 1438   MCV 82 01/25/2021 1438   MCH 27.2 01/25/2021 1438   MCH 26.9 (L) 11/13/2017 1025   MCHC 33.2 01/25/2021 1438   MCHC 33.3 11/13/2017 1025   RDW 14.5 01/25/2021 1438   LYMPHSABS 2.3 07/29/2018 1443   MONOABS 576 11/17/2016 1007   EOSABS 0.2 07/29/2018 1443   BASOSABS 0.0 07/29/2018 1443    ASSESSMENT AND PLAN: 1. Type 2 diabetes mellitus with morbid obesity ([place]) Reported blood sugars are at goal.  Encourage healthy eating habits.  Advised her to avoid temptation of purchasing and keeping sweets in the house. Encouraged her to move as much as she can.  She plans to start walking again. - CBC - Microalbumin / creatinine urine ratio  2. Hypertension associated with diabetes ([place]) At goal.  Continue current dose of Maxide and lisinopril.  3. OSA on CPAP Doing well with her new CPAP.  Encouraged her to continue using it every night.  4. Acute pain of both knees Continue meloxicam as needed.  If knees become more bothersome, we will need to refer her to orthopedics given her history of having had knee replacement surgeries.  5. Hyperlipidemia associated with type 2 diabetes mellitus ([place]) Continue Zocor - Lipid panel - Hepatic Function Panel  6. Need for RSV immunization We discussed vaccination for RSV.  Patient is interested in receiving the vaccine.  I have printed the prescription and given it to her to take to our pharmacy to get the vaccine - RSV vaccine recomb adjuvanted (AREXVY) 120 MCG/0.5ML injection; Inject 0.5 mLs into the muscle once for 1 dose.  Dispense: 0.5 mL; Refill: 0  7. Need for  immunization against influenza - Flu Vaccine QUAD 62moIM (Fluarix, Fluzone & Alfiuria Quad PF)   Patient was given the opportunity to ask questions.  Patient verbalized understanding of the plan and was able to repeat key elements of the plan.   This documentation was completed using DRadio producer  Any transcriptional errors are unintentional.  Orders Placed This Encounter  Procedures   Flu Vaccine QUAD 661moM (Fluarix, Fluzone & Alfiuria Quad PF)   Lipid panel   CBC   Microalbumin / creatinine urine ratio   Hepatic Function Panel     Requested Prescriptions   Signed Prescriptions Disp Refills   RSV vaccine recomb adjuvanted (AREXVY) 120 MCG/0.5ML injection 0.5 mL 0    Sig: Inject 0.5 mLs into the muscle once for 1 dose.    Return in about 4 months (around  05/30/2022).  Karle Plumber, MD, FACP

## 2022-01-29 ENCOUNTER — Encounter: Payer: Self-pay | Admitting: Internal Medicine

## 2022-01-29 LAB — HEPATIC FUNCTION PANEL
ALT: 14 IU/L (ref 0–32)
AST: 19 IU/L (ref 0–40)
Albumin: 5.1 g/dL — ABNORMAL HIGH (ref 3.9–4.9)
Alkaline Phosphatase: 69 IU/L (ref 44–121)
Bilirubin Total: <0.2 mg/dL (ref 0.0–1.2)
Bilirubin, Direct: <0.1 mg/dL (ref 0.00–0.40)
Total Protein: 7.5 g/dL (ref 6.0–8.5)

## 2022-01-29 LAB — LIPID PANEL
Chol/HDL Ratio: 3.2 ratio (ref 0.0–4.4)
Cholesterol, Total: 167 mg/dL (ref 100–199)
HDL: 53 mg/dL (ref >39)
LDL Chol Calc (NIH): 84 mg/dL (ref 0–99)
Triglycerides: 175 mg/dL — ABNORMAL HIGH (ref 0–149)
VLDL Cholesterol Cal: 30 mg/dL (ref 5–40)

## 2022-01-29 LAB — CBC
Hematocrit: 41.1 % (ref 34.0–46.6)
Hemoglobin: 13.7 g/dL (ref 11.1–15.9)
MCH: 27.7 pg (ref 26.6–33.0)
MCHC: 33.3 g/dL (ref 31.5–35.7)
MCV: 83 fL (ref 79–97)
Platelets: 354 10*3/uL (ref 150–450)
RBC: 4.94 x10E6/uL (ref 3.77–5.28)
RDW: 14.6 % (ref 11.7–15.4)
WBC: 8.6 10*3/uL (ref 3.4–10.8)

## 2022-01-29 LAB — MICROALBUMIN / CREATININE URINE RATIO
Creatinine, Urine: 101.2 mg/dL
Microalb/Creat Ratio: 4 mg/g creat (ref 0–29)
Microalbumin, Urine: 3.9 ug/mL

## 2022-01-30 ENCOUNTER — Ambulatory Visit (HOSPITAL_BASED_OUTPATIENT_CLINIC_OR_DEPARTMENT_OTHER)
Admission: RE | Admit: 2022-01-30 | Discharge: 2022-01-30 | Disposition: A | Discharge status: Home / Self Care | Payer: Medicare Other | Source: Ambulatory Visit | Attending: Interventional Cardiology | Admitting: Interventional Cardiology

## 2022-01-30 DIAGNOSIS — R55 Syncope and collapse: Secondary | ICD-10-CM | POA: Insufficient documentation

## 2022-01-30 DIAGNOSIS — I10 Essential (primary) hypertension: Secondary | ICD-10-CM | POA: Insufficient documentation

## 2022-01-31 ENCOUNTER — Ambulatory Visit (INDEPENDENT_AMBULATORY_CARE_PROVIDER_SITE_OTHER): Payer: Medicare Other

## 2022-01-31 DIAGNOSIS — J309 Allergic rhinitis, unspecified: Secondary | ICD-10-CM

## 2022-02-04 ENCOUNTER — Ambulatory Visit (INDEPENDENT_AMBULATORY_CARE_PROVIDER_SITE_OTHER): Payer: Medicare Other | Admitting: *Deleted

## 2022-02-04 DIAGNOSIS — J309 Allergic rhinitis, unspecified: Secondary | ICD-10-CM | POA: Diagnosis not present

## 2022-02-11 ENCOUNTER — Ambulatory Visit (INDEPENDENT_AMBULATORY_CARE_PROVIDER_SITE_OTHER): Payer: Medicare Other

## 2022-02-11 DIAGNOSIS — J309 Allergic rhinitis, unspecified: Secondary | ICD-10-CM

## 2022-02-18 ENCOUNTER — Ambulatory Visit (INDEPENDENT_AMBULATORY_CARE_PROVIDER_SITE_OTHER): Payer: Medicare Other | Admitting: Allergy & Immunology

## 2022-02-18 ENCOUNTER — Encounter: Payer: Self-pay | Admitting: Allergy & Immunology

## 2022-02-18 ENCOUNTER — Other Ambulatory Visit: Payer: Self-pay

## 2022-02-18 VITALS — BP 122/66 | HR 70 | Resp 18

## 2022-02-18 DIAGNOSIS — J309 Allergic rhinitis, unspecified: Secondary | ICD-10-CM

## 2022-02-18 DIAGNOSIS — R55 Syncope and collapse: Secondary | ICD-10-CM | POA: Diagnosis not present

## 2022-02-18 DIAGNOSIS — J4540 Moderate persistent asthma, uncomplicated: Secondary | ICD-10-CM | POA: Diagnosis not present

## 2022-02-18 MED ORDER — EPINEPHRINE 0.3 MG/0.3ML IJ SOAJ: 0.3000 mg | INTRAMUSCULAR | 1 refills | Status: DC | PRN

## 2022-02-18 NOTE — Progress Notes (Signed)
FOLLOW UP  Date of Service/Encounter:  02/18/22   Assessment:   Moderate persistent asthma - did not do spirometry today per patient request   Perennial allergic rhinitis (dust mites, cockroach, ragweed) - on allergen immunotherapy with maintenance reached August 2020   Complex medical history   Fully vaccinated for [facility], including the latest bivalent booster  Recently experienced fainting episode - unclear etiology  Plan/Recommendations:   1. Moderate persistent asthma, uncomplicated - Lung function looks excellent today. - We are not going to make any changes today. - You are doing well.  - Daily controller medication(s): Symbicort 160/4.58mg two puffs twice daily with spacer - Prior to physical activity: albuterol 2 puffs 10-15 minutes before physical activity. - Rescue medications: albuterol 4 puffs every 4-6 hours as needed - Asthma control goals:  * Full participation in all desired activities (may need albuterol before activity) * Albuterol use two time or less a week on average (not counting use with activity) * Cough interfering with sleep two time or less a month * Oral steroids no more than once a year * No hospitalizations  2. Perennial allergic rhinitis (dust mites, cockroach) - Continue with allergy shots at the same schedule (every two weeks).  - Continue with cetirizine 10 mg daily. - Continue with Flonase 1 to 2 sprays per nostril ONCE DAILY.  - Continue with Astelin 1 to 2 sprays per nostril ONCE DAILY.   3. Return in about 6 months (around 08/19/2022).   Subjective:   Cheryl CPRESTON WEILLis a 66y.o. female presenting today for follow up of  Chief Complaint  Patient presents with   Allergic Rhinitis     Cheryl C MNoldenhas a history of the following: Patient Active Problem List   Diagnosis Date Noted   Hyperlipidemia associated with type 2 diabetes mellitus ([place] 01/25/2021   Type 2 diabetes mellitus with morbid obesity ([place] 01/25/2021   Moderate  persistent asthma, uncomplicated 079/39/0300  OSA (obstructive sleep apnea) 12/02/2016   Hyperlipidemia LDL goal <100 10/21/2014   Essential hypertension, benign 092/33/0076  DM w/o complication type II ([place] 09/13/2014   Routine general medical examination at a health care facility 10/12/2013   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 05/03/2013   Need for prophylactic vaccination and inoculation against influenza 02/08/2013   GERD (gastroesophageal reflux disease) 02/08/2013   Other and unspecified hyperlipidemia 11/10/2012   Type II or unspecified type diabetes mellitus without mention of complication, not stated as uncontrolled 10/25/2012   Impaired fasting glucose 09/07/2012   Morbid obesity ([place] 09/07/2012   Generalized osteoarthrosis, involving multiple sites 09/07/2012   Carpal tunnel syndrome 09/07/2012   Other, multiple, and unspecified sites, insect bite, nonvenomous, without mention of infection(919.4) 09/07/2012   Special screening for malignant neoplasms, colon 09/07/2012   Other specified cardiac dysrhythmias(427.89) 09/07/2012   Candidiasis of vulva and vagina 09/07/2012   Abdominal pain, generalized 09/07/2012   Symptomatic menopausal or female climacteric states 09/07/2012   Hypertension associated with diabetes ([place] 09/07/2012   Perennial allergic rhinitis 09/07/2012   Pain in joint, site unspecified 09/07/2012   Other malaise and fatigue 09/07/2012    History obtained from: chart review and patient.  Cheryl [name] is a 66y.o. female presenting for a follow up visit.  Cheryl [name] was last seen in March 2023.  At that time, her lung function looked excellent.  We continue with Symbicort 160 mcg 2 puffs twice daily as well as albuterol as needed.  For her rhinitis,  we continue with allergy shots as well as cetirizine, Flonase, and Astelin.  In the interim, Cheryl [name] had a black out episode in [place] when Cheryl [name] was visiting a friend there. Cheryl [name] had just gotten there and Cheryl [name] stayed  overnight. The following morning, Cheryl [name] passed out and hit the ground. Her friend is a retired Marine scientist and Cheryl [name] helped to revive. Ambulance was called. Cheryl [name] stayed overnight in the hospital. Cheryl [name] has since had an extensive workup that has been unremarkable. Cheryl [name] is no longer driving at all per her children's orders.   Asthma/Respiratory Symptom History: Asthma is under good control with the current regimen. Cheryl [name] is tired sometimes. But otherwise Cheryl [name] is doing fine. Cheryl [name] rests and Cheryl [name] gets up and is fine.  Cheryl [name] is not paying much for her medications at this point in time.   Allergic Rhinitis Symptom History: Shots are going well.  Shots seems to be working very well. Cheryl [name] was able to cut back on the Flonase; Cheryl [name] is using this once daily instead of twice daily. Cheryl [name] is doing the Astelin once daily.   Cheryl [name] is on allergen immunotherapy. Cheryl [name] receives one injection. Immunotherapy script #1 contains ragweed, dust mites and cockroach. Cheryl [name] currently receives 0.44m of the RED vial (1/100). Cheryl [name] started shots March of 2020 and reached maintenance in August of 2020. Cheryl [name] has had no large local reactions. Cheryl [name] is getting shots every 4 weeks. Cheryl [name] is doing very well with her allergy shots and not having any large local reactions.  Cheryl [name] has a 170yoand a 2yo grandchildren.  The 253year-old has been having some issues with some enlarged tonsils.  Cheryl [name] also had tubes placed recently.  [place]  [place].  [place]  Otherwise, there have been no changes to her past medical history, surgical history, family history, or social history.    Review of Systems  Constitutional: Negative.  Negative for chills, fever, malaise/fatigue and weight loss.  HENT: Negative.  Negative for congestion, ear discharge, ear pain and sinus pain.   Eyes:  Negative for pain, discharge and redness.  Respiratory:  Negative for cough, sputum  production, shortness of breath, wheezing and stridor.   Cardiovascular: Negative.  Negative for chest pain and palpitations.  Gastrointestinal:  Negative for abdominal pain, constipation, diarrhea, heartburn, nausea and vomiting.  Skin: Negative.  Negative for itching and rash.  Neurological:  Negative for dizziness and headaches.  Endo/Heme/Allergies:  Negative for environmental allergies. Does not bruise/bleed easily.       Objective:   Blood pressure 122/66, pulse 70, resp. rate 18, SpO2 99 %. There is no height or weight on file to calculate BMI.    Physical Exam Vitals reviewed.  Constitutional:      Appearance: Cheryl [name] is well-developed.  HENT:     Head: Normocephalic and atraumatic.     Right Ear: Tympanic membrane, ear canal and external ear normal.     Left Ear: Tympanic membrane, ear canal and external ear normal.     Nose: No nasal deformity, septal deviation, mucosal edema or rhinorrhea.     Right Turbinates: Enlarged. Not swollen.     Left Turbinates: Enlarged. Not swollen.     Right Sinus: No maxillary sinus tenderness or frontal sinus tenderness.     Left Sinus: No maxillary sinus tenderness or frontal sinus tenderness.     Mouth/Throat:  Mouth: Mucous membranes are not pale and not dry.     Pharynx: Uvula midline.  Eyes:     General: Lids are normal. No allergic shiner.       Right eye: No discharge.        Left eye: No discharge.     Conjunctiva/sclera: Conjunctivae normal.     Right eye: Right conjunctiva is not injected. No chemosis.    Left eye: Left conjunctiva is not injected. No chemosis.    Pupils: Pupils are equal, round, and reactive to light.  Cardiovascular:     Rate and Rhythm: Normal rate and regular rhythm.     Heart sounds: Normal heart sounds.  Pulmonary:     Effort: Pulmonary effort is normal. No tachypnea, accessory muscle usage or respiratory distress.     Breath sounds: Normal breath sounds. No wheezing, rhonchi or rales.  Chest:      Chest wall: No tenderness.  Lymphadenopathy:     Cervical: No cervical adenopathy.  Skin:    Coloration: Skin is not pale.     Findings: No abrasion, erythema, petechiae or rash. Rash is not papular, urticarial or vesicular.  Neurological:     Mental Status: Cheryl [name] is alert.  Psychiatric:        Behavior: Behavior is cooperative.      Diagnostic studies:    Spirometry: results abnormal (FEV1: 1.56/71%, FVC: 1.96/69%, FEV1/FVC: 80%).    Spirometry consistent with possible restrictive disease.   Allergy Studies: none        Salvatore Marvel, MD  [facility]

## 2022-02-18 NOTE — Patient Instructions (Addendum)
1. Moderate persistent asthma, uncomplicated - Lung function looks excellent today. - We are not going to make any changes today. - You are doing well.  - Daily controller medication(s): Symbicort 160/4.66mg two puffs twice daily with spacer - Prior to physical activity: albuterol 2 puffs 10-15 minutes before physical activity. - Rescue medications: albuterol 4 puffs every 4-6 hours as needed - Asthma control goals:  * Full participation in all desired activities (may need albuterol before activity) * Albuterol use two time or less a week on average (not counting use with activity) * Cough interfering with sleep two time or less a month * Oral steroids no more than once a year * No hospitalizations  2. Perennial allergic rhinitis (dust mites, cockroach) - Continue with allergy shots at the same schedule (every two weeks).  - Continue with cetirizine 10 mg daily. - Continue with Flonase 1 to 2 sprays per nostril ONCE DAILY.  - Continue with Astelin 1 to 2 sprays per nostril ONCE DAILY.   3. Return in about 6 months (around 08/19/2022).    Please inform u[place]of any Emergency Department visits, hospitalizations, or changes in symptoms. Call uKoreabefore going to the ED for breathing or allergy symptoms since we might be able to fit you in for a sick visit. Feel free to contact u[place]oreaanytime with any questions, problems, or concerns.  It was a pleasure to see you again today!  Websites that have reliable patient information: 1. American Academy of Asthma, Allergy, and Immunology: www.aaaai.org 2. Food Allergy Research and Education (FARE): foodallergy.org 3. Mothers of Asthmatics: http://www.asthmacommunitynetwork.org 4. American College of Allergy, Asthma, and Immunology: www.acaai.org   COVID-19 Vaccine Information can be found at: [email or web address] questions related to vaccine distribution or appointments, please email  [email or web address] or call [phone]   We realize that you might be concerned about having an allergic reaction to the COVID19 vaccines. To help with that concern, WE ARE OFFERING THE COVID19 VACCINES IN OUR OFFICE! Ask the front desk for dates!     "Like" u[place]on Facebook and Instagram for our latest updates!      A healthy democracy works best when ANew York Life Insuranceparticipate! Make sure you are registered to vote! If you have moved or changed any of your contact information, you will need to get this updated before voting!  In some cases, you MAY be able to register to vote online: [email or web address]

## 2022-02-19 NOTE — Progress Notes (Signed)
Cheryl [name]    [id number]    1956/04/07  [facility], Brayton Layman, DO  HPI female never smoker followed for OSA, complicated by asthma( Dr Vaughan Browner), DM 2, HBP, GERD, allergic rhinitis/allergy vaccine (Dr Donneta Romberg), glaucoma NPSG- 11/25/16- AHI 40.3/hour, desaturation to 88%, body weight 286 pounds  ----------------------------------------------------------------  10/10/21-  66 year old female never smoker(son is Industrial/product designer) followed for OSA, complicated by asthma( Dr Vaughan Browner), DM 2, HBP, GERD, Allergic Rhinitis/Allergy Vaccine   CPAP auto 5-15/ [employer]            AirSense 10 AutoSet Download-compliance 97%, AHI 3.4/ hr Body weight today-292 lbs Covid vax- 5 Phizer Flu vax- had -----Patient feels like she is doing good, no concerns She is comfortable with her CPAP and feels that she sleeps well, life better with it than without it.  Download reviewed with her.  Breathing is comfortable.  Machine is about old enough to replace. Walking is somewhat limited-blamed on pollen which she hopes will be getting better in the next few weeks.  02/20/22-66 year old female never smoker(son is Industrial/product designer) followed for OSA, complicated by Asthma( Dr Vaughan Browner), DM 2, HBP, GERD, Allergic Rhinitis/Allergy Vaccine   CPAP auto 5-15/ [employer]            AirSense 10 AutoSet Download-compliance 100%, AHI 5.6/ hr Body weight today-294 lbs Covid vax- 5 Phizer Flu vax- had Reviewed.  Sleeps well with CPAP.  Got replacement machine 1 month ago.  No concerns with this. Had a syncopal episode while visiting in [place] recently.  Not clear what happened and evaluation there apparently was negative.  ROS-see HPI   + = positive Constitutional:    weight loss, night sweats, fevers, chills, fatigue, lassitude. HEENT:    headaches, difficulty swallowing, tooth/dental problems, sore throat,       sneezing, itching, ear ache, nasal congestion, post nasal drip, snoring CV:    chest pain, orthopnea, PND,  swelling in lower extremities, anasarca,                                            dizziness, palpitations Resp:   shortness of breath with exertion or at rest.                productive cough,   non-productive cough, coughing up of blood.              change in color of mucus.  wheezing.   Skin:    rash or lesions. GI:  No-   heartburn, indigestion, abdominal pain, nausea, vomiting, diarrhea,                 change in bowel habits, loss of appetite GU: dysuria, change in color of urine, no urgency or frequency.   flank pain. MS:   joint pain, stiffness, decreased range of motion, back pain. Neuro-    +syncopal episode Psych:  change in mood or affect.  depression or anxiety.   memory loss.  OBJ- Physical Exam General- Alert, Oriented, Affect-appropriate, Distress- none acute, + morbidly obese Skin- rash-none, lesions- none, excoriation- none Lymphadenopathy- none Head- atraumatic            Eyes- Gross vision intact, PERRLA, conjunctivae and secretions clear            Ears- Hearing, canals-normal  Nose- Clear, no-Septal dev, mucus, polyps, erosion, perforation             Throat- Mallampati IV , mucosa clear , drainage- none, tonsils- atrophic, + own teeth Neck- flexible , trachea midline, no stridor , thyroid nl, carotid no bruit Chest - symmetrical excursion , unlabored           Heart/CV- RRR , +1/6S murmur , no gallop  , no rub, nl s1 s2                           - JVD- none , edema- none, stasis changes- none, varices- none           Lung- clear to P&A, wheeze- none, cough- none , dullness-none, rub- none           Chest wall-  Abd-  Br/ Gen/ Rectal- Not done, not indicated Extrem- cyanosis- none, clubbing, none, atrophy- none, strength- nl Neuro- grossly intact to observation

## 2022-02-20 ENCOUNTER — Encounter: Payer: Self-pay | Admitting: Internal Medicine

## 2022-02-20 ENCOUNTER — Ambulatory Visit (INDEPENDENT_AMBULATORY_CARE_PROVIDER_SITE_OTHER): Payer: Medicare Other | Admitting: Internal Medicine

## 2022-02-20 ENCOUNTER — Encounter: Payer: Self-pay | Admitting: Allergy & Immunology

## 2022-02-20 DIAGNOSIS — G4733 Obstructive sleep apnea (adult) (pediatric): Secondary | ICD-10-CM | POA: Diagnosis not present

## 2022-02-20 NOTE — Patient Instructions (Signed)
Glad you are doing well. We can continue CPAP auto 5-15  Please call if we can help. Glad you are walking.

## 2022-02-21 NOTE — Assessment & Plan Note (Signed)
Continue efforts with diet/exercise

## 2022-02-21 NOTE — Assessment & Plan Note (Signed)
Benefits from CPAP with good compliance and control Plan- continue auto 5-15 

## 2022-02-25 ENCOUNTER — Encounter: Payer: Medicare Other | Admitting: Allergy & Immunology

## 2022-02-25 ENCOUNTER — Encounter: Payer: Self-pay | Admitting: Allergy & Immunology

## 2022-02-25 NOTE — Progress Notes (Signed)
This encounter was created in error - please disregard.

## 2022-02-28 ENCOUNTER — Other Ambulatory Visit: Payer: Self-pay | Admitting: Allergy & Immunology

## 2022-03-03 ENCOUNTER — Other Ambulatory Visit: Payer: Self-pay | Admitting: Internal Medicine

## 2022-03-03 DIAGNOSIS — E119 Type 2 diabetes mellitus without complications: Secondary | ICD-10-CM

## 2022-03-03 DIAGNOSIS — I10 Essential (primary) hypertension: Secondary | ICD-10-CM

## 2022-03-03 NOTE — Telephone Encounter (Signed)
Medication Refill - Medication: lisinopril (ZESTRIL) 10 MG tablet  Has the patient contacted their pharmacy? Yes.   (Pt states this was filled last by her previous provider, Sherrie Mustache. Preferred Pharmacy (with phone number or street name): [id number] [place] Has the patient been seen for an appointment in the last year OR does the patient have an upcoming appointment? Yes.    Agent: Please be advised that RX refills may take up to 3 business days. We ask that you follow-up with your pharmacy.

## 2022-03-04 ENCOUNTER — Ambulatory Visit (INDEPENDENT_AMBULATORY_CARE_PROVIDER_SITE_OTHER): Payer: Medicare Other | Admitting: Allergy & Immunology

## 2022-03-04 ENCOUNTER — Encounter: Payer: Self-pay | Admitting: Allergy & Immunology

## 2022-03-04 VITALS — BP 128/68 | HR 67 | Temp 98.1°F | Resp 16 | Ht 68.0 in | Wt 295.2 lb

## 2022-03-04 DIAGNOSIS — J302 Other seasonal allergic rhinitis: Secondary | ICD-10-CM

## 2022-03-04 DIAGNOSIS — J3089 Other allergic rhinitis: Secondary | ICD-10-CM

## 2022-03-04 DIAGNOSIS — J4540 Moderate persistent asthma, uncomplicated: Secondary | ICD-10-CM

## 2022-03-04 MED ORDER — LISINOPRIL 10 MG PO TABS: 10.0000 mg | ORAL_TABLET | Freq: Every day | ORAL | 1 refills | Status: DC

## 2022-03-04 NOTE — Patient Instructions (Signed)
1. Moderate persistent asthma, uncomplicated - Lung function not done today. - We are not going to make any changes today. - You are doing well.  - Daily controller medication(s): Symbicort 160/4.42mg two puffs twice daily with spacer - Prior to physical activity: albuterol 2 puffs 10-15 minutes before physical activity. - Rescue medications: albuterol 4 puffs every 4-6 hours as needed - Asthma control goals:  * Full participation in all desired activities (may need albuterol before activity) * Albuterol use two time or less a week on average (not counting use with activity) * Cough interfering with sleep two time or less a month * Oral steroids no more than once a year * No hospitalizations  2. Perennial allergic rhinitis (dust mites, cockroach) - Testing was positive slightly to grasses as well as outdoor molds. - Testing was still positive to ragweed, dust mites, and cockroach. - Since you are doing well, we are not going to worry about making any changes (because adding the grass and the molds would take you back to weekly injections.  Continue with allergy shots at the same schedule. - Continue with cetirizine 10 mg daily. - Continue with Flonase 1 to 2 sprays per nostril ONCE DAILY.  - Continue with Astelin 1 to 2 sprays per nostril ONCE DAILY.   3. No follow-ups on file.    Please inform u[place]of any Emergency Department visits, hospitalizations, or changes in symptoms. Call u[place]abefore going to the ED for breathing or allergy symptoms since we might be able to fit you in for a sick visit. Feel free to contact u[place]oreaanytime with any questions, problems, or concerns.  It was a pleasure to see you again today!  Websites that have reliable patient information: 1. American Academy of Asthma, Allergy, and Immunology: www.aaaai.org 2. Food Allergy Research and Education (FARE): foodallergy.org 3. Mothers of Asthmatics: http://www.asthmacommunitynetwork.org 4. American College of Allergy,  Asthma, and Immunology: www.acaai.org   COVID-19 Vaccine Information can be found at: [email or web address] questions related to vaccine distribution or appointments, please email [email or web address] or call [phone]   We realize that you might be concerned about having an allergic reaction to the COVID19 vaccines. To help with that concern, WE ARE OFFERING THE COVID19 VACCINES IN OUR OFFICE! Ask the front desk for dates!     "Like" u[place]on Facebook and Instagram for our latest updates!      A healthy democracy works best when ANew York Life Insuranceparticipate! Make sure you are registered to vote! If you have moved or changed any of your contact information, you will need to get this updated before voting!  In some cases, you MAY be able to register to vote online: [email or web address]   Airborne Adult Perc - 03/04/22 1400     Time Antigen Placed 1400    Allergen Manufacturer GLavella Hammock   Location Back    Number of Test 59    Panel 1 Select    1. Control-Buffer 50% Glycerol Negative    2. Control-Histamine 1 mg/ml 3+    3. Albumin saline Negative    4. BMount CarmelNegative    5. BGuatemalaNegative    6. Johnson Negative    7. KClintonBlue Negative    8. Meadow Fescue Negative    9. Perennial Rye Negative    10. Sweet Vernal Negative    11. Timothy Negative    12. Cocklebur Negative    13. Burweed Marshelder Negative    14. Ragweed, short Negative  15. Ragweed, Giant Negative    16. Plantain,  English Negative    17. Lamb's Quarters Negative    18. Sheep Sorrell Negative    19. Rough Pigweed Negative    20. Marsh Elder, Rough Negative    21. Mugwort, Common Negative    22. Ash mix Negative    23. Birch mix Negative    24. Beech American Negative    25. Box, Elder Negative    26. Cedar, red Negative    27. Cottonwood, [place] Negative    28. Elm mix Negative    29. Hickory Negative    30.  Maple mix Negative    31. Oak, [place] mix Negative    32. Pecan Pollen Negative    33. Pine mix Negative    34. Sycamore Eastern Negative    35. Sumner, Black Pollen Negative    36. Alternaria alternata Negative    37. Cladosporium Herbarum Negative    38. Aspergillus mix Negative    39. Penicillium mix Negative    40. Bipolaris sorokiniana (Helminthosporium) Negative    41. Drechslera spicifera (Curvularia) Negative    42. Mucor plumbeus Negative    43. Fusarium moniliforme Negative    44. Aureobasidium pullulans (pullulara) Negative    45. Rhizopus oryzae Negative    46. Botrytis cinera Negative    47. Epicoccum nigrum Negative    48. Phoma betae Negative    49. Candida Albicans Negative    50. Trichophyton mentagrophytes Negative    51. Mite, D Farinae  5,000 AU/ml Negative    52. Mite, D Pteronyssinus  5,000 AU/ml Negative    53. Cat Hair 10,000 BAU/ml Negative    54.  Dog Epithelia Negative    55. Mixed Feathers Negative    56. Horse Epithelia Negative    57. Cockroach, German Negative    58. Mouse Negative    59. Tobacco Leaf Negative             Intradermal - 03/04/22 1430     Time Antigen Placed 1445    Allergen Manufacturer Lavella Hammock    Location Arm    Number of Test 15    Control Negative    [place] 1+    Johnson Negative    7 Grass 1+    Ragweed mix 2+    Weed mix Negative    Tree mix Negative    Mold 1 1+    Mold 2 Negative    Mold 3 1+    Mold 4 Negative    Cat Negative    Dog Negative    Cockroach 2+    Mite mix 1+

## 2022-03-04 NOTE — Progress Notes (Signed)
FOLLOW UP  Date of Service/Encounter:  03/04/22   Assessment:   Moderate persistent asthma - did not do spirometry today per patient request   Perennial and seasonal allergic rhinitis (grasses,  ragweed, outdoor molds, dust mites, cockroach)  Currently on allergen immunotherapy for dust mites, cockroach, ragweed - on allergen immunotherapy with maintenance reached August 2020   Complex medical history   Fully vaccinated for [facility], including the latest bivalent booster   Recently experienced fainting episode - unclear etiology    Plan/Recommendations:   1. Moderate persistent asthma, uncomplicated - Lung function not done today. - We are not going to make any changes today. - You are doing well.  - Daily controller medication(s): Symbicort 160/4.57mg two puffs twice daily with spacer - Prior to physical activity: albuterol 2 puffs 10-15 minutes before physical activity. - Rescue medications: albuterol 4 puffs every 4-6 hours as needed - Asthma control goals:  * Full participation in all desired activities (may need albuterol before activity) * Albuterol use two time or less a week on average (not counting use with activity) * Cough interfering with sleep two time or less a month * Oral steroids no more than once a year * No hospitalizations  2. Perennial allergic rhinitis (dust mites, cockroach) - Testing was positive slightly to grasses as well as outdoor molds. - Testing was still positive to ragweed, dust mites, and cockroach. - Since you are doing well, we are not going to worry about making any changes (because adding the grass and the molds would take you back to weekly injections.  Continue with allergy shots at the same schedule. - Continue with cetirizine 10 mg daily. - Continue with Flonase 1 to 2 sprays per nostril ONCE DAILY.  - Continue with Astelin 1 to 2 sprays per nostril ONCE DAILY.   3. Follow up as scheduled.   Subjective:   Cheryl CZAINAB CRUMRINEis a  66y.o. female presenting today for follow up of  Chief Complaint  Patient presents with   Allergy Testing    Environmental:ALL Food: No Hx    Cheryl C MManukyanhas a history of the following: Patient Active Problem List   Diagnosis Date Noted   Hyperlipidemia associated with type 2 diabetes mellitus ([place] 01/25/2021   Type 2 diabetes mellitus with morbid obesity ([place] 01/25/2021   Moderate persistent asthma, uncomplicated 095/18/8416  OSA (obstructive sleep apnea) 12/02/2016   Hyperlipidemia LDL goal <100 10/21/2014   Essential hypertension, benign 060/63/0160  DM w/o complication type II ([facility] 09/13/2014   Routine general medical examination at a health care facility 10/12/2013   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 05/03/2013   Need for prophylactic vaccination and inoculation against influenza 02/08/2013   GERD (gastroesophageal reflux disease) 02/08/2013   Other and unspecified hyperlipidemia 11/10/2012   Type II or unspecified type diabetes mellitus without mention of complication, not stated as uncontrolled 10/25/2012   Impaired fasting glucose 09/07/2012   Morbid obesity ([facility] 09/07/2012   Generalized osteoarthrosis, involving multiple sites 09/07/2012   Carpal tunnel syndrome 09/07/2012   Other, multiple, and unspecified sites, insect bite, nonvenomous, without mention of infection(919.4) 09/07/2012   Special screening for malignant neoplasms, colon 09/07/2012   Other specified cardiac dysrhythmias(427.89) 09/07/2012   Candidiasis of vulva and vagina 09/07/2012   Abdominal pain, generalized 09/07/2012   Symptomatic menopausal or female climacteric states 09/07/2012   Hypertension associated with diabetes ([facility] 09/07/2012   Perennial allergic rhinitis 09/07/2012   Pain in joint,  site unspecified 09/07/2012   Other malaise and fatigue 09/07/2012    History obtained from: chart review and patient.  Cheryl [name] is a 66 y.o. female presenting for skin testing. We last saw  her in September 2023. At that time, lung function looked awesome. We continue with Symbicort 145mg two puffs BID as well as albuterol as needed. For her perennial allergic rhinitis, we continue with allergy shots at the same scheduled. We continued with cetirizine as well as fluticasone and azelastine.   Since the last visit, she has done well. She is here for repeat allergy testing since her allergy shots did not seem as efficacious as they once were.  However, after the testing today, she tells me she would rather just continue on what she is doing.  She thinks that she has been better, especially considering where she was before started the allergy shots.  Cheryl [name] is on allergen immunotherapy. She receives one injection. Immunotherapy script #1 contains ragweed, dust mites and cockroach. She currently receives 0.577mof the RED vial (1/100). She started shots March of 2020 and reached maintenance in August of 2020. She has had no large local reactions. She is getting shots every 4 weeks. She is doing very well with her allergy shots and not having any large local reactions.  Otherwise, there have been no changes to her past medical history, surgical history, family history, or social history.    Review of Systems  Constitutional: Negative.  Negative for chills, fever, malaise/fatigue and weight loss.  HENT:  Positive for congestion. Negative for ear discharge and ear pain.        Positive for postnasal drip.  Eyes:  Negative for pain, discharge and redness.  Respiratory:  Negative for cough, sputum production, shortness of breath and wheezing.   Cardiovascular: Negative.  Negative for chest pain and palpitations.  Gastrointestinal:  Negative for abdominal pain, heartburn, nausea and vomiting.  Skin: Negative.  Negative for itching and rash.  Neurological:  Negative for dizziness and headaches.  Endo/Heme/Allergies:  Positive for environmental allergies. Does not bruise/bleed easily.        Objective:   Blood pressure 128/68, pulse 67, temperature 98.1 F (36.7 C), resp. rate 16, height '5\' 8"'$  (1.727 m), weight 295 lb 3.2 oz (133.9 kg), SpO2 98 %. Body mass index is 44.89 kg/m.    Physical exam deferred since this was a skin testing appointment only.     Diagnostic studies:   Allergy Studies:     Airborne Adult Perc - 03/04/22 1400     Time Antigen Placed 1400    Allergen Manufacturer GrLavella Hammock  Location Back    Number of Test 59    Panel 1 Select    1. Control-Buffer 50% Glycerol Negative    2. Control-Histamine 1 mg/ml 3+    3. Albumin saline Negative    4. BaGlendaleegative    5. BeGuatemalaegative    6. Johnson Negative    7. KeNorth Bendlue Negative    8. Meadow Fescue Negative    9. Perennial Rye Negative    10. Sweet Vernal Negative    11. Timothy Negative    12. Cocklebur Negative    13. Burweed Marshelder Negative    14. Ragweed, short Negative    15. Ragweed, Giant Negative    16. Plantain,  English Negative    17. Lamb's Quarters Negative    18. Sheep Sorrell Negative    19. Rough Pigweed Negative    20.  Marsh Elder, Rough Negative    21. Mugwort, Common Negative    22. Ash mix Negative    23. Birch mix Negative    24. Beech American Negative    25. Box, Elder Negative    26. Cedar, red Negative    27. Cottonwood, [place] Negative    28. Elm mix Negative    29. Hickory Negative    30. Maple mix Negative    31. Oak, [place] mix Negative    32. Pecan Pollen Negative    33. Pine mix Negative    34. Sycamore Eastern Negative    35. [place], Black Pollen Negative    36. Alternaria alternata Negative    37. Cladosporium Herbarum Negative    38. Aspergillus mix Negative    39. Penicillium mix Negative    40. Bipolaris sorokiniana (Helminthosporium) Negative    41. Drechslera spicifera (Curvularia) Negative    42. Mucor plumbeus Negative    43. Fusarium moniliforme Negative    44. Aureobasidium pullulans (pullulara) Negative    45.  Rhizopus oryzae Negative    46. Botrytis cinera Negative    47. Epicoccum nigrum Negative    48. Phoma betae Negative    49. Candida Albicans Negative    50. Trichophyton mentagrophytes Negative    51. Mite, D Farinae  5,000 AU/ml Negative    52. Mite, D Pteronyssinus  5,000 AU/ml Negative    53. Cat Hair 10,000 BAU/ml Negative    54.  Dog Epithelia Negative    55. Mixed Feathers Negative    56. Horse Epithelia Negative    57. Cockroach, German Negative    58. Mouse Negative    59. Tobacco Leaf Negative             Intradermal - 03/04/22 1430     Time Antigen Placed 1445    Allergen Manufacturer Lavella Hammock    Location Arm    Number of Test 15    Control Negative    [place] 1+    Johnson Negative    7 Grass 1+    Ragweed mix 2+    Weed mix Negative    Tree mix Negative    Mold 1 1+    Mold 2 Negative    Mold 3 1+    Mold 4 Negative    Cat Negative    Dog Negative    Cockroach 2+    Mite mix 1+             Allergy testing results were read and interpreted by myself, documented by clinical staff.      Salvatore Marvel, MD  [facility]

## 2022-03-04 NOTE — Telephone Encounter (Signed)
Requested medication (s) are due for refill today: unsure if patient is taking  Requested medication (s) are on the active medication list: yes  Last refill:  03/21/19  Future visit scheduled: no  Notes to clinic:  Unable to refill per protocol, last refill by another provider. Last refill was 03/21/19     Requested Prescriptions  Pending Prescriptions Disp Refills   lisinopril (ZESTRIL) 10 MG tablet 30 tablet 1    Sig: Take 1 tablet (10 mg total) by mouth daily.     Cardiovascular:  ACE Inhibitors Failed - 03/03/2022  5:12 PM      Failed - Last BP in normal range    BP Readings from Last 1 Encounters:  02/20/22 (!) 140/80         Passed - Cr in normal range and within 180 days    Creat  Date Value Ref Range Status  08/17/2018 0.83 0.50 - 0.99 mg/dL Final    Comment:    For patients >68 years of age, the reference limit for Creatinine is approximately 13% higher for people identified as African-American. .    Creatinine, Ser  Date Value Ref Range Status  12/12/2021 0.97 0.57 - 1.00 mg/dL Final   Creatinine, Urine  Date Value Ref Range Status  07/27/2018 54 20 - 275 mg/dL Final         Passed - K in normal range and within 180 days    Potassium  Date Value Ref Range Status  12/12/2021 4.4 3.5 - 5.2 mmol/L Final         Passed - Patient is not pregnant      Passed - Valid encounter within last 6 months    Recent Outpatient Visits           1 month ago Type 2 diabetes mellitus with morbid obesity ([place])   [place] Ladell [place], MD   2 months ago Syncope and collapse   [place], [place]   5 months ago Type 2 diabetes mellitus with morbid obesity [facility])   [place], MD   6 months ago Dermatitis   [facility], Cari S, PA-C   9 months ago Type 2 diabetes mellitus with morbid obesity [facility])   [place], Deborah B, MD       Future Appointments             In 2 months Wynetta Emery Dalbert Batman, MD [place]   In 5 months Ernst Bowler, Gwenith Daily, MD [facility] [place]   In 7 months Deneise Lever, MD [facility]   In 11 months Deneise Lever, MD [facility]

## 2022-03-05 ENCOUNTER — Encounter: Payer: Self-pay | Admitting: Allergy & Immunology

## 2022-03-18 ENCOUNTER — Ambulatory Visit (INDEPENDENT_AMBULATORY_CARE_PROVIDER_SITE_OTHER): Payer: Medicare Other | Admitting: *Deleted

## 2022-03-18 DIAGNOSIS — J309 Allergic rhinitis, unspecified: Secondary | ICD-10-CM | POA: Diagnosis not present

## 2022-03-25 ENCOUNTER — Telehealth: Payer: Self-pay | Admitting: Emergency Medicine

## 2022-03-25 NOTE — Telephone Encounter (Signed)
Copied from [place] [id number]. Topic: General - Other >> Mar 25, 2022  2:49 PM Sabas Sous wrote: Reason for CRM: Pt called to request a bone density exam, please advise when orders are placed   Best contact: [phone]

## 2022-03-26 NOTE — Telephone Encounter (Signed)
Is she in need of another one ?

## 2022-03-27 ENCOUNTER — Telehealth: Payer: Self-pay | Admitting: Internal Medicine

## 2022-03-27 NOTE — Telephone Encounter (Signed)
Referral Request - Has patient seen PCP for this complaint? Yes  *If NO, is insurance requiring patient see PCP for this issue before PCP can refer them? Referral for which specialty: diabetes/patient already has an appt   Monday, 11/06. just needed this referral for November Preferred provider/office: [facility] Reason for referral: diabets

## 2022-03-27 NOTE — Telephone Encounter (Signed)
Called patient and she is aware of doctors note

## 2022-03-31 ENCOUNTER — Encounter: Payer: Medicare Other | Attending: Internal Medicine | Admitting: Dietician

## 2022-03-31 ENCOUNTER — Encounter: Payer: Self-pay | Admitting: Dietician

## 2022-03-31 VITALS — Wt 296.0 lb

## 2022-03-31 DIAGNOSIS — E119 Type 2 diabetes mellitus without complications: Secondary | ICD-10-CM | POA: Diagnosis not present

## 2022-03-31 NOTE — Progress Notes (Signed)
Diabetes Self-Management Education  Visit Type: Follow-up  Appt. Start Time: 1445 Appt. End Time: 9379  04/09/2022  Cheryl [name], identified by name and date of birth, is a 66 y.o. female with a diagnosis of Diabetes:  .   ASSESSMENT Patient is here today alone.  She was last seen by myself on 12/30/2021.  She is checking her blood glucose 2-3 times per day.  Fasting usually in the 90's. Hasn't been walking and gained weight.   Went to the [place] fair and has not gotten much into exercise since. Trying to get off caffeine  History includes HTN, OSA on C-pap, glaucoma, type 2 diabetes "for years".   Labs noted to include:  A1C 6.1% per patient 03/2022  Weight hx: 68" 296 lbs 03/31/2022 288 lbs 12/30/2021 290 lbs 2020 Lost 40 lbs in the past but regained after her mother's death.   Patient lives alone.  She is retired from [employer]. She owns a stationary bike but does not like this. Her son is vegetarian.  Weight 296 lb (134.3 kg). Body mass index is 45.01 kg/m.   Diabetes Self-Management Education - 04/09/22 0040       Visit Information   Visit Type Follow-up      Pre-Education Assessment   Patient understands the diabetes disease and treatment process. Comprehends key points    Patient understands incorporating nutritional management into lifestyle. Needs Review    Patient undertands incorporating physical activity into lifestyle. Demonstrates understanding / competency    Patient understands using medications safely. Demonstrates understanding / competency    Patient understands monitoring blood glucose, interpreting and using results Demonstrates understanding / competency    Patient understands prevention, detection, and treatment of acute complications. Demonstrates understanding / competency    Patient understands prevention, detection, and treatment of chronic complications. Demonstrates understanding / competency    Patient understands how to  develop strategies to address psychosocial issues. Demonstrates understanding / competency    Patient understands how to develop strategies to promote health/change behavior. Demonstrates understanding / competency      Complications   Last HgB A1C per patient/outside source 6.1 %      Dietary Intake   Breakfast yogurt, granola, nuts OR oatmeal banana    Lunch homemade vegetable soup, tuma sald sandwich on [employer], small cand bar    Dinner chips, cocolate milk    Snack (evening) low carb ice cream    Beverage(s) water, zero gatorade, chocolate milk, occasional zero Mt. Dew      Patient Education   Healthy Eating Meal options for control of blood glucose level and chronic complications.    Being Active Helped patient identify appropriate exercises in relation to his/her diabetes, diabetes complications and other health issue.    Diabetes Stress and Support Worked with patient to identify barriers to care and solutions      Patient Self-Evaluation of Goals - Patient rates self as meeting previously set goals (% of time)   Nutrition 50 - 75 % (half of the time)    Physical Activity < 25% (hardly ever/never)    Monitoring >75% (most of the time)    Problem Solving and behavior change strategies  50 - 75 % (half of the time)    Reducing Risk (treating acute and chronic complications) 50 - 75 % (half of the time)    Health Coping 25 - 50% (sometimes)      Post-Education Assessment   Patient understands the diabetes disease  and treatment process. Needs Review    Patient understands incorporating nutritional management into lifestyle. Comprehends key points    Patient undertands incorporating physical activity into lifestyle. Comprehends key points    Patient understands using medications safely. Demonstrates understanding / competency    Patient understands monitoring blood glucose, interpreting and using results Demonstrates understanding / competency    Patient understands prevention,  detection, and treatment of acute complications. Needs Review    Patient understands prevention, detection, and treatment of chronic complications. Needs Review    Patient understands how to develop strategies to address psychosocial issues. Needs Review    Patient understands how to develop strategies to promote health/change behavior. Needs Review      Outcomes   Expected Outcomes Demonstrated interest in learning. Expect positive outcomes    Future DMSE 2 months    Program Status Not Completed      Subsequent Visit   Since your last visit have you experienced any weight changes? Gain    Weight Gain (lbs) 8             Individualized Plan for Diabetes Self-Management Training:   Learning Objective:  Patient will have a greater understanding of diabetes self-management. Patient education plan is to attend individual and/or group sessions per assessed needs and concerns.   Plan:   Patient Instructions  Walk or you tube exercise video Make your decisions in the grocery store  Mindfulness:  Consistently scheduled meal - avoid skipping  Choices  Eat slowly  Away from distraction (sitting in kitchen or dining room)  Stop eating when satisfied  Before a snack ask, "Am I hungry or eating for another reason?"   "What can I do instead if I am not hungry?"  Try to find something every day that brings you joy!   Expected Outcomes:  Demonstrated interest in learning. Expect positive outcomes  Education material provided: [employer]) packet   If problems or questions, patient to contact team via:  Phone  Future DSME appointment: 2 months

## 2022-03-31 NOTE — Patient Instructions (Signed)
Walk or you tube exercise video Make your decisions in the grocery store  Mindfulness:  Consistently scheduled meal - avoid skipping  Choices  Eat slowly  Away from distraction (sitting in kitchen or dining room)  Stop eating when satisfied  Before a snack ask, "Am I hungry or eating for another reason?"   "What can I do instead if I am not hungry?"  Try to find something every day that brings you joy!

## 2022-04-07 ENCOUNTER — Telehealth: Payer: Self-pay | Admitting: Emergency Medicine

## 2022-04-07 ENCOUNTER — Other Ambulatory Visit: Payer: Self-pay | Admitting: Allergy & Immunology

## 2022-04-07 NOTE — Telephone Encounter (Signed)
Patient presented to office today to sign another medical release form, attempted to fax it to [facility] but fax number not working, release form has been now mailed to [facility] address [address] [place] [address].

## 2022-04-09 ENCOUNTER — Other Ambulatory Visit: Payer: Self-pay | Admitting: Sports Medicine

## 2022-04-09 DIAGNOSIS — M25552 Pain in left hip: Secondary | ICD-10-CM | POA: Diagnosis not present

## 2022-04-09 DIAGNOSIS — M25551 Pain in right hip: Secondary | ICD-10-CM | POA: Diagnosis not present

## 2022-04-09 DIAGNOSIS — S32000A Wedge compression fracture of unspecified lumbar vertebra, initial encounter for closed fracture: Secondary | ICD-10-CM

## 2022-04-09 DIAGNOSIS — M4310 Spondylolisthesis, site unspecified: Secondary | ICD-10-CM

## 2022-04-09 DIAGNOSIS — M545 Low back pain, unspecified: Secondary | ICD-10-CM | POA: Diagnosis not present

## 2022-04-15 ENCOUNTER — Ambulatory Visit (INDEPENDENT_AMBULATORY_CARE_PROVIDER_SITE_OTHER): Payer: Medicare Other

## 2022-04-15 DIAGNOSIS — J309 Allergic rhinitis, unspecified: Secondary | ICD-10-CM

## 2022-04-16 DIAGNOSIS — E1151 Type 2 diabetes mellitus with diabetic peripheral angiopathy without gangrene: Secondary | ICD-10-CM | POA: Diagnosis not present

## 2022-04-16 DIAGNOSIS — L603 Nail dystrophy: Secondary | ICD-10-CM | POA: Diagnosis not present

## 2022-04-16 DIAGNOSIS — L84 Corns and callosities: Secondary | ICD-10-CM | POA: Diagnosis not present

## 2022-04-16 DIAGNOSIS — E1142 Type 2 diabetes mellitus with diabetic polyneuropathy: Secondary | ICD-10-CM | POA: Diagnosis not present

## 2022-04-16 DIAGNOSIS — I739 Peripheral vascular disease, unspecified: Secondary | ICD-10-CM | POA: Diagnosis not present

## 2022-04-21 DIAGNOSIS — S32010D Wedge compression fracture of first lumbar vertebra, subsequent encounter for fracture with routine healing: Secondary | ICD-10-CM | POA: Diagnosis not present

## 2022-04-21 DIAGNOSIS — M5451 Vertebrogenic low back pain: Secondary | ICD-10-CM | POA: Diagnosis not present

## 2022-04-24 DIAGNOSIS — S32010D Wedge compression fracture of first lumbar vertebra, subsequent encounter for fracture with routine healing: Secondary | ICD-10-CM | POA: Diagnosis not present

## 2022-04-24 DIAGNOSIS — M5451 Vertebrogenic low back pain: Secondary | ICD-10-CM | POA: Diagnosis not present

## 2022-04-27 ENCOUNTER — Other Ambulatory Visit: Payer: Medicare Other

## 2022-04-29 DIAGNOSIS — M5451 Vertebrogenic low back pain: Secondary | ICD-10-CM | POA: Diagnosis not present

## 2022-04-29 DIAGNOSIS — S32010D Wedge compression fracture of first lumbar vertebra, subsequent encounter for fracture with routine healing: Secondary | ICD-10-CM | POA: Diagnosis not present

## 2022-05-01 DIAGNOSIS — Z6841 Body Mass Index (BMI) 40.0 and over, adult: Secondary | ICD-10-CM | POA: Diagnosis not present

## 2022-05-01 DIAGNOSIS — Z90711 Acquired absence of uterus with remaining cervical stump: Secondary | ICD-10-CM | POA: Diagnosis not present

## 2022-05-01 DIAGNOSIS — Z01419 Encounter for gynecological examination (general) (routine) without abnormal findings: Secondary | ICD-10-CM | POA: Diagnosis not present

## 2022-05-01 DIAGNOSIS — R32 Unspecified urinary incontinence: Secondary | ICD-10-CM | POA: Diagnosis not present

## 2022-05-01 DIAGNOSIS — Z13820 Encounter for screening for osteoporosis: Secondary | ICD-10-CM | POA: Diagnosis not present

## 2022-05-02 DIAGNOSIS — S32010D Wedge compression fracture of first lumbar vertebra, subsequent encounter for fracture with routine healing: Secondary | ICD-10-CM | POA: Diagnosis not present

## 2022-05-02 DIAGNOSIS — M5451 Vertebrogenic low back pain: Secondary | ICD-10-CM | POA: Diagnosis not present

## 2022-05-05 ENCOUNTER — Ambulatory Visit
Admission: RE | Admit: 2022-05-05 | Discharge: 2022-05-05 | Disposition: A | Discharge status: Home / Self Care | Payer: Medicare Other | Source: Ambulatory Visit | Attending: Sports Medicine | Admitting: Sports Medicine

## 2022-05-05 DIAGNOSIS — S32000A Wedge compression fracture of unspecified lumbar vertebra, initial encounter for closed fracture: Secondary | ICD-10-CM

## 2022-05-05 DIAGNOSIS — M48061 Spinal stenosis, lumbar region without neurogenic claudication: Secondary | ICD-10-CM | POA: Diagnosis not present

## 2022-05-05 DIAGNOSIS — M4310 Spondylolisthesis, site unspecified: Secondary | ICD-10-CM

## 2022-05-05 DIAGNOSIS — M4316 Spondylolisthesis, lumbar region: Secondary | ICD-10-CM | POA: Diagnosis not present

## 2022-05-05 DIAGNOSIS — M545 Low back pain, unspecified: Secondary | ICD-10-CM | POA: Diagnosis not present

## 2022-05-05 DIAGNOSIS — M5136 Other intervertebral disc degeneration, lumbar region: Secondary | ICD-10-CM | POA: Diagnosis not present

## 2022-05-06 DIAGNOSIS — S32010D Wedge compression fracture of first lumbar vertebra, subsequent encounter for fracture with routine healing: Secondary | ICD-10-CM | POA: Diagnosis not present

## 2022-05-06 DIAGNOSIS — M5451 Vertebrogenic low back pain: Secondary | ICD-10-CM | POA: Diagnosis not present

## 2022-05-07 ENCOUNTER — Encounter: Payer: Self-pay | Admitting: Internal Medicine

## 2022-05-07 NOTE — Progress Notes (Signed)
I received medical records from [facility] in [place].  This was the hospital admission and discharge note for when she was hospitalized in June of this year with syncope. Studies that were done included CAT scan of the head that revealed trace left occipital soft tissue contusion otherwise negative. Chest x-ray no acute cardiopulmonary abnormalities noted. CT of the neck revealed multilevel disc osteophyte complexes but no evidence of fracture. Overall assessment was that her fainting spell was likely vasovagal.  Patient told to hold triamterene/HCTZ and lisinopril in the future in situations where she may have an acute illness or would be more prone to dehydration.

## 2022-05-09 DIAGNOSIS — M5416 Radiculopathy, lumbar region: Secondary | ICD-10-CM | POA: Diagnosis not present

## 2022-05-09 DIAGNOSIS — M4316 Spondylolisthesis, lumbar region: Secondary | ICD-10-CM | POA: Diagnosis not present

## 2022-05-12 ENCOUNTER — Other Ambulatory Visit: Payer: Self-pay | Admitting: Obstetrics and Gynecology

## 2022-05-12 DIAGNOSIS — Z13820 Encounter for screening for osteoporosis: Secondary | ICD-10-CM

## 2022-05-13 ENCOUNTER — Ambulatory Visit (INDEPENDENT_AMBULATORY_CARE_PROVIDER_SITE_OTHER): Payer: Medicare Other

## 2022-05-13 DIAGNOSIS — J309 Allergic rhinitis, unspecified: Secondary | ICD-10-CM

## 2022-05-14 ENCOUNTER — Telehealth: Payer: Self-pay | Admitting: Allergy & Immunology

## 2022-05-14 MED ORDER — FLUTICASONE PROPIONATE 50 MCG/ACT NA SUSP: 1.0000 | Freq: Every day | NASAL | 0 refills | Status: DC

## 2022-05-14 NOTE — Telephone Encounter (Signed)
Lm explaining I have sent in refill of flonase to [facility] [address] and to call if she has any questions

## 2022-05-14 NOTE — Telephone Encounter (Signed)
Pt request refill for flonase

## 2022-05-15 DIAGNOSIS — S32010D Wedge compression fracture of first lumbar vertebra, subsequent encounter for fracture with routine healing: Secondary | ICD-10-CM | POA: Diagnosis not present

## 2022-05-15 DIAGNOSIS — M5451 Vertebrogenic low back pain: Secondary | ICD-10-CM | POA: Diagnosis not present

## 2022-05-21 ENCOUNTER — Ambulatory Visit
Admission: EM | Admit: 2022-05-21 | Discharge: 2022-05-21 | Disposition: A | Discharge status: Home / Self Care | Payer: Medicare Other | Attending: Emergency Medicine | Admitting: Emergency Medicine

## 2022-05-21 DIAGNOSIS — Z20822 Contact with and (suspected) exposure to covid-19: Secondary | ICD-10-CM | POA: Diagnosis not present

## 2022-05-21 DIAGNOSIS — J019 Acute sinusitis, unspecified: Secondary | ICD-10-CM | POA: Diagnosis not present

## 2022-05-21 DIAGNOSIS — J3089 Other allergic rhinitis: Secondary | ICD-10-CM | POA: Diagnosis not present

## 2022-05-21 DIAGNOSIS — U071 COVID-19: Secondary | ICD-10-CM | POA: Insufficient documentation

## 2022-05-21 MED ORDER — AMOXICILLIN-POT CLAVULANATE 875-125 MG PO TABS: 1.0000 | ORAL_TABLET | Freq: Two times a day (BID) | ORAL | 0 refills | Status: DC

## 2022-05-21 NOTE — ED Triage Notes (Signed)
Pt presents with non productive cough, nasal drainage and chills X 3 days.

## 2022-05-21 NOTE — Discharge Instructions (Addendum)
Unfortunately, you are out of the treatment window for influenza as you have had symptoms for 72 hours.  I suspect a viral sinusitis.  Discontinue levocetirizine, continue Astelin and Flonase, start saline nasal irrigation with a NeilMed rinse and distilled water as often as you want, start Mucinex.  I would wait several days to fill the Augmentin.  If you start to get better with this medication regimen, you do not need the Augmentin.  2 puffs from your albuterol inhaler every 4-6 hours as needed for coughing.  Follow-up with your doctor if not better after finishing the antibiotics.

## 2022-05-21 NOTE — ED Provider Notes (Signed)
HPI  SUBJECTIVE:  Cheryl [name] is a 66 y.o. female who presents with 3 days of nausea, dry cough, nasal congestion, occasional mild headache, chills, sinus pain and pressure, upper dental pain.  She states symptoms started with a sore throat, but this has resolved.  No fevers, facial swelling, rhinorrhea, postnasal drip, body aches, wheezing, shortness of breath, dyspnea on exertion, vomiting, diarrhea, abdominal pain.  No known COVID or flu exposure.  She got 4 doses of the COVID-vaccine and this years flu and RSV vaccine.  She had 2 negative home COVID tests.  No antibiotics in the past month.  She took Tylenol within 6 hours of evaluation.  She takes Tylenol for chronic pain.  There are no aggravating or alleviating factors.  She is able to sleep at night without waking up coughing.  She has a past medical history of diabetes, hypercholesterolemia, hypertension, asthma, OSA on CPAP, and syncope with no identifiable cause.  PCP: [facility].   Past Medical History:  Diagnosis Date   Allergic rhinitis, cause unspecified    Arthritis    Asthma    Carpal tunnel syndrome    Cataract    Diabetes mellitus without complication (HCC)    diet- controlled   GERD (gastroesophageal reflux disease)    Glaucoma    Hypertension    Impaired fasting glucose    Morbid obesity (HCC)    OSA (obstructive sleep apnea) 12/02/2016   Other malaise and fatigue    Other specified cardiac dysrhythmias(427.89)    Pain in joint, site unspecified    Symptomatic menopausal or female climacteric states    Syncope and collapse     Past Surgical History:  Procedure Laterality Date   ABDOMINAL HYSTERECTOMY     1994   carpal tunnel both hands     2012   CLOSED MANIPULATION SHOULDER Left 04/2014   EYE SURGERY Left 08/25/2015   EYE SURGERY Right 09/24/2015   JOINT REPLACEMENT     both knees replacement, 2010   mole removed from face     Nodule removed from back     SHOULDER SURGERY Left 11/2013    TONSILLECTOMY     as teenager    Family History  Problem Relation Age of Onset   Cancer Father    Diabetes Father    Allergic rhinitis Son    Diabetes Brother    Allergic rhinitis Son    Diabetes Paternal Aunt    Lung cancer Cousin    Asthma Neg Hx     Social History   Tobacco Use   Smoking status: Never   Smokeless tobacco: Never  Vaping Use   Vaping Use: Never used  Substance Use Topics   Alcohol use: Yes    Comment: Seldom- Wine    Drug use: No    No current facility-administered medications for this encounter.  Current Outpatient Medications:    amoxicillin-clavulanate (AUGMENTIN) 875-125 MG tablet, Take 1 tablet by mouth every 12 (twelve) hours., Disp: 14 tablet, Rfl: 0   acetaminophen (TYLENOL) 500 MG tablet, Take 500 mg by mouth daily., Disp: , Rfl:    albuterol (VENTOLIN HFA) 108 (90 Base) MCG/ACT inhaler, TAKE 2 PUFFS BY MOUTH EVERY 6 HOURS AS NEEDED FOR WHEEZE OR SHORTNESS OF BREATH, Disp: 18 each, Rfl: 1   AMBULATORY NON FORMULARY MEDICATION, Medication Name: Allergy Injection- every 4 weeks, Disp: , Rfl:    aspirin 81 MG tablet, Take 81 mg by mouth daily., Disp: , Rfl:  azelastine (ASTELIN) 0.1 % nasal spray, Place 2 sprays into both nostrils 2 (two) times daily., Disp: 30 mL, Rfl: 5   B-D ULTRA-FINE 33 LANCETS MISC, Use to test blood sugar once daily. Dx: E11.9, Disp: 100 each, Rfl: 6   budesonide-formoterol (SYMBICORT) 160-4.5 MCG/ACT inhaler, TAKE 2 PUFFS BY MOUTH TWICE A DAY, Disp: 30.6 each, Rfl: 6   Calcium Carbonate-Vitamin D 600-400 MG-UNIT chew tablet, Chew 1 tablet by mouth daily. , Disp: , Rfl:    EPINEPHrine 0.3 mg/0.3 mL IJ SOAJ injection, Inject 0.3 mg into the muscle as needed for anaphylaxis., Disp: 2 each, Rfl: 1   fluticasone (FLONASE) 50 MCG/ACT nasal spray, Place 1-2 sprays into both nostrils daily., Disp: 48 mL, Rfl: 0   lisinopril (ZESTRIL) 10 MG tablet, Take 1 tablet (10 mg total) by mouth daily., Disp: 90 tablet, Rfl: 1   meloxicam  (MOBIC) 15 MG tablet, Take 15 mg by mouth as needed., Disp: , Rfl:    molnupiravir EUA (LAGEVRIO) 200 mg CAPS capsule, Take 4 capsules (800 mg total) by mouth 2 (two) times daily for 5 days., Disp: 40 capsule, Rfl: 0   Multiple Vitamin (MULTIVITAMIN ADULT PO), 1 tablet, Disp: , Rfl:    omeprazole (PRILOSEC) 40 MG capsule, TAKE 1 CAPSULE BY MOUTH EVERY DAY, Disp: 90 capsule, Rfl: 0   Respiratory Therapy Supplies (CARETOUCH 2 CPAP HOSE HANGER) MISC, , Disp: , Rfl:    simvastatin (ZOCOR) 20 MG tablet, Take one tablet by mouth once daily at bedtime for cholesterol, Disp: 90 tablet, Rfl: 2   triamterene-hydrochlorothiazide (MAXZIDE) 75-50 MG tablet, TAKE 1 TABLET BY MOUTH EVERY DAY, Disp: 90 tablet, Rfl: 3   vitamin C (ASCORBIC ACID) 500 MG tablet, Take 500 mg by mouth every other day., Disp: , Rfl:   Allergies  Allergen Reactions   Misc. Sulfonamide Containing Compounds     Other reaction(s): Not available   Elemental Sulfur Diarrhea   Oxycodone     Had stomach and headache as side effect from medicine     ROS  As noted in HPI.   Physical Exam  BP 132/73 (BP Location: Right Arm)   Pulse 69   Temp 98.3 F (36.8 C) (Oral)   Resp 17   SpO2 97%   Constitutional: Well developed, well nourished, no acute distress Eyes:  EOMI, conjunctiva normal bilaterally HENT: Normocephalic, atraumatic,mucus membranes moist.  Erythematous, swollen turbinates.  Clear nasal congestion.  No maxillary, frontal sinus tenderness.  Unable to completely visualize oropharynx. Neck: No cervical lymphadenopathy Respiratory: Normal inspiratory effort, lungs clear bilaterally.  No anterior, lateral chest wall tenderness Cardiovascular: Normal rate, regular rhythm, no murmurs rubs or gallops GI: nondistended skin: No rash, skin intact Musculoskeletal: no deformities Neurologic: Alert & oriented x 3, no focal neuro deficits Psychiatric: Speech and behavior appropriate   ED Course   Medications - No data to  display  Orders Placed This Encounter  Procedures   SARS CORONAVIRUS 2 (TAT 6-24 HRS) Anterior Nasal Swab    Standing Status:   Standing    Number of Occurrences:   1    Results for orders placed or performed during the hospital encounter of 05/21/22 (from the past 24 hour(s))  SARS CORONAVIRUS 2 (TAT 6-24 HRS) Anterior Nasal Swab     Status: Abnormal   Collection Time: 05/21/22  1:00 PM   Specimen: Anterior Nasal Swab  Result Value Ref Range   SARS Coronavirus 2 POSITIVE (A) NEGATIVE   No results found.  ED Clinical  Impression  1. COVID-19 virus infection   2. Acute non-recurrent sinusitis, unspecified location   3. Encounter for laboratory testing for COVID-19 virus      ED Assessment/Plan     Presentation consistent with an upper respiratory infection/viral sinusitis.  Checking COVID as I would start her on molnupiravir if positive.  Unfortunately, she is out of the treatment window for influenza as she has had symptoms for 72 hours.  Will have patient discontinue levocetirizine, continue Astelin and Flonase, start saline nasal irrigation, start Mucinex, and a wait-and-see prescription of Augmentin she is describing severe symptoms of upper dental pain. Follow up with PCP as needed.  COVID-positive.  E prescribed molnupiravir to pharmacy on record.  Sent MyChart note to patient notifying her of positive results and of prescription waiting for her.  Will also have staff reach out to her to make sure that she gets the message.  Quarantine for 5 days and mask for 5 days thereafter.  Otherwise, continue plan as above.  Discussed labs,MDM, treatment plan, and plan for follow-up with patient.  patient agrees with plan.   Meds ordered this encounter  Medications   amoxicillin-clavulanate (AUGMENTIN) 875-125 MG tablet    Sig: Take 1 tablet by mouth every 12 (twelve) hours.    Dispense:  14 tablet    Refill:  0      *This clinic note was created using Theatre manager. Therefore, there may be occasional mistakes despite careful proofreading.  ?    Melynda Ripple, MD 05/22/22 [phone]

## 2022-05-21 NOTE — Progress Notes (Signed)
VIAL EXP 05-22-23

## 2022-05-22 ENCOUNTER — Telehealth: Payer: Self-pay | Admitting: Emergency Medicine

## 2022-05-22 LAB — SARS CORONAVIRUS 2 (TAT 6-24 HRS): SARS Coronavirus 2: POSITIVE — AB

## 2022-05-22 MED ORDER — MOLNUPIRAVIR EUA 200MG CAPSULE: 4.0000 | ORAL_CAPSULE | Freq: Two times a day (BID) | ORAL | 0 refills | Status: AC

## 2022-05-22 NOTE — Telephone Encounter (Signed)
Results for orders placed or performed during the hospital encounter of 05/21/22  SARS CORONAVIRUS 2 (TAT 6-24 HRS) Anterior Nasal Swab   Specimen: Anterior Nasal Swab  Result Value Ref Range   SARS Coronavirus 2 POSITIVE (A) NEGATIVE   COVID-positive.  Prescribing molnupiravir to pharmacy on record.

## 2022-05-28 DIAGNOSIS — S32010D Wedge compression fracture of first lumbar vertebra, subsequent encounter for fracture with routine healing: Secondary | ICD-10-CM | POA: Diagnosis not present

## 2022-05-28 DIAGNOSIS — M5451 Vertebrogenic low back pain: Secondary | ICD-10-CM | POA: Diagnosis not present

## 2022-05-30 ENCOUNTER — Encounter: Payer: Self-pay | Admitting: Internal Medicine

## 2022-05-30 ENCOUNTER — Ambulatory Visit: Payer: Medicare Other | Attending: Internal Medicine | Admitting: Internal Medicine

## 2022-05-30 DIAGNOSIS — E1159 Type 2 diabetes mellitus with other circulatory complications: Secondary | ICD-10-CM | POA: Insufficient documentation

## 2022-05-30 DIAGNOSIS — J4540 Moderate persistent asthma, uncomplicated: Secondary | ICD-10-CM | POA: Diagnosis not present

## 2022-05-30 DIAGNOSIS — E1169 Type 2 diabetes mellitus with other specified complication: Secondary | ICD-10-CM | POA: Diagnosis not present

## 2022-05-30 DIAGNOSIS — G4733 Obstructive sleep apnea (adult) (pediatric): Secondary | ICD-10-CM | POA: Insufficient documentation

## 2022-05-30 DIAGNOSIS — I152 Hypertension secondary to endocrine disorders: Secondary | ICD-10-CM

## 2022-05-30 DIAGNOSIS — Z79899 Other long term (current) drug therapy: Secondary | ICD-10-CM | POA: Insufficient documentation

## 2022-05-30 DIAGNOSIS — I10 Essential (primary) hypertension: Secondary | ICD-10-CM | POA: Diagnosis not present

## 2022-05-30 DIAGNOSIS — Z6841 Body Mass Index (BMI) 40.0 and over, adult: Secondary | ICD-10-CM | POA: Insufficient documentation

## 2022-05-30 DIAGNOSIS — E785 Hyperlipidemia, unspecified: Secondary | ICD-10-CM | POA: Diagnosis not present

## 2022-05-30 LAB — POCT GLYCOSYLATED HEMOGLOBIN (HGB A1C): HbA1c, POC (controlled diabetic range): 6.1 % (ref 0.0–7.0)

## 2022-05-30 LAB — GLUCOSE, POCT (MANUAL RESULT ENTRY): POC Glucose: 94 mg/dl (ref 70–99)

## 2022-05-30 NOTE — Progress Notes (Signed)
Patient ID: Cheryl [name], female    DOB: 08/13/1955  MRN: [id number]  CC: Diabetes (DM f/u. [name] received flu vax.)   Subjective: Cheryl [name] is a 67 y.o. female who presents for chronic ds management Her concerns today include:  Pt with hx of DM type 2, HTN, HL, moderate persistent asthma, OSA on CPAP, morbid obesity, arthritis/LBP, chronic pelvic pain syndrome/pelvic floor dysfunction followed by urology Dr. Gorden Harms   Patient recently seen in the ER 05/21/2022 with diagnosis of sinusitis and COVID.  She has completed the course of Molnupiravir and feeling much better.  Last COVID booster was 09/2021  DM/Obesity:  A1C 6.1/BS 94 Diet control. Checks BS 2-3xday to help her understand how certain foods affect BS.  Range 115-120.  Highest was 145.  Has been diet controlled for a while Doing okay with eat habits. Down 2 lbs since last visit Back has been bothering her.  Seeing [facility]. Had inj to back and referred to P.T.  Going to P.T 2 x a wk.  She feels the inj and P.T are helping.  Rides a stationary bike at home daily for 5-10 mins  HTN: Reports compliance with Maxide 75/50 mg and lisinopril 10 mg daily. Limits salt Checks BP at home and readings good  BP was elev for several days after steroid inj No chest pains or shortness of breath.  HL: Taking and tolerating Zocor 20 mg daily with last LDL of 84.  HM:  Had RSV 1st wk in 01/2022.  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 05/03/2013   Need for prophylactic vaccination and inoculation against influenza 02/08/2013   GERD (gastroesophageal reflux disease) 02/08/2013   Other and unspecified hyperlipidemia 11/10/2012   Type II or unspecified type diabetes mellitus without mention of complication, not stated as uncontrolled 10/25/2012   Impaired fasting glucose 09/07/2012   Morbid obesity ([place]) 09/07/2012   Generalized osteoarthrosis, involving multiple sites 09/07/2012   Carpal tunnel syndrome 09/07/2012   Other, multiple, and unspecified sites, insect bite, nonvenomous, without mention of infection(919.4) 09/07/2012   Special screening for malignant neoplasms, colon 09/07/2012   Other specified cardiac dysrhythmias(427.89) 09/07/2012   Candidiasis of vulva and vagina 09/07/2012   Abdominal pain, generalized 09/07/2012   Symptomatic menopausal or female climacteric states 09/07/2012   Hypertension associated with diabetes ([place]) 09/07/2012   Perennial allergic rhinitis 09/07/2012   Pain in joint, site unspecified 09/07/2012   Other malaise and fatigue 09/07/2012     Current Outpatient Medications on File Prior to Visit  Medication Sig Dispense Refill   acetaminophen (TYLENOL) 500 MG tablet Take 500 mg by mouth daily.     albuterol (VENTOLIN HFA) 108 (90 Base) MCG/ACT inhaler TAKE 2 PUFFS BY MOUTH EVERY 6 HOURS AS NEEDED FOR WHEEZE OR SHORTNESS OF BREATH 18 each 1   AMBULATORY NON FORMULARY MEDICATION Medication Name: Allergy Injection- every 4 weeks     aspirin 81 MG tablet Take 81 mg by mouth daily.     azelastine (ASTELIN) 0.1 %  nasal spray Place 2 sprays into both nostrils 2 (two) times daily. 30 mL 5   B-D ULTRA-FINE 33 LANCETS MISC Use to test blood sugar once daily. Dx: E11.9 100 each 6   budesonide-formoterol (SYMBICORT) 160-4.5 MCG/ACT inhaler TAKE 2 PUFFS BY MOUTH TWICE A DAY 30.6 each 6   Calcium Carbonate-Vitamin D 600-400 MG-UNIT chew tablet Chew 1 tablet by mouth daily.       EPINEPHrine 0.3 mg/0.3 mL IJ SOAJ injection Inject 0.3 mg into the muscle as needed for anaphylaxis. 2 each 1   fluticasone (FLONASE) 50 MCG/ACT nasal spray Place 1-2 sprays into both nostrils daily. 48 mL 0   lisinopril (ZESTRIL) 10 MG tablet Take 1 tablet (10 mg total) by mouth daily. 90 tablet 1   meloxicam (MOBIC) 15 MG tablet Take 15 mg by mouth as needed.     molnupiravir EUA (LAGEVRIO) 200 MG CAPS capsule Take 4 capsules by mouth 2 (two) times daily. Take 4 capsules (800 mg total) by mouth twice a day for 5 days.     Multiple Vitamin (MULTIVITAMIN ADULT PO) 1 tablet     omeprazole (PRILOSEC) 40 MG capsule TAKE 1 CAPSULE BY MOUTH EVERY DAY 90 capsule 0   Respiratory Therapy Supplies (CARETOUCH 2 CPAP HOSE HANGER) MISC      simvastatin (ZOCOR) 20 MG tablet Take one tablet by mouth once daily at bedtime for cholesterol 90 tablet 2   triamterene-hydrochlorothiazide (MAXZIDE) 75-50 MG tablet TAKE 1 TABLET BY MOUTH EVERY DAY 90 tablet 3   vitamin C (ASCORBIC ACID) 500 MG tablet Take 500 mg by mouth every other day.     No current facility-administered medications on file prior to visit.    Allergies  Allergen Reactions   Misc. Sulfonamide Containing Compounds     Other reaction(s): Not available   Elemental Sulfur Diarrhea   Oxycodone     Had stomach and headache as side effect from medicine    Social History   Socioeconomic History   Marital status: Divorced    Spouse name: Not on file   Number of children: Not on file   Years of education: Not on file   Highest education level: Not on file  Occupational History   Not on file  Tobacco Use   Smoking status: Never   Smokeless tobacco: Never  Vaping Use   Vaping Use: Never used  Substance and Sexual Activity   Alcohol use: Yes    Comment: Seldom- Wine    Drug use: No   Sexual activity: Not Currently    Comment: post office worker  Other Topics Concern   Not on file  Social History Narrative   Not on file   Social  Determinants of Health   Financial Resource Strain: Low Risk  (11/13/2017)   Overall Financial Resource Strain (CARDIA)    Difficulty of Paying Living Expenses: Not hard at all  Food Insecurity: No Food Insecurity (11/13/2017)   Hunger Vital Sign    Worried About Running Out of Food in the Last Year: Never true    [place] in the Last Year: Never true  Transportation Needs: No Transportation Needs (11/13/2017)   PRAPARE - Hydrologist (Medical): No    Lack of Transportation (Non-Medical): No  Physical Activity: Inactive (11/13/2017)   Exercise Vital Sign    Days of Exercise per Week: 0 days    Minutes of Exercise per Session: 0 min  Stress: Stress Concern Present (11/13/2017)  [place] Questionnaire    Feeling of Stress : To some extent  Social Connections: Moderately Isolated (11/13/2017)   Social Connection and Isolation Panel [NHANES]    Frequency of Communication with Friends and Family: More than three times a week    Frequency of Social Gatherings with Friends and Family: Never    Attends Religious Services: Never    Marine scientist or Organizations: No    Attends Archivist Meetings: Never    Marital Status: Never married  Intimate Partner Violence: Not At Risk (11/13/2017)   Humiliation, Afraid, Rape, and Kick questionnaire    Fear of Current or Ex-Partner: No    Emotionally Abused: No    Physically Abused: No    Sexually Abused: No    Family History  Problem Relation Age of Onset   Cancer Father    Diabetes Father    Allergic rhinitis Son    Diabetes Brother    Allergic rhinitis Son    Diabetes Paternal Aunt    Lung cancer Cousin    Asthma Neg Hx     Past Surgical History:  Procedure Laterality Date   ABDOMINAL HYSTERECTOMY     1994   carpal tunnel both hands     2012   CLOSED MANIPULATION SHOULDER Left 04/2014   EYE SURGERY Left 08/25/2015   EYE SURGERY  Right 09/24/2015   JOINT REPLACEMENT     both knees replacement, 2010   mole removed from face     Nodule removed from back     SHOULDER SURGERY Left 11/2013   TONSILLECTOMY     as teenager    ROS: Review of Systems Negative except as stated above  PHYSICAL EXAM: BP 125/73 (BP Location: Left Arm, Patient Position: Sitting, Cuff Size: Normal)   Pulse (!) 59   Temp 98.4 F (36.9 C) (Oral)   Ht '5\' 8"'$  (1.727 m)   Wt 294 lb (133.4 kg)   SpO2 99%   BMI 44.70 kg/m   Wt Readings from Last 3 Encounters:  05/30/22 294 lb (133.4 kg)  04/09/22 296 lb (134.3 kg)  03/04/22 295 lb 3.2 oz (133.9 kg)    Physical Exam  General appearance - alert, well appearing, older African-American female and in no distress Mental status - normal mood, behavior, speech, dress, motor activity, and thought processes Neck - supple, no significant adenopathy Chest - clear to auscultation, no wheezes, rales or rhonchi, symmetric air entry Heart - normal rate, regular rhythm, normal S1, S2, no murmurs, rubs, clicks or gallops Extremities - peripheral pulses normal, no pedal edema, no clubbing or cyanosis Continue riding    Latest Ref Rng & Units 01/28/2022    4:29 PM 12/12/2021   10:41 AM 01/25/2021    2:38 PM  CMP  Glucose 70 - 99 mg/dL  107  92   BUN 8 - 27 mg/dL  14  12   Creatinine 0.57 - 1.00 mg/dL  0.97  0.97   Sodium 134 - 144 mmol/L  143  141   Potassium 3.5 - 5.2 mmol/L  4.4  4.4   Chloride 96 - 106 mmol/L  101  100   CO2 20 - 29 mmol/L  24  24   Calcium 8.7 - 10.3 mg/dL  10.1  10.2   Total Protein 6.0 - 8.5 g/dL 7.5   7.5   Total Bilirubin 0.0 - 1.2 mg/dL <0.2   0.3   Alkaline Phos 44 -  121 IU/L 69   68   AST 0 - 40 IU/L 19   24   ALT 0 - 32 IU/L 14   14    Lipid Panel     Component Value Date/Time   CHOL 167 01/28/2022 1629   TRIG 175 (H) 01/28/2022 1629   HDL 53 01/28/2022 1629   CHOLHDL 3.2 01/28/2022 1629   CHOLHDL 3.0 07/23/2018 1045   VLDL 20 11/17/2016 1007   LDLCALC 84  01/28/2022 1629   LDLCALC 86 07/23/2018 1045    CBC    Component Value Date/Time   WBC 8.6 01/28/2022 1629   WBC 9.4 11/13/2017 1025   RBC 4.94 01/28/2022 1629   RBC 4.80 11/13/2017 1025   HGB 13.7 01/28/2022 1629   HCT 41.1 01/28/2022 1629   PLT 354 01/28/2022 1629   MCV 83 01/28/2022 1629   MCH 27.7 01/28/2022 1629   MCH 26.9 (L) 11/13/2017 1025   MCHC 33.3 01/28/2022 1629   MCHC 33.3 11/13/2017 1025   RDW 14.6 01/28/2022 1629   LYMPHSABS 2.3 07/29/2018 1443   MONOABS 576 11/17/2016 1007   EOSABS 0.2 07/29/2018 1443   BASOSABS 0.0 07/29/2018 1443    ASSESSMENT AND PLAN:  1. Type 2 diabetes mellitus with morbid obesity ([place]) At goal on diet control.  Encouraged her to continue healthy eating habits and trying to move is much as she can. - POCT glucose (manual entry) - POCT glycosylated hemoglobin (Hb A1C)  2. Hypertension associated with diabetes ([place]) Control on current dose of Maxide and lisinopril 10 mg.  Continue current medications.  3. Hyperlipidemia associated with type 2 diabetes mellitus ([facility]) Continue Zocor 20 mg daily.    Patient was given the opportunity to ask questions.  Patient verbalized understanding of the plan and was able to repeat key elements of the plan.   This documentation was completed using Radio producer.  Any transcriptional errors are unintentional.  Orders Placed This Encounter  Procedures   POCT glucose (manual entry)   POCT glycosylated hemoglobin (Hb A1C)     Requested Prescriptions    No prescriptions requested or ordered in this encounter    No follow-ups on file.  Karle Plumber, MD, FACP

## 2022-05-30 NOTE — Patient Instructions (Signed)
Your bike at least 4 days a week.  Gradually try to build up to about 15 to 20 minutes. Healthy Eating Following a healthy eating pattern may help you to achieve and maintain a healthy body weight, reduce the risk of chronic disease, and live a long and productive life. It is important to follow a healthy eating pattern at an appropriate calorie level for your body. Your nutritional needs should be met primarily through food by choosing a variety of nutrient-rich foods. What are tips for following this plan? Reading food labels Read labels and choose the following: Reduced or low sodium. Juices with 100% fruit juice. Foods with low saturated fats and high polyunsaturated and monounsaturated fats. Foods with whole grains, such as whole wheat, cracked wheat, brown rice, and wild rice. Whole grains that are fortified with folic acid. This is recommended for women who are pregnant or who want to become pregnant. Read labels and avoid the following: Foods with a lot of added sugars. These include foods that contain brown sugar, corn sweetener, corn syrup, dextrose, fructose, glucose, high-fructose corn syrup, honey, invert sugar, lactose, malt syrup, maltose, molasses, raw sugar, sucrose, trehalose, or turbinado sugar. Do not eat more than the following amounts of added sugar per day: 6 teaspoons (25 g) for women. 9 teaspoons (38 g) for men. Foods that contain processed or refined starches and grains. Refined grain products, such as white flour, degermed cornmeal, white bread, and white rice. Shopping Choose nutrient-rich snacks, such as vegetables, whole fruits, and nuts. Avoid high-calorie and high-sugar snacks, such as potato chips, fruit snacks, and candy. Use oil-based dressings and spreads on foods instead of solid fats such as butter, stick margarine, or cream cheese. Limit pre-made sauces, mixes, and "instant" products such as flavored rice, instant noodles, and ready-made pasta. Try more  plant-protein sources, such as tofu, tempeh, black beans, edamame, lentils, nuts, and seeds. Explore eating plans such as the Mediterranean diet or vegetarian diet. Cooking Use oil to saut or stir-fry foods instead of solid fats such as butter, stick margarine, or lard. Try baking, boiling, grilling, or broiling instead of frying. Remove the fatty part of meats before cooking. Steam vegetables in water or broth. Meal planning  At meals, imagine dividing your plate into fourths: One-half of your plate is fruits and vegetables. One-fourth of your plate is whole grains. One-fourth of your plate is protein, especially lean meats, poultry, eggs, tofu, beans, or nuts. Include low-fat dairy as part of your daily diet. Lifestyle Choose healthy options in all settings, including home, work, school, restaurants, or stores. Prepare your food safely: Wash your hands after handling raw meats. Keep food preparation surfaces clean by regularly washing with hot, soapy water. Keep raw meats separate from ready-to-eat foods, such as fruits and vegetables. Cook seafood, meat, poultry, and eggs to the recommended internal temperature. Store foods at safe temperatures. In general: Keep cold foods at 35F (4.4C) or below. Keep hot foods at 135F (60C) or above. Keep your freezer at [facility] (-17.8C) or below. Foods are no longer safe to eat when they have been between the temperatures of 40-135F (4.4-60C) for more than 2 hours. What foods should I eat? Fruits Aim to eat 2 cup-equivalents of fresh, canned (in natural juice), or frozen fruits each day. Examples of 1 cup-equivalent of fruit include 1 small apple, 8 large strawberries, 1 cup canned fruit,  cup dried fruit, or 1 cup 100% juice. Vegetables Aim to eat 2-3 cup-equivalents of fresh and frozen vegetables  each day, including different varieties and colors. Examples of 1 cup-equivalent of vegetables include 2 medium carrots, 2 cups raw, leafy  greens, 1 cup chopped vegetable (raw or cooked), or 1 medium baked potato. Grains Aim to eat 6 ounce-equivalents of whole grains each day. Examples of 1 ounce-equivalent of grains include 1 slice of bread, 1 cup ready-to-eat cereal, 3 cups popcorn, or  cup cooked rice, pasta, or cereal. Meats and other proteins Aim to eat 5-6 ounce-equivalents of protein each day. Examples of 1 ounce-equivalent of protein include 1 egg, 1/2 cup nuts or seeds, or 1 tablespoon (16 g) peanut butter. A cut of meat or fish that is the size of a deck of cards is about 3-4 ounce-equivalents. Of the protein you eat each week, try to have at least 8 ounces come from seafood. This includes salmon, trout, herring, and anchovies. Dairy Aim to eat 3 cup-equivalents of fat-free or low-fat dairy each day. Examples of 1 cup-equivalent of dairy include 1 cup (240 mL) milk, 8 ounces (250 g) yogurt, 1 ounces (44 g) natural cheese, or 1 cup (240 mL) fortified soy milk. Fats and oils Aim for about 5 teaspoons (21 g) per day. Choose monounsaturated fats, such as canola and olive oils, avocados, peanut butter, and most nuts, or polyunsaturated fats, such as sunflower, corn, and soybean oils, walnuts, pine nuts, sesame seeds, sunflower seeds, and flaxseed. Beverages Aim for six 8-oz glasses of water per day. Limit coffee to three to five 8-oz cups per day. Limit caffeinated beverages that have added calories, such as soda and energy drinks. Limit alcohol intake to no more than 1 drink a day for nonpregnant women and 2 drinks a day for men. One drink equals 12 oz of beer (355 mL), 5 oz of wine (148 mL), or 1 oz of hard liquor (44 mL). Seasoning and other foods Avoid adding excess amounts of salt to your foods. Try flavoring foods with herbs and spices instead of salt. Avoid adding sugar to foods. Try using oil-based dressings, sauces, and spreads instead of solid fats. This information is based on general [place] nutrition guidelines. For  more information, visit BuildDNA.es. Exact amounts may vary based on your nutrition needs. Summary A healthy eating plan may help you to maintain a healthy weight, reduce the risk of chronic diseases, and stay active throughout your life. Plan your meals. Make sure you eat the right portions of a variety of nutrient-rich foods. Try baking, boiling, grilling, or broiling instead of frying. Choose healthy options in all settings, including home, work, school, restaurants, or stores. This information is not intended to replace advice given to you by your health care provider. Make sure you discuss any questions you have with your health care provider. Document Revised: 10/23/2021 Document Reviewed: 01/08/2021 Elsevier Patient Education  [place].

## 2022-06-02 DIAGNOSIS — M5451 Vertebrogenic low back pain: Secondary | ICD-10-CM | POA: Diagnosis not present

## 2022-06-02 DIAGNOSIS — S32010D Wedge compression fracture of first lumbar vertebra, subsequent encounter for fracture with routine healing: Secondary | ICD-10-CM | POA: Diagnosis not present

## 2022-06-03 ENCOUNTER — Ambulatory Visit (INDEPENDENT_AMBULATORY_CARE_PROVIDER_SITE_OTHER): Payer: Medicare Other | Admitting: Allergy & Immunology

## 2022-06-03 ENCOUNTER — Encounter: Payer: Self-pay | Admitting: Allergy & Immunology

## 2022-06-03 ENCOUNTER — Other Ambulatory Visit: Payer: Self-pay

## 2022-06-03 VITALS — BP 130/70 | HR 81 | Temp 97.6°F | Resp 20 | Ht 68.0 in | Wt 296.0 lb

## 2022-06-03 DIAGNOSIS — J302 Other seasonal allergic rhinitis: Secondary | ICD-10-CM

## 2022-06-03 DIAGNOSIS — J3089 Other allergic rhinitis: Secondary | ICD-10-CM

## 2022-06-03 DIAGNOSIS — J4540 Moderate persistent asthma, uncomplicated: Secondary | ICD-10-CM | POA: Diagnosis not present

## 2022-06-03 NOTE — Patient Instructions (Addendum)
1. Moderate persistent asthma, uncomplicated - Lung testing looked great today. - We are not going to make any changes today. - Daily controller medication(s): Symbicort 160/4.16mg two puffs twice daily with spacer - Prior to physical activity: albuterol 2 puffs 10-15 minutes before physical activity. - Rescue medications: albuterol 4 puffs every 4-6 hours as needed - Asthma control goals:  * Full participation in all desired activities (may need albuterol before activity) * Albuterol use two time or less a week on average (not counting use with activity) * Cough interfering with sleep two time or less a month * Oral steroids no more than once a year * No hospitalizations  2. Perennial allergic rhinitis (grasses, ragweed, outdoor molds, dust mites, cockroach) - We are going to remix your allergy shots to include the molds and grasses. - This will make you get two injections and we are going to have to start at the beginning again.  - Continue with cetirizine 10 mg daily. - Continue with Flonase 1 to 2 sprays per nostril ONCE DAILY.  - Continue with Astelin 1 to 2 sprays per nostril ONCE DAILY.   3. Follow up as scheduled.     Please inform [place] any Emergency Department visits, hospitalizations, or changes in symptoms. Call u[place]abefore going to the ED for breathing or allergy symptoms since we might be able to fit you in for a sick visit. Feel free to contact u[place]anytime with any questions, problems, or concerns.  It was a pleasure to see you again today!  Websites that have reliable patient information: 1. American Academy of Asthma, Allergy, and Immunology: www.aaaai.org 2. Food Allergy Research and Education (FARE): foodallergy.org 3. Mothers of Asthmatics: http://www.asthmacommunitynetwork.org 4. American College of Allergy, Asthma, and Immunology: www.acaai.org   COVID-19 Vaccine Information can be found at:  [email or web address] questions related to vaccine distribution or appointments, please email [email or web address] or call [phone]   We realize that you might be concerned about having an allergic reaction to the COVID19 vaccines. To help with that concern, WE ARE OFFERING THE COVID19 VACCINES IN OUR OFFICE! Ask the front desk for dates!     "Like" [facility] Facebook and Instagram for our latest updates!      A healthy democracy works best when ANew York Life Insuranceparticipate! Make sure you are registered to vote! If you have moved or changed any of your contact information, you will need to get this updated before voting!  In some cases, you MAY be able to register to vote online: [email or web address]

## 2022-06-03 NOTE — Progress Notes (Incomplete)
FOLLOW UP  Date of Service/Encounter:  06/03/22   Assessment:   Moderate persistent asthma - did not do spirometry today per patient request   Perennial and seasonal allergic rhinitis (grasses,  ragweed, outdoor molds, dust mites, cockroach)   Currently on allergen immunotherapy for dust mites, cockroach, ragweed - on allergen immunotherapy with maintenance reached August 2020   Complex medical history   Fully vaccinated for [facility], including the latest bivalent booster   Recently experienced fainting episode - unclear etiology    Plan/Recommendations:    There are no Patient Instructions on file for this visit.   Subjective:   Cheryl [name] is a 67 y.o. female presenting today for follow up of  Chief Complaint  Patient presents with  . Asthma    3 mth f/u -   . Follow-up    Follow up just getiing over covid and sinus infection pt states her asthma has been good    Cheryl [name] has a history of the following: Patient Active Problem List   Diagnosis Date Noted  . Hyperlipidemia associated with type 2 diabetes mellitus ([place]) 01/25/2021  . Type 2 diabetes mellitus with morbid obesity ([place]) 01/25/2021  . Moderate persistent asthma, uncomplicated 40/98/1191  . OSA (obstructive sleep apnea) 12/02/2016  . Hyperlipidemia LDL goal <100 10/21/2014  . Essential hypertension, benign 10/21/2014  . DM w/o complication type II ([place]) 09/13/2014  . Routine general medical examination at a health care facility 10/12/2013  . 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 05/03/2013  . Need for prophylactic vaccination and inoculation against influenza 02/08/2013  . GERD (gastroesophageal reflux disease) 02/08/2013  . Other and unspecified hyperlipidemia 11/10/2012  . Type II or unspecified type diabetes mellitus without mention of complication, not stated as uncontrolled 10/25/2012  . Impaired fasting glucose 09/07/2012  . Morbid obesity ([place]) 09/07/2012  . Generalized  osteoarthrosis, involving multiple sites 09/07/2012  . Carpal tunnel syndrome 09/07/2012  . Other, multiple, and unspecified sites, insect bite, nonvenomous, without mention of infection(919.4) 09/07/2012  . Special screening for malignant neoplasms, colon 09/07/2012  . Other specified cardiac dysrhythmias(427.89) 09/07/2012  . Candidiasis of vulva and vagina 09/07/2012  . Abdominal pain, generalized 09/07/2012  . Symptomatic menopausal or female climacteric states 09/07/2012  . Hypertension associated with diabetes ([place]) 09/07/2012  . Perennial allergic rhinitis 09/07/2012  . Pain in joint, site unspecified 09/07/2012  . Other malaise and fatigue 09/07/2012    History obtained from: chart review and patient.  Cheryl [name] is a 67 y.o. female presenting for a follow up visit.  In the interim, she was seen in the ED at the end of December 2023 for [place]. She also had a sinus infection.   {Blank single:19197::"Asthma/Respiratory Symptom History: ***"," "}  {Blank single:19197::"Allergic Rhinitis Symptom History: ***"," "}  {Blank single:19197::"Food Allergy Symptom History: ***"," "}  {Blank single:19197::"Skin Symptom History: ***"," "}  {Blank single:19197::"GERD Symptom History: ***"," "}  Otherwise, there have been no changes to her past medical history, surgical history, family history, or social history.    ROS     Objective:   Blood pressure 130/70, pulse 81, temperature 97.6 F (36.4 C), resp. rate 20, height '5\' 8"'$  (1.727 m), weight 296 lb (134.3 kg), SpO2 96 %. Body mass index is 45.01 kg/m.    Physical Exam   Diagnostic studies: {Blank single:19197::"none","deferred due to recent antihistamine use","labs sent instead"," "}  Spirometry: {Blank single:19197::"results normal (FEV1: ***%, FVC: ***%, FEV1/FVC: ***%)","results abnormal (FEV1: ***%, FVC: ***%, FEV1/FVC: ***%)"}.    {  Blank single:19197::"Spirometry consistent with mild obstructive disease","Spirometry  consistent with moderate obstructive disease","Spirometry consistent with severe obstructive disease","Spirometry consistent with possible restrictive disease","Spirometry consistent with mixed obstructive and restrictive disease","Spirometry uninterpretable due to technique","Spirometry consistent with normal pattern"}. {Blank single:19197::"Albuterol/Atrovent nebulizer","Xopenex/Atrovent nebulizer","Albuterol nebulizer","Albuterol four puffs via MDI","Xopenex four puffs via MDI"} treatment given in clinic with {Blank single:19197::"significant improvement in FEV1 per ATS criteria","significant improvement in FVC per ATS criteria","significant improvement in FEV1 and FVC per ATS criteria","improvement in FEV1, but not significant per ATS criteria","improvement in FVC, but not significant per ATS criteria","improvement in FEV1 and FVC, but not significant per ATS criteria","no improvement"}.  Allergy Studies: {Blank single:19197::"none","labs sent instead"," "}    {Blank single:19197::"Allergy testing results were read and interpreted by myself, documented by clinical staff."," "}      Salvatore Marvel, MD  Allergy and [place] [facility]

## 2022-06-03 NOTE — Progress Notes (Unsigned)
FOLLOW UP  Date of Service/Encounter:  06/03/22   Assessment:   Moderate persistent asthma, uncomplicated  Seasonal and perennial allergic rhinitis (grasses, ragweed, outdoor molds, dust mites, cockroach) - rewriting allergen immunotherapy prescription to include grasses and outdoor molds  Plan/Recommendations:   1. Moderate persistent asthma, uncomplicated - Lung testing looked great today. - We are not going to make any changes today. - Daily controller medication(s): Symbicort 160/4.54mg two puffs twice daily with spacer - Prior to physical activity: albuterol 2 puffs 10-15 minutes before physical activity. - Rescue medications: albuterol 4 puffs every 4-6 hours as needed - Asthma control goals:  * Full participation in all desired activities (may need albuterol before activity) * Albuterol use two time or less a week on average (not counting use with activity) * Cough interfering with sleep two time or less a month * Oral steroids no more than once a year * No hospitalizations  2. Perennial allergic rhinitis (grasses, ragweed, outdoor molds, dust mites, cockroach) - We are going to remix your allergy shots to include the molds and grasses. - This will make you get two injections and we are going to have to start at the beginning again.  - Continue with cetirizine 10 mg daily. - Continue with Flonase 1 to 2 sprays per nostril ONCE DAILY.  - Continue with Astelin 1 to 2 sprays per nostril ONCE DAILY.   3. Follow up as scheduled.     Subjective:   Cheryl CTESHA ARCHAMBEAUis a 67y.o. female presenting today for follow up of  Chief Complaint  Patient presents with   Asthma    3 mth f/u -    Follow-up    Follow up just getiing over covid and sinus infection pt states her asthma has been good    Cheryl C MSmarrhas a history of the following: Patient Active Problem List   Diagnosis Date Noted   Hyperlipidemia associated with type 2 diabetes mellitus ([place] 01/25/2021   Type 2  diabetes mellitus with morbid obesity ([place] 01/25/2021   Moderate persistent asthma, uncomplicated 083/66/2947  OSA (obstructive sleep apnea) 12/02/2016   Hyperlipidemia LDL goal <100 10/21/2014   Essential hypertension, benign 065/46/5035  DM w/o complication type II ([place] 09/13/2014   Routine general medical examination at a health care facility 10/12/2013   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 05/03/2013   Need for prophylactic vaccination and inoculation against influenza 02/08/2013   GERD (gastroesophageal reflux disease) 02/08/2013   Other and unspecified hyperlipidemia 11/10/2012   Type II or unspecified type diabetes mellitus without mention of complication, not stated as uncontrolled 10/25/2012   Impaired fasting glucose 09/07/2012   Morbid obesity ([place] 09/07/2012   Generalized osteoarthrosis, involving multiple sites 09/07/2012   Carpal tunnel syndrome 09/07/2012   Other, multiple, and unspecified sites, insect bite, nonvenomous, without mention of infection(919.4) 09/07/2012   Special screening for malignant neoplasms, colon 09/07/2012   Other specified cardiac dysrhythmias(427.89) 09/07/2012   Candidiasis of vulva and vagina 09/07/2012   Abdominal pain, generalized 09/07/2012   Symptomatic menopausal or female climacteric states 09/07/2012   Hypertension associated with diabetes ([place] 09/07/2012   Perennial allergic rhinitis 09/07/2012   Pain in joint, site unspecified 09/07/2012   Other malaise and fatigue 09/07/2012    History obtained from: chart review and patient.  Cheryl [name] is a 67y.o. female presenting for a follow up visit.  In the interm, she contracted COVID19. She was prescribed molnupiravir for this. This is her  second time of having COVID19. She last had it Thanksgiving 2022. She is fully vaccinated. She is feeling better with the antiviral.   She fell in June 2023 from blacking out. She has had a lot of back pain which is thought to be arthritis. She  was having some back trouble before this as she had cortisone injections four years ago. It seems to be getting better. She is trying to do all of the physical therapy and moving more.   Asthma/Respiratory Symptom History: She does get out of breath occasionally when she walks and does exercises.  She is on Symbicort two puffs BID. She has been on a CPAP since 2018. She has not had a sinus infection since that time.  Allergic Rhinitis Symptom History: She is on allergy shots. She did have the sinus infection following her COVID19 infection, treated with Augmentin. She cannot remember the last time that she was sick with a sinus infection.  We did do testing on the last visit which started grasses and outdoor molds.  However, since she was stable, we decided to hold off on adding this to her regimen.  She now thinks that she has worsening symptoms when she is outside, so she is open to really starting her allergy shots completely to include these allergens.  Cheryl [name] is on allergen immunotherapy. She receives one injection. Immunotherapy script #1 contains ragweed, dust mites and cockroach. She currently receives 0.47m of the RED vial (1/100). She started shots March of 2020 and reached maintenance in August of 2020. She has had no large local reactions. She is getting shots every 4 weeks. She is doing very well with her allergy shots and not having any large local reactions.  She did a lot of traveling in 2023.  She seems to have better relief of time.  However, her family is not letting [place] travel at all too far [place] the [place]  This is because of her [place]ing [place]  Her family just does not want her traveling very far because of that.  She has 2 sons who seem to dote on her. [place]  Otherwise, there have been no changes to her past medical history, surgical history,  family history, or social history.    Review of Systems  Constitutional: Negative.  Negative for chills, fever, malaise/fatigue and weight loss.  HENT: Negative.  Negative for congestion, ear discharge, ear pain and sinus pain.   Eyes:  Negative for pain, discharge and redness.  Respiratory:  Negative for cough, sputum production, shortness of breath, wheezing and stridor.   Cardiovascular: Negative.  Negative for chest pain and palpitations.  Gastrointestinal:  Negative for abdominal pain, constipation, diarrhea, heartburn, nausea and vomiting.  Skin: Negative.  Negative for itching and rash.  Neurological:  Negative for dizziness and headaches.  Endo/Heme/Allergies:  Negative for environmental allergies. Does not bruise/bleed easily.       Objective:   Blood pressure 130/70, pulse 81, temperature 97.6 F (36.4 C), resp. rate 20, height '5\' 8"'$  (1.727 m), weight 296 lb (134.3 kg), SpO2 96 %. Body mass index is 45.01 kg/m.    Physical Exam Vitals reviewed.  Constitutional:      Appearance: She is well-developed.     Comments: Boisterous and interactive.  Cooperative with exam.  HENT:     Head: Normocephalic and atraumatic.  Right Ear: Tympanic membrane, ear canal and external ear normal.     Left Ear: Tympanic membrane, ear canal and external ear normal.     Nose: No nasal deformity, septal deviation, mucosal edema or rhinorrhea.     Right Turbinates: Enlarged and swollen.     Left Turbinates: Enlarged and swollen.     Right Sinus: No maxillary sinus tenderness or frontal sinus tenderness.     Left Sinus: No maxillary sinus tenderness or frontal sinus tenderness.     Comments: Clear rhinorrhea.     Mouth/Throat:     Mouth: Mucous membranes are not pale and not dry.     Pharynx: Uvula midline.  Eyes:     General: Lids are normal. Allergic shiner present.        Right eye: No discharge.        Left eye: No discharge.     Conjunctiva/sclera: Conjunctivae normal.      Right eye: Right conjunctiva is not injected. No chemosis.    Left eye: Left conjunctiva is not injected. No chemosis.    Pupils: Pupils are equal, round, and reactive to light.  Cardiovascular:     Rate and Rhythm: Normal rate and regular rhythm.     Heart sounds: Normal heart sounds.  Pulmonary:     Effort: Pulmonary effort is normal. No tachypnea, accessory muscle usage or respiratory distress.     Breath sounds: Normal breath sounds. No wheezing, rhonchi or rales.     Comments: Moving air well in all lung fields.  No increased work of breathing. Chest:     Chest wall: No tenderness.  Lymphadenopathy:     Cervical: No cervical adenopathy.  Skin:    Coloration: Skin is not pale.     Findings: No abrasion, erythema, petechiae or rash. Rash is not papular, urticarial or vesicular.  Neurological:     Mental Status: She is alert.  Psychiatric:        Behavior: Behavior is cooperative.      Diagnostic studies:    Spirometry: results normal (FEV1: 1.83/83%, FVC: 2.32/82%, FEV1/FVC: 79%).    Spirometry consistent with normal pattern.    Allergy Studies: none        Salvatore Marvel, MD  All[facility] and [place] [facility] [place]

## 2022-06-04 DIAGNOSIS — S32010D Wedge compression fracture of first lumbar vertebra, subsequent encounter for fracture with routine healing: Secondary | ICD-10-CM | POA: Diagnosis not present

## 2022-06-04 DIAGNOSIS — M5451 Vertebrogenic low back pain: Secondary | ICD-10-CM | POA: Diagnosis not present

## 2022-06-04 DIAGNOSIS — J302 Other seasonal allergic rhinitis: Secondary | ICD-10-CM

## 2022-06-04 MED ORDER — FLUTICASONE PROPIONATE 50 MCG/ACT NA SUSP: 1.0000 | Freq: Every day | NASAL | 2 refills | Status: DC

## 2022-06-04 MED ORDER — ALBUTEROL SULFATE HFA 108 (90 BASE) MCG/ACT IN AERS: INHALATION_SPRAY | RESPIRATORY_TRACT | 1 refills | Status: DC

## 2022-06-04 MED ORDER — AZELASTINE HCL 0.1 % NA SOLN: 2.0000 | Freq: Two times a day (BID) | NASAL | 5 refills | Status: DC

## 2022-06-04 MED ORDER — BUDESONIDE-FORMOTEROL FUMARATE 160-4.5 MCG/ACT IN AERO: INHALATION_SPRAY | RESPIRATORY_TRACT | 5 refills | Status: DC

## 2022-06-04 NOTE — Progress Notes (Signed)
NEW VIALS-RX EXP 06-05-23

## 2022-06-04 NOTE — Progress Notes (Signed)
Aeroallergen Immunotherapy   Ordering Provider: Dr. Salvatore Marvel   Patient Details  Name: Cheryl [name]  MRN: [id number]  Date of Birth: July 13, 1955   Order 2 of 2   Vial Label: G/RW/DM   0.3 ml (Volume)  BAU Concentration -- 7 Grass Mix* 100,000 ([address] [place], [place], [place], Perennial [place], [place], Sweet [place], [place])  0.3 ml (Volume)  BAU Concentration -- [place] 10,000  0.4 ml (Volume)  1:20 Concentration -- Ragweed Mix  0.7 ml (Volume)   AU Concentration -- Mite Mix (DF 5,000 & DP 5,000)    1.7  ml Extract Subtotal  3.3  ml Diluent  5.0  ml Maintenance Total   Schedule:  B   Blue Vial (1:100,000): Schedule B (6 doses)  Yellow Vial (1:10,000): Schedule B (6 doses)  Green Vial (1:1,000): Schedule B (6 doses)  Red Vial (1:100): Schedule A (14 doses)   Special Instructions: Ok to come twice weekly, if desired.

## 2022-06-04 NOTE — Progress Notes (Signed)
Aeroallergen Immunotherapy   Ordering Provider: Dr. Salvatore Marvel   Patient Details  Name: Cheryl [name]  MRN: [id number]  Date of Birth: 01/22/1956   Order 1 of 2   Vial Label: Molds/CR   0.2 ml (Volume)  1:20 Concentration -- Alternaria alternata  0.2 ml (Volume)  1:20 Concentration -- Cladosporium herbarum  0.2 ml (Volume)  1:20 Concentration -- Drechslera spicifera  0.2 ml (Volume)  1:10 Concentration -- Mucor plumbeus  0.2 ml (Volume)  1:10 Concentration -- Fusarium moniliforme  0.3 ml (Volume)  1:20 Concentration -- Cockroach, [place]    1.3  ml Extract Subtotal  3.7  ml Diluent  5.0  ml Maintenance Total   Schedule:  B   Blue Vial (1:100,000): Schedule B (6 doses)  Yellow Vial (1:10,000): Schedule B (6 doses)  Green Vial (1:1,000): Schedule B (6 doses)  Red Vial (1:100): Schedule A (14 doses)   Special Instructions: Ok to come twice weekly, if desired.

## 2022-06-05 DIAGNOSIS — J3089 Other allergic rhinitis: Secondary | ICD-10-CM

## 2022-06-10 DIAGNOSIS — M5451 Vertebrogenic low back pain: Secondary | ICD-10-CM | POA: Diagnosis not present

## 2022-06-10 DIAGNOSIS — S32010D Wedge compression fracture of first lumbar vertebra, subsequent encounter for fracture with routine healing: Secondary | ICD-10-CM | POA: Diagnosis not present

## 2022-06-11 ENCOUNTER — Other Ambulatory Visit: Payer: Self-pay | Admitting: Internal Medicine

## 2022-06-11 NOTE — Telephone Encounter (Signed)
Requested Prescriptions  Pending Prescriptions Disp Refills   triamterene-hydrochlorothiazide (MAXZIDE) 75-50 MG tablet [Pharmacy Med Name: TRIAMTERENE-HCTZ 75-50 MG TAB] 90 tablet 0    Sig: TAKE 1 TABLET BY MOUTH EVERY DAY     Cardiovascular: Diuretic Combos Failed - 06/11/2022  3:27 PM      Failed - K in normal range and within 180 days    Potassium  Date Value Ref Range Status  12/12/2021 4.4 3.5 - 5.2 mmol/L Final         Failed - Na in normal range and within 180 days    Sodium  Date Value Ref Range Status  12/12/2021 143 134 - 144 mmol/L Final         Failed - Cr in normal range and within 180 days    Creat  Date Value Ref Range Status  08/17/2018 0.83 0.50 - 0.99 mg/dL Final    Comment:    For patients >77 years of age, the reference limit for Creatinine is approximately 13% higher for people identified as African-American. .    Creatinine, Ser  Date Value Ref Range Status  12/12/2021 0.97 0.57 - 1.00 mg/dL Final   Creatinine, Urine  Date Value Ref Range Status  07/27/2018 54 20 - 275 mg/dL Final         Passed - Last BP in normal range    BP Readings from Last 1 Encounters:  06/03/22 130/70         Passed - Valid encounter within last 6 months    Recent Outpatient Visits           1 week ago Type 2 diabetes mellitus with morbid obesity ([place])   [place] Karle Plumber B, MD   4 months ago Type 2 diabetes mellitus with morbid obesity [facility])   [place], MD   6 months ago Syncope and collapse   [place], MD   8 months ago Type 2 diabetes mellitus with morbid obesity [facility])   [place], MD   9 months ago Dermatitis   [facility] at [facility], Loraine Grip, PA-C       Future Appointments             In 2 months Ernst Bowler Gwenith Daily, MD Allergy and [place]   In 3 months Ladell Pier, MD [place]   In 4 months Young, Kasandra Knudsen, MD [facility]   In 8 months Young, Kasandra Knudsen, MD [facility]monary Care

## 2022-06-12 DIAGNOSIS — S32010D Wedge compression fracture of first lumbar vertebra, subsequent encounter for fracture with routine healing: Secondary | ICD-10-CM | POA: Diagnosis not present

## 2022-06-12 DIAGNOSIS — M5451 Vertebrogenic low back pain: Secondary | ICD-10-CM | POA: Diagnosis not present

## 2022-06-16 DIAGNOSIS — M5451 Vertebrogenic low back pain: Secondary | ICD-10-CM | POA: Diagnosis not present

## 2022-06-16 DIAGNOSIS — M4316 Spondylolisthesis, lumbar region: Secondary | ICD-10-CM | POA: Diagnosis not present

## 2022-06-24 ENCOUNTER — Ambulatory Visit (INDEPENDENT_AMBULATORY_CARE_PROVIDER_SITE_OTHER): Payer: Medicare Other

## 2022-06-24 DIAGNOSIS — J309 Allergic rhinitis, unspecified: Secondary | ICD-10-CM

## 2022-06-27 ENCOUNTER — Ambulatory Visit (INDEPENDENT_AMBULATORY_CARE_PROVIDER_SITE_OTHER): Payer: Medicare Other | Admitting: *Deleted

## 2022-06-27 DIAGNOSIS — J309 Allergic rhinitis, unspecified: Secondary | ICD-10-CM

## 2022-07-01 ENCOUNTER — Ambulatory Visit (INDEPENDENT_AMBULATORY_CARE_PROVIDER_SITE_OTHER): Payer: Medicare Other

## 2022-07-01 DIAGNOSIS — J309 Allergic rhinitis, unspecified: Secondary | ICD-10-CM | POA: Diagnosis not present

## 2022-07-03 ENCOUNTER — Other Ambulatory Visit (HOSPITAL_COMMUNITY): Payer: Self-pay

## 2022-07-03 ENCOUNTER — Encounter: Payer: Medicare Other | Attending: Internal Medicine | Admitting: Dietician

## 2022-07-03 ENCOUNTER — Encounter: Payer: Self-pay | Admitting: Dietician

## 2022-07-03 VITALS — Wt 296.0 lb

## 2022-07-03 DIAGNOSIS — E119 Type 2 diabetes mellitus without complications: Secondary | ICD-10-CM | POA: Diagnosis not present

## 2022-07-03 NOTE — Patient Instructions (Addendum)
Great job on being more active! Consider checking your blood glucose 1-2 times per day. Make your decisions in the grocery store  Find ways to add vegetable to your day.    Raw vegetables  Salad  Steamed or vegetables  Vegetable soup  Reduce processed foods particularly meat   Mindfulness:             Consistently scheduled meal - avoid skipping             Choices             Eat slowly             Away from distraction (sitting in kitchen or dining room)             Stop eating when satisfied             Before a snack ask, "Am I hungry or eating for another reason?"                         "What can I do instead if I am not hungry?"   Try to find something every day that brings you joy!

## 2022-07-03 NOTE — Progress Notes (Signed)
Diabetes Self-Management Education  Visit Type: Follow-up  Appt. Start Time: 1500 Appt. End Time: 1540  07/03/2022  [name], identified by name and date of birth, is a 67 y.o. female with a diagnosis of Diabetes:  .   ASSESSMENT Patient is here today alone.  She was last seen by this RD 03/31/2022.  She has exercises to do daily for her back.  She states that she has not been having increased pain.  Any pain improves with exercise. Walking about 4 times per week for 30 minutes. Still working on trying to get off caffeine. She enjoys going to the [employer] and enjoys their chocolate milk and has been trying to drink this or zero gatorade rather than caffeine containing drinks.   Drinks 1/2 gallon of water daily. Checks blood glucose twice daily - 98 fasting, 120 after a meal.   History includes HTN, OSA on C-pap, glaucoma, type 2 diabetes "for years".   Labs noted to include:  A1C 6.1% per patient 03/2022   Weight hx: 68" 296 lbs 07/03/2022 296 lbs 03/31/2022 288 lbs 12/30/2021 290 lbs 2020 Lost 40 lbs in the past but regained after her mother's death.   Patient lives alone.  She is retired from [employer]. She owns a stationary bike and treadmill but does not like this. Walking most days of the week and doing PT exercises daily. Her son is vegetarian.  She does not eat red meat. Weight 296 lb (134.3 kg). Body mass index is 45.01 kg/m.   Diabetes Self-Management Education - 07/03/22 1600       Visit Information   Visit Type Follow-up      Pre-Education Assessment   Patient understands the diabetes disease and treatment process. Comprehends key points    Patient understands incorporating nutritional management into lifestyle. Needs Review    Patient undertands incorporating physical activity into lifestyle. Demonstrates understanding / competency    Patient understands using medications safely. Demonstrates understanding / competency    Patient understands  monitoring blood glucose, interpreting and using results Demonstrates understanding / competency    Patient understands prevention, detection, and treatment of acute complications. Demonstrates understanding / competency    Patient understands prevention, detection, and treatment of chronic complications. Demonstrates understanding / competency    Patient understands how to develop strategies to address psychosocial issues. Demonstrates understanding / competency    Patient understands how to develop strategies to promote health/change behavior. Demonstrates understanding / competency      Dietary Intake   Breakfast greek yogurt, pecans, granola   8-10 am   Lunch 2 hot dogs on buns with chili, slaw    Dinner spam, mac and cheese, cabbage   3-6 pm   Snack (evening) occasional popcorn or low carb ice cream    Beverage(s) water, zerogatorade, chocolate milk      Activity / Exercise   Activity / Exercise Type Light (walking / raking leaves)    How many days per week do you exercise? 7    How many minutes per day do you exercise? 30    Total minutes per week of exercise 210      Patient Education   Previous Diabetes Education Yes (please comment)   11/23   Healthy Eating Meal options for control of blood glucose level and chronic complications.;Plate Method;Other (comment)   calorie density   Monitoring Taught/evaluated SMBG meter.    Diabetes Stress and Support Worked with patient to identify barriers to care and solutions  Individualized Goals (developed by patient)   Nutrition Follow meal plan discussed    Physical Activity Exercise 5-7 days per week;30 minutes per day    Medications take my medication as prescribed    Monitoring  Test my blood glucose as discussed    Problem Solving Eating Pattern;Addressing barriers to behavior change    Reducing Risk examine blood glucose patterns;do foot checks daily      Patient Self-Evaluation of Goals - Patient rates self as meeting  previously set goals (% of time)   Nutrition 50 - 75 % (half of the time)    Physical Activity >75% (most of the time)    Medications Not Applicable    Monitoring >75% (most of the time)    Problem Solving and behavior change strategies  50 - 75 % (half of the time)    Reducing Risk (treating acute and chronic complications) 50 - 75 % (half of the time)    Health Coping 50 - 75 % (half of the time)      Post-Education Assessment   Patient understands the diabetes disease and treatment process. Demonstrates understanding / competency    Patient understands incorporating nutritional management into lifestyle. Comprehends key points    Patient undertands incorporating physical activity into lifestyle. Comprehends key points    Patient understands using medications safely. Comphrehends key points    Patient understands monitoring blood glucose, interpreting and using results Demonstrates understanding / competency    Patient understands prevention, detection, and treatment of acute complications. Comprehends key points    Patient understands prevention, detection, and treatment of chronic complications. Comprehends key points    Patient understands how to develop strategies to address psychosocial issues. Comprehends key points    Patient understands how to develop strategies to promote health/change behavior. Needs Review      Outcomes   Expected Outcomes Demonstrated interest in learning but significant barriers to change    Future DMSE 3-4 months    Program Status Not Completed      Subsequent Visit   Since your last visit have you experienced any weight changes? No change             Individualized Plan for Diabetes Self-Management Training:   Learning Objective:  Patient will have a greater understanding of diabetes self-management. Patient education plan is to attend individual and/or group sessions per assessed needs and concerns.   Plan:   Patient Instructions  [employer] job  on being more active! Consider checking your blood glucose 1-2 times per day. Make your decisions in the grocery store  Find ways to add vegetable to your day.    Raw vegetables  Salad  Steamed or vegetables  Vegetable soup  Reduce processed foods particularly meat   Mindfulness:             Consistently scheduled meal - avoid skipping             Choices             Eat slowly             Away from distraction (sitting in kitchen or dining room)             Stop eating when satisfied             Before a snack ask, "Am I hungry or eating for another reason?"                         "  What can I do instead if I am not hungry?"   Try to find something every day that brings you joy!  Expected Outcomes:  Demonstrated interest in learning but significant barriers to change  Education material provided:   If problems or questions, patient to contact team via:  Phone  Future DSME appointment: 3-4 months

## 2022-07-04 ENCOUNTER — Other Ambulatory Visit (HOSPITAL_COMMUNITY): Payer: Self-pay

## 2022-07-04 ENCOUNTER — Ambulatory Visit (INDEPENDENT_AMBULATORY_CARE_PROVIDER_SITE_OTHER): Payer: Medicare Other | Admitting: *Deleted

## 2022-07-04 DIAGNOSIS — J309 Allergic rhinitis, unspecified: Secondary | ICD-10-CM | POA: Diagnosis not present

## 2022-07-08 ENCOUNTER — Other Ambulatory Visit (HOSPITAL_COMMUNITY): Payer: Self-pay

## 2022-07-08 ENCOUNTER — Ambulatory Visit (INDEPENDENT_AMBULATORY_CARE_PROVIDER_SITE_OTHER): Payer: Medicare Other

## 2022-07-08 DIAGNOSIS — J309 Allergic rhinitis, unspecified: Secondary | ICD-10-CM

## 2022-07-11 ENCOUNTER — Ambulatory Visit
Admission: RE | Admit: 2022-07-11 | Discharge: 2022-07-11 | Disposition: A | Discharge status: Home / Self Care | Payer: Medicare Other | Source: Ambulatory Visit | Attending: Obstetrics and Gynecology | Admitting: Obstetrics and Gynecology

## 2022-07-11 ENCOUNTER — Ambulatory Visit (INDEPENDENT_AMBULATORY_CARE_PROVIDER_SITE_OTHER): Payer: Medicare Other | Admitting: *Deleted

## 2022-07-11 DIAGNOSIS — Z13820 Encounter for screening for osteoporosis: Secondary | ICD-10-CM

## 2022-07-11 DIAGNOSIS — J309 Allergic rhinitis, unspecified: Secondary | ICD-10-CM | POA: Diagnosis not present

## 2022-07-15 ENCOUNTER — Ambulatory Visit (INDEPENDENT_AMBULATORY_CARE_PROVIDER_SITE_OTHER): Payer: Medicare Other

## 2022-07-15 DIAGNOSIS — J309 Allergic rhinitis, unspecified: Secondary | ICD-10-CM | POA: Diagnosis not present

## 2022-07-17 ENCOUNTER — Ambulatory Visit (INDEPENDENT_AMBULATORY_CARE_PROVIDER_SITE_OTHER): Payer: Medicare Other

## 2022-07-17 ENCOUNTER — Other Ambulatory Visit (HOSPITAL_COMMUNITY): Payer: Self-pay

## 2022-07-17 DIAGNOSIS — L84 Corns and callosities: Secondary | ICD-10-CM | POA: Diagnosis not present

## 2022-07-17 DIAGNOSIS — E1142 Type 2 diabetes mellitus with diabetic polyneuropathy: Secondary | ICD-10-CM | POA: Diagnosis not present

## 2022-07-17 DIAGNOSIS — E1151 Type 2 diabetes mellitus with diabetic peripheral angiopathy without gangrene: Secondary | ICD-10-CM | POA: Diagnosis not present

## 2022-07-17 DIAGNOSIS — I739 Peripheral vascular disease, unspecified: Secondary | ICD-10-CM | POA: Diagnosis not present

## 2022-07-17 DIAGNOSIS — J309 Allergic rhinitis, unspecified: Secondary | ICD-10-CM

## 2022-07-17 DIAGNOSIS — L603 Nail dystrophy: Secondary | ICD-10-CM | POA: Diagnosis not present

## 2022-07-18 ENCOUNTER — Telehealth: Payer: Self-pay

## 2022-07-18 NOTE — Telephone Encounter (Signed)
PA request received via CMM for Budesonide-Formoterol Fumarate 160-4.5MCG/ACT aerosol  PA has been submitted to [facility] and DENIED.   Key: [id number]  Denial letter attached in patients documents.

## 2022-07-19 ENCOUNTER — Other Ambulatory Visit: Payer: Self-pay | Admitting: Allergy & Immunology

## 2022-07-21 ENCOUNTER — Ambulatory Visit (INDEPENDENT_AMBULATORY_CARE_PROVIDER_SITE_OTHER): Payer: Medicare Other | Admitting: *Deleted

## 2022-07-21 DIAGNOSIS — J309 Allergic rhinitis, unspecified: Secondary | ICD-10-CM | POA: Diagnosis not present

## 2022-07-21 NOTE — Telephone Encounter (Signed)
Per denial letter:  Fluticasone- Salmeterol is approved by insurance.  Forwarding message to provider for next step.

## 2022-07-22 ENCOUNTER — Ambulatory Visit: Payer: Self-pay | Admitting: *Deleted

## 2022-07-22 MED ORDER — FLUTICASONE-SALMETEROL 115-21 MCG/ACT IN AERO: 2.0000 | INHALATION_SPRAY | Freq: Two times a day (BID) | RESPIRATORY_TRACT | 5 refills | Status: DC

## 2022-07-22 NOTE — Telephone Encounter (Signed)
Patient has been notified and will try to pick up the Advair HFA inhaler on 07/23/22 from the pharmacy.

## 2022-07-22 NOTE — Telephone Encounter (Signed)
I sent in the [facility] and hoping for the best.   Salvatore Marvel, MD Allergy and [facility]

## 2022-07-22 NOTE — Telephone Encounter (Signed)
I am fine with trying that. Is this the [facility] that is approved or the [facility]?

## 2022-07-22 NOTE — Addendum Note (Signed)
Addended by: Valentina Shaggy on: 07/22/2022 03:44 PM   Modules accepted: Orders

## 2022-07-22 NOTE — Telephone Encounter (Signed)
Unfortunately the letter didn't state HFA or [facility], we could try sending in either one you prefer and see what insurance may say?

## 2022-07-23 ENCOUNTER — Ambulatory Visit (INDEPENDENT_AMBULATORY_CARE_PROVIDER_SITE_OTHER): Payer: Medicare Other

## 2022-07-23 DIAGNOSIS — J309 Allergic rhinitis, unspecified: Secondary | ICD-10-CM | POA: Diagnosis not present

## 2022-07-26 ENCOUNTER — Other Ambulatory Visit: Payer: Self-pay | Admitting: Internal Medicine

## 2022-07-29 ENCOUNTER — Ambulatory Visit (INDEPENDENT_AMBULATORY_CARE_PROVIDER_SITE_OTHER): Payer: Medicare Other

## 2022-07-29 DIAGNOSIS — J309 Allergic rhinitis, unspecified: Secondary | ICD-10-CM

## 2022-07-31 ENCOUNTER — Ambulatory Visit (INDEPENDENT_AMBULATORY_CARE_PROVIDER_SITE_OTHER): Payer: Medicare Other

## 2022-07-31 DIAGNOSIS — J309 Allergic rhinitis, unspecified: Secondary | ICD-10-CM | POA: Diagnosis not present

## 2022-08-05 ENCOUNTER — Ambulatory Visit (INDEPENDENT_AMBULATORY_CARE_PROVIDER_SITE_OTHER): Payer: Medicare Other | Admitting: Family

## 2022-08-05 ENCOUNTER — Encounter: Payer: Self-pay | Admitting: Family

## 2022-08-05 ENCOUNTER — Other Ambulatory Visit: Payer: Self-pay

## 2022-08-05 VITALS — BP 132/60 | HR 64 | Temp 97.9°F | Resp 16

## 2022-08-05 DIAGNOSIS — J019 Acute sinusitis, unspecified: Secondary | ICD-10-CM

## 2022-08-05 DIAGNOSIS — J4540 Moderate persistent asthma, uncomplicated: Secondary | ICD-10-CM

## 2022-08-05 DIAGNOSIS — J302 Other seasonal allergic rhinitis: Secondary | ICD-10-CM | POA: Diagnosis not present

## 2022-08-05 DIAGNOSIS — J3089 Other allergic rhinitis: Secondary | ICD-10-CM

## 2022-08-05 MED ORDER — AMOXICILLIN-POT CLAVULANATE 875-125 MG PO TABS: ORAL_TABLET | ORAL | 0 refills | Status: DC

## 2022-08-05 MED ORDER — SPACER/AERO-HOLDING CHAMBERS DEVI: 1.0000 | 0 refills | Status: AC | PRN

## 2022-08-05 NOTE — Patient Instructions (Addendum)
1. Moderate persistent asthma, uncomplicated Try using your albuterol for shortness of breath. Let us know if your shortness of breath does not get better - Daily controller medication(s): Continue Symbicort 160/4.5 mcg 2 puffs twice a day with spacer. Then when out switch to Advair HFA 115/21 mcg two puffs twice daily with spacer - Prior to physical activity: albuterol 2 puffs 10-15 minutes before physical activity. - Rescue medications: albuterol 4 puffs every 4-6 hours as needed - Asthma control goals:  * Full participation in all desired activities (may need albuterol before activity) * Albuterol use two time or less a week on average (not counting use with activity) * Cough interfering with sleep two time or less a month * Oral steroids no more than once a year * No hospitalizations  2. Perennial allergic rhinitis (grasses, ragweed, outdoor molds, dust mites, cockroach) Continue allergy injections per protocol and have access to your epinephrine auto injector device  - Continue with cetirizine 10 mg daily. - Continue with Flonase 1 to 2 sprays per nostril ONCE DAILY.  - Continue with Astelin 1 to 2 sprays per nostril ONCE DAILY.   3. Acute sinus infection Start Augmentin 875 mg taking 1 tablet twice a day for 7 days If you continue to have sore throat recommend going to urgent care or primary care physician to get checked for strep throat Verbally discussed not getting allergy injections while taking Augmentin Let us know if you develop a fever  Your blood pressure was elevated today while in the office today. Please schedule an appointment with her primary care physician 3. Keep your already scheduled follow up appointment on 08/26/22 @ 3 PM with Dr. Ernst Bowler

## 2022-08-05 NOTE — Progress Notes (Signed)
[name] [address] Dept: [phone]  FOLLOW UP NOTE  Patient ID: Cheryl [name], female    DOB: [birth year]  Age: 67 y.o. MRN: [id number] Date of Office Visit: 08/05/2022  Assessment  Chief Complaint: Nasal Congestion, Fatigue, Chills, and Sore Throat  HPI Cheryl [name] is a 67 year old female who presents today for an acute visit of possible sinus infection.  She was last seen on June 03, 2022 by Dr. Ernst Bowler for moderate persistent asthma and seasonal and perennial allergic rhinitis.  She denies any new diagnosis or surgery since her last office visit.  Perennial and seasonal allergic rhinitis: She reports for the past week she has felt tired, sore/scratchy throat, postnasal drip, nasal congestion, chills and then hot, and queasiness of the stomach.  She denies fever and reports no more than usual body aches.  She does not know of any known sick contacts.  She has not checked for COVID or strep throat.  She is currently receiving allergy injections per protocol and taking cetirizine 10 mg daily, Flonase daily and Astelin nasal spray daily.  She has not been treated for any other sinus infections since her last office visit.  She thinks her last sinus infection was in December when she had COVID-19 also.  Moderate persistent asthma: She reports that this morning she was short of breath, but did not use her albuterol inhaler.  She denies cough, wheeze, tightness in chest, and nocturnal   awakenings due to breathing problems.  Since her last office visit she has not required any systemic steroids or made any trips to the emergency room or urgent care due to breathing problems.  She reports very seldom use of her albuterol inhaler.  She is currently taking Symbicort 160/4.5 mcg 2 puffs twice a day with a spacer.  She has not picked up the Advair that was prescribed recently yet.   Drug Allergies:  Allergies  Allergen Reactions   Misc. Sulfonamide Containing Compounds     Other  reaction(s): Not available   Elemental Sulfur Diarrhea   Oxycodone     Had stomach and headache as side effect from medicine    Review of Systems: Review of Systems  Constitutional:  Positive for chills. Negative for fever.       Reports feeling chills at times and then feeling hot for a while now. Also, reports feeling tired. Denies known sick contacts  HENT:  Positive for congestion and sore throat.        Reports sore throat/scratchy throat, nasal congestion, postnasal drip.  Denies rhinorrhea  Eyes:        Reports itchy eyes at times  Respiratory:  Positive for shortness of breath. Negative for cough and wheezing.        Reports shortness of breath this morning denies cough, wheeze, tightness in chest, and nocturnal awakenings due to breathing problems  Cardiovascular:  Negative for chest pain and palpitations.  Gastrointestinal:        Reports queasy stomach.  Denies heartburn or reflux symptoms  Musculoskeletal:        Reports body aches are no more than usual  Skin:  Negative for itching and rash.  Neurological:  Positive for headaches.  Endo/Heme/Allergies:  Positive for environmental allergies.     Physical Exam: BP 132/60 (BP Location: Right Arm, Patient Position: Sitting)   Pulse 64   Temp 97.9 F (36.6 C) (Temporal)   Resp 16   SpO2 97%    Physical Exam  Constitutional:      Appearance: She is well-developed.  HENT:     Head: Normocephalic and atraumatic.     Comments: Pharynx normal, eyes normal, ears normal. Nose: bilateral lower turbinates moderately edematous and slightly erythematous with no drainage noted.    Right Ear: Tympanic membrane, ear canal and external ear normal.     Left Ear: Tympanic membrane, ear canal and external ear normal.     Mouth/Throat:     Mouth: Mucous membranes are moist.     Pharynx: Oropharynx is clear.  Eyes:     Conjunctiva/sclera: Conjunctivae normal.  Cardiovascular:     Rate and Rhythm: Normal rate and regular rhythm.      Heart sounds: Normal heart sounds.  Pulmonary:     Effort: Pulmonary effort is normal.     Breath sounds: Normal breath sounds.     Comments: Lungs clear to auscultation Musculoskeletal:     Cervical back: Neck supple.  Skin:    General: Skin is warm.  Neurological:     Mental Status: She is alert and oriented to person, place, and time.  Psychiatric:        Mood and Affect: Mood normal.        Behavior: Behavior normal.        Thought Content: Thought content normal.        Judgment: Judgment normal.     Diagnostics:  None  Assessment and Plan: 1. Acute non-recurrent sinusitis, unspecified location   2. Moderate persistent asthma, uncomplicated   3. Seasonal and perennial allergic rhinitis     Meds ordered this encounter  Medications   Spacer/Aero-Holding Chambers DEVI    Sig: 1 each by Does not apply route as needed.    Dispense:  1 each    Refill:  0   amoxicillin-clavulanate (AUGMENTIN) 875-125 MG tablet    Sig: Take one tablet twice a day for 7 days    Dispense:  14 tablet    Refill:  0    Patient Instructions  1. Moderate persistent asthma, uncomplicated Try using your albuterol for shortness of breath. Let us know if your shortness of breath does not get better - Daily controller medication(s): Continue Symbicort 160/4.5 mcg 2 puffs twice a day with spacer. Then when out switch to Advair HFA 115/21 mcg two puffs twice daily with spacer - Prior to physical activity: albuterol 2 puffs 10-15 minutes before physical activity. - Rescue medications: albuterol 4 puffs every 4-6 hours as needed - Asthma control goals:  * Full participation in all desired activities (may need albuterol before activity) * Albuterol use two time or less a week on average (not counting use with activity) * Cough interfering with sleep two time or less a month * Oral steroids no more than once a year * No hospitalizations  2. Perennial allergic rhinitis (grasses, ragweed, outdoor  molds, dust mites, cockroach) Continue allergy injections per protocol and have access to your epinephrine auto injector device  - Continue with cetirizine 10 mg daily. - Continue with Flonase 1 to 2 sprays per nostril ONCE DAILY.  - Continue with Astelin 1 to 2 sprays per nostril ONCE DAILY.   3. Acute sinus infection Start Augmentin 875 mg taking 1 tablet twice a day for 7 days If you continue to have sore throat recommend going to urgent care or primary care physician to get checked for strep throat Verbally discussed not getting allergy injections while taking Augmentin Let us know if you develop  a fever  Your blood pressure was elevated today while in the office today. Please schedule an appointment with her primary care physician 3. Keep your already scheduled follow up appointment on 08/26/22 @ 3 PM with Dr. Ernst Bowler   Return in about 3 weeks (around 08/26/2022), or if symptoms worsen or fail to improve.    Thank you for the opportunity to care for this patient.  Please do not hesitate to contact me with questions.  Althea Charon, FNP Allergy and [facility]

## 2022-08-12 DIAGNOSIS — Z23 Encounter for immunization: Secondary | ICD-10-CM | POA: Diagnosis not present

## 2022-08-19 ENCOUNTER — Ambulatory Visit: Payer: Medicare Other | Admitting: Allergy & Immunology

## 2022-08-21 ENCOUNTER — Ambulatory Visit (INDEPENDENT_AMBULATORY_CARE_PROVIDER_SITE_OTHER): Payer: Medicare Other | Admitting: *Deleted

## 2022-08-21 DIAGNOSIS — J309 Allergic rhinitis, unspecified: Secondary | ICD-10-CM | POA: Diagnosis not present

## 2022-08-26 ENCOUNTER — Encounter: Payer: Self-pay | Admitting: Allergy & Immunology

## 2022-08-26 ENCOUNTER — Other Ambulatory Visit: Payer: Self-pay

## 2022-08-26 ENCOUNTER — Ambulatory Visit (INDEPENDENT_AMBULATORY_CARE_PROVIDER_SITE_OTHER): Payer: Medicare Other | Admitting: Allergy & Immunology

## 2022-08-26 ENCOUNTER — Ambulatory Visit: Payer: Self-pay

## 2022-08-26 VITALS — BP 122/70 | HR 63 | Temp 97.7°F | Resp 12 | Wt 295.4 lb

## 2022-08-26 DIAGNOSIS — J309 Allergic rhinitis, unspecified: Secondary | ICD-10-CM

## 2022-08-26 DIAGNOSIS — J4540 Moderate persistent asthma, uncomplicated: Secondary | ICD-10-CM

## 2022-08-26 DIAGNOSIS — J302 Other seasonal allergic rhinitis: Secondary | ICD-10-CM

## 2022-08-26 NOTE — Progress Notes (Unsigned)
FOLLOW UP  Date of Service/Encounter:  08/26/22   Assessment:   Moderate persistent asthma - did not do spirometry today per patient request   Perennial and seasonal allergic rhinitis (grasses,  ragweed, outdoor molds, dust mites, cockroach)   Currently on allergen immunotherapy for dust mites, cockroach, ragweed - on allergen immunotherapy with maintenance reached August 2020   Complex medical history   Fully vaccinated for [facility], including the latest bivalent booster   Recently experienced fainting episode - unclear etiology    Plan/Recommendations:   1. Moderate persistent asthma, uncomplicated - Lung testing looked great today. - We are not going to make any changes today. - Let us know about the cost of Symbicort with your new insurance plan.  - Daily controller medication(s): Symbicort 160/4.68mcg two puffs twice daily with spacer - Prior to physical activity: albuterol 2 puffs 10-15 minutes before physical activity. - Rescue medications: albuterol 4 puffs every 4-6 hours as needed - Asthma control goals:  * Full participation in all desired activities (may need albuterol before activity) * Albuterol use two time or less a week on average (not counting use with activity) * Cough interfering with sleep two time or less a month * Oral steroids no more than once a year * No hospitalizations  2. Perennial allergic rhinitis (grasses, ragweed, outdoor molds, dust mites, cockroach) - Continue with allergy shots at the same schedule.  - Continue with cetirizine 10 mg daily. - Continue with Flonase 1 to 2 sprays per nostril ONCE DAILY.  - Continue with Astelin 1 to 2 sprays per nostril ONCE DAILY.  - Use the nasal saline rinses religiously for the next month or so to stay ahead of these allergy symptoms.   3. Follow up in 2 months or earlier if needed.   Subjective:   Debralee HAVILAH KNIPPEL is a 67 y.o. female presenting today for follow up of  Chief Complaint  Patient  presents with   Follow-up    Medication clarification on inhalers    Alis C Triana has a history of the following: Patient Active Problem List   Diagnosis Date Noted   Hyperlipidemia associated with type 2 diabetes mellitus 01/25/2021   Type 2 diabetes mellitus with morbid obesity 01/25/2021   Moderate persistent asthma, uncomplicated [id number]   OSA (obstructive sleep apnea) 12/02/2016   Hyperlipidemia LDL goal <100 10/21/2014   Essential hypertension, benign 123456   DM w/o complication type II 123456   Routine general medical examination at a health care facility 10/12/2013   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 05/03/2013   Need for prophylactic vaccination and inoculation against influenza 02/08/2013   GERD (gastroesophageal reflux disease) 02/08/2013   Other and unspecified hyperlipidemia 11/10/2012   Type II or unspecified type diabetes mellitus without mention of complication, not stated as uncontrolled 10/25/2012   Impaired fasting glucose 09/07/2012   Morbid obesity 09/07/2012   Generalized osteoarthrosis, involving multiple sites 09/07/2012   Carpal tunnel syndrome 09/07/2012   Other, multiple, and unspecified sites, insect bite, nonvenomous, without mention of infection(919.4) 09/07/2012   Special screening for malignant neoplasms, colon 09/07/2012   Other specified cardiac dysrhythmias(427.89) 09/07/2012   Candidiasis of vulva and vagina 09/07/2012   Abdominal pain, generalized 09/07/2012   Symptomatic menopausal or female climacteric states 09/07/2012   Hypertension associated with diabetes ([place]) 09/07/2012   Perennial allergic rhinitis 09/07/2012   Pain in joint, site unspecified 09/07/2012   Other malaise and fatigue 09/07/2012    History obtained from: chart  review and {Persons; PED relatives w/patient:19415::"patient"}.  Derry is a 67 y.o. female presenting for {Blank single:19197::"a food challenge","a drug challenge","skin testing","a sick  visit","an evaluation of ***","a follow up visit"}. She was last seen March 2024. At that time, we continued with Symbicort 160mg  two puffs BID transitioning to Advair HFA due to cost. For her rhinitis, we continue with allergen immunotherapy as well as cetirizine, Flonase, and Astelin. She was started on Augmentin twice daily for 7 days. She had a mildly elevated BP at that visit.   Since the last visit, she has done well. She did complete her antibiotic. She went to [place] and reports that she ha da good time with her family. They spent most of their time in the [place].   Asthma/Respiratory Symptom History: She thinks that she was changed to Advair due to her insurance. She is not sure what is going on with her insurance since she is changing tomorrow.   {Blank single:19197::"Allergic Rhinitis Symptom History: ***"," "}  {Blank single:19197::"Food Allergy Symptom History: ***"," "}  {Blank single:19197::"Skin Symptom History: ***"," "}  {Blank single:19197::"GERD Symptom History: ***"," "}  Otherwise, there have been no changes to her past medical history, surgical history, family history, or social history.    ROS     Objective:   Blood pressure 122/70, pulse 63, temperature 97.7 F (36.5 C), temperature source Temporal, resp. rate 12, weight 295 lb 6.4 oz (134 kg), SpO2 98 %. Body mass index is 44.92 kg/m.    Physical Exam   Diagnostic studies:    Spirometry: results normal (FEV1: 1.69/77%, FVC: 2.14/76%, FEV1/FVC: 79%).    Spirometry consistent with normal pattern.   Allergy Studies: {Blank single:19197::"none","labs sent instead"," "}    {Blank single:19197::"Allergy testing results were read and interpreted by myself, documented by clinical staff."," "}      Salvatore Marvel, MD  Allergy and [facility]

## 2022-08-26 NOTE — Patient Instructions (Addendum)
H1. Moderate persistent asthma, uncomplicated - Lung testing looked great today. - We are not going to make any changes today. - Let us know about the cost of Symbicort with your new insurance plan.  - Daily controller medication(s): Symbicort 160/4.73mcg two puffs twice daily with spacer - Prior to physical activity: albuterol 2 puffs 10-15 minutes before physical activity. - Rescue medications: albuterol 4 puffs every 4-6 hours as needed - Asthma control goals:  * Full participation in all desired activities (may need albuterol before activity) * Albuterol use two time or less a week on average (not counting use with activity) * Cough interfering with sleep two time or less a month * Oral steroids no more than once a year * No hospitalizations  2. Perennial allergic rhinitis (grasses, ragweed, outdoor molds, dust mites, cockroach) - Continue with allergy shots at the same schedule.  - Continue with cetirizine 10 mg daily. - Continue with Flonase 1 to 2 sprays per nostril ONCE DAILY.  - Continue with Astelin 1 to 2 sprays per nostril ONCE DAILY.  - Use the nasal saline rinses religiously for the next month or so to stay ahead of these allergy symptoms.   3. Follow up in 2 months or earlier if needed.    Please inform us of any Emergency Department visits, hospitalizations, or changes in symptoms. Call us before going to the ED for breathing or allergy symptoms since we might be able to fit you in for a sick visit. Feel free to contact us anytime with any questions, problems, or concerns.  It was a pleasure to see you again today!  Websites that have reliable patient information: 1. American Academy of Asthma, Allergy, and Immunology: www.aaaai.org 2. Food Allergy Research and Education (FARE): foodallergy.org 3. Mothers of Asthmatics: http://www.asthmacommunitynetwork.org 4. American College of Allergy, Asthma, and Immunology: www.acaai.org   COVID-19 Vaccine Information can be  found at: ShippingScam.co.uk For questions related to vaccine distribution or appointments, please email [email or web address] or call [phone].   We realize that you might be concerned about having an allergic reaction to the COVID19 vaccines. To help with that concern, WE ARE OFFERING THE COVID19 VACCINES IN OUR OFFICE! Ask the front desk for dates!     "Like" [place] on Facebook and Instagram for our latest updates!      A healthy democracy works best when [employer] participate! Make sure you are registered to vote! If you have moved or changed any of your contact information, you will need to get this updated before voting!  In some cases, you MAY be able to register to vote online: [email or web address]

## 2022-08-27 ENCOUNTER — Other Ambulatory Visit: Payer: Self-pay | Admitting: Internal Medicine

## 2022-08-27 DIAGNOSIS — E119 Type 2 diabetes mellitus without complications: Secondary | ICD-10-CM

## 2022-08-27 DIAGNOSIS — I10 Essential (primary) hypertension: Secondary | ICD-10-CM

## 2022-08-28 ENCOUNTER — Ambulatory Visit (INDEPENDENT_AMBULATORY_CARE_PROVIDER_SITE_OTHER): Payer: Medicare Other

## 2022-08-28 ENCOUNTER — Encounter: Payer: Self-pay | Admitting: Allergy & Immunology

## 2022-08-28 DIAGNOSIS — J309 Allergic rhinitis, unspecified: Secondary | ICD-10-CM

## 2022-09-02 ENCOUNTER — Telehealth: Payer: Self-pay | Admitting: *Deleted

## 2022-09-02 ENCOUNTER — Ambulatory Visit (INDEPENDENT_AMBULATORY_CARE_PROVIDER_SITE_OTHER): Payer: Medicare Other | Admitting: *Deleted

## 2022-09-02 DIAGNOSIS — J309 Allergic rhinitis, unspecified: Secondary | ICD-10-CM | POA: Diagnosis not present

## 2022-09-02 MED ORDER — LEVOCETIRIZINE DIHYDROCHLORIDE 5 MG PO TABS: 5.0000 mg | ORAL_TABLET | Freq: Every evening | ORAL | 1 refills | Status: DC

## 2022-09-02 NOTE — Telephone Encounter (Signed)
Received a refill request for Levocetirizine 5mg  tablet for 90 day supply to be refilled. In your last office note you mentioned Cetirizine. Is it ok to refill Levocetirizine?

## 2022-09-02 NOTE — Addendum Note (Signed)
Addended by: Alfonse Spruce on: 09/02/2022 06:53 PM   Modules accepted: Orders

## 2022-09-02 NOTE — Telephone Encounter (Signed)
Sure levocetirizine is fine with me. I sent in the script.

## 2022-09-04 ENCOUNTER — Ambulatory Visit (INDEPENDENT_AMBULATORY_CARE_PROVIDER_SITE_OTHER): Payer: Medicare Other

## 2022-09-04 DIAGNOSIS — J309 Allergic rhinitis, unspecified: Secondary | ICD-10-CM

## 2022-09-08 ENCOUNTER — Other Ambulatory Visit: Payer: Self-pay | Admitting: Internal Medicine

## 2022-09-09 ENCOUNTER — Ambulatory Visit (INDEPENDENT_AMBULATORY_CARE_PROVIDER_SITE_OTHER): Payer: Medicare Other

## 2022-09-09 DIAGNOSIS — J309 Allergic rhinitis, unspecified: Secondary | ICD-10-CM | POA: Diagnosis not present

## 2022-09-09 NOTE — Telephone Encounter (Signed)
Requested Prescriptions  Pending Prescriptions Disp Refills   triamterene-hydrochlorothiazide (MAXZIDE) 75-50 MG tablet [Pharmacy Med Name: TRIAMTERENE-HCTZ 75-50 MG TAB] 90 tablet 0    Sig: TAKE 1 TABLET BY MOUTH EVERY DAY     Cardiovascular: Diuretic Combos Failed - 09/08/2022  2:35 AM      Failed - K in normal range and within 180 days    Potassium  Date Value Ref Range Status  12/12/2021 4.4 3.5 - 5.2 mmol/L Final         Failed - Na in normal range and within 180 days    Sodium  Date Value Ref Range Status  12/12/2021 143 134 - 144 mmol/L Final         Failed - Cr in normal range and within 180 days    Creat  Date Value Ref Range Status  08/17/2018 0.83 0.50 - 0.99 mg/dL Final    Comment:    For patients >30 years of age, the reference limit for Creatinine is approximately 13% higher for people identified as African-American. .    Creatinine, Ser  Date Value Ref Range Status  12/12/2021 0.97 0.57 - 1.00 mg/dL Final   Creatinine, Urine  Date Value Ref Range Status  07/27/2018 54 20 - 275 mg/dL Final         Passed - Last BP in normal range    BP Readings from Last 1 Encounters:  08/26/22 122/70         Passed - Valid encounter within last 6 months    Recent Outpatient Visits           3 months ago Type 2 diabetes mellitus with morbid obesity [facility])   [place] [facility] Jonah Blue B, MD   7 months ago Type 2 diabetes mellitus with morbid obesity [facility])   [place] [facility] Marcine Matar, MD   9 months ago Syncope and collapse   [place] [facility] Jonah Blue B, MD   11 months ago Type 2 diabetes mellitus with morbid obesity [facility])   [facility] Marcine Matar, MD   1 year ago Dermatitis   [place] [facility], Kasandra Knudsen, [place]       Future Appointments             In 3 weeks Laural Benes, Binnie Rail, MD [facility]   In 1 month Young, Rennis Chris, MD [facility] [place]   In 1 month Dellis Anes, Hetty Ely, MD [facility] [place]   In 5 months Young, Rennis Chris, MD [facility]

## 2022-09-11 ENCOUNTER — Ambulatory Visit (INDEPENDENT_AMBULATORY_CARE_PROVIDER_SITE_OTHER): Payer: Medicare Other

## 2022-09-11 DIAGNOSIS — J309 Allergic rhinitis, unspecified: Secondary | ICD-10-CM

## 2022-09-16 ENCOUNTER — Ambulatory Visit (INDEPENDENT_AMBULATORY_CARE_PROVIDER_SITE_OTHER): Payer: Medicare Other

## 2022-09-16 DIAGNOSIS — J309 Allergic rhinitis, unspecified: Secondary | ICD-10-CM | POA: Diagnosis not present

## 2022-09-17 ENCOUNTER — Other Ambulatory Visit: Payer: Self-pay | Admitting: Allergy & Immunology

## 2022-09-18 ENCOUNTER — Ambulatory Visit (INDEPENDENT_AMBULATORY_CARE_PROVIDER_SITE_OTHER): Payer: Medicare Other

## 2022-09-18 DIAGNOSIS — J309 Allergic rhinitis, unspecified: Secondary | ICD-10-CM | POA: Diagnosis not present

## 2022-09-22 ENCOUNTER — Ambulatory Visit (INDEPENDENT_AMBULATORY_CARE_PROVIDER_SITE_OTHER): Payer: Medicare Other

## 2022-09-22 DIAGNOSIS — J309 Allergic rhinitis, unspecified: Secondary | ICD-10-CM

## 2022-09-29 ENCOUNTER — Ambulatory Visit (INDEPENDENT_AMBULATORY_CARE_PROVIDER_SITE_OTHER): Payer: Medicare Other | Admitting: *Deleted

## 2022-09-29 DIAGNOSIS — J309 Allergic rhinitis, unspecified: Secondary | ICD-10-CM

## 2022-10-03 ENCOUNTER — Ambulatory Visit: Payer: Medicare Other | Attending: Internal Medicine | Admitting: Internal Medicine

## 2022-10-03 ENCOUNTER — Encounter: Payer: Self-pay | Admitting: Internal Medicine

## 2022-10-03 DIAGNOSIS — K219 Gastro-esophageal reflux disease without esophagitis: Secondary | ICD-10-CM | POA: Insufficient documentation

## 2022-10-03 DIAGNOSIS — R102 Pelvic and perineal pain: Secondary | ICD-10-CM | POA: Insufficient documentation

## 2022-10-03 DIAGNOSIS — G894 Chronic pain syndrome: Secondary | ICD-10-CM | POA: Insufficient documentation

## 2022-10-03 DIAGNOSIS — I10 Essential (primary) hypertension: Secondary | ICD-10-CM | POA: Insufficient documentation

## 2022-10-03 DIAGNOSIS — E1159 Type 2 diabetes mellitus with other circulatory complications: Secondary | ICD-10-CM | POA: Diagnosis not present

## 2022-10-03 DIAGNOSIS — E785 Hyperlipidemia, unspecified: Secondary | ICD-10-CM | POA: Diagnosis not present

## 2022-10-03 DIAGNOSIS — E119 Type 2 diabetes mellitus without complications: Secondary | ICD-10-CM | POA: Diagnosis present

## 2022-10-03 DIAGNOSIS — Z6841 Body Mass Index (BMI) 40.0 and over, adult: Secondary | ICD-10-CM | POA: Insufficient documentation

## 2022-10-03 DIAGNOSIS — J4540 Moderate persistent asthma, uncomplicated: Secondary | ICD-10-CM | POA: Insufficient documentation

## 2022-10-03 DIAGNOSIS — Z833 Family history of diabetes mellitus: Secondary | ICD-10-CM | POA: Diagnosis not present

## 2022-10-03 DIAGNOSIS — I152 Hypertension secondary to endocrine disorders: Secondary | ICD-10-CM

## 2022-10-03 DIAGNOSIS — Z7951 Long term (current) use of inhaled steroids: Secondary | ICD-10-CM | POA: Diagnosis not present

## 2022-10-03 DIAGNOSIS — Z79899 Other long term (current) drug therapy: Secondary | ICD-10-CM | POA: Insufficient documentation

## 2022-10-03 DIAGNOSIS — E1169 Type 2 diabetes mellitus with other specified complication: Secondary | ICD-10-CM

## 2022-10-03 DIAGNOSIS — G4733 Obstructive sleep apnea (adult) (pediatric): Secondary | ICD-10-CM | POA: Diagnosis not present

## 2022-10-03 LAB — POCT GLYCOSYLATED HEMOGLOBIN (HGB A1C): HbA1c, POC (controlled diabetic range): 6 % (ref 0.0–7.0)

## 2022-10-03 LAB — GLUCOSE, POCT (MANUAL RESULT ENTRY): POC Glucose: 149 mg/dl — AB (ref 70–99)

## 2022-10-03 MED ORDER — LISINOPRIL 10 MG PO TABS: 10.0000 mg | ORAL_TABLET | Freq: Every day | ORAL | 1 refills | Status: DC

## 2022-10-03 MED ORDER — OMEPRAZOLE 40 MG PO CPDR: 40.0000 mg | DELAYED_RELEASE_CAPSULE | Freq: Every day | ORAL | 1 refills | Status: DC

## 2022-10-03 NOTE — Patient Instructions (Signed)
I encourage you to start walking or using your exercise bike at least 3 days a week. I have enclosed some information below about healthy eating habits.  Healthy Eating, Adult Healthy eating may help you get and keep a healthy body weight, reduce the risk of chronic disease, and live a long and productive life. It is important to follow a healthy eating pattern. Your nutritional and calorie needs should be met mainly by different nutrient-rich foods. What are tips for following this plan? Reading food labels Read labels and choose the following: Reduced or low sodium products. Juices with 100% fruit juice. Foods with low saturated fats (<3 g per serving) and high polyunsaturated and monounsaturated fats. Foods with whole grains, such as whole wheat, cracked wheat, brown rice, and wild rice. Whole grains that are fortified with folic acid. This is recommended for females who are pregnant or who want to become pregnant. Read labels and do not eat or drink the following: Foods or drinks with added sugars. These include foods that contain brown sugar, corn sweetener, corn syrup, dextrose, fructose, glucose, high-fructose corn syrup, honey, invert sugar, lactose, malt syrup, maltose, molasses, raw sugar, sucrose, trehalose, or turbinado sugar. Limit your intake of added sugars to less than 10% of your total daily calories. Do not eat more than the following amounts of added sugar per day: 6 teaspoons (25 g) for females. 9 teaspoons (38 g) for males. Foods that contain processed or refined starches and grains. Refined grain products, such as white flour, degermed cornmeal, white bread, and white rice. Shopping Choose nutrient-rich snacks, such as vegetables, whole fruits, and nuts. Avoid high-calorie and high-sugar snacks, such as potato chips, fruit snacks, and candy. Use oil-based dressings and spreads on foods instead of solid fats such as butter, margarine, sour cream, or cream cheese. Limit  pre-made sauces, mixes, and "instant" products such as flavored rice, instant noodles, and ready-made pasta. Try more plant-protein sources, such as tofu, tempeh, black beans, edamame, lentils, nuts, and seeds. Explore eating plans such as the Mediterranean diet or vegetarian diet. Try heart-healthy dips made with beans and healthy fats like hummus and guacamole. Vegetables go great with these. Cooking Use oil to saut or stir-fry foods instead of solid fats such as butter, margarine, or lard. Try baking, boiling, grilling, or broiling instead of frying. Remove the fatty part of meats before cooking. Steam vegetables in water or broth. Meal planning  At meals, imagine dividing your plate into fourths: One-half of your plate is fruits and vegetables. One-fourth of your plate is whole grains. One-fourth of your plate is protein, especially lean meats, poultry, eggs, tofu, beans, or nuts. Include low-fat dairy as part of your daily diet. Lifestyle Choose healthy options in all settings, including home, work, school, restaurants, or stores. Prepare your food safely: Wash your hands after handling raw meats. Where you prepare food, keep surfaces clean by regularly washing with hot, soapy water. Keep raw meats separate from ready-to-eat foods, such as fruits and vegetables. Cook seafood, meat, poultry, and eggs to the recommended temperature. Get a food thermometer. Store foods at safe temperatures. In general: Keep cold foods at 52F (4.4C) or below. Keep hot foods at 152F (60C) or above. Keep your freezer at [facility] (-17.8C) or below. Foods are not safe to eat if they have been between the temperatures of 40-152F (4.4-60C) for more than 2 hours. What foods should I eat? Fruits Aim to eat 1-2 cups of fresh, canned (in natural juice), or frozen fruits  each day. One cup of fruit equals 1 small apple, 1 large banana, 8 large strawberries, 1 cup (237 g) canned fruit,  cup (82 g) dried  fruit, or 1 cup (240 mL) 100% juice. Vegetables Aim to eat 2-4 cups of fresh and frozen vegetables each day, including different varieties and colors. One cup of vegetables equals 1 cup (91 g) broccoli or cauliflower florets, 2 medium carrots, 2 cups (150 g) raw, leafy greens, 1 large tomato, 1 large bell pepper, 1 large sweet potato, or 1 medium white potato. Grains Aim to eat 5-10 ounce-equivalents of whole grains each day. Examples of 1 ounce-equivalent of grains include 1 slice of bread, 1 cup (40 g) ready-to-eat cereal, 3 cups (24 g) popcorn, or  cup (93 g) cooked rice. Meats and other proteins Try to eat 5-7 ounce-equivalents of protein each day. Examples of 1 ounce-equivalent of protein include 1 egg,  oz nuts (12 almonds, 24 pistachios, or 7 walnut halves), 1/4 cup (90 g) cooked beans, 6 tablespoons (90 g) hummus or 1 tablespoon (16 g) peanut butter. A cut of meat or fish that is the size of a deck of cards is about 3-4 ounce-equivalents (85 g). Of the protein you eat each week, try to have at least 8 sounce (227 g) of seafood. This is about 2 servings per week. This includes salmon, trout, herring, sardines, and anchovies. Dairy Aim to eat 3 cup-equivalents of fat-free or low-fat dairy each day. Examples of 1 cup-equivalent of dairy include 1 cup (240 mL) milk, 8 ounces (250 g) yogurt, 1 ounces (44 g) natural cheese, or 1 cup (240 mL) fortified soy milk. Fats and oils Aim for about 5 teaspoons (21 g) of fats and oils per day. Choose monounsaturated fats, such as canola and olive oils, mayonnaise made with olive oil or avocado oil, avocados, peanut butter, and most nuts, or polyunsaturated fats, such as sunflower, corn, and soybean oils, walnuts, pine nuts, sesame seeds, sunflower seeds, and flaxseed. Beverages Aim for 6 eight-ounce glasses of water per day. Limit coffee to 3-5 eight-ounce cups per day. Limit caffeinated beverages that have added calories, such as soda and energy drinks. If  you drink alcohol: Limit how much you have to: 0-1 drink a day if you are female. 0-2 drinks a day if you are female. Know how much alcohol is in your drink. In the [place] one drink is one 12 oz bottle of beer (355 mL), one 5 oz glass of wine (148 mL), or one 1 oz glass of hard liquor (44 mL). Seasoning and other foods Try not to add too much salt to your food. Try using herbs and spices instead of salt. Try not to add sugar to food. This information is based on [place] nutrition guidelines. To learn more, visit DisposableNylon.be. Exact amounts may vary. You may need different amounts. This information is not intended to replace advice given to you by your health care provider. Make sure you discuss any questions you have with your health care provider. Document Revised: 02/10/2022 Document Reviewed: 02/10/2022 Elsevier Patient Education  2023 [employer].

## 2022-10-03 NOTE — Progress Notes (Signed)
Patient ID: MONESHIA ANTELL, female    DOB: August 13, 1955  MRN: [id number]  CC: Diabetes (DM f/u./No questions / concerns)   Subjective: Porschea Arciaga is a 67 y.o. female who presents for chronic ds management Her concerns today include:  Pt with hx of DM type 2, HTN, HL, moderate persistent asthma, OSA on CPAP, morbid obesity, arthritis/LBP, chronic pelvic pain syndrome/pelvic floor dysfunction followed by urology Dr. Robby Sermon   She has been seeing Dr. Dellis Anes for her allergies Getting 2 allergy shots once a wk.  On Xyzal, Flonase nasal and Astelin Advair and Symbicort on list but pt states she is just on the Symbicort and doing well in regards to her asthma  DM/obesity: Results for orders placed or performed in visit on 10/03/22  POCT glucose (manual entry)  Result Value Ref Range   POC Glucose 149 (A) 70 - 99 mg/dl  POCT glycosylated hemoglobin (Hb A1C)  Result Value Ref Range   Hemoglobin A1C     HbA1c POC (<> result, manual entry)     HbA1c, POC (prediabetic range)     HbA1c, POC (controlled diabetic range) 6.0 0.0 - 7.0 %  Has a stationary bike but not using it and not walking.  Plans to start walking.  Saw other and given some exercises for her back. Patient is diet controlled. Eats heaviest meal for lunch and light for BF and dinner  HYPERTENSION Currently taking: see medication list.  Patient on Maxide 75/50 mg and lisinopril 10 mg daily. Med Adherence: [x]  Yes    []  No Medication side effects: []  Yes    [x]  No Adherence with salt restriction: [x]  Yes -trying   []  No Home Monitoring?: []  Yes    []  No Monitoring Frequency:  Home BP results range:  SOB? []  Yes    [x]  No Chest Pain?: []  Yes    [x]  No Leg swelling?: [x]  Yes a little at times   []  No Headaches?: []  Yes    [x]  No Dizziness? []  Yes    [x]  No Comments:   HL: Taking and tolerating Zocor 20 mg daily.  Last LDL was 84 in September of last year.  Needs RF on Omeprazole.  Doing well on med.  Stomach gets upset  when she eats certain foods.  States she is away from greasy foods. Patient Active Problem List   Diagnosis Date Noted   Hyperlipidemia associated with type 2 diabetes mellitus (HCC) 01/25/2021   Type 2 diabetes mellitus with morbid obesity (HCC) 01/25/2021   Moderate persistent asthma, uncomplicated 11/04/2018   OSA (obstructive sleep apnea) 12/02/2016   Hyperlipidemia LDL goal <100 10/21/2014   Essential hypertension, benign 10/21/2014   DM w/o complication type II (HCC) 09/13/2014   Routine general medical examination at a health care facility 10/12/2013   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 05/03/2013   Need for prophylactic vaccination and inoculation against influenza 02/08/2013   GERD (gastroesophageal reflux disease) 02/08/2013   Other and unspecified hyperlipidemia 11/10/2012   Type II or unspecified type diabetes mellitus without mention of complication, not stated as uncontrolled 10/25/2012   Impaired fasting glucose 09/07/2012   Morbid obesity (HCC) 09/07/2012   Generalized osteoarthrosis, involving multiple sites 09/07/2012   Carpal tunnel syndrome 09/07/2012   Other, multiple, and unspecified sites, insect bite, nonvenomous, without mention of infection(919.4) 09/07/2012   Special screening for malignant neoplasms, colon 09/07/2012   Other specified cardiac dysrhythmias(427.89) 09/07/2012   Candidiasis of vulva and vagina 09/07/2012  Abdominal pain, generalized 09/07/2012   Symptomatic menopausal or female climacteric states 09/07/2012   Hypertension associated with diabetes (HCC) 09/07/2012   Perennial allergic rhinitis 09/07/2012   Pain in joint, site unspecified 09/07/2012   Other malaise and fatigue 09/07/2012     Current Outpatient Medications on File Prior to Visit  Medication Sig Dispense Refill   acetaminophen (TYLENOL) 500 MG tablet Take 500 mg by mouth daily.     albuterol (VENTOLIN HFA) 108 (90 Base) MCG/ACT inhaler INHALE 2 PUFFS INTO THE LUNGS  UP TO EVERY 6HRS AS NEEDED FOR WHEEZING/SHORTNESS OF BREATH 18 each 1   AMBULATORY NON FORMULARY MEDICATION Medication Name: Allergy Injection- every 4 weeks     amoxicillin-clavulanate (AUGMENTIN) 875-125 MG tablet Take one tablet twice a day for 7 days 14 tablet 0   aspirin 81 MG tablet Take 81 mg by mouth daily.     azelastine (ASTELIN) 0.1 % nasal spray Place 2 sprays into both nostrils 2 (two) times daily. 30 mL 5   B-D ULTRA-FINE 33 LANCETS MISC Use to test blood sugar once daily. Dx: E11.9 100 each 6   budesonide-formoterol (SYMBICORT) 160-4.5 MCG/ACT inhaler TAKE 2 PUFFS BY MOUTH TWICE A DAY 30.6 each 5   Calcium Carbonate-Vitamin D 600-400 MG-UNIT chew tablet Chew 1 tablet by mouth daily.      EPINEPHrine 0.3 mg/0.3 mL IJ SOAJ injection Inject 0.3 mg into the muscle as needed for anaphylaxis. 2 each 1   fluticasone (FLONASE) 50 MCG/ACT nasal spray Place 1-2 sprays into both nostrils daily. 48 mL 2   levocetirizine (XYZAL) 5 MG tablet Take 1 tablet (5 mg total) by mouth every evening. 90 tablet 1   Multiple Vitamin (MULTIVITAMIN ADULT PO) 1 tablet     nabumetone (RELAFEN) 500 MG tablet Take 500 mg by mouth daily. PRN     Respiratory Therapy Supplies (CARETOUCH 2 CPAP HOSE HANGER) MISC      simvastatin (ZOCOR) 20 MG tablet TAKE 1 TABLET (20 MG) BY ORAL ROUTE ONCE DAILY IN THE EVENING FOR 90 DAYS 90 tablet 3   Spacer/Aero-Holding Chambers DEVI 1 each by Does not apply route as needed. 1 each 0   triamterene-hydrochlorothiazide (MAXZIDE) 75-50 MG tablet TAKE 1 TABLET BY MOUTH EVERY DAY 90 tablet 0   vitamin C (ASCORBIC ACID) 500 MG tablet Take 500 mg by mouth every other day.     fluticasone-salmeterol (ADVAIR HFA) 115-21 MCG/ACT inhaler Inhale 2 puffs into the lungs 2 (two) times daily. (Patient not taking: Reported on 10/03/2022) 1 each 5   No current facility-administered medications on file prior to visit.    Allergies  Allergen Reactions   Misc. Sulfonamide Containing Compounds      Other reaction(s): Not available   Elemental Sulfur Diarrhea   Oxycodone     Had stomach and headache as side effect from medicine    Social History   Socioeconomic History   Marital status: Divorced    Spouse name: Not on file   Number of children: Not on file   Years of education: Not on file   Highest education level: Not on file  Occupational History   Not on file  Tobacco Use   Smoking status: Never   Smokeless tobacco: Never  Vaping Use   Vaping Use: Never used  Substance and Sexual Activity   Alcohol use: Yes    Comment: Seldom- Wine    Drug use: No   Sexual activity: Not Currently    Comment: post office worker  Other Topics Concern   Not on file  Social History Narrative   Not on file   Social Determinants of Health   Financial Resource Strain: Low Risk  (11/13/2017)   Overall Financial Resource Strain (CARDIA)    Difficulty of Paying Living Expenses: Not hard at all  Food Insecurity: No Food Insecurity (11/13/2017)   Hunger Vital Sign    Worried About Running Out of Food in the Last Year: Never true    Ran Out of Food in the Last Year: Never true  Transportation Needs: No Transportation Needs (11/13/2017)   PRAPARE - Administrator, Civil Service (Medical): No    Lack of Transportation (Non-Medical): No  Physical Activity: Inactive (11/13/2017)   Exercise Vital Sign    Days of Exercise per Week: 0 days    Minutes of Exercise per Session: 0 min  Stress: Stress Concern Present (11/13/2017)   [employer] of Occupational Health - Occupational Stress Questionnaire    Feeling of Stress : To some extent  Social Connections: Moderately Isolated (11/13/2017)   Social Connection and Isolation Panel [NHANES]    Frequency of Communication with Friends and Family: More than three times a week    Frequency of Social Gatherings with Friends and Family: Never    Attends Religious Services: Never    Database administrator or Organizations: No    Attends  Banker Meetings: Never    Marital Status: Never married  Intimate Partner Violence: Not At Risk (11/13/2017)   Humiliation, Afraid, Rape, and Kick questionnaire    Fear of Current or Ex-Partner: No    Emotionally Abused: No    Physically Abused: No    Sexually Abused: No    Family History  Problem Relation Age of Onset   Cancer Father    Diabetes Father    Allergic rhinitis Son    Diabetes Brother    Allergic rhinitis Son    Diabetes Paternal Aunt    Lung cancer Cousin    Asthma Neg Hx     Past Surgical History:  Procedure Laterality Date   ABDOMINAL HYSTERECTOMY     1994   carpal tunnel both hands     2012   CLOSED MANIPULATION SHOULDER Left 04/2014   EYE SURGERY Left 08/25/2015   EYE SURGERY Right 09/24/2015   JOINT REPLACEMENT     both knees replacement, 2010   mole removed from face     Nodule removed from back     SHOULDER SURGERY Left 11/2013   TONSILLECTOMY     as teenager    ROS: Review of Systems Negative except as stated above  PHYSICAL EXAM: BP 110/68 (BP Location: Left Arm, Patient Position: Sitting, Cuff Size: Large)   Pulse 67   Temp 98.8 F (37.1 C) (Oral)   Ht 5\' 8"  (1.727 m)   Wt 294 lb (133.4 kg)   SpO2 98%   BMI 44.70 kg/m   Wt Readings from Last 3 Encounters:  10/03/22 294 lb (133.4 kg)  08/26/22 295 lb 6.4 oz (134 kg)  07/03/22 296 lb (134.3 kg)    Physical Exam  General appearance - alert, well appearing, older African-American female and in no distress Mental status - normal mood, behavior, speech, dress, motor activity, and thought processes Chest - clear to auscultation, no wheezes, rales or rhonchi, symmetric air entry Heart - normal rate, regular rhythm, normal S1, S2, no murmurs, rubs, clicks or gallops Extremities - trace BP LE edema Diabetic  Foot Exam - Simple   Simple Foot Form Diabetic Foot exam was performed with the following findings: Yes 10/03/2022  2:50 PM  Visual Inspection See comments:  Yes Sensation Testing Intact to touch and monofilament testing bilaterally: Yes Pulse Check Posterior Tibialis and Dorsalis pulse intact bilaterally: Yes Comments Flat footed.         Latest Ref Rng & Units 01/28/2022    4:29 PM 12/12/2021   10:41 AM 01/25/2021    2:38 PM  CMP  Glucose 70 - 99 mg/dL  409  92   BUN 8 - 27 mg/dL  14  12   Creatinine 8.11 - 1.00 mg/dL  9.14  7.82   Sodium 956 - 144 mmol/L  143  141   Potassium 3.5 - 5.2 mmol/L  4.4  4.4   Chloride 96 - 106 mmol/L  101  100   CO2 20 - 29 mmol/L  24  24   Calcium 8.7 - 10.3 mg/dL  21.3  08.6   Total Protein 6.0 - 8.5 g/dL 7.5   7.5   Total Bilirubin 0.0 - 1.2 mg/dL <5.7   0.3   Alkaline Phos 44 - 121 IU/L 69   68   AST 0 - 40 IU/L 19   24   ALT 0 - 32 IU/L 14   14    Lipid Panel     Component Value Date/Time   CHOL 167 01/28/2022 1629   TRIG 175 (H) 01/28/2022 1629   HDL 53 01/28/2022 1629   CHOLHDL 3.2 01/28/2022 1629   CHOLHDL 3.0 07/23/2018 1045   VLDL 20 11/17/2016 1007   LDLCALC 84 01/28/2022 1629   LDLCALC 86 07/23/2018 1045    CBC    Component Value Date/Time   WBC 8.6 01/28/2022 1629   WBC 9.4 11/13/2017 1025   RBC 4.94 01/28/2022 1629   RBC 4.80 11/13/2017 1025   HGB 13.7 01/28/2022 1629   HCT 41.1 01/28/2022 1629   PLT 354 01/28/2022 1629   MCV 83 01/28/2022 1629   MCH 27.7 01/28/2022 1629   MCH 26.9 (L) 11/13/2017 1025   MCHC 33.3 01/28/2022 1629   MCHC 33.3 11/13/2017 1025   RDW 14.6 01/28/2022 1629   LYMPHSABS 2.3 07/29/2018 1443   MONOABS 576 11/17/2016 1007   EOSABS 0.2 07/29/2018 1443   BASOSABS 0.0 07/29/2018 1443    ASSESSMENT AND PLAN: 1. Type 2 diabetes mellitus with morbid obesity (HCC) At goal and diet control. Encouraged her to move more.  She promises to start walking or using her stationary bike several days a week now that her back is better. - POCT glucose (manual entry) - POCT glycosylated hemoglobin (Hb A1C)  2. Hypertension associated with diabetes (HCC) At  goal.  Continue lisinopril 10 mg daily and Maxide 75/50 mg daily - lisinopril (ZESTRIL) 10 MG tablet; Take 1 tablet (10 mg total) by mouth daily.  Dispense: 90 tablet; Refill: 1  3. Hyperlipidemia associated with type 2 diabetes mellitus (HCC) Continue simvastatin 20 mg daily.  4. Gastroesophageal reflux disease - omeprazole (PRILOSEC) 40 MG capsule; Take 1 capsule (40 mg total) by mouth daily.  Dispense: 90 capsule; Refill: 1     Patient was given the opportunity to ask questions.  Patient verbalized understanding of the plan and was able to repeat key elements of the plan.   This documentation was completed using Paediatric nurse.  Any transcriptional errors are unintentional.  Orders Placed This Encounter  Procedures   POCT  glucose (manual entry)   POCT glycosylated hemoglobin (Hb A1C)     Requested Prescriptions   Signed Prescriptions Disp Refills   lisinopril (ZESTRIL) 10 MG tablet 90 tablet 1    Sig: Take 1 tablet (10 mg total) by mouth daily.   omeprazole (PRILOSEC) 40 MG capsule 90 capsule 1    Sig: Take 1 capsule (40 mg total) by mouth daily.    Return in about 4 months (around 02/03/2023).  Jonah Blue, MD, FACP

## 2022-10-06 ENCOUNTER — Ambulatory Visit (INDEPENDENT_AMBULATORY_CARE_PROVIDER_SITE_OTHER): Payer: Medicare Other | Admitting: *Deleted

## 2022-10-06 DIAGNOSIS — J309 Allergic rhinitis, unspecified: Secondary | ICD-10-CM

## 2022-10-13 ENCOUNTER — Ambulatory Visit (INDEPENDENT_AMBULATORY_CARE_PROVIDER_SITE_OTHER): Payer: Medicare Other | Admitting: *Deleted

## 2022-10-13 DIAGNOSIS — J309 Allergic rhinitis, unspecified: Secondary | ICD-10-CM

## 2022-10-15 NOTE — Progress Notes (Signed)
Cheryl [name]    [id number]    1955-07-10  Primary Care Physician:Carter, Maxine Glenn, DO  HPI female never smoker followed for OSA, complicated by asthma( Dr Isaiah Serge), DM 2, HBP, GERD, allergic rhinitis/allergy vaccine (Dr Dickens Callas), glaucoma NPSG- 11/25/16- AHI 40.3/hour, desaturation to 88%, body weight 286 pounds  ----------------------------------------------------------------   02/20/22-67 year old female never smoker(son is Sports administrator) followed for OSA, complicated by Asthma( Dr Isaiah Serge), DM 2, HBP, GERD, Allergic Rhinitis/Allergy Vaccine   CPAP auto 5-15/ [facility]            AirSense 10 AutoSet Download-compliance 100%, AHI 5.6/ hr Body weight today-294 lbs Covid vax- 5 Phizer Flu vax- had Reviewed.  Sleeps well with CPAP.  Got replacement machine 1 month ago.  No concerns with this. Had a syncopal episode while visiting in [place] recently.  Not clear what happened and evaluation there apparently was negative.  10/16/22- 67 year old female never smoker(son is Sports administrator) followed for OSA, complicated by Asthma( Dr Isaiah Serge), DM 2, HBP, GERD, Allergic Rhinitis/Allergy Vaccine   CPAP auto 5-15/ [facility]            AirSense 10 AutoSet Download-compliance 100%, AHI 9.6/ hr Body weight today-296 lbs Download reviewed.  Went well with CPAP. Had another COVID infection for which she went to urgent care.  Spring pollen rhinitis also flaring.   ROS-see HPI   + = positive Constitutional:    weight loss, night sweats, fevers, chills, fatigue, lassitude. HEENT:    headaches, difficulty swallowing, tooth/dental problems, sore throat,       sneezing, itching, ear ache, nasal congestion, post nasal drip, snoring CV:    chest pain, orthopnea, PND, swelling in lower extremities, anasarca,                                            dizziness, palpitations Resp:   shortness of breath with exertion or at rest.                productive cough,   non-productive cough, coughing up of blood.               change in color of mucus.  wheezing.   Skin:    rash or lesions. GI:  No-   heartburn, indigestion, abdominal pain, nausea, vomiting, diarrhea,                 change in bowel habits, loss of appetite GU: dysuria, change in color of urine, no urgency or frequency.   flank pain. MS:   joint pain, stiffness, decreased range of motion, back pain. Neuro-    +syncopal episode Psych:  change in mood or affect.  depression or anxiety.   memory loss.  OBJ- Physical Exam General- Alert, Oriented, Affect-appropriate, Distress- none acute, + morbidly obese Skin- rash-none, lesions- none, excoriation- none Lymphadenopathy- none Head- atraumatic            Eyes- Gross vision intact, PERRLA, conjunctivae and secretions clear            Ears- Hearing, canals-normal            Nose- Clear, no-Septal dev, mucus, polyps, erosion, perforation             Throat- Mallampati IV , mucosa clear , drainage- none, tonsils- atrophic, + own teeth Neck- flexible , trachea midline, no stridor , thyroid  nl, carotid no bruit Chest - symmetrical excursion , unlabored           Heart/CV- RRR , +1/6S murmur , no gallop  , no rub, nl s1 s2                           - JVD- none , edema- none, stasis changes- none, varices- none           Lung- clear to P&A, wheeze- none, cough- none , dullness-none, rub- none           Chest wall-  Abd-  Br/ Gen/ Rectal- Not done, not indicated Extrem- cyanosis- none, clubbing, none, atrophy- none, strength- nl Neuro- grossly intact to observation

## 2022-10-16 ENCOUNTER — Encounter: Payer: Self-pay | Admitting: Internal Medicine

## 2022-10-16 ENCOUNTER — Ambulatory Visit (INDEPENDENT_AMBULATORY_CARE_PROVIDER_SITE_OTHER): Payer: Medicare Other | Admitting: Internal Medicine

## 2022-10-16 VITALS — BP 110/64 | HR 64 | Ht 68.0 in | Wt 296.6 lb

## 2022-10-16 DIAGNOSIS — J4540 Moderate persistent asthma, uncomplicated: Secondary | ICD-10-CM

## 2022-10-16 DIAGNOSIS — G4733 Obstructive sleep apnea (adult) (pediatric): Secondary | ICD-10-CM

## 2022-10-16 NOTE — Patient Instructions (Signed)
Order- DME [facility]- please change autopap range to 5-20, continue mask of choice, humidifier, supplies, airView/ card  Please call if we can help

## 2022-10-21 ENCOUNTER — Ambulatory Visit (INDEPENDENT_AMBULATORY_CARE_PROVIDER_SITE_OTHER): Payer: Medicare Other | Admitting: *Deleted

## 2022-10-21 DIAGNOSIS — J309 Allergic rhinitis, unspecified: Secondary | ICD-10-CM | POA: Diagnosis not present

## 2022-10-24 DIAGNOSIS — L84 Corns and callosities: Secondary | ICD-10-CM | POA: Diagnosis not present

## 2022-10-24 DIAGNOSIS — I739 Peripheral vascular disease, unspecified: Secondary | ICD-10-CM | POA: Diagnosis not present

## 2022-10-24 DIAGNOSIS — E1142 Type 2 diabetes mellitus with diabetic polyneuropathy: Secondary | ICD-10-CM | POA: Diagnosis not present

## 2022-10-24 DIAGNOSIS — E1151 Type 2 diabetes mellitus with diabetic peripheral angiopathy without gangrene: Secondary | ICD-10-CM | POA: Diagnosis not present

## 2022-10-24 DIAGNOSIS — L603 Nail dystrophy: Secondary | ICD-10-CM | POA: Diagnosis not present

## 2022-10-27 ENCOUNTER — Ambulatory Visit (INDEPENDENT_AMBULATORY_CARE_PROVIDER_SITE_OTHER): Payer: Medicare Other | Admitting: *Deleted

## 2022-10-27 DIAGNOSIS — J309 Allergic rhinitis, unspecified: Secondary | ICD-10-CM

## 2022-10-28 ENCOUNTER — Ambulatory Visit (INDEPENDENT_AMBULATORY_CARE_PROVIDER_SITE_OTHER): Payer: Medicare Other | Admitting: Allergy & Immunology

## 2022-10-28 VITALS — BP 132/72 | HR 66 | Temp 98.4°F | Resp 18 | Ht 67.25 in | Wt 291.0 lb

## 2022-10-28 DIAGNOSIS — J302 Other seasonal allergic rhinitis: Secondary | ICD-10-CM | POA: Diagnosis not present

## 2022-10-28 DIAGNOSIS — J3089 Other allergic rhinitis: Secondary | ICD-10-CM

## 2022-10-28 DIAGNOSIS — J4540 Moderate persistent asthma, uncomplicated: Secondary | ICD-10-CM | POA: Diagnosis not present

## 2022-10-28 NOTE — Progress Notes (Unsigned)
FOLLOW UP  Date of Service/Encounter:  10/29/22   Assessment:   Moderate persistent asthma - did not do spirometry today per patient request   Perennial and seasonal allergic rhinitis (grasses,  ragweed, outdoor molds, dust mites, cockroach)   Currently on allergen immunotherapy for dust mites, cockroach, ragweed - on allergen immunotherapy with maintenance reached August 2020   Complex medical history   Fully vaccinated for [facility], including the latest bivalent booster   Recently experienced fainting episode - unclear etiology    Plan/Recommendations:   1. Moderate persistent asthma, uncomplicated - Lung testing looked great today, despite the sounds that the machine made! :-)  - Daily controller medication(s): Symbicort 160/4.50mcg two puffs twice daily with spacer - Prior to physical activity: albuterol 2 puffs 10-15 minutes before physical activity. - Rescue medications: albuterol 4 puffs every 4-6 hours as needed - Asthma control goals:  * Full participation in all desired activities (may need albuterol before activity) * Albuterol use two time or less a week on average (not counting use with activity) * Cough interfering with sleep two time or less a month * Oral steroids no more than once a year * No hospitalizations  2. Perennial allergic rhinitis (grasses, ragweed, outdoor molds, dust mites, cockroach) - Continue with allergy shots at the same schedule.  - Continue with cetirizine 10 mg daily. - Continue with Flonase 1 to 2 sprays per nostril ONCE DAILY.  - Continue with Astelin 1 to 2 sprays per nostril ONCE DAILY.  - TRY getting off of the nose sprays to see how you do (do one spray for a week or two and then the other one after a couple of weeks).  - Use the nasal saline rinses religiously for the next month or so to stay ahead of these allergy symptoms.   3. Follow up in 6 months or earlier if needed.   Subjective:   Cheryl [name] is a 67 y.o. female  presenting today for follow up of  Chief Complaint  Patient presents with   Asthma    Says she os well.   Allergic Rhinitis     Says she can tell when her allergies are starting up but they are not as bad as before.     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 05/03/2013   Need for prophylactic vaccination and inoculation against influenza 02/08/2013   GERD (gastroesophageal reflux disease) 02/08/2013   Other and unspecified hyperlipidemia 11/10/2012   Type II or unspecified type diabetes mellitus without mention of complication, not stated as uncontrolled 10/25/2012   Impaired fasting glucose 09/07/2012   Morbid obesity (HCC) 09/07/2012   Generalized osteoarthrosis, involving multiple sites 09/07/2012   Carpal tunnel syndrome 09/07/2012   Other, multiple, and unspecified sites, insect bite, nonvenomous, without mention of infection(919.4) 09/07/2012   Special screening for malignant neoplasms, colon 09/07/2012   Other specified cardiac dysrhythmias(427.89) 09/07/2012   Candidiasis of vulva and vagina 09/07/2012   Abdominal pain,  generalized 09/07/2012   Symptomatic menopausal or female climacteric states 09/07/2012   Hypertension associated with diabetes (HCC) 09/07/2012   Perennial allergic rhinitis 09/07/2012   Pain in joint, site unspecified 09/07/2012   Other malaise and fatigue 09/07/2012    History obtained from: chart review and  patient.  Cheryl [name] is a 67 y.o. female presenting for a follow up visit. She was last seen in April 2024. At that time, we did not make any medications changes. We continued with Symbicort two puffs BID and albuterol as needed. We continued with her allergy shots as well as cetirizine and Flonase and Astelin.   Since last visit, she has done well.   Asthma/Respiratory Symptom History: She remains on her Symbicort. She is paying around $15 or less per month. This is currently. Insurance is changing at the beginning of the year.  She has not been using her albuterol at all. She is overall doing well. She has been on prednisone for her symptoms. She is fine with keeping the same regimen.   CPAP is working better. This is managed by Dr. Jetty Duhamel. They made some changes and now she falls asleep instantly.   Allergic Rhinitis Symptom History: She remains on the cetirizine daily. She is not using it as often as she was previously. She has the fluticasone that she takes once at night and the other one in the evening.   Cheryl [name] is on allergen immunotherapy. She receives two injections. Immunotherapy script #1 contains weeds, grasses, and dust mites. She currently receives 0.71mL of the GOLD vial (1/10,000). Immunotherapy script #2 contains molds and cockroach. She currently receives 0.36mL of the GOLD vial (1/10,000). She started shots January of 2024 and not yet reached maintenance. We restarted her allergy shots in January 2024 after we added grasses and molds. She had repeat testing in October 2023 which is why we remixed her shots. Her symptoms were not fully controlled on the previous script.   Otherwise, there have been no changes to her past medical history, surgical history, family history, or social history.    Review of Systems  Constitutional: Negative.  Negative for chills, fever, malaise/fatigue and weight loss.  HENT: Negative.  Negative for congestion, ear discharge, ear pain and sinus pain.    Eyes:  Negative for pain, discharge and redness.  Respiratory:  Negative for cough, sputum production, shortness of breath, wheezing and stridor.   Cardiovascular: Negative.  Negative for chest pain and palpitations.  Gastrointestinal:  Negative for abdominal pain, constipation, diarrhea, heartburn, nausea and vomiting.  Skin: Negative.  Negative for itching and rash.  Neurological:  Negative for dizziness and headaches.  Endo/Heme/Allergies:  Positive for environmental allergies. Does not bruise/bleed easily.       Objective:   Blood pressure 132/72, pulse 66, temperature 98.4 F (36.9 C), temperature source Temporal, resp. rate 18, height 5' 7.25" (1.708 m), weight 291 lb (132 kg), SpO2 98 %. Body mass index is 45.24 kg/m.    Physical Exam Vitals reviewed.  Constitutional:      Appearance: She is well-developed.     Comments: Lovely and smiling.   HENT:     Head: Normocephalic and atraumatic.     Right Ear: Tympanic membrane, ear canal and external ear normal.     Left Ear: Tympanic membrane, ear canal and external ear normal.     Nose: No nasal deformity, septal deviation, mucosal edema or rhinorrhea.     Right Turbinates: Pale. Not enlarged or swollen.  Left Turbinates: Pale. Not enlarged or swollen.     Right Sinus: No maxillary sinus tenderness or frontal sinus tenderness.     Left Sinus: No maxillary sinus tenderness or frontal sinus tenderness.     Mouth/Throat:     Mouth: Mucous membranes are not pale and not dry.     Pharynx: Uvula midline.  Eyes:     General: Lids are normal. Allergic shiner present.        Right eye: No discharge.        Left eye: No discharge.     Conjunctiva/sclera: Conjunctivae normal.     Right eye: Right conjunctiva is not injected. No chemosis.    Left eye: Left conjunctiva is not injected. No chemosis.    Pupils: Pupils are equal, round, and reactive to light.  Cardiovascular:     Rate and Rhythm: Normal rate and regular rhythm.      Heart sounds: Normal heart sounds.  Pulmonary:     Effort: Pulmonary effort is normal. No tachypnea, accessory muscle usage or respiratory distress.     Breath sounds: Normal breath sounds. No wheezing, rhonchi or rales.     Comments: Moving air well in all lung fields.  No increased work of breathing. Chest:     Chest wall: No tenderness.  Lymphadenopathy:     Cervical: No cervical adenopathy.  Skin:    Coloration: Skin is not pale.     Findings: No abrasion, erythema, petechiae or rash. Rash is not papular, urticarial or vesicular.  Neurological:     Mental Status: She is alert.  Psychiatric:        Behavior: Behavior is cooperative.      Diagnostic studies:    Spirometry: results normal (FEV1: 1.89/86%, FVC: 2.34/83%, FEV1/FVC: 81%).    Spirometry consistent with normal pattern.     Allergy Studies: none        Malachi Bonds, MD  [facility]

## 2022-10-28 NOTE — Patient Instructions (Addendum)
H1. Moderate persistent asthma, uncomplicated - Lung testing looked great today, despite the sounds that the machine made! :-)  - Daily controller medication(s): Symbicort 160/4.81mcg two puffs twice daily with spacer - Prior to physical activity: albuterol 2 puffs 10-15 minutes before physical activity. - Rescue medications: albuterol 4 puffs every 4-6 hours as needed - Asthma control goals:  * Full participation in all desired activities (may need albuterol before activity) * Albuterol use two time or less a week on average (not counting use with activity) * Cough interfering with sleep two time or less a month * Oral steroids no more than once a year * No hospitalizations  2. Perennial allergic rhinitis (grasses, ragweed, outdoor molds, dust mites, cockroach) - Continue with allergy shots at the same schedule.  - Continue with cetirizine 10 mg daily. - Continue with Flonase 1 to 2 sprays per nostril ONCE DAILY.  - Continue with Astelin 1 to 2 sprays per nostril ONCE DAILY.  - TRY getting off of the nose sprays to see how you do (do one spray for a week or two and then the other one after a couple of weeks).  - Use the nasal saline rinses religiously for the next month or so to stay ahead of these allergy symptoms.   3. Follow up in 6 months or earlier if needed.    Please inform us of any Emergency Department visits, hospitalizations, or changes in symptoms. Call us before going to the ED for breathing or allergy symptoms since we might be able to fit you in for a sick visit. Feel free to contact us anytime with any questions, problems, or concerns.  It was a pleasure to see you again today!  Websites that have reliable patient information: 1. American Academy of Asthma, Allergy, and Immunology: www.aaaai.org 2. Food Allergy Research and Education (FARE): foodallergy.org 3. Mothers of Asthmatics: http://www.asthmacommunitynetwork.org 4. American College of Allergy, Asthma, and  Immunology: www.acaai.org   COVID-19 Vaccine Information can be found at: [email or web address] For questions related to vaccine distribution or appointments, please email [email or web address] or call [phone].   We realize that you might be concerned about having an allergic reaction to the COVID19 vaccines. To help with that concern, WE ARE OFFERING THE COVID19 VACCINES IN OUR OFFICE! Ask the front desk for dates!     "Like" [place] on Facebook and Instagram for our latest updates!      A healthy democracy works best when [employer] participate! Make sure you are registered to vote! If you have moved or changed any of your contact information, you will need to get this updated before voting!  In some cases, you MAY be able to register to vote online: [email or web address]

## 2022-10-29 ENCOUNTER — Encounter: Payer: Self-pay | Admitting: Allergy & Immunology

## 2022-10-29 NOTE — Addendum Note (Signed)
Addended by: Philipp Deputy on: 10/29/2022 01:54 PM   Modules accepted: Orders

## 2022-11-03 ENCOUNTER — Encounter: Payer: Medicare Other | Attending: Internal Medicine | Admitting: Dietician

## 2022-11-03 ENCOUNTER — Encounter: Payer: Self-pay | Admitting: Dietician

## 2022-11-03 DIAGNOSIS — E119 Type 2 diabetes mellitus without complications: Secondary | ICD-10-CM | POA: Insufficient documentation

## 2022-11-03 NOTE — Patient Instructions (Signed)
Keep up the great work! Stay active!

## 2022-11-03 NOTE — Progress Notes (Signed)
Patient is here today alone.  She was last seen by this RD on 07/03/2022.  She is eating more fresh vegetables and fresh fruit. Eating less meat overall and especially trying to eat less pork. Drinks half a gallon of water daily Drinks a small amount of caffeine Walking 2 times per week for 15 minutes and states that she is staying very busy. Walked 12,000 steps one day on vacation (usual steps are >5,000 per day) Fasting blood glucose 113 this am and 120 after a meal.   History includes HTN, OSA on C-pap, glaucoma, type 2 diabetes "for years". Medications:  none for diabetes   Labs noted to include:  A1C 6% 10/03/2022 and 6.1% per patient 03/2022   Weight hx: 68" 293 lbs 11/03/2022 296 lbs 07/03/2022 296 lbs 03/31/2022 288 lbs 12/30/2021 290 lbs 2020 Lost 40 lbs in the past but regained after her mother's death.   Patient lives alone.  She is retired from [employer]  She used to weld. She owns a stationary bike and treadmill but does not like this. Walking most days of the week and doing PT exercises daily. Her son is vegetarian.  She does not eat red meat.

## 2022-11-04 ENCOUNTER — Ambulatory Visit (INDEPENDENT_AMBULATORY_CARE_PROVIDER_SITE_OTHER): Payer: Medicare Other

## 2022-11-04 DIAGNOSIS — J309 Allergic rhinitis, unspecified: Secondary | ICD-10-CM | POA: Diagnosis not present

## 2022-11-10 ENCOUNTER — Other Ambulatory Visit: Payer: Self-pay | Admitting: Internal Medicine

## 2022-11-10 ENCOUNTER — Other Ambulatory Visit: Payer: Self-pay | Admitting: Allergy & Immunology

## 2022-11-10 ENCOUNTER — Ambulatory Visit (INDEPENDENT_AMBULATORY_CARE_PROVIDER_SITE_OTHER): Payer: Medicare Other | Admitting: *Deleted

## 2022-11-10 DIAGNOSIS — J309 Allergic rhinitis, unspecified: Secondary | ICD-10-CM | POA: Diagnosis not present

## 2022-11-11 NOTE — Progress Notes (Signed)
EXP 11/12/23 

## 2022-11-12 DIAGNOSIS — J302 Other seasonal allergic rhinitis: Secondary | ICD-10-CM | POA: Diagnosis not present

## 2022-11-13 DIAGNOSIS — J3089 Other allergic rhinitis: Secondary | ICD-10-CM | POA: Diagnosis not present

## 2022-12-07 ENCOUNTER — Encounter: Payer: Self-pay | Admitting: Internal Medicine

## 2022-12-07 NOTE — Assessment & Plan Note (Signed)
Effects from CPAP with good compliance and control Plan-try increasing pressure range for comfort to auto 5-20

## 2022-12-07 NOTE — Assessment & Plan Note (Signed)
Good control now despite COVID infection and pollen rhinitis earlier in the spring. Plan-continue current meds

## 2022-12-09 DIAGNOSIS — J069 Acute upper respiratory infection, unspecified: Secondary | ICD-10-CM | POA: Diagnosis not present

## 2022-12-09 DIAGNOSIS — Z6841 Body Mass Index (BMI) 40.0 and over, adult: Secondary | ICD-10-CM | POA: Diagnosis not present

## 2022-12-22 ENCOUNTER — Other Ambulatory Visit: Payer: Self-pay | Admitting: Internal Medicine

## 2022-12-22 DIAGNOSIS — Z1231 Encounter for screening mammogram for malignant neoplasm of breast: Secondary | ICD-10-CM

## 2022-12-23 ENCOUNTER — Ambulatory Visit: Payer: Self-pay

## 2022-12-23 ENCOUNTER — Other Ambulatory Visit: Payer: Self-pay | Admitting: Allergy & Immunology

## 2022-12-23 DIAGNOSIS — J309 Allergic rhinitis, unspecified: Secondary | ICD-10-CM

## 2022-12-30 ENCOUNTER — Other Ambulatory Visit: Payer: Self-pay | Admitting: Internal Medicine

## 2022-12-30 ENCOUNTER — Ambulatory Visit (INDEPENDENT_AMBULATORY_CARE_PROVIDER_SITE_OTHER): Payer: Medicare Other

## 2022-12-30 ENCOUNTER — Ambulatory Visit
Admission: RE | Admit: 2022-12-30 | Discharge: 2022-12-30 | Disposition: A | Discharge status: Home / Self Care | Payer: Medicare Other | Source: Ambulatory Visit | Attending: Internal Medicine | Admitting: Internal Medicine

## 2022-12-30 DIAGNOSIS — J309 Allergic rhinitis, unspecified: Secondary | ICD-10-CM

## 2022-12-30 DIAGNOSIS — K219 Gastro-esophageal reflux disease without esophagitis: Secondary | ICD-10-CM

## 2022-12-30 DIAGNOSIS — Z1231 Encounter for screening mammogram for malignant neoplasm of breast: Secondary | ICD-10-CM

## 2023-01-01 ENCOUNTER — Ambulatory Visit: Payer: Medicare Other | Attending: Internal Medicine | Admitting: Internal Medicine

## 2023-01-01 ENCOUNTER — Encounter: Payer: Self-pay | Admitting: Internal Medicine

## 2023-01-01 VITALS — BP 123/71 | HR 74 | Temp 98.9°F | Ht 67.0 in | Wt 292.0 lb

## 2023-01-01 DIAGNOSIS — M5432 Sciatica, left side: Secondary | ICD-10-CM | POA: Insufficient documentation

## 2023-01-01 DIAGNOSIS — Z6841 Body Mass Index (BMI) 40.0 and over, adult: Secondary | ICD-10-CM | POA: Diagnosis not present

## 2023-01-01 DIAGNOSIS — J4540 Moderate persistent asthma, uncomplicated: Secondary | ICD-10-CM | POA: Insufficient documentation

## 2023-01-01 DIAGNOSIS — E669 Obesity, unspecified: Secondary | ICD-10-CM | POA: Diagnosis not present

## 2023-01-01 DIAGNOSIS — M79605 Pain in left leg: Secondary | ICD-10-CM | POA: Insufficient documentation

## 2023-01-01 DIAGNOSIS — E1159 Type 2 diabetes mellitus with other circulatory complications: Secondary | ICD-10-CM | POA: Diagnosis not present

## 2023-01-01 DIAGNOSIS — I152 Hypertension secondary to endocrine disorders: Secondary | ICD-10-CM | POA: Diagnosis not present

## 2023-01-01 DIAGNOSIS — M5386 Other specified dorsopathies, lumbar region: Secondary | ICD-10-CM

## 2023-01-01 DIAGNOSIS — G4733 Obstructive sleep apnea (adult) (pediatric): Secondary | ICD-10-CM | POA: Insufficient documentation

## 2023-01-01 MED ORDER — TRAMADOL HCL 50 MG PO TABS
50.0000 mg | ORAL_TABLET | Freq: Three times a day (TID) | ORAL | 0 refills | Status: AC | PRN
Start: 2023-01-01 — End: 2023-01-06

## 2023-01-01 MED ORDER — GABAPENTIN 300 MG PO CAPS
300.0000 mg | ORAL_CAPSULE | Freq: Every day | ORAL | 3 refills | Status: DC
Start: 2023-01-01 — End: 2023-05-21

## 2023-01-01 NOTE — Progress Notes (Signed)
Patient ID: Cheryl [name], female    DOB: 11-Jul-1955  MRN: [id number]  CC: Leg Pain (Pain on L leg X1 mo /Requesting an ortho )   Subjective: Cheryl [name] is a 67 y.o. female who presents for UC visit Her concerns today include:  Pt with hx of DM type 2, HTN, HL, moderate persistent asthma, OSA on CPAP, morbid obesity, arthritis/LBP, chronic pelvic pain syndrome/pelvic floor dysfunction followed by urology Dr. Robby Sermon   Pt c/o LT leg pain x 1 mth -started in lower back LT side then went to leg.  Pain lateral leg from hip to top of foot.  No back pain at this time.  The pain is in the leg. Constant if she does not take pain med.  Taking Nabumetone 500 mg BID PRN. Taking 3 times a day; Rates pain 8/10 when she is walking.  No pain if she is sitting.  Endorses pain when she lays down at night and worse with certain positions. No numbness or tingling but burning at times.   Knee would go out intermittently.  No recent falls. Using cane since pain started.   Had one inj to lower back LT side around April/May at [facility] for back pain; helped for about wk.  Had driven to [place] 6/24 and came back July 23rd.  Both legs were swollen the entire time.  Ate out a lot.  Swelling went down once she came back.  No swelling now Had MRI lumbar spine 05/05/22 that showed:  IMPRESSION: 1. Mild multilevel degenerative changes of the lumbar spine, slightly progressed at the L4-5 level where there is mild-to-moderate right and mild left foraminal stenosis. 2. No significant canal stenosis at any level.   Patient Active Problem List   Diagnosis Date Noted   Hyperlipidemia associated with type 2 diabetes mellitus (HCC) 01/25/2021   Type 2 diabetes mellitus with morbid obesity (HCC) 01/25/2021   Moderate persistent asthma, uncomplicated 11/04/2018   OSA (obstructive sleep apnea) 12/02/2016   Hyperlipidemia LDL goal <100 10/21/2014   Essential hypertension, benign 10/21/2014   DM w/o complication type  II (HCC) 09/13/2014   Routine general medical examination at a health care facility 10/12/2013   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 05/03/2013   Need for prophylactic vaccination and inoculation against influenza 02/08/2013   GERD (gastroesophageal reflux disease) 02/08/2013   Other and unspecified hyperlipidemia 11/10/2012   Type II or unspecified type diabetes mellitus without mention of complication, not stated as uncontrolled 10/25/2012   Impaired fasting glucose 09/07/2012   Morbid obesity (HCC) 09/07/2012   Generalized osteoarthrosis, involving multiple sites 09/07/2012   Carpal tunnel syndrome 09/07/2012   Other, multiple, and unspecified sites, insect bite, nonvenomous, without mention of infection(919.4) 09/07/2012   Special screening for malignant neoplasms, colon 09/07/2012   Other specified cardiac dysrhythmias(427.89) 09/07/2012   Candidiasis of vulva and vagina 09/07/2012   Abdominal pain, generalized 09/07/2012   Symptomatic menopausal or female climacteric states 09/07/2012   Hypertension associated with diabetes (HCC) 09/07/2012   Perennial allergic rhinitis 09/07/2012   Pain in joint, site unspecified 09/07/2012   Other malaise and fatigue 09/07/2012     Current Outpatient Medications on File Prior to Visit  Medication Sig Dispense Refill   acetaminophen (TYLENOL) 500 MG tablet Take 500 mg by mouth daily.     albuterol (VENTOLIN HFA) 108 (90 Base) MCG/ACT inhaler INHALE 2 PUFFS INTO THE LUNGS UP TO EVERY 6HRS AS NEEDED FOR WHEEZING/SHORTNESS OF BREATH 18 each 1  AMBULATORY NON FORMULARY MEDICATION Medication Name: Allergy Injection- every 4 weeks     aspirin 81 MG tablet Take 81 mg by mouth daily.     Azelastine HCl 137 MCG/SPRAY SOLN Place 1-2 sprays in each nostril once daily. 30 mL 5   B-D ULTRA-FINE 33 LANCETS MISC Use to test blood sugar once daily. Dx: E11.9 100 each 6   budesonide-formoterol (SYMBICORT) 160-4.5 MCG/ACT inhaler TAKE 2 PUFFS BY MOUTH  TWICE A DAY 30.6 each 5   Calcium Carbonate-Vitamin D 600-400 MG-UNIT chew tablet Chew 1 tablet by mouth daily.      EPINEPHRINE 0.3 mg/0.3 mL IJ SOAJ injection INJECT 0.3 MG INTO THE MUSCLE AS NEEDED FOR ANAPHYLAXIS. 2 each 1   fluticasone (FLONASE) 50 MCG/ACT nasal spray Place 1-2 sprays into both nostrils daily. 48 mL 2   levocetirizine (XYZAL) 5 MG tablet Take 1 tablet (5 mg total) by mouth every evening. 90 tablet 1   lisinopril (ZESTRIL) 10 MG tablet Take 1 tablet (10 mg total) by mouth daily. 90 tablet 1   Multiple Vitamin (MULTIVITAMIN ADULT PO) 1 tablet     nabumetone (RELAFEN) 500 MG tablet Take 500 mg by mouth daily. PRN     omeprazole (PRILOSEC) 40 MG capsule TAKE 1 CAPSULE (40 MG TOTAL) BY MOUTH DAILY. 90 capsule 0   Respiratory Therapy Supplies (CARETOUCH 2 CPAP HOSE HANGER) MISC      simvastatin (ZOCOR) 20 MG tablet TAKE 1 TABLET (20 MG) BY ORAL ROUTE ONCE DAILY IN THE EVENING FOR 90 DAYS 90 tablet 3   Spacer/Aero-Holding Chambers DEVI 1 each by Does not apply route as needed. 1 each 0   triamterene-hydrochlorothiazide (MAXZIDE) 75-50 MG tablet TAKE 1 TABLET BY MOUTH EVERY DAY 90 tablet 0   vitamin C (ASCORBIC ACID) 500 MG tablet Take 500 mg by mouth every other day.     No current facility-administered medications on file prior to visit.    Allergies  Allergen Reactions   Misc. Sulfonamide Containing Compounds Diarrhea   Elemental Sulfur Diarrhea   Oxycodone Nausea Only and Other (See Comments)    Had stomach and headache as side effect from medicine    Social History   Socioeconomic History   Marital status: Divorced    Spouse name: Not on file   Number of children: Not on file   Years of education: Not on file   Highest education level: Not on file  Occupational History   Not on file  Tobacco Use   Smoking status: Never   Smokeless tobacco: Never  Vaping Use   Vaping status: Never Used  Substance and Sexual Activity   Alcohol use: Yes    Comment: Seldom-  Wine    Drug use: No   Sexual activity: Not Currently    Comment: post office worker  Other Topics Concern   Not on file  Social History Narrative   Not on file   Social Determinants of Health   Financial Resource Strain: Low Risk  (11/13/2017)   Overall Financial Resource Strain (CARDIA)    Difficulty of Paying Living Expenses: Not hard at all  Food Insecurity: No Food Insecurity (11/13/2017)   Hunger Vital Sign    Worried About Running Out of Food in the Last Year: Never true    Ran Out of Food in the Last Year: Never true  Transportation Needs: No Transportation Needs (11/13/2017)   PRAPARE - Administrator, Civil Service (Medical): No    Lack of Transportation (  Non-Medical): No  Physical Activity: Low Risk  (05/02/2022)   Received from [facility]   PCARE Exercise SDOH    Exercise: Aerobic    PCare Exercise SDOH: Not on file    PCare Exercise SDOH: Not on file  Stress: Stress Concern Present (11/13/2017)   [employer] of Occupational Health - Occupational Stress Questionnaire    Feeling of Stress : To some extent  Social Connections: Moderately Isolated (11/13/2017)   Social Connection and Isolation Panel [NHANES]    Frequency of Communication with Friends and Family: More than three times a week    Frequency of Social Gatherings with Friends and Family: Never    Attends Religious Services: Never    Database administrator or Organizations: No    Attends Banker Meetings: Never    Marital Status: Never married  Intimate Partner Violence: Not At Risk (11/13/2017)   Humiliation, Afraid, Rape, and Kick questionnaire    Fear of Current or Ex-Partner: No    Emotionally Abused: No    Physically Abused: No    Sexually Abused: No    Family History  Problem Relation Age of Onset   Cancer Father    Diabetes Father    Allergic rhinitis Son    Diabetes Brother    Allergic rhinitis Son    Diabetes Paternal Aunt    Lung cancer Cousin     Asthma Neg Hx     Past Surgical History:  Procedure Laterality Date   ABDOMINAL HYSTERECTOMY     1994   carpal tunnel both hands     2012   CLOSED MANIPULATION SHOULDER Left 04/2014   EYE SURGERY Left 08/25/2015   EYE SURGERY Right 09/24/2015   JOINT REPLACEMENT     both knees replacement, 2010   mole removed from face     Nodule removed from back     SHOULDER SURGERY Left 11/2013   TONSILLECTOMY     as teenager    ROS: Review of Systems Negative except as stated above  PHYSICAL EXAM: BP 123/71 (BP Location: Left Arm, Patient Position: Sitting, Cuff Size: Large)   Pulse 74   Temp 98.9 F (37.2 C) (Oral)   Ht 5\' 7"  (1.702 m)   Wt 292 lb (132.5 kg)   SpO2 99%   BMI 45.73 kg/m   Physical Exam  General appearance - alert, well appearing, obese older African-American female and in no distress.  She ambulates with a single prong cane.  Gait is steady.  She gets on the exam table independently Mental status - normal mood, behavior, speech, dress, motor activity, and thought processes Neurological -power in lower extremities 5/5 proximally and distally.  Gross sensation intact in both lower extremities. Musculoskeletal -no tenderness on palpation of the lumbar spine.  Straight leg raise produces discomfort in the left leg.  No tenderness on palpation over the trochanteric bursa.  Discomfort with rotation of the hip. Ext: No edema in the legs.     Latest Ref Rng & Units 01/28/2022    4:29 PM 12/12/2021   10:41 AM 01/25/2021    2:38 PM  CMP  Glucose 70 - 99 mg/dL  295  92   BUN 8 - 27 mg/dL  14  12   Creatinine 2.84 - 1.00 mg/dL  1.32  4.40   Sodium 102 - 144 mmol/L  143  141   Potassium 3.5 - 5.2 mmol/L  4.4  4.4   Chloride 96 - 106 mmol/L  101  100   CO2 20 - 29 mmol/L  24  24   Calcium 8.7 - 10.3 mg/dL  41.3  24.4   Total Protein 6.0 - 8.5 g/dL 7.5   7.5   Total Bilirubin 0.0 - 1.2 mg/dL <0.1   0.3   Alkaline Phos 44 - 121 IU/L 69   68   AST 0 - 40 IU/L 19   24    ALT 0 - 32 IU/L 14   14    Lipid Panel     Component Value Date/Time   CHOL 167 01/28/2022 1629   TRIG 175 (H) 01/28/2022 1629   HDL 53 01/28/2022 1629   CHOLHDL 3.2 01/28/2022 1629   CHOLHDL 3.0 07/23/2018 1045   VLDL 20 11/17/2016 1007   LDLCALC 84 01/28/2022 1629   LDLCALC 86 07/23/2018 1045    CBC    Component Value Date/Time   WBC 8.6 01/28/2022 1629   WBC 9.4 11/13/2017 1025   RBC 4.94 01/28/2022 1629   RBC 4.80 11/13/2017 1025   HGB 13.7 01/28/2022 1629   HCT 41.1 01/28/2022 1629   PLT 354 01/28/2022 1629   MCV 83 01/28/2022 1629   MCH 27.7 01/28/2022 1629   MCH 26.9 (L) 11/13/2017 1025   MCHC 33.3 01/28/2022 1629   MCHC 33.3 11/13/2017 1025   RDW 14.6 01/28/2022 1629   LYMPHSABS 2.3 07/29/2018 1443   MONOABS 576 11/17/2016 1007   EOSABS 0.2 07/29/2018 1443   BASOSABS 0.0 07/29/2018 1443    ASSESSMENT AND PLAN: 1. Sciatica of left side associated with disorder of lumbar spine -Symptoms suggest that the issue is coming from her back rather than her knee.  I recommend that she contact her orthopedics at [facility] and try to get back in as soon as possible. Advised against taking Relafen more often than prescribed.  Given her age and history of hypertension, we will check BMP to make sure that this does not adversely affect her kidney function since she has been taking it over the past month.  I recommend taking it once a day. We will add a short course of tramadol to take as needed.  Patient advised that it is a controlled substance/narcotic medication which can cause some drowsiness.  Take as needed.  We will also add some gabapentin to take mainly at night. Referral submitted for some physical therapy. NCCSRS reviewed. - traMADol (ULTRAM) 50 MG tablet; Take 1 tablet (50 mg total) by mouth every 8 (eight) hours as needed for up to 5 days.  Dispense: 15 tablet; Refill: 0 - Ambulatory referral to Physical Therapy - gabapentin (NEURONTIN) 300 MG capsule; Take 1  capsule (300 mg total) by mouth at bedtime.  Dispense: 30 capsule; Refill: 3  2. Hypertension associated with diabetes (HCC) At goal. - Basic Metabolic Panel    Patient was given the opportunity to ask questions.  Patient verbalized understanding of the plan and was able to repeat key elements of the plan.   This documentation was completed using Paediatric nurse.  Any transcriptional errors are unintentional.  Orders Placed This Encounter  Procedures   Basic Metabolic Panel   Ambulatory referral to Physical Therapy     Requested Prescriptions   Signed Prescriptions Disp Refills   traMADol (ULTRAM) 50 MG tablet 15 tablet 0    Sig: Take 1 tablet (50 mg total) by mouth every 8 (eight) hours as needed for up to 5 days.   gabapentin (NEURONTIN) 300 MG capsule  30 capsule 3    Sig: Take 1 capsule (300 mg total) by mouth at bedtime.    Return if symptoms worsen or fail to improve.  Jonah Blue, MD, FACP

## 2023-01-01 NOTE — Patient Instructions (Addendum)
Decrease taking Nabumetone to one tab daily. Given short course of Tramadol to use as needed.  This is a narcotic medication that can cause drowsiness.   Take Gabapentin 300 mg at bedtime. Try to get back in with [facility].

## 2023-01-02 ENCOUNTER — Other Ambulatory Visit: Payer: Self-pay | Admitting: Allergy & Immunology

## 2023-01-07 DIAGNOSIS — M1612 Unilateral primary osteoarthritis, left hip: Secondary | ICD-10-CM | POA: Diagnosis not present

## 2023-01-07 DIAGNOSIS — M25562 Pain in left knee: Secondary | ICD-10-CM | POA: Diagnosis not present

## 2023-01-07 DIAGNOSIS — M4316 Spondylolisthesis, lumbar region: Secondary | ICD-10-CM | POA: Diagnosis not present

## 2023-01-07 DIAGNOSIS — M5451 Vertebrogenic low back pain: Secondary | ICD-10-CM | POA: Diagnosis not present

## 2023-01-12 ENCOUNTER — Ambulatory Visit (INDEPENDENT_AMBULATORY_CARE_PROVIDER_SITE_OTHER): Payer: Medicare Other | Admitting: *Deleted

## 2023-01-12 DIAGNOSIS — J309 Allergic rhinitis, unspecified: Secondary | ICD-10-CM | POA: Diagnosis not present

## 2023-01-14 ENCOUNTER — Ambulatory Visit: Payer: Medicare Other | Attending: Internal Medicine

## 2023-01-14 ENCOUNTER — Other Ambulatory Visit: Payer: Self-pay

## 2023-01-14 DIAGNOSIS — M5416 Radiculopathy, lumbar region: Secondary | ICD-10-CM | POA: Insufficient documentation

## 2023-01-14 DIAGNOSIS — M5386 Other specified dorsopathies, lumbar region: Secondary | ICD-10-CM | POA: Diagnosis not present

## 2023-01-14 NOTE — Therapy (Signed)
OUTPATIENT PHYSICAL THERAPY THORACOLUMBAR EVALUATION   Patient Name: Cheryl [name] MRN: [id number] DOB:[birth year], 67 y.o., female Today's Date: 01/14/2023  END OF SESSION:  PT End of Session - 01/14/23 1412     Visit Number 1    Number of Visits 13    Date for PT Re-Evaluation 03/11/23    Authorization Type MCR    PT Start Time 1403    PT Stop Time 1447    PT Time Calculation (min) 44 min    Activity Tolerance Patient tolerated treatment well    Behavior During Therapy WFL for tasks assessed/performed             Past Medical History:  Diagnosis Date   Allergic rhinitis, cause unspecified    Arthritis    Asthma    Carpal tunnel syndrome    Cataract    Diabetes mellitus without complication (HCC)    diet- controlled   GERD (gastroesophageal reflux disease)    Glaucoma    Hypertension    Impaired fasting glucose    Morbid obesity (HCC)    OSA (obstructive sleep apnea) 12/02/2016   Other malaise and fatigue    Other specified cardiac dysrhythmias(427.89)    Pain in joint, site unspecified    Symptomatic menopausal or female climacteric states    Syncope and collapse    Past Surgical History:  Procedure Laterality Date   ABDOMINAL HYSTERECTOMY     1994   carpal tunnel both hands     2012   CLOSED MANIPULATION SHOULDER Left 04/2014   EYE SURGERY Left 08/25/2015   EYE SURGERY Right 09/24/2015   JOINT REPLACEMENT     both knees replacement, 2010   mole removed from face     Nodule removed from back     SHOULDER SURGERY Left 11/2013   TONSILLECTOMY     as teenager   Patient Active Problem List   Diagnosis Date Noted   Hyperlipidemia associated with type 2 diabetes mellitus (HCC) 01/25/2021   Type 2 diabetes mellitus with morbid obesity (HCC) 01/25/2021   Moderate persistent asthma, uncomplicated 11/04/2018   OSA (obstructive sleep apnea) 12/02/2016   Hyperlipidemia LDL goal <100 10/21/2014   Essential hypertension, benign 10/21/2014   DM w/o complication  type II (HCC) 09/13/2014   Routine general medical examination at a health care facility 10/12/2013   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 05/03/2013   Need for prophylactic vaccination and inoculation against influenza 02/08/2013   GERD (gastroesophageal reflux disease) 02/08/2013   Other and unspecified hyperlipidemia 11/10/2012   Type II or unspecified type diabetes mellitus without mention of complication, not stated as uncontrolled 10/25/2012   Impaired fasting glucose 09/07/2012   Morbid obesity (HCC) 09/07/2012   Generalized osteoarthrosis, involving multiple sites 09/07/2012   Carpal tunnel syndrome 09/07/2012   Other, multiple, and unspecified sites, insect bite, nonvenomous, without mention of infection(919.4) 09/07/2012   Special screening for malignant neoplasms, colon 09/07/2012   Other specified cardiac dysrhythmias(427.89) 09/07/2012   Candidiasis of vulva and vagina 09/07/2012   Abdominal pain, generalized 09/07/2012   Symptomatic menopausal or female climacteric states 09/07/2012   Hypertension associated with diabetes (HCC) 09/07/2012   Perennial allergic rhinitis 09/07/2012   Pain in joint, site unspecified 09/07/2012   Other malaise and fatigue 09/07/2012    PCP: Marcine Matar, MD  REFERRING PROVIDER: Marcine Matar, MD  REFERRING DIAG: Sciatica of left side associated with disorder of lumbar spine [M53.86]   Rationale for Evaluation and Treatment:  Rehabilitation  THERAPY DIAG:  Radiculopathy, lumbar region  ONSET DATE: 1+ year   SUBJECTIVE:                                                                                                                                                                                           SUBJECTIVE STATEMENT:  Patient reports to PT with Lt LE pain that initial began with lumbar pain last year, that she received injections in December 2023 which were helpful. She states that her back is no longer  bothering her, but her L LE started hurting July 2024 when she was on vacation. "I was carrying my luggage upstairs and I think that bothered it." She has radiating L LE pain. She uses a SPC occasionally for ambulation.   PERTINENT HISTORY:  PMHx includes: Arthritis, Asthma, DM type II, HTN, Obesity, OSA, syncope, BIL TKR, essential HTN  PAIN:  Are you having pain? Yes: NPRS scale: 1-5/10 Pain location: Lt LE to the top of Lt foot  Pain description: burning, aching, "pain"  Aggravating factors: standing (>15-20 min), walking, stairs, heavy lifting/carrying  Relieving factors: injection, heating pad   PRECAUTIONS: None  RED FLAGS: None   WEIGHT BEARING RESTRICTIONS: No  FALLS:  Has patient fallen in last 6 months? No  LIVING ENVIRONMENT: Lives with: lives with their family and lives alone Lives in: House/apartment Stairs: No Has following equipment at home: Single point cane, Environmental consultant - 2 wheeled, shower chair, and Grab bars  OCCUPATION: n/a   PLOF: Independent  PATIENT GOALS: "I hope that I can get out in the park to walk, and to keep up with my 3 y.o. grandbaby."   NEXT MD VISIT: 02/03/23 PCP, 01/21/23    OBJECTIVE:   DIAGNOSTIC FINDINGS:  Had MRI lumbar spine 05/05/22 that showed:   IMPRESSION: 1. Mild multilevel degenerative changes of the lumbar spine, slightly progressed at the L4-5 level where there is mild-to-moderate right and mild left foraminal stenosis. 2. No significant canal stenosis at any level.  PATIENT SURVEYS:  FOTO 38 current, 53 predicted   COGNITION: Overall cognitive status: Within functional limits for tasks assessed     SENSATION: Not tested  PALPATION: Moderate pain with CPA from L3-S1, and moderate tenderness to palpation along Lt QL and lumbar paraspinals  LUMBAR ROM:   AROM eval  Flexion 80% Reported P! when returning to standing   Extension 50% , increased p!  Right lateral flexion 100%  Left lateral flexion 100%  Right  rotation   Left rotation    (Blank rows = not tested)   LOWER EXTREMITY MMT:    MMT Right eval  Left eval  Hip flexion 4+ 4  Hip extension    Hip abduction 4+ 4+  Hip adduction 5 5  Hip internal rotation    Hip external rotation    Knee flexion    Knee extension 5 5  Ankle dorsiflexion    Ankle plantarflexion    Ankle inversion    Ankle eversion      GAIT: Distance walked: 100 ft Assistive device utilized: None Level of assistance: Modified independence Comments: SPC, ambulates slowly with wide BOS, short step length     TODAY'S TREATMENT:                                                                                                                               [facility] Adult PT Treatment:                                                DATE: 01/14/2023   Initial evaluation: see patient education and home exercise program as noted below   PATIENT EDUCATION:  Education details: reviewed initial home exercise program; discussion of POC, prognosis and goals for skilled PT   Person educated: Patient Education method: Explanation, Demonstration, and Handouts Education comprehension: verbalized understanding, returned demonstration, and needs further education  HOME EXERCISE PROGRAM: Access Code: [email or web address] URL: [email or web address] Date: 01/14/2023 Prepared by: Mauri Reading  Exercises - Seated Lumbar Flexion Stretch  - 2 x daily - 7 x weekly - 2 sets - 10 reps - 3 sec hold - Seated Pelvic Tilt  - 2 x daily - 7 x weekly - 2 sets - 10 reps - 3 sec hold  ASSESSMENT:  CLINICAL IMPRESSION: Patient is a 67 y.o. female who was seen today for physical therapy evaluation and treatment for lumbar pain with radicular symptoms to L LE. She is demonstrating decreased lumbar AROM, decreased B LE strength, moderate tenderness with palpation of left lumbar musculature as well as lumbar mobility testing. She has related pain and difficulty with prolonged standing/walking,  stair climbing, heavy lifting, carrying, and difficulty participating in regular exercise. She requires skilled PT services at this time to address relevant deficits and improve overall function.     OBJECTIVE IMPAIRMENTS: decreased activity tolerance, decreased endurance, decreased mobility, decreased strength, impaired perceived functional ability, impaired UE functional use, improper body mechanics, postural dysfunction, and pain.   ACTIVITY LIMITATIONS: carrying, lifting, bending, standing, squatting, stairs, transfers, and hygiene/grooming  PARTICIPATION LIMITATIONS: meal prep, cleaning, laundry, shopping, community activity, and occupation  PERSONAL FACTORS: Past/current experiences, Time since onset of injury/illness/exacerbation, and 3+ comorbidities: PMHx includes: Arthritis, Asthma, DM type II, HTN, Obesity, OSA, syncope, BIL TKR, essential HTN  are also affecting patient's functional outcome.   REHAB POTENTIAL: Fair    CLINICAL DECISION MAKING: Evolving/moderate complexity  EVALUATION COMPLEXITY: Moderate   GOALS: Goals reviewed with  patient? Yes  SHORT TERM GOALS: Target date: 02/11/2023   Patient will be independent with initial home program for LS mobility and LQ strengthening.  Baseline: provided at eval  Goal status: INITIAL    LONG TERM GOALS: Target date: 03/11/2023   Patient will demonstrate improved LS AROM to at least 75% extension.   Baseline:  Lumbar AROM eval  Flexion 80% Reported P! when returning to standing   Extension 50% , increased p!  Right lateral flexion 100%  Left lateral flexion 100%   Goal status: INITIAL  2.  Patient will demonstrate increased LE MMT to 5/5 bilaterally.  Baseline:  LE MMT Right eval Left eval  Hip flexion 4+ 4  Hip abduction 4+ 4+  Hip adduction 5 5  Knee extension 5 5   Goal status: INITIAL  3.  Patient will report improved overall functional ability with FOTO score of 53 or greater.   Baseline: 38  current Goal status: INITIAL  4.  Patient will report improved standing and walking tolerance to 30 minutes or greater for regular walking program and performance of household tasks.  Baseline: 15-20 minutes  Goal status: INITIAL  5.  Patient will demonstrate ability to ascend/descend stairs x 15 without exacerbation of symptoms.  Baseline: moderate difficulty  Goal status: INITIAL  6.  Patient will demonstrate ability to perform floor to waist lifting of at least 20# using appropriate body mechanics and with no more than minimal pain in order to safely perform normal daily/occupational tasks.   Baseline: unable  Goal status: INITIAL  PLAN:  PT FREQUENCY: 2x/week  PT DURATION: 6 weeks  PLANNED INTERVENTIONS: Therapeutic exercises, Therapeutic activity, Neuromuscular re-education, Patient/Family education, Self Care, Joint mobilization, Aquatic Therapy, Dry Needling, Electrical stimulation, Cryotherapy, Moist heat, Taping, Traction, Manual therapy, and Re-evaluation.  PLAN FOR NEXT SESSION: repeated and sustained lumbar motions for pain centralization and relief (begin with flexion and/or sidebending), core strength & endurance, manual therapy as indicated, aerobic activities for pain modulation, nerve glides, lumbar and LE mobility   Mauri Reading, PT, DPT 01/14/2023, 4:56 PM

## 2023-01-19 ENCOUNTER — Ambulatory Visit (INDEPENDENT_AMBULATORY_CARE_PROVIDER_SITE_OTHER): Payer: Medicare Other | Admitting: *Deleted

## 2023-01-19 DIAGNOSIS — J309 Allergic rhinitis, unspecified: Secondary | ICD-10-CM | POA: Diagnosis not present

## 2023-01-20 ENCOUNTER — Ambulatory Visit: Payer: Medicare Other

## 2023-01-20 DIAGNOSIS — M5386 Other specified dorsopathies, lumbar region: Secondary | ICD-10-CM | POA: Diagnosis not present

## 2023-01-20 DIAGNOSIS — M5416 Radiculopathy, lumbar region: Secondary | ICD-10-CM | POA: Diagnosis not present

## 2023-01-20 NOTE — Therapy (Signed)
OUTPATIENT PHYSICAL THERAPY TREATMENT NOTE   Patient Name: Cheryl [name] MRN: [id number] DOB:Feb 18, 1956, 67 y.o., female Today's Date: 01/20/2023  END OF SESSION:  PT End of Session - 01/20/23 1433     Visit Number 2    Number of Visits 13    Date for PT Re-Evaluation 03/11/23    Authorization Type MCR    PT Start Time 1440    PT Stop Time 1520    PT Time Calculation (min) 40 min    Activity Tolerance Patient tolerated treatment well    Behavior During Therapy WFL for tasks assessed/performed              Past Medical History:  Diagnosis Date   Allergic rhinitis, cause unspecified    Arthritis    Asthma    Carpal tunnel syndrome    Cataract    Diabetes mellitus without complication (HCC)    diet- controlled   GERD (gastroesophageal reflux disease)    Glaucoma    Hypertension    Impaired fasting glucose    Morbid obesity (HCC)    OSA (obstructive sleep apnea) 12/02/2016   Other malaise and fatigue    Other specified cardiac dysrhythmias(427.89)    Pain in joint, site unspecified    Symptomatic menopausal or female climacteric states    Syncope and collapse    Past Surgical History:  Procedure Laterality Date   ABDOMINAL HYSTERECTOMY     1994   carpal tunnel both hands     2012   CLOSED MANIPULATION SHOULDER Left 04/2014   EYE SURGERY Left 08/25/2015   EYE SURGERY Right 09/24/2015   JOINT REPLACEMENT     both knees replacement, 2010   mole removed from face     Nodule removed from back     SHOULDER SURGERY Left 11/2013   TONSILLECTOMY     as teenager   Patient Active Problem List   Diagnosis Date Noted   Hyperlipidemia associated with type 2 diabetes mellitus (HCC) 01/25/2021   Type 2 diabetes mellitus with morbid obesity (HCC) 01/25/2021   Moderate persistent asthma, uncomplicated 11/04/2018   OSA (obstructive sleep apnea) 12/02/2016   Hyperlipidemia LDL goal <100 10/21/2014   Essential hypertension, benign 10/21/2014   DM w/o complication type II  (HCC) 09/13/2014   Routine general medical examination at a health care facility 10/12/2013   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 05/03/2013   Need for prophylactic vaccination and inoculation against influenza 02/08/2013   GERD (gastroesophageal reflux disease) 02/08/2013   Other and unspecified hyperlipidemia 11/10/2012   Type II or unspecified type diabetes mellitus without mention of complication, not stated as uncontrolled 10/25/2012   Impaired fasting glucose 09/07/2012   Morbid obesity (HCC) 09/07/2012   Generalized osteoarthrosis, involving multiple sites 09/07/2012   Carpal tunnel syndrome 09/07/2012   Other, multiple, and unspecified sites, insect bite, nonvenomous, without mention of infection(919.4) 09/07/2012   Special screening for malignant neoplasms, colon 09/07/2012   Other specified cardiac dysrhythmias(427.89) 09/07/2012   Candidiasis of vulva and vagina 09/07/2012   Abdominal pain, generalized 09/07/2012   Symptomatic menopausal or female climacteric states 09/07/2012   Hypertension associated with diabetes (HCC) 09/07/2012   Perennial allergic rhinitis 09/07/2012   Pain in joint, site unspecified 09/07/2012   Other malaise and fatigue 09/07/2012    PCP: Marcine Matar, MD  REFERRING PROVIDER: Marcine Matar, MD  REFERRING DIAG: Sciatica of left side associated with disorder of lumbar spine [M53.86]   Rationale for Evaluation and  Treatment: Rehabilitation  THERAPY DIAG:  Radiculopathy, lumbar region  ONSET DATE: 1+ year   SUBJECTIVE:                                                                                                                                                                                           SUBJECTIVE STATEMENT: Patient reports that she walked around downtown for about 15 minutes prior to PT session. She states that she sees her MD tomorrow and might be getting an injection in her back.   PERTINENT HISTORY:   PMHx includes: Arthritis, Asthma, DM type II, HTN, Obesity, OSA, syncope, BIL TKR, essential HTN  PAIN:  Are you having pain? Yes: NPRS scale: 5/10 Pain location: Lt LE to the top of Lt foot  Pain description: burning, aching, "pain"  Aggravating factors: standing (>15-20 min), walking, stairs, heavy lifting/carrying  Relieving factors: injection, heating pad   PRECAUTIONS: None  RED FLAGS: None   WEIGHT BEARING RESTRICTIONS: No  FALLS:  Has patient fallen in last 6 months? No  LIVING ENVIRONMENT: Lives with: lives with their family and lives alone Lives in: House/apartment Stairs: No Has following equipment at home: Single point cane, Environmental consultant - 2 wheeled, shower chair, and Grab bars  OCCUPATION: n/a   PLOF: Independent  PATIENT GOALS: "I hope that I can get out in the park to walk, and to keep up with my 3 y.o. grandbaby."   NEXT MD VISIT: 02/03/23 PCP, 01/21/23    OBJECTIVE:   DIAGNOSTIC FINDINGS:  Had MRI lumbar spine 05/05/22 that showed:   IMPRESSION: 1. Mild multilevel degenerative changes of the lumbar spine, slightly progressed at the L4-5 level where there is mild-to-moderate right and mild left foraminal stenosis. 2. No significant canal stenosis at any level.  PATIENT SURVEYS:  FOTO 38 current, 53 predicted   COGNITION: Overall cognitive status: Within functional limits for tasks assessed     SENSATION: Not tested  PALPATION: Moderate pain with CPA from L3-S1, and moderate tenderness to palpation along Lt QL and lumbar paraspinals  LUMBAR ROM:   AROM eval  Flexion 80% Reported P! when returning to standing   Extension 50% , increased p!  Right lateral flexion 100%  Left lateral flexion 100%  Right rotation   Left rotation    (Blank rows = not tested)   LOWER EXTREMITY MMT:    MMT Right eval Left eval  Hip flexion 4+ 4  Hip extension    Hip abduction 4+ 4+  Hip adduction 5 5  Hip internal rotation    Hip external rotation     Knee flexion    Knee extension  5 5  Ankle dorsiflexion    Ankle plantarflexion    Ankle inversion    Ankle eversion      GAIT: Distance walked: 100 ft Assistive device utilized: None Level of assistance: Modified independence Comments: SPC, ambulates slowly with wide BOS, short step length     TODAY'S TREATMENT:    [facility] Adult PT Treatment:                                                DATE: 01/20/23 Therapeutic Exercise: Nustep level 5 x 5 mins while gathering subjective Standing hip abduction/extension x10 ea BIL Step ups 4" x10 BIL fwd/lat Seated hamstring stretch 2x30" BIL Seated pball roll outs fwd/lat x10 ea (limited by Lt shoulder ROM) Supine PPT 3" hold 2x10 Bridges with ball 2x10 LTR x10 BIL STS from elevated table x5 - pain in Lt hip/lateral thigh                                 [facility] Adult PT Treatment:                                                DATE: 01/14/2023   Initial evaluation: see patient education and home exercise program as noted below   PATIENT EDUCATION:  Education details: reviewed initial home exercise program; discussion of POC, prognosis and goals for skilled PT   Person educated: Patient Education method: Explanation, Demonstration, and Handouts Education comprehension: verbalized understanding, returned demonstration, and needs further education  HOME EXERCISE PROGRAM: Access Code: [email or web address] URL: [email or web address] Date: 01/14/2023 Prepared by: Mauri Reading  Exercises - Seated Lumbar Flexion Stretch  - 2 x daily - 7 x weekly - 2 sets - 10 reps - 3 sec hold - Seated Pelvic Tilt  - 2 x daily - 7 x weekly - 2 sets - 10 reps - 3 sec hold  ASSESSMENT:  CLINICAL IMPRESSION: Patient presents to PT reporting continued LLE pain and that she walked for about 15 minutes prior to PT. Session today focused on LE strengthening and lumbar mobility, particularly into flexion. She is limited in pball roll outs due to shoulder ROM  on Lt. Patient was able to tolerate all prescribed exercises with no adverse effects. Patient continues to benefit from skilled PT services and should be progressed as able to improve functional independence.    OBJECTIVE IMPAIRMENTS: decreased activity tolerance, decreased endurance, decreased mobility, decreased strength, impaired perceived functional ability, impaired UE functional use, improper body mechanics, postural dysfunction, and pain.   ACTIVITY LIMITATIONS: carrying, lifting, bending, standing, squatting, stairs, transfers, and hygiene/grooming  PARTICIPATION LIMITATIONS: meal prep, cleaning, laundry, shopping, community activity, and occupation  PERSONAL FACTORS: Past/current experiences, Time since onset of injury/illness/exacerbation, and 3+ comorbidities: PMHx includes: Arthritis, Asthma, DM type II, HTN, Obesity, OSA, syncope, BIL TKR, essential HTN  are also affecting patient's functional outcome.   REHAB POTENTIAL: Fair    CLINICAL DECISION MAKING: Evolving/moderate complexity  EVALUATION COMPLEXITY: Moderate   GOALS: Goals reviewed with patient? Yes  SHORT TERM GOALS: Target date: 02/11/2023   Patient will be independent with initial home program for LS mobility and LQ strengthening.  Baseline: provided at  eval  Goal status: INITIAL    LONG TERM GOALS: Target date: 03/11/2023   Patient will demonstrate improved LS AROM to at least 75% extension.   Baseline:  Lumbar AROM eval  Flexion 80% Reported P! when returning to standing   Extension 50% , increased p!  Right lateral flexion 100%  Left lateral flexion 100%   Goal status: INITIAL  2.  Patient will demonstrate increased LE MMT to 5/5 bilaterally.  Baseline:  LE MMT Right eval Left eval  Hip flexion 4+ 4  Hip abduction 4+ 4+  Hip adduction 5 5  Knee extension 5 5   Goal status: INITIAL  3.  Patient will report improved overall functional ability with FOTO score of 53 or greater.    Baseline: 38 current Goal status: INITIAL  4.  Patient will report improved standing and walking tolerance to 30 minutes or greater for regular walking program and performance of household tasks.  Baseline: 15-20 minutes  Goal status: INITIAL  5.  Patient will demonstrate ability to ascend/descend stairs x 15 without exacerbation of symptoms.  Baseline: moderate difficulty  Goal status: INITIAL  6.  Patient will demonstrate ability to perform floor to waist lifting of at least 20# using appropriate body mechanics and with no more than minimal pain in order to safely perform normal daily/occupational tasks.   Baseline: unable  Goal status: INITIAL  PLAN:  PT FREQUENCY: 2x/week  PT DURATION: 6 weeks  PLANNED INTERVENTIONS: Therapeutic exercises, Therapeutic activity, Neuromuscular re-education, Patient/Family education, Self Care, Joint mobilization, Aquatic Therapy, Dry Needling, Electrical stimulation, Cryotherapy, Moist heat, Taping, Traction, Manual therapy, and Re-evaluation.  PLAN FOR NEXT SESSION: repeated and sustained lumbar motions for pain centralization and relief (begin with flexion and/or sidebending), core strength & endurance, manual therapy as indicated, aerobic activities for pain modulation, nerve glides, lumbar and LE mobility   Berta Minor, PTA 01/20/2023, 3:21 PM

## 2023-01-21 DIAGNOSIS — M47816 Spondylosis without myelopathy or radiculopathy, lumbar region: Secondary | ICD-10-CM | POA: Diagnosis not present

## 2023-01-21 DIAGNOSIS — M4316 Spondylolisthesis, lumbar region: Secondary | ICD-10-CM | POA: Diagnosis not present

## 2023-01-22 ENCOUNTER — Ambulatory Visit: Payer: Medicare Other | Admitting: Physical Therapy

## 2023-01-22 ENCOUNTER — Encounter: Payer: Self-pay | Admitting: Physical Therapy

## 2023-01-22 DIAGNOSIS — M5416 Radiculopathy, lumbar region: Secondary | ICD-10-CM

## 2023-01-22 DIAGNOSIS — M5386 Other specified dorsopathies, lumbar region: Secondary | ICD-10-CM | POA: Diagnosis not present

## 2023-01-22 NOTE — Therapy (Signed)
OUTPATIENT PHYSICAL THERAPY TREATMENT NOTE   Patient Name: Cheryl [name] MRN: [id number] DOB:09/05/1955, 67 y.o., female Today's Date: 01/22/2023  END OF SESSION:  PT End of Session - 01/22/23 1146     Visit Number 3    Number of Visits 13    Date for PT Re-Evaluation 03/11/23    Authorization Type MCR    Progress Note Due on Visit 10    PT Start Time 1146    PT Stop Time 1230    PT Time Calculation (min) 44 min    Activity Tolerance Patient tolerated treatment well    Behavior During Therapy WFL for tasks assessed/performed               Past Medical History:  Diagnosis Date   Allergic rhinitis, cause unspecified    Arthritis    Asthma    Carpal tunnel syndrome    Cataract    Diabetes mellitus without complication (HCC)    diet- controlled   GERD (gastroesophageal reflux disease)    Glaucoma    Hypertension    Impaired fasting glucose    Morbid obesity (HCC)    OSA (obstructive sleep apnea) 12/02/2016   Other malaise and fatigue    Other specified cardiac dysrhythmias(427.89)    Pain in joint, site unspecified    Symptomatic menopausal or female climacteric states    Syncope and collapse    Past Surgical History:  Procedure Laterality Date   ABDOMINAL HYSTERECTOMY     1994   carpal tunnel both hands     2012   CLOSED MANIPULATION SHOULDER Left 04/2014   EYE SURGERY Left 08/25/2015   EYE SURGERY Right 09/24/2015   JOINT REPLACEMENT     both knees replacement, 2010   mole removed from face     Nodule removed from back     SHOULDER SURGERY Left 11/2013   TONSILLECTOMY     as teenager   Patient Active Problem List   Diagnosis Date Noted   Hyperlipidemia associated with type 2 diabetes mellitus (HCC) 01/25/2021   Type 2 diabetes mellitus with morbid obesity (HCC) 01/25/2021   Moderate persistent asthma, uncomplicated 11/04/2018   OSA (obstructive sleep apnea) 12/02/2016   Hyperlipidemia LDL goal <100 10/21/2014   Essential hypertension, benign  10/21/2014   DM w/o complication type II (HCC) 09/13/2014   Routine general medical examination at a health care facility 10/12/2013   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 05/03/2013   Need for prophylactic vaccination and inoculation against influenza 02/08/2013   GERD (gastroesophageal reflux disease) 02/08/2013   Other and unspecified hyperlipidemia 11/10/2012   Type II or unspecified type diabetes mellitus without mention of complication, not stated as uncontrolled 10/25/2012   Impaired fasting glucose 09/07/2012   Morbid obesity (HCC) 09/07/2012   Generalized osteoarthrosis, involving multiple sites 09/07/2012   Carpal tunnel syndrome 09/07/2012   Other, multiple, and unspecified sites, insect bite, nonvenomous, without mention of infection(919.4) 09/07/2012   Special screening for malignant neoplasms, colon 09/07/2012   Other specified cardiac dysrhythmias(427.89) 09/07/2012   Candidiasis of vulva and vagina 09/07/2012   Abdominal pain, generalized 09/07/2012   Symptomatic menopausal or female climacteric states 09/07/2012   Hypertension associated with diabetes (HCC) 09/07/2012   Perennial allergic rhinitis 09/07/2012   Pain in joint, site unspecified 09/07/2012   Other malaise and fatigue 09/07/2012    PCP: Marcine Matar, MD  REFERRING PROVIDER: Marcine Matar, MD  REFERRING DIAG: Sciatica of left side associated with disorder  of lumbar spine [M53.86]   Rationale for Evaluation and Treatment: Rehabilitation  THERAPY DIAG:  Radiculopathy, lumbar region  ONSET DATE: 1+ year   SUBJECTIVE:                                                                                                                                                                                           SUBJECTIVE STATEMENT: States she went to the doctor yesterday and they were pleased with her progress, seems like her pain is improving notably. didn't get an injection yesterday. Is  taking care of her grandchild this weekend. No pain at present, no longer having constant pain.      PERTINENT HISTORY:  PMHx includes: Arthritis, Asthma, DM type II, HTN, Obesity, OSA, syncope, BIL TKR, essential HTN  PAIN:  Are you having pain? See subjective above Pain location: Lt LE to the top of Lt foot  Pain description: burning, aching, "pain"  Aggravating factors: standing (>15-20 min), walking, stairs, heavy lifting/carrying  Relieving factors: injection, heating pad   PRECAUTIONS: None  RED FLAGS: None   WEIGHT BEARING RESTRICTIONS: No  FALLS:  Has patient fallen in last 6 months? No  LIVING ENVIRONMENT: Lives with: lives with their family and lives alone Lives in: House/apartment Stairs: No Has following equipment at home: Single point cane, Environmental consultant - 2 wheeled, shower chair, and Grab bars  OCCUPATION: n/a   PLOF: Independent  PATIENT GOALS: "I hope that I can get out in the park to walk, and to keep up with my 3 y.o. grandbaby."   NEXT MD VISIT: 02/03/23 PCP, 01/21/23    OBJECTIVE: (objective measures completed at initial evaluation unless otherwise dated)   DIAGNOSTIC FINDINGS:  Had MRI lumbar spine 05/05/22 that showed:   IMPRESSION: 1. Mild multilevel degenerative changes of the lumbar spine, slightly progressed at the L4-5 level where there is mild-to-moderate right and mild left foraminal stenosis. 2. No significant canal stenosis at any level.  PATIENT SURVEYS:  FOTO 38 current, 53 predicted   COGNITION: Overall cognitive status: Within functional limits for tasks assessed     SENSATION: Not tested  PALPATION: Moderate pain with CPA from L3-S1, and moderate tenderness to palpation along Lt QL and lumbar paraspinals  LUMBAR ROM:   AROM eval  Flexion 80% Reported P! when returning to standing   Extension 50% , increased p!  Right lateral flexion 100%  Left lateral flexion 100%  Right rotation   Left rotation    (Blank rows = not  tested)   LOWER EXTREMITY MMT:    MMT Right eval Left eval  Hip flexion 4+ 4  Hip extension  Hip abduction 4+ 4+  Hip adduction 5 5  Hip internal rotation    Hip external rotation    Knee flexion    Knee extension 5 5  Ankle dorsiflexion    Ankle plantarflexion    Ankle inversion    Ankle eversion      GAIT: Distance walked: 100 ft Assistive device utilized: None Level of assistance: Modified independence Comments: SPC, ambulates slowly with wide BOS, short step length     TODAY'S TREATMENT:    [facility] Adult PT Treatment:                                                DATE: 01/22/23 Therapeutic Exercise: Nu step 5 min LE/UE  Standing hip abduction x10 BIL, x8 with red band Standing hip extension x10 BIL, x8 with red band  STS from raised mat 2x8 Bridge x10 cues for breath control LTR x10 BIL cues for comfortable ROM and breath control  Swiss ball rollout (fwd only) x10 (limited ROM d/t LUE, nonpainful) Verbal HEP review/discussion at end of session    [facility] Adult PT Treatment:                                                DATE: 01/20/23 Therapeutic Exercise: Nustep level 5 x 5 mins while gathering subjective Standing hip abduction/extension x10 ea BIL Step ups 4" x10 BIL fwd/lat Seated hamstring stretch 2x30" BIL Seated pball roll outs fwd/lat x10 ea (limited by Lt shoulder ROM) Supine PPT 3" hold 2x10 Bridges with ball 2x10 LTR x10 BIL STS from elevated table x5 - pain in Lt hip/lateral thigh                                 [facility] Adult PT Treatment:                                                DATE: 01/14/2023   Initial evaluation: see patient education and home exercise program as noted below   PATIENT EDUCATION:  Education details: rationale for interventions, HEP  Person educated: Patient Education method: Explanation, Demonstration, and Handouts Education comprehension: verbalized understanding, returned demonstration, and needs further  education  HOME EXERCISE PROGRAM: Access Code: [email or web address] URL: [email or web address] Date: 01/14/2023 Prepared by: Mauri Reading  Exercises - Seated Lumbar Flexion Stretch  - 2 x daily - 7 x weekly - 2 sets - 10 reps - 3 sec hold - Seated Pelvic Tilt  - 2 x daily - 7 x weekly - 2 sets - 10 reps - 3 sec hold  ASSESSMENT:  CLINICAL IMPRESSION: Pt arrives and denies any resting pain, notes exercises seem helpful and her doctor visit went well. States she did not receive any injections. Continuing to progress well with familiar exercises, noted improvement in STS tolerance even with increase in volume. Fatigues quickly with addition of resistance to hip strengthening but denies any increase in pain. Departs with report of muscular fatigue, denies any pain. Recommend continuing along current POC in order to address  relevant deficits and improve functional tolerance. Pt departs today's session in no acute distress, all voiced questions/concerns addressed appropriately from PT perspective.      OBJECTIVE IMPAIRMENTS: decreased activity tolerance, decreased endurance, decreased mobility, decreased strength, impaired perceived functional ability, impaired UE functional use, improper body mechanics, postural dysfunction, and pain.   ACTIVITY LIMITATIONS: carrying, lifting, bending, standing, squatting, stairs, transfers, and hygiene/grooming  PARTICIPATION LIMITATIONS: meal prep, cleaning, laundry, shopping, community activity, and occupation  PERSONAL FACTORS: Past/current experiences, Time since onset of injury/illness/exacerbation, and 3+ comorbidities: PMHx includes: Arthritis, Asthma, DM type II, HTN, Obesity, OSA, syncope, BIL TKR, essential HTN  are also affecting patient's functional outcome.   REHAB POTENTIAL: Fair    CLINICAL DECISION MAKING: Evolving/moderate complexity  EVALUATION COMPLEXITY: Moderate   GOALS: Goals reviewed with patient? Yes  SHORT TERM GOALS:  Target date: 02/11/2023   Patient will be independent with initial home program for LS mobility and LQ strengthening.  Baseline: provided at eval  Goal status: INITIAL    LONG TERM GOALS: Target date: 03/11/2023   Patient will demonstrate improved LS AROM to at least 75% extension.   Baseline:  Lumbar AROM eval  Flexion 80% Reported P! when returning to standing   Extension 50% , increased p!  Right lateral flexion 100%  Left lateral flexion 100%   Goal status: INITIAL  2.  Patient will demonstrate increased LE MMT to 5/5 bilaterally.  Baseline:  LE MMT Right eval Left eval  Hip flexion 4+ 4  Hip abduction 4+ 4+  Hip adduction 5 5  Knee extension 5 5   Goal status: INITIAL  3.  Patient will report improved overall functional ability with FOTO score of 53 or greater.   Baseline: 38 current Goal status: INITIAL  4.  Patient will report improved standing and walking tolerance to 30 minutes or greater for regular walking program and performance of household tasks.  Baseline: 15-20 minutes  Goal status: INITIAL  5.  Patient will demonstrate ability to ascend/descend stairs x 15 without exacerbation of symptoms.  Baseline: moderate difficulty  Goal status: INITIAL  6.  Patient will demonstrate ability to perform floor to waist lifting of at least 20# using appropriate body mechanics and with no more than minimal pain in order to safely perform normal daily/occupational tasks.   Baseline: unable  Goal status: INITIAL  PLAN:  PT FREQUENCY: 2x/week  PT DURATION: 6 weeks  PLANNED INTERVENTIONS: Therapeutic exercises, Therapeutic activity, Neuromuscular re-education, Patient/Family education, Self Care, Joint mobilization, Aquatic Therapy, Dry Needling, Electrical stimulation, Cryotherapy, Moist heat, Taping, Traction, Manual therapy, and Re-evaluation.  PLAN FOR NEXT SESSION: repeated and sustained lumbar motions for pain centralization and relief (begin with flexion  and/or sidebending), core strength & endurance, manual therapy as indicated, aerobic activities for pain modulation, nerve glides, lumbar and LE mobility     Ashley Murrain PT, DPT 01/22/2023 12:37 PM

## 2023-01-27 ENCOUNTER — Ambulatory Visit: Payer: Medicare Other | Admitting: Orthopaedic Surgery

## 2023-01-27 NOTE — Therapy (Signed)
OUTPATIENT PHYSICAL THERAPY TREATMENT NOTE   Patient Name: Cheryl [name] MRN: [id number] DOB:[birth year], 67 y.o., female Today's Date: 01/28/2023  END OF SESSION:  PT End of Session - 01/28/23 1305     Visit Number 4    Number of Visits 13    Date for PT Re-Evaluation 03/11/23    Authorization Type MCR    Progress Note Due on Visit 10    PT Start Time 1310    PT Stop Time 1355    PT Time Calculation (min) 45 min    Activity Tolerance Patient tolerated treatment well;No increased pain    Behavior During Therapy WFL for tasks assessed/performed                Past Medical History:  Diagnosis Date   Allergic rhinitis, cause unspecified    Arthritis    Asthma    Carpal tunnel syndrome    Cataract    Diabetes mellitus without complication (HCC)    diet- controlled   GERD (gastroesophageal reflux disease)    Glaucoma    Hypertension    Impaired fasting glucose    Morbid obesity (HCC)    OSA (obstructive sleep apnea) 12/02/2016   Other malaise and fatigue    Other specified cardiac dysrhythmias(427.89)    Pain in joint, site unspecified    Symptomatic menopausal or female climacteric states    Syncope and collapse    Past Surgical History:  Procedure Laterality Date   ABDOMINAL HYSTERECTOMY     1994   carpal tunnel both hands     2012   CLOSED MANIPULATION SHOULDER Left 04/2014   EYE SURGERY Left 08/25/2015   EYE SURGERY Right 09/24/2015   JOINT REPLACEMENT     both knees replacement, 2010   mole removed from face     Nodule removed from back     SHOULDER SURGERY Left 11/2013   TONSILLECTOMY     as teenager   Patient Active Problem List   Diagnosis Date Noted   Hyperlipidemia associated with type 2 diabetes mellitus (HCC) 01/25/2021   Type 2 diabetes mellitus with morbid obesity (HCC) 01/25/2021   Moderate persistent asthma, uncomplicated 11/04/2018   OSA (obstructive sleep apnea) 12/02/2016   Hyperlipidemia LDL goal <100 10/21/2014   Essential  hypertension, benign 10/21/2014   DM w/o complication type II (HCC) 09/13/2014   Routine general medical examination at a health care facility 10/12/2013   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 05/03/2013   Need for prophylactic vaccination and inoculation against influenza 02/08/2013   GERD (gastroesophageal reflux disease) 02/08/2013   Other and unspecified hyperlipidemia 11/10/2012   Type II or unspecified type diabetes mellitus without mention of complication, not stated as uncontrolled 10/25/2012   Impaired fasting glucose 09/07/2012   Morbid obesity (HCC) 09/07/2012   Generalized osteoarthrosis, involving multiple sites 09/07/2012   Carpal tunnel syndrome 09/07/2012   Other, multiple, and unspecified sites, insect bite, nonvenomous, without mention of infection(919.4) 09/07/2012   Special screening for malignant neoplasms, colon 09/07/2012   Other specified cardiac dysrhythmias(427.89) 09/07/2012   Candidiasis of vulva and vagina 09/07/2012   Abdominal pain, generalized 09/07/2012   Symptomatic menopausal or female climacteric states 09/07/2012   Hypertension associated with diabetes (HCC) 09/07/2012   Perennial allergic rhinitis 09/07/2012   Pain in joint, site unspecified 09/07/2012   Other malaise and fatigue 09/07/2012    PCP: Marcine Matar, MD  REFERRING PROVIDER: Marcine Matar, MD  REFERRING DIAG: Sciatica of left side  associated with disorder of lumbar spine [M53.86]   Rationale for Evaluation and Treatment: Rehabilitation  THERAPY DIAG:  Radiculopathy, lumbar region  ONSET DATE: 1+ year   SUBJECTIVE:                                                                                                                                                                                           SUBJECTIVE STATEMENT: 01/28/2023 woke up with more pain today, unsure of provocative factor although she notes she's been doing more housework. Notes she felt good  after last session, has been doing HEP without issues. No pain while sitting, about 4/10 while standing/walking today. Also notes that her peripheral symptoms seem to be centralizing to above knee.     PERTINENT HISTORY:  PMHx includes: Arthritis, Asthma, DM type II, HTN, Obesity, OSA, syncope, BIL TKR, essential HTN  PAIN:  Are you having pain? See subjective above Pain location: Lt LE to the top of Lt foot  Pain description: burning, aching, "pain"  Aggravating factors: standing (>15-20 min), walking, stairs, heavy lifting/carrying  Relieving factors: injection, heating pad   PRECAUTIONS: None  RED FLAGS: None   WEIGHT BEARING RESTRICTIONS: No  FALLS:  Has patient fallen in last 6 months? No  LIVING ENVIRONMENT: Lives with: lives with their family and lives alone Lives in: House/apartment Stairs: No Has following equipment at home: Single point cane, Environmental consultant - 2 wheeled, shower chair, and Grab bars  OCCUPATION: n/a   PLOF: Independent  PATIENT GOALS: "I hope that I can get out in the park to walk, and to keep up with my 3 y.o. grandbaby."   NEXT MD VISIT: 02/03/23 PCP, 01/21/23    OBJECTIVE: (objective measures completed at initial evaluation unless otherwise dated)   DIAGNOSTIC FINDINGS:  Had MRI lumbar spine 05/05/22 that showed:   IMPRESSION: 1. Mild multilevel degenerative changes of the lumbar spine, slightly progressed at the L4-5 level where there is mild-to-moderate right and mild left foraminal stenosis. 2. No significant canal stenosis at any level.  PATIENT SURVEYS:  FOTO 38 current, 53 predicted   COGNITION: Overall cognitive status: Within functional limits for tasks assessed     SENSATION: Not tested  PALPATION: Moderate pain with CPA from L3-S1, and moderate tenderness to palpation along Lt QL and lumbar paraspinals  LUMBAR ROM:   AROM eval  Flexion 80% Reported P! when returning to standing   Extension 50% , increased p!  Right  lateral flexion 100%  Left lateral flexion 100%  Right rotation   Left rotation    (Blank rows = not tested)   LOWER EXTREMITY MMT:  MMT Right eval Left eval  Hip flexion 4+ 4  Hip extension    Hip abduction 4+ 4+  Hip adduction 5 5  Hip internal rotation    Hip external rotation    Knee flexion    Knee extension 5 5  Ankle dorsiflexion    Ankle plantarflexion    Ankle inversion    Ankle eversion      GAIT: Distance walked: 100 ft Assistive device utilized: None Level of assistance: Modified independence Comments: SPC, ambulates slowly with wide BOS, short step length     TODAY'S TREATMENT:    [facility] Adult PT Treatment:                                                DATE: 01/28/23 Therapeutic Exercise: Nu step warm up 5 min LE/UE Adductor iso w/ ball, seated 2x10 cues for posture and breath control Seated hip abduction red band x10 cues for posture Standing hip abduction red band x10 cues for pacing Standing hip ext red band x10 cues for pacing Seated paloff press red band 2x10 BIL cues for form, posture HEP update + education    [facility] Adult PT Treatment:                                                DATE: 01/22/23 Therapeutic Exercise: Nu step 5 min LE/UE  Standing hip abduction x10 BIL, x8 with red band Standing hip extension x10 BIL, x8 with red band  STS from raised mat 2x8 Bridge x10 cues for breath control LTR x10 BIL cues for comfortable ROM and breath control  Swiss ball rollout (fwd only) x10 (limited ROM d/t LUE, nonpainful) Verbal HEP review/discussion at end of session    [facility] Adult PT Treatment:                                                DATE: 01/20/23 Therapeutic Exercise: Nustep level 5 x 5 mins while gathering subjective Standing hip abduction/extension x10 ea BIL Step ups 4" x10 BIL fwd/lat Seated hamstring stretch 2x30" BIL Seated pball roll outs fwd/lat x10 ea (limited by Lt shoulder ROM) Supine PPT 3" hold 2x10 Bridges with ball  2x10 LTR x10 BIL STS from elevated table x5 - pain in Lt hip/lateral thigh                                 [facility] Adult PT Treatment:                                                DATE: 01/14/2023   Initial evaluation: see patient education and home exercise program as noted below   PATIENT EDUCATION:  Education details: rationale for interventions, HEP  Person educated: Patient Education method: Explanation, Demonstration, and Handouts Education comprehension: verbalized understanding, returned demonstration, and needs further education  HOME EXERCISE PROGRAM: Access Code: [email or web address] URL:  [email or web address] Date: 01/28/2023 Prepared by: Fransisco Hertz  Exercises - Seated Lumbar Flexion Stretch  - 2 x daily - 7 x weekly - 2 sets - 10 reps - 3 sec hold - Seated Pelvic Tilt  - 2 x daily - 7 x weekly - 2 sets - 10 reps - 3 sec hold - Seated Hip Adduction Isometrics with Ball  - 2-3 x daily - 7 x weekly - 1 sets - 10 reps - Seated Hip Abduction with Resistance  - 2-3 x daily - 7 x weekly - 1 sets - 10 reps  ASSESSMENT:  CLINICAL IMPRESSION: 01/28/2023 Pt arrives w/ 4/10 pain while standing/walking, notes increased activity yesterday, felt good after last session. Today working on progression with familiar exercises and addition of more direct resistance for core musculature which pt tolerates well despite increased pain on arrival. Cues as above. Reports some mild muscular fatigue throughout session with pain peaking at 5/10, departs with report of improved pain compared to arrival, no adverse events. Pt departs today's session in no acute distress, all voiced questions/concerns addressed appropriately from PT perspective.   Pt departs today's session in no acute distress, all voiced questions/concerns addressed appropriately from PT perspective.     OBJECTIVE IMPAIRMENTS: decreased activity tolerance, decreased endurance, decreased mobility, decreased strength, impaired  perceived functional ability, impaired UE functional use, improper body mechanics, postural dysfunction, and pain.   ACTIVITY LIMITATIONS: carrying, lifting, bending, standing, squatting, stairs, transfers, and hygiene/grooming  PARTICIPATION LIMITATIONS: meal prep, cleaning, laundry, shopping, community activity, and occupation  PERSONAL FACTORS: Past/current experiences, Time since onset of injury/illness/exacerbation, and 3+ comorbidities: PMHx includes: Arthritis, Asthma, DM type II, HTN, Obesity, OSA, syncope, BIL TKR, essential HTN  are also affecting patient's functional outcome.   REHAB POTENTIAL: Fair    CLINICAL DECISION MAKING: Evolving/moderate complexity  EVALUATION COMPLEXITY: Moderate   GOALS: Goals reviewed with patient? Yes  SHORT TERM GOALS: Target date: 02/11/2023   Patient will be independent with initial home program for LS mobility and LQ strengthening.  Baseline: provided at eval  Goal status: INITIAL    LONG TERM GOALS: Target date: 03/11/2023   Patient will demonstrate improved LS AROM to at least 75% extension.   Baseline:  Lumbar AROM eval  Flexion 80% Reported P! when returning to standing   Extension 50% , increased p!  Right lateral flexion 100%  Left lateral flexion 100%   Goal status: INITIAL  2.  Patient will demonstrate increased LE MMT to 5/5 bilaterally.  Baseline:  LE MMT Right eval Left eval  Hip flexion 4+ 4  Hip abduction 4+ 4+  Hip adduction 5 5  Knee extension 5 5   Goal status: INITIAL  3.  Patient will report improved overall functional ability with FOTO score of 53 or greater.   Baseline: 38 current Goal status: INITIAL  4.  Patient will report improved standing and walking tolerance to 30 minutes or greater for regular walking program and performance of household tasks.  Baseline: 15-20 minutes  Goal status: INITIAL  5.  Patient will demonstrate ability to ascend/descend stairs x 15 without exacerbation of  symptoms.  Baseline: moderate difficulty  Goal status: INITIAL  6.  Patient will demonstrate ability to perform floor to waist lifting of at least 20# using appropriate body mechanics and with no more than minimal pain in order to safely perform normal daily/occupational tasks.   Baseline: unable  Goal status: INITIAL  PLAN:  PT FREQUENCY: 2x/week  PT DURATION: 6 weeks  PLANNED INTERVENTIONS: Therapeutic exercises, Therapeutic activity, Neuromuscular re-education, Patient/Family education, Self Care, Joint mobilization, Aquatic Therapy, Dry Needling, Electrical stimulation, Cryotherapy, Moist heat, Taping, Traction, Manual therapy, and Re-evaluation.  PLAN FOR NEXT SESSION: repeated and sustained lumbar motions for pain centralization and relief (begin with flexion and/or sidebending), core strength & endurance, manual therapy as indicated, aerobic activities for pain modulation, nerve glides, lumbar and LE mobility     Ashley Murrain PT, DPT 01/28/2023 1:57 PM

## 2023-01-28 ENCOUNTER — Ambulatory Visit (INDEPENDENT_AMBULATORY_CARE_PROVIDER_SITE_OTHER): Payer: Medicare Other | Admitting: *Deleted

## 2023-01-28 ENCOUNTER — Ambulatory Visit: Payer: Medicare Other | Attending: Internal Medicine | Admitting: Physical Therapy

## 2023-01-28 ENCOUNTER — Encounter: Payer: Self-pay | Admitting: Physical Therapy

## 2023-01-28 DIAGNOSIS — J309 Allergic rhinitis, unspecified: Secondary | ICD-10-CM | POA: Diagnosis not present

## 2023-01-28 DIAGNOSIS — M5416 Radiculopathy, lumbar region: Secondary | ICD-10-CM | POA: Diagnosis not present

## 2023-01-29 NOTE — Therapy (Signed)
OUTPATIENT PHYSICAL THERAPY TREATMENT NOTE   Patient Name: Cheryl [name] MRN: [id number] DOB:Dec 20, 1955, 67 y.o., female Today's Date: 01/30/2023  END OF SESSION:  PT End of Session - 01/30/23 1058     Visit Number 5    Number of Visits 13    Date for PT Re-Evaluation 03/11/23    Authorization Type MCR    Progress Note Due on Visit 10    PT Start Time 1059    PT Stop Time 1142    PT Time Calculation (min) 43 min    Activity Tolerance Patient tolerated treatment well;No increased pain    Behavior During Therapy WFL for tasks assessed/performed                 Past Medical History:  Diagnosis Date   Allergic rhinitis, cause unspecified    Arthritis    Asthma    Carpal tunnel syndrome    Cataract    Diabetes mellitus without complication (HCC)    diet- controlled   GERD (gastroesophageal reflux disease)    Glaucoma    Hypertension    Impaired fasting glucose    Morbid obesity (HCC)    OSA (obstructive sleep apnea) 12/02/2016   Other malaise and fatigue    Other specified cardiac dysrhythmias(427.89)    Pain in joint, site unspecified    Symptomatic menopausal or female climacteric states    Syncope and collapse    Past Surgical History:  Procedure Laterality Date   ABDOMINAL HYSTERECTOMY     1994   carpal tunnel both hands     2012   CLOSED MANIPULATION SHOULDER Left 04/2014   EYE SURGERY Left 08/25/2015   EYE SURGERY Right 09/24/2015   JOINT REPLACEMENT     both knees replacement, 2010   mole removed from face     Nodule removed from back     SHOULDER SURGERY Left 11/2013   TONSILLECTOMY     as teenager   Patient Active Problem List   Diagnosis Date Noted   Hyperlipidemia associated with type 2 diabetes mellitus (HCC) 01/25/2021   Type 2 diabetes mellitus with morbid obesity (HCC) 01/25/2021   Moderate persistent asthma, uncomplicated 11/04/2018   OSA (obstructive sleep apnea) 12/02/2016   Hyperlipidemia LDL goal <100 10/21/2014   Essential  hypertension, benign 10/21/2014   DM w/o complication type II (HCC) 09/13/2014   Routine general medical examination at a health care facility 10/12/2013   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 05/03/2013   Need for prophylactic vaccination and inoculation against influenza 02/08/2013   GERD (gastroesophageal reflux disease) 02/08/2013   Other and unspecified hyperlipidemia 11/10/2012   Type II or unspecified type diabetes mellitus without mention of complication, not stated as uncontrolled 10/25/2012   Impaired fasting glucose 09/07/2012   Morbid obesity (HCC) 09/07/2012   Generalized osteoarthrosis, involving multiple sites 09/07/2012   Carpal tunnel syndrome 09/07/2012   Other, multiple, and unspecified sites, insect bite, nonvenomous, without mention of infection(919.4) 09/07/2012   Special screening for malignant neoplasms, colon 09/07/2012   Other specified cardiac dysrhythmias(427.89) 09/07/2012   Candidiasis of vulva and vagina 09/07/2012   Abdominal pain, generalized 09/07/2012   Symptomatic menopausal or female climacteric states 09/07/2012   Hypertension associated with diabetes (HCC) 09/07/2012   Perennial allergic rhinitis 09/07/2012   Pain in joint, site unspecified 09/07/2012   Other malaise and fatigue 09/07/2012    PCP: Marcine Matar, MD  REFERRING PROVIDER: Marcine Matar, MD  REFERRING DIAG: Sciatica of left  side associated with disorder of lumbar spine [M53.86]   Rationale for Evaluation and Treatment: Rehabilitation  THERAPY DIAG:  Radiculopathy, lumbar region  ONSET DATE: 1+ year   SUBJECTIVE:                                                                                                                                                                                           SUBJECTIVE STATEMENT: 01/30/2023 Pt states she was sore for a couple hours after last session but overall doing better. Still having LE into knee when walking more.  2/10 pain today. HEP going well, doing it in the mornings.      PERTINENT HISTORY:  PMHx includes: Arthritis, Asthma, DM type II, HTN, Obesity, OSA, syncope, BIL TKR, essential HTN  PAIN:  Are you having pain? See subjective above Pain location: Lt LE to the top of Lt foot  Pain description: burning, aching, "pain"  Aggravating factors: standing (>15-20 min), walking, stairs, heavy lifting/carrying  Relieving factors: injection, heating pad   PRECAUTIONS: None  RED FLAGS: None   WEIGHT BEARING RESTRICTIONS: No  FALLS:  Has patient fallen in last 6 months? No  LIVING ENVIRONMENT: Lives with: lives with their family and lives alone Lives in: House/apartment Stairs: No Has following equipment at home: Single point cane, Environmental consultant - 2 wheeled, shower chair, and Grab bars  OCCUPATION: n/a   PLOF: Independent  PATIENT GOALS: "I hope that I can get out in the park to walk, and to keep up with my 3 y.o. grandbaby."   NEXT MD VISIT: 02/03/23 PCP, 01/21/23    OBJECTIVE: (objective measures completed at initial evaluation unless otherwise dated)   DIAGNOSTIC FINDINGS:  Had MRI lumbar spine 05/05/22 that showed:   IMPRESSION: 1. Mild multilevel degenerative changes of the lumbar spine, slightly progressed at the L4-5 level where there is mild-to-moderate right and mild left foraminal stenosis. 2. No significant canal stenosis at any level.  PATIENT SURVEYS:  FOTO 38 current, 53 predicted   COGNITION: Overall cognitive status: Within functional limits for tasks assessed     SENSATION: Not tested  PALPATION: Moderate pain with CPA from L3-S1, and moderate tenderness to palpation along Lt QL and lumbar paraspinals  LUMBAR ROM:   AROM eval  Flexion 80% Reported P! when returning to standing   Extension 50% , increased p!  Right lateral flexion 100%  Left lateral flexion 100%  Right rotation   Left rotation    (Blank rows = not tested)   LOWER EXTREMITY MMT:     MMT Right eval Left eval  Hip flexion 4+ 4  Hip extension  Hip abduction 4+ 4+  Hip adduction 5 5  Hip internal rotation    Hip external rotation    Knee flexion    Knee extension 5 5  Ankle dorsiflexion    Ankle plantarflexion    Ankle inversion    Ankle eversion      GAIT: Distance walked: 100 ft Assistive device utilized: None Level of assistance: Modified independence Comments: SPC, ambulates slowly with wide BOS, short step length     TODAY'S TREATMENT:    [facility] Adult PT Treatment:                                                DATE: 01/30/23 Therapeutic Exercise: Nu step 5 min warmup LE/UE L6 Standing marches 2x5 BIL cues for comfortable ROM, breath control Standing hip 45 deg kickback x10 BIL cues for reduced circumduction compensations Standing paloff press red band 2x10 BIL cues for posture Red band row 2x15 cues for posture and scap retraction Red band shoulder ext x15 cues for posture and reduced elbow compensations STS lowest mat x5 cues for pacing LTR x10 BIL cues for comfortable ROM and breath control   [facility] Adult PT Treatment:                                                DATE: 01/28/23 Therapeutic Exercise: Nu step warm up 5 min LE/UE Adductor iso w/ ball, seated 2x10 cues for posture and breath control Seated hip abduction red band x10 cues for posture Standing hip abduction red band x10 cues for pacing Standing hip ext red band x10 cues for pacing Seated paloff press red band 2x10 BIL cues for form, posture HEP update + education    [facility] Adult PT Treatment:                                                DATE: 01/22/23 Therapeutic Exercise: Nu step 5 min LE/UE  Standing hip abduction x10 BIL, x8 with red band Standing hip extension x10 BIL, x8 with red band  STS from raised mat 2x8 Bridge x10 cues for breath control LTR x10 BIL cues for comfortable ROM and breath control  Swiss ball rollout (fwd only) x10 (limited ROM d/t LUE,  nonpainful) Verbal HEP review/discussion at end of session    [facility] Adult PT Treatment:                                                DATE: 01/20/23 Therapeutic Exercise: Nustep level 5 x 5 mins while gathering subjective Standing hip abduction/extension x10 ea BIL Step ups 4" x10 BIL fwd/lat Seated hamstring stretch 2x30" BIL Seated pball roll outs fwd/lat x10 ea (limited by Lt shoulder ROM) Supine PPT 3" hold 2x10 Bridges with ball 2x10 LTR x10 BIL STS from elevated table x5 - pain in Lt hip/lateral thigh  [facility] Adult PT Treatment:                                                DATE: 01/14/2023   Initial evaluation: see patient education and home exercise program as noted below   PATIENT EDUCATION:  Education details: rationale for interventions, HEP  Person educated: Patient Education method: Explanation, Demonstration, and Handouts Education comprehension: verbalized understanding, returned demonstration, and needs further education  HOME EXERCISE PROGRAM: Access Code: [email or web address] URL: [email or web address] Date: 01/28/2023 Prepared by: Fransisco Hertz  Exercises - Seated Lumbar Flexion Stretch  - 2 x daily - 7 x weekly - 2 sets - 10 reps - 3 sec hold - Seated Pelvic Tilt  - 2 x daily - 7 x weekly - 2 sets - 10 reps - 3 sec hold - Seated Hip Adduction Isometrics with Ball  - 2-3 x daily - 7 x weekly - 1 sets - 10 reps - Seated Hip Abduction with Resistance  - 2-3 x daily - 7 x weekly - 1 sets - 10 reps  ASSESSMENT:  CLINICAL IMPRESSION: 01/30/2023 Pt arrives w/ 2/10 pain, continues to endorse slow/steady improvement and good HEP adherence, mild transient soreness after last session as expected. Given continued improvement in tolerance, today we are able to progress into more WB activities and functional movement patterns which pt does well with overall, transient increases in pain with more time in standing which is mitigated with  seated rest. No adverse events, reports 3/10 pain on departure while standing/walking (no resting pain). Recommend continuing along current POC in order to address relevant deficits and improve functional tolerance. Pt departs today's session in no acute distress, all voiced questions/concerns addressed appropriately from PT perspective.      OBJECTIVE IMPAIRMENTS: decreased activity tolerance, decreased endurance, decreased mobility, decreased strength, impaired perceived functional ability, impaired UE functional use, improper body mechanics, postural dysfunction, and pain.   ACTIVITY LIMITATIONS: carrying, lifting, bending, standing, squatting, stairs, transfers, and hygiene/grooming  PARTICIPATION LIMITATIONS: meal prep, cleaning, laundry, shopping, community activity, and occupation  PERSONAL FACTORS: Past/current experiences, Time since onset of injury/illness/exacerbation, and 3+ comorbidities: PMHx includes: Arthritis, Asthma, DM type II, HTN, Obesity, OSA, syncope, BIL TKR, essential HTN  are also affecting patient's functional outcome.   REHAB POTENTIAL: Fair    CLINICAL DECISION MAKING: Evolving/moderate complexity  EVALUATION COMPLEXITY: Moderate   GOALS: Goals reviewed with patient? Yes  SHORT TERM GOALS: Target date: 02/11/2023   Patient will be independent with initial home program for LS mobility and LQ strengthening.  Baseline: provided at eval  Goal status: INITIAL    LONG TERM GOALS: Target date: 03/11/2023   Patient will demonstrate improved LS AROM to at least 75% extension.   Baseline:  Lumbar AROM eval  Flexion 80% Reported P! when returning to standing   Extension 50% , increased p!  Right lateral flexion 100%  Left lateral flexion 100%   Goal status: INITIAL  2.  Patient will demonstrate increased LE MMT to 5/5 bilaterally.  Baseline:  LE MMT Right eval Left eval  Hip flexion 4+ 4  Hip abduction 4+ 4+  Hip adduction 5 5  Knee extension 5 5    Goal status: INITIAL  3.  Patient will report improved overall functional ability with FOTO score of 53 or greater.   Baseline: 38 current Goal status: INITIAL  4.  Patient will report improved standing and walking tolerance to 30 minutes or greater for regular walking program and performance of household tasks.  Baseline: 15-20 minutes  Goal status: INITIAL  5.  Patient will demonstrate ability to ascend/descend stairs x 15 without exacerbation of symptoms.  Baseline: moderate difficulty  Goal status: INITIAL  6.  Patient will demonstrate ability to perform floor to waist lifting of at least 20# using appropriate body mechanics and with no more than minimal pain in order to safely perform normal daily/occupational tasks.   Baseline: unable  Goal status: INITIAL  PLAN:  PT FREQUENCY: 2x/week  PT DURATION: 6 weeks  PLANNED INTERVENTIONS: Therapeutic exercises, Therapeutic activity, Neuromuscular re-education, Patient/Family education, Self Care, Joint mobilization, Aquatic Therapy, Dry Needling, Electrical stimulation, Cryotherapy, Moist heat, Taping, Traction, Manual therapy, and Re-evaluation.  PLAN FOR NEXT SESSION: repeated and sustained lumbar motions for pain centralization and relief (begin with flexion and/or sidebending), core strength & endurance, manual therapy as indicated, aerobic activities for pain modulation, nerve glides, lumbar and LE mobility. FOTO next session   Ashley Murrain PT, DPT 01/30/2023 12:32 PM

## 2023-01-30 ENCOUNTER — Ambulatory Visit: Payer: Medicare Other | Admitting: Physical Therapy

## 2023-01-30 ENCOUNTER — Encounter: Payer: Self-pay | Admitting: Physical Therapy

## 2023-01-30 DIAGNOSIS — M5416 Radiculopathy, lumbar region: Secondary | ICD-10-CM | POA: Diagnosis not present

## 2023-02-02 ENCOUNTER — Ambulatory Visit: Payer: Medicare Other | Admitting: Physical Therapy

## 2023-02-02 ENCOUNTER — Encounter: Payer: Self-pay | Admitting: Physical Therapy

## 2023-02-02 ENCOUNTER — Ambulatory Visit (INDEPENDENT_AMBULATORY_CARE_PROVIDER_SITE_OTHER): Payer: Self-pay

## 2023-02-02 DIAGNOSIS — M5416 Radiculopathy, lumbar region: Secondary | ICD-10-CM

## 2023-02-02 DIAGNOSIS — J309 Allergic rhinitis, unspecified: Secondary | ICD-10-CM | POA: Diagnosis not present

## 2023-02-02 NOTE — Therapy (Signed)
OUTPATIENT PHYSICAL THERAPY TREATMENT NOTE   Patient Name: Cheryl [name] MRN: [id number] DOB:1956-05-12, 67 y.o., female Today's Date: 02/02/2023  END OF SESSION:  PT End of Session - 02/02/23 1401     Visit Number 6    Number of Visits 13    Date for PT Re-Evaluation 03/11/23    Authorization Type MCR    Progress Note Due on Visit 10    PT Start Time 1401    PT Stop Time 1443    PT Time Calculation (min) 42 min    Activity Tolerance Patient tolerated treatment well;No increased pain    Behavior During Therapy WFL for tasks assessed/performed                  Past Medical History:  Diagnosis Date   Allergic rhinitis, cause unspecified    Arthritis    Asthma    Carpal tunnel syndrome    Cataract    Diabetes mellitus without complication (HCC)    diet- controlled   GERD (gastroesophageal reflux disease)    Glaucoma    Hypertension    Impaired fasting glucose    Morbid obesity (HCC)    OSA (obstructive sleep apnea) 12/02/2016   Other malaise and fatigue    Other specified cardiac dysrhythmias(427.89)    Pain in joint, site unspecified    Symptomatic menopausal or female climacteric states    Syncope and collapse    Past Surgical History:  Procedure Laterality Date   ABDOMINAL HYSTERECTOMY     1994   carpal tunnel both hands     2012   CLOSED MANIPULATION SHOULDER Left 04/2014   EYE SURGERY Left 08/25/2015   EYE SURGERY Right 09/24/2015   JOINT REPLACEMENT     both knees replacement, 2010   mole removed from face     Nodule removed from back     SHOULDER SURGERY Left 11/2013   TONSILLECTOMY     as teenager   Patient Active Problem List   Diagnosis Date Noted   Hyperlipidemia associated with type 2 diabetes mellitus (HCC) 01/25/2021   Type 2 diabetes mellitus with morbid obesity (HCC) 01/25/2021   Moderate persistent asthma, uncomplicated 11/04/2018   OSA (obstructive sleep apnea) 12/02/2016   Hyperlipidemia LDL goal <100 10/21/2014   Essential  hypertension, benign 10/21/2014   DM w/o complication type II (HCC) 09/13/2014   Routine general medical examination at a health care facility 10/12/2013   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 05/03/2013   Need for prophylactic vaccination and inoculation against influenza 02/08/2013   GERD (gastroesophageal reflux disease) 02/08/2013   Other and unspecified hyperlipidemia 11/10/2012   Type II or unspecified type diabetes mellitus without mention of complication, not stated as uncontrolled 10/25/2012   Impaired fasting glucose 09/07/2012   Morbid obesity (HCC) 09/07/2012   Generalized osteoarthrosis, involving multiple sites 09/07/2012   Carpal tunnel syndrome 09/07/2012   Other, multiple, and unspecified sites, insect bite, nonvenomous, without mention of infection(919.4) 09/07/2012   Special screening for malignant neoplasms, colon 09/07/2012   Other specified cardiac dysrhythmias(427.89) 09/07/2012   Candidiasis of vulva and vagina 09/07/2012   Abdominal pain, generalized 09/07/2012   Symptomatic menopausal or female climacteric states 09/07/2012   Hypertension associated with diabetes (HCC) 09/07/2012   Perennial allergic rhinitis 09/07/2012   Pain in joint, site unspecified 09/07/2012   Other malaise and fatigue 09/07/2012    PCP: Marcine Matar, MD  REFERRING PROVIDER: Marcine Matar, MD  REFERRING DIAG: Sciatica of  left side associated with disorder of lumbar spine [M53.86]   Rationale for Evaluation and Treatment: Rehabilitation  THERAPY DIAG:  Radiculopathy, lumbar region  ONSET DATE: 1+ year   SUBJECTIVE:                                                                                                                                                                                           SUBJECTIVE STATEMENT: 02/02/2023 Pt arrives w/o AD, states she was pretty sore after last session but cleared up by about 9:30 that evening, also went on a walk  around 5pm that was about 5000 steps. Felt back to baseline by next day, did a lot of walking on Saturday but used cane. 0/10 resting pain in sitting or standing pain on arrival. " I feel so much better than when I first started here".     PERTINENT HISTORY:  PMHx includes: Arthritis, Asthma, DM type II, HTN, Obesity, OSA, syncope, BIL TKR, essential HTN  PAIN:  Are you having pain? See subjective above Pain location: Lt LE to the top of Lt foot  Pain description: burning, aching, "pain"  Aggravating factors: standing (>15-20 min), walking, stairs, heavy lifting/carrying  Relieving factors: injection, heating pad   PRECAUTIONS: None  RED FLAGS: None   WEIGHT BEARING RESTRICTIONS: No  FALLS:  Has patient fallen in last 6 months? No  LIVING ENVIRONMENT: Lives with: lives with their family and lives alone Lives in: House/apartment Stairs: No Has following equipment at home: Single point cane, Environmental consultant - 2 wheeled, shower chair, and Grab bars  OCCUPATION: n/a   PLOF: Independent  PATIENT GOALS: "I hope that I can get out in the park to walk, and to keep up with my 3 y.o. grandbaby."   NEXT MD VISIT: 02/03/23 PCP, 01/21/23    OBJECTIVE: (objective measures completed at initial evaluation unless otherwise dated)   DIAGNOSTIC FINDINGS:  Had MRI lumbar spine 05/05/22 that showed:   IMPRESSION: 1. Mild multilevel degenerative changes of the lumbar spine, slightly progressed at the L4-5 level where there is mild-to-moderate right and mild left foraminal stenosis. 2. No significant canal stenosis at any level.  PATIENT SURVEYS:  FOTO 38 current, 53 predicted  02/02/23 FOTO: 40  COGNITION: Overall cognitive status: Within functional limits for tasks assessed     SENSATION: Not tested  PALPATION: Moderate pain with CPA from L3-S1, and moderate tenderness to palpation along Lt QL and lumbar paraspinals  LUMBAR ROM:   AROM eval  Flexion 80% Reported P! when returning to  standing   Extension 50% , increased p!  Right lateral flexion 100%  Left lateral flexion 100%  Right rotation   Left rotation    (Blank rows = not tested)   LOWER EXTREMITY MMT:    MMT Right eval Left eval  Hip flexion 4+ 4  Hip extension    Hip abduction 4+ 4+  Hip adduction 5 5  Hip internal rotation    Hip external rotation    Knee flexion    Knee extension 5 5  Ankle dorsiflexion    Ankle plantarflexion    Ankle inversion    Ankle eversion      GAIT: Distance walked: 100 ft Assistive device utilized: None Level of assistance: Modified independence Comments: SPC, ambulates slowly with wide BOS, short step length     TODAY'S TREATMENT:    [facility] Adult PT Treatment:                                                DATE: 02/02/23 Therapeutic Exercise: Suitcase carry 5# 3x38ft BIL cues for posture and pacing STS 2x5 from lowest mat cues for pacing and breath control, no UE support required Paloff press red band, standing x10 BIL cues for posture Paloff press + OH press (stopping <90 deg due to shoulder hx) red band x8 BIL cues for comfortable ROM and posture Red band hip 45 deg kickback x10 BIL cues for posture and reduced circumduction HEP update + handout/education, continued education/discussion re: pain response with activity, monitoring/modifying activity as appropriate, and walking outside of sessions     [facility] Adult PT Treatment:                                                DATE: 01/30/23 Therapeutic Exercise: Nu step 5 min warmup LE/UE L6 Standing marches 2x5 BIL cues for comfortable ROM, breath control Standing hip 45 deg kickback x10 BIL cues for reduced circumduction compensations Standing paloff press red band 2x10 BIL cues for posture Red band row 2x15 cues for posture and scap retraction Red band shoulder ext x15 cues for posture and reduced elbow compensations STS lowest mat x5 cues for pacing LTR x10 BIL cues for comfortable ROM and breath  control   [facility] Adult PT Treatment:                                                DATE: 01/28/23 Therapeutic Exercise: Nu step warm up 5 min LE/UE Adductor iso w/ ball, seated 2x10 cues for posture and breath control Seated hip abduction red band x10 cues for posture Standing hip abduction red band x10 cues for pacing Standing hip ext red band x10 cues for pacing Seated paloff press red band 2x10 BIL cues for form, posture HEP update + education    [facility] Adult PT Treatment:                                                DATE: 01/22/23 Therapeutic Exercise: Nu step 5 min LE/UE  Standing hip abduction x10 BIL, x8 with red band  Standing hip extension x10 BIL, x8 with red band  STS from raised mat 2x8 Bridge x10 cues for breath control LTR x10 BIL cues for comfortable ROM and breath control  Swiss ball rollout (fwd only) x10 (limited ROM d/t LUE, nonpainful) Verbal HEP review/discussion at end of session    [facility] Adult PT Treatment:                                                DATE: 01/20/23 Therapeutic Exercise: Nustep level 5 x 5 mins while gathering subjective Standing hip abduction/extension x10 ea BIL Step ups 4" x10 BIL fwd/lat Seated hamstring stretch 2x30" BIL Seated pball roll outs fwd/lat x10 ea (limited by Lt shoulder ROM) Supine PPT 3" hold 2x10 Bridges with ball 2x10 LTR x10 BIL STS from elevated table x5 - pain in Lt hip/lateral thigh                                 [facility] Adult PT Treatment:                                                DATE: 01/14/2023   Initial evaluation: see patient education and home exercise program as noted below   PATIENT EDUCATION:  Education details: rationale for interventions, HEP  Person educated: Patient Education method: Explanation, Demonstration, and Handouts Education comprehension: verbalized understanding, returned demonstration, and needs further education  HOME EXERCISE PROGRAM: Access Code: [email or web address] URL:  [email or web address] Date: 01/28/2023 Prepared by: Fransisco Hertz  Exercises - Seated Lumbar Flexion Stretch  - 2 x daily - 7 x weekly - 2 sets - 10 reps - 3 sec hold - Seated Pelvic Tilt  - 2 x daily - 7 x weekly - 2 sets - 10 reps - 3 sec hold - Seated Hip Adduction Isometrics with Ball  - 2-3 x daily - 7 x weekly - 1 sets - 10 reps - Seated Hip Abduction with Resistance  - 2-3 x daily - 7 x weekly - 1 sets - 10 reps  ASSESSMENT:  CLINICAL IMPRESSION: 02/02/2023 Pt arrives w/ report of no pain, does describe some soreness after last session that resolved by next day. Today we are able to continue progression with standing tolerance, emphasis on core/hip endurance. Also able to progress functional strengthening with addition of suitcase carries and increased volume with STS. Pt tolerates session well although she does require intermittent seated rest to mitigate fatigue, and we are modifying paloff + OH press to limited ROM due to her chronic L shoulder issues (this is mildly irritated during exercise, but non-worsening and resolves with cessation of paloff + OH press). No adverse events, pt continues to deny any pain on departure. Recommend continuing along current POC in order to address relevant deficits and improve functional tolerance. Pt departs today's session in no acute distress, all voiced questions/concerns addressed appropriately from PT perspective.      OBJECTIVE IMPAIRMENTS: decreased activity tolerance, decreased endurance, decreased mobility, decreased strength, impaired perceived functional ability, impaired UE functional use, improper body mechanics, postural dysfunction, and pain.   ACTIVITY LIMITATIONS: carrying, lifting, bending, standing, squatting, stairs, transfers, and hygiene/grooming  PARTICIPATION LIMITATIONS:  meal prep, cleaning, laundry, shopping, community activity, and occupation  PERSONAL FACTORS: Past/current experiences, Time since onset of  injury/illness/exacerbation, and 3+ comorbidities: PMHx includes: Arthritis, Asthma, DM type II, HTN, Obesity, OSA, syncope, BIL TKR, essential HTN  are also affecting patient's functional outcome.   REHAB POTENTIAL: Fair    CLINICAL DECISION MAKING: Evolving/moderate complexity  EVALUATION COMPLEXITY: Moderate   GOALS: Goals reviewed with patient? Yes  SHORT TERM GOALS: Target date: 02/11/2023   Patient will be independent with initial home program for LS mobility and LQ strengthening.  Baseline: provided at eval  Goal status: INITIAL    LONG TERM GOALS: Target date: 03/11/2023   Patient will demonstrate improved LS AROM to at least 75% extension.   Baseline:  Lumbar AROM eval  Flexion 80% Reported P! when returning to standing   Extension 50% , increased p!  Right lateral flexion 100%  Left lateral flexion 100%   Goal status: INITIAL  2.  Patient will demonstrate increased LE MMT to 5/5 bilaterally.  Baseline:  LE MMT Right eval Left eval  Hip flexion 4+ 4  Hip abduction 4+ 4+  Hip adduction 5 5  Knee extension 5 5   Goal status: INITIAL  3.  Patient will report improved overall functional ability with FOTO score of 53 or greater.   Baseline: 38 current Goal status: INITIAL  4.  Patient will report improved standing and walking tolerance to 30 minutes or greater for regular walking program and performance of household tasks.  Baseline: 15-20 minutes  Goal status: INITIAL  5.  Patient will demonstrate ability to ascend/descend stairs x 15 without exacerbation of symptoms.  Baseline: moderate difficulty  Goal status: INITIAL  6.  Patient will demonstrate ability to perform floor to waist lifting of at least 20# using appropriate body mechanics and with no more than minimal pain in order to safely perform normal daily/occupational tasks.   Baseline: unable  Goal status: INITIAL  PLAN:  PT FREQUENCY: 2x/week  PT DURATION: 6 weeks  PLANNED  INTERVENTIONS: Therapeutic exercises, Therapeutic activity, Neuromuscular re-education, Patient/Family education, Self Care, Joint mobilization, Aquatic Therapy, Dry Needling, Electrical stimulation, Cryotherapy, Moist heat, Taping, Traction, Manual therapy, and Re-evaluation.  PLAN FOR NEXT SESSION: continue to progress functional strengthening within pt tolerance and time spent in WB with reduced rest breaks as appropriate. May also be beneficial to incorporate nerve glides or other centralization strategies    Ashley Murrain PT, DPT 02/02/2023 5:15 PM

## 2023-02-03 ENCOUNTER — Encounter: Payer: Self-pay | Admitting: Internal Medicine

## 2023-02-03 ENCOUNTER — Other Ambulatory Visit: Payer: Self-pay | Admitting: Internal Medicine

## 2023-02-03 ENCOUNTER — Ambulatory Visit: Payer: Medicare Other | Attending: Internal Medicine | Admitting: Internal Medicine

## 2023-02-03 ENCOUNTER — Other Ambulatory Visit: Payer: Self-pay | Admitting: Allergy & Immunology

## 2023-02-03 DIAGNOSIS — Z7984 Long term (current) use of oral hypoglycemic drugs: Secondary | ICD-10-CM

## 2023-02-03 DIAGNOSIS — E1169 Type 2 diabetes mellitus with other specified complication: Secondary | ICD-10-CM | POA: Insufficient documentation

## 2023-02-03 DIAGNOSIS — M5432 Sciatica, left side: Secondary | ICD-10-CM | POA: Insufficient documentation

## 2023-02-03 DIAGNOSIS — M5386 Other specified dorsopathies, lumbar region: Secondary | ICD-10-CM | POA: Diagnosis not present

## 2023-02-03 DIAGNOSIS — Z79899 Other long term (current) drug therapy: Secondary | ICD-10-CM | POA: Insufficient documentation

## 2023-02-03 DIAGNOSIS — E119 Type 2 diabetes mellitus without complications: Secondary | ICD-10-CM | POA: Diagnosis present

## 2023-02-03 DIAGNOSIS — E1159 Type 2 diabetes mellitus with other circulatory complications: Secondary | ICD-10-CM

## 2023-02-03 DIAGNOSIS — E785 Hyperlipidemia, unspecified: Secondary | ICD-10-CM | POA: Diagnosis not present

## 2023-02-03 DIAGNOSIS — I152 Hypertension secondary to endocrine disorders: Secondary | ICD-10-CM

## 2023-02-03 DIAGNOSIS — Z6841 Body Mass Index (BMI) 40.0 and over, adult: Secondary | ICD-10-CM | POA: Insufficient documentation

## 2023-02-03 DIAGNOSIS — J4530 Mild persistent asthma, uncomplicated: Secondary | ICD-10-CM | POA: Insufficient documentation

## 2023-02-03 DIAGNOSIS — I10 Essential (primary) hypertension: Secondary | ICD-10-CM | POA: Diagnosis not present

## 2023-02-03 LAB — GLUCOSE, POCT (MANUAL RESULT ENTRY): POC Glucose: 117 mg/dL — AB (ref 70–99)

## 2023-02-03 LAB — POCT GLYCOSYLATED HEMOGLOBIN (HGB A1C): HbA1c, POC (controlled diabetic range): 6.1 % (ref 0.0–7.0)

## 2023-02-03 NOTE — Progress Notes (Signed)
Patient ID: Cheryl [name], female    DOB: 1956/05/10  MRN: [id number]  CC: Diabetes (DM f/u./Discuss gabapentin - reports that helps/Already received flu vax/)   Subjective: Cheryl [name] is a 67 y.o. female who presents for chronic ds management. Her concerns today include:  Pt with hx of DM type 2, HTN, HL, moderate persistent asthma, OSA on CPAP, morbid obesity, arthritis/LBP, chronic pelvic pain syndrome/pelvic floor dysfunction followed by urology Dr. Robby Sermon   Last visit 01/01/2023:  c/o LT sided sciatica symptoms.  Given short course of Tramadol and started on Gabapentin 300 mg at bedtime.  Finds Gabapentin to be very helpful and would like to continue for now.  Advised to f/u with her orthopedics specialist at [facility]. She did see him.  Pt doing P.T 2x/wk.  Very helpful.  Started walking again.  No falls  DM/obesity: Results for orders placed or performed in visit on 02/03/23  POCT glycosylated hemoglobin (Hb A1C)  Result Value Ref Range   Hemoglobin A1C     HbA1c POC (<> result, manual entry)     HbA1c, POC (prediabetic range)     HbA1c, POC (controlled diabetic range) 6.1 0.0 - 7.0 %  POCT glucose (manual entry)  Result Value Ref Range   POC Glucose 117 (A) 70 - 99 mg/dl  W0J 6.1 Diet control. Still doing well with eating habits.  Eats heaviest meal for lunch and light for BF and dinner Down 5 lbs since last visit Started walking 3 days a wk [id number] steps which states her about 20 mins and also doing Pt 2x/wk  HYPERTENSION Currently taking: see medication list.  Patient on Maxide 75/50 mg and lisinopril 10 mg daily. Med Adherence: [x]  Yes    []  No Medication side effects: []  Yes    [x]  No Adherence with salt restriction: [x]  Yes -trying   []  No Home Monitoring?: [x]  Yes - uses wrist device. Reports good readings   HL:  : Taking and tolerating Zocor 20 mg daily.   HM:  had COVID booster and Flu vaccine at [facility] 01/23/2023 Patient Active Problem List    Diagnosis Date Noted   Hyperlipidemia associated with type 2 diabetes mellitus (HCC) 01/25/2021   Type 2 diabetes mellitus with morbid obesity (HCC) 01/25/2021   Moderate persistent asthma, uncomplicated 11/04/2018   OSA (obstructive sleep apnea) 12/02/2016   Hyperlipidemia LDL goal <100 10/21/2014   Essential hypertension, benign 10/21/2014   DM w/o complication type II (HCC) 09/13/2014   Routine general medical examination at a health care facility 10/12/2013   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 05/03/2013   Need for prophylactic vaccination and inoculation against influenza 02/08/2013   GERD (gastroesophageal reflux disease) 02/08/2013   Other and unspecified hyperlipidemia 11/10/2012   Type II or unspecified type diabetes mellitus without mention of complication, not stated as uncontrolled 10/25/2012   Impaired fasting glucose 09/07/2012   Morbid obesity (HCC) 09/07/2012   Generalized osteoarthrosis, involving multiple sites 09/07/2012   Carpal tunnel syndrome 09/07/2012   Other, multiple, and unspecified sites, insect bite, nonvenomous, without mention of infection(919.4) 09/07/2012   Special screening for malignant neoplasms, colon 09/07/2012   Other specified cardiac dysrhythmias(427.89) 09/07/2012   Candidiasis of vulva and vagina 09/07/2012   Abdominal pain, generalized 09/07/2012   Symptomatic menopausal or female climacteric states 09/07/2012   Hypertension associated with diabetes (HCC) 09/07/2012   Perennial allergic rhinitis 09/07/2012   Pain in joint, site unspecified 09/07/2012   Other malaise and  fatigue 09/07/2012     Current Outpatient Medications on File Prior to Visit  Medication Sig Dispense Refill   acetaminophen (TYLENOL) 500 MG tablet Take 500 mg by mouth daily.     albuterol (VENTOLIN HFA) 108 (90 Base) MCG/ACT inhaler INHALE 2 PUFFS INTO THE LUNGS UP TO EVERY 6HRS AS NEEDED FOR WHEEZING/SHORTNESS OF BREATH 18 each 1   AMBULATORY NON FORMULARY  MEDICATION Medication Name: Allergy Injection- every 4 weeks  "I take it once a week" 01/14/23     aspirin 81 MG tablet Take 81 mg by mouth daily.     azelastine (ASTELIN) 0.1 % nasal spray Place 2 sprays into both nostrils 2 (two) times daily. Use in each nostril as directed 30 mL 5   B-D ULTRA-FINE 33 LANCETS MISC Use to test blood sugar once daily. Dx: E11.9 100 each 6   budesonide-formoterol (SYMBICORT) 160-4.5 MCG/ACT inhaler TAKE 2 PUFFS BY MOUTH TWICE A DAY 30.6 each 5   Calcium Carbonate-Vitamin D 600-400 MG-UNIT chew tablet Chew 1 tablet by mouth daily.      EPINEPHRINE 0.3 mg/0.3 mL IJ SOAJ injection INJECT 0.3 MG INTO THE MUSCLE AS NEEDED FOR ANAPHYLAXIS. 2 each 1   fluticasone (FLONASE) 50 MCG/ACT nasal spray Place 1-2 sprays into both nostrils daily. 48 mL 2   lisinopril (ZESTRIL) 10 MG tablet Take 1 tablet (10 mg total) by mouth daily. 90 tablet 1   Multiple Vitamin (MULTIVITAMIN ADULT PO) 1 tablet     nabumetone (RELAFEN) 500 MG tablet Take 500 mg by mouth daily. PRN     omeprazole (PRILOSEC) 40 MG capsule TAKE 1 CAPSULE (40 MG TOTAL) BY MOUTH DAILY. 90 capsule 0   Respiratory Therapy Supplies (CARETOUCH 2 CPAP HOSE HANGER) MISC      simvastatin (ZOCOR) 20 MG tablet TAKE 1 TABLET (20 MG) BY ORAL ROUTE ONCE DAILY IN THE EVENING FOR 90 DAYS 90 tablet 3   Spacer/Aero-Holding Chambers DEVI 1 each by Does not apply route as needed. 1 each 0   vitamin C (ASCORBIC ACID) 500 MG tablet Take 500 mg by mouth every other day.     gabapentin (NEURONTIN) 300 MG capsule Take 1 capsule (300 mg total) by mouth at bedtime. (Patient not taking: Reported on 02/03/2023) 30 capsule 3   No current facility-administered medications on file prior to visit.    Allergies  Allergen Reactions   Misc. Sulfonamide Containing Compounds Diarrhea   Elemental Sulfur Diarrhea   Oxycodone Nausea Only and Other (See Comments)    Had stomach and headache as side effect from medicine    Social History    Socioeconomic History   Marital status: Divorced    Spouse name: Not on file   Number of children: Not on file   Years of education: Not on file   Highest education level: Not on file  Occupational History   Not on file  Tobacco Use   Smoking status: Never   Smokeless tobacco: Never  Vaping Use   Vaping status: Never Used  Substance and Sexual Activity   Alcohol use: Yes    Comment: Seldom- Wine    Drug use: No   Sexual activity: Not Currently    Comment: post office worker  Other Topics Concern   Not on file  Social History Narrative   Not on file   Social Determinants of Health   Financial Resource Strain: Low Risk  (11/13/2017)   Overall Financial Resource Strain (CARDIA)    Difficulty of Paying Living  Expenses: Not hard at all  Food Insecurity: No Food Insecurity (11/13/2017)   Hunger Vital Sign    Worried About Running Out of Food in the Last Year: Never true    Ran Out of Food in the Last Year: Never true  Transportation Needs: No Transportation Needs (11/13/2017)   PRAPARE - Administrator, Civil Service (Medical): No    Lack of Transportation (Non-Medical): No  Physical Activity: Low Risk  (05/02/2022)   Received from [facility]   PCARE Exercise SDOH    Exercise: Aerobic    PCare Exercise SDOH: Not on file    PCare Exercise SDOH: Not on file  Stress: Stress Concern Present (11/13/2017)   [employer] of Occupational Health - Occupational Stress Questionnaire    Feeling of Stress : To some extent  Social Connections: Moderately Isolated (11/13/2017)   Social Connection and Isolation Panel [NHANES]    Frequency of Communication with Friends and Family: More than three times a week    Frequency of Social Gatherings with Friends and Family: Never    Attends Religious Services: Never    Database administrator or Organizations: No    Attends Banker Meetings: Never    Marital Status: Never married  Intimate Partner  Violence: Not At Risk (11/13/2017)   Humiliation, Afraid, Rape, and Kick questionnaire    Fear of Current or Ex-Partner: No    Emotionally Abused: No    Physically Abused: No    Sexually Abused: No    Family History  Problem Relation Age of Onset   Cancer Father    Diabetes Father    Allergic rhinitis Son    Diabetes Brother    Allergic rhinitis Son    Diabetes Paternal Aunt    Lung cancer Cousin    Asthma Neg Hx     Past Surgical History:  Procedure Laterality Date   ABDOMINAL HYSTERECTOMY     1994   carpal tunnel both hands     2012   CLOSED MANIPULATION SHOULDER Left 04/2014   EYE SURGERY Left 08/25/2015   EYE SURGERY Right 09/24/2015   JOINT REPLACEMENT     both knees replacement, 2010   mole removed from face     Nodule removed from back     SHOULDER SURGERY Left 11/2013   TONSILLECTOMY     as teenager    ROS: Review of Systems Negative except as stated above  PHYSICAL EXAM: BP 107/70 (BP Location: Left Arm, Patient Position: Sitting, Cuff Size: Large)   Pulse 66   Temp 98.2 F (36.8 C) (Oral)   Ht 5\' 7"  (1.702 m)   Wt 287 lb (130.2 kg)   SpO2 98%   BMI 44.95 kg/m   Wt Readings from Last 3 Encounters:  02/03/23 287 lb (130.2 kg)  01/01/23 292 lb (132.5 kg)  10/28/22 291 lb (132 kg)    Physical Exam  General appearance - alert, well appearing, and in no distress Mental status - normal mood, behavior, speech, dress, motor activity, and thought processes Neck - supple, no significant adenopathy Chest - clear to auscultation, no wheezes, rales or rhonchi, symmetric air entry Heart - normal rate, regular rhythm, normal S1, S2, no murmurs, rubs, clicks or gallops Musculoskeletal - ambulates unassisted Extremities - peripheral pulses normal, no pedal edema, no clubbing or cyanosis      Latest Ref Rng & Units 01/01/2023   10:08 AM 01/28/2022    4:29 PM 12/12/2021  10:41 AM  CMP  Glucose 70 - 99 mg/dL 161   096   BUN 8 - 27 mg/dL 18   14    Creatinine 0.45 - 1.00 mg/dL 4.09   8.11   Sodium 914 - 144 mmol/L 142   143   Potassium 3.5 - 5.2 mmol/L 3.9   4.4   Chloride 96 - 106 mmol/L 103   101   CO2 20 - 29 mmol/L 23   24   Calcium 8.7 - 10.3 mg/dL 78.2   95.6   Total Protein 6.0 - 8.5 g/dL  7.5    Total Bilirubin 0.0 - 1.2 mg/dL  <2.1    Alkaline Phos 44 - 121 IU/L  69    AST 0 - 40 IU/L  19    ALT 0 - 32 IU/L  14     Lipid Panel     Component Value Date/Time   CHOL 167 01/28/2022 1629   TRIG 175 (H) 01/28/2022 1629   HDL 53 01/28/2022 1629   CHOLHDL 3.2 01/28/2022 1629   CHOLHDL 3.0 07/23/2018 1045   VLDL 20 11/17/2016 1007   LDLCALC 84 01/28/2022 1629   LDLCALC 86 07/23/2018 1045    CBC    Component Value Date/Time   WBC 8.6 01/28/2022 1629   WBC 9.4 11/13/2017 1025   RBC 4.94 01/28/2022 1629   RBC 4.80 11/13/2017 1025   HGB 13.7 01/28/2022 1629   HCT 41.1 01/28/2022 1629   PLT 354 01/28/2022 1629   MCV 83 01/28/2022 1629   MCH 27.7 01/28/2022 1629   MCH 26.9 (L) 11/13/2017 1025   MCHC 33.3 01/28/2022 1629   MCHC 33.3 11/13/2017 1025   RDW 14.6 01/28/2022 1629   LYMPHSABS 2.3 07/29/2018 1443   MONOABS 576 11/17/2016 1007   EOSABS 0.2 07/29/2018 1443   BASOSABS 0.0 07/29/2018 1443    ASSESSMENT AND PLAN: 1. Type 2 diabetes mellitus with morbid obesity (HCC) At goal.  Diet controlled. Encouraged her to continue trying to eat healthy.  Continue to try to move as much as she can. - POCT glycosylated hemoglobin (Hb A1C) - POCT glucose (manual entry) - Microalbumin / creatinine urine ratio - CBC - Hepatic function panel  2. Hypertension associated with diabetes (HCC) At goal.  Continue  Maxide 75/50 mg and lisinopril 10 mg daily.  3. Hyperlipidemia associated with type 2 diabetes mellitus (HCC) Continue Zocor 20 mg daily. - Lipid panel  4.  LT sided sciatica.   Improved with physical therapy.  Continue gabapentin 300 mg at bedtime which she states she has found helpful.  Patient was given  the opportunity to ask questions.  Patient verbalized understanding of the plan and was able to repeat key elements of the plan.   This documentation was completed using Paediatric nurse.  Any transcriptional errors are unintentional.  Orders Placed This Encounter  Procedures   Microalbumin / creatinine urine ratio   CBC   Hepatic function panel   Lipid panel   POCT glycosylated hemoglobin (Hb A1C)   POCT glucose (manual entry)     Requested Prescriptions    No prescriptions requested or ordered in this encounter    Return in about 4 months (around 06/05/2023) for Medicare Wellness Visit in  3-4  wks with CMA.  Jonah Blue, MD, FACP

## 2023-02-04 ENCOUNTER — Ambulatory Visit: Payer: Medicare Other

## 2023-02-04 DIAGNOSIS — M5416 Radiculopathy, lumbar region: Secondary | ICD-10-CM

## 2023-02-04 LAB — CBC
Hematocrit: 41.1 % (ref 34.0–46.6)
Hemoglobin: 13.7 g/dL (ref 11.1–15.9)
MCH: 28.5 pg (ref 26.6–33.0)
MCHC: 33.3 g/dL (ref 31.5–35.7)
MCV: 86 fL (ref 79–97)
Platelets: 374 10*3/uL (ref 150–450)
RBC: 4.8 x10E6/uL (ref 3.77–5.28)
RDW: 14.4 % (ref 11.7–15.4)
WBC: 7.5 10*3/uL (ref 3.4–10.8)

## 2023-02-04 LAB — HEPATIC FUNCTION PANEL
ALT: 16 IU/L (ref 0–32)
AST: 18 IU/L (ref 0–40)
Albumin: 4.8 g/dL (ref 3.9–4.9)
Alkaline Phosphatase: 76 IU/L (ref 44–121)
Bilirubin Total: <0.2 mg/dL (ref 0.0–1.2)
Bilirubin, Direct: <0.1 mg/dL (ref 0.00–0.40)
Total Protein: 7.2 g/dL (ref 6.0–8.5)

## 2023-02-04 LAB — LIPID PANEL
Chol/HDL Ratio: 2.8 ratio (ref 0.0–4.4)
Cholesterol, Total: 173 mg/dL (ref 100–199)
HDL: 61 mg/dL (ref >39)
LDL Chol Calc (NIH): 90 mg/dL (ref 0–99)
Triglycerides: 128 mg/dL (ref 0–149)
VLDL Cholesterol Cal: 22 mg/dL (ref 5–40)

## 2023-02-04 LAB — MICROALBUMIN / CREATININE URINE RATIO
Creatinine, Urine: 56.3 mg/dL
Microalb/Creat Ratio: 8 mg/g{creat} (ref 0–29)
Microalbumin, Urine: 4.3 ug/mL

## 2023-02-04 NOTE — Therapy (Signed)
OUTPATIENT PHYSICAL THERAPY TREATMENT NOTE   Patient Name: Cheryl [name] MRN: [id number] DOB:1955/09/21, 67 y.o., female Today's Date: 02/04/2023  END OF SESSION:  PT End of Session - 02/04/23 1352     Visit Number 7    Number of Visits 13    Date for PT Re-Evaluation 03/11/23    Authorization Type MCR    Progress Note Due on Visit 10    PT Start Time 1400    PT Stop Time 1440    PT Time Calculation (min) 40 min    Activity Tolerance Patient tolerated treatment well;No increased pain    Behavior During Therapy WFL for tasks assessed/performed                   Past Medical History:  Diagnosis Date   Allergic rhinitis, cause unspecified    Arthritis    Asthma    Carpal tunnel syndrome    Cataract    Diabetes mellitus without complication (HCC)    diet- controlled   GERD (gastroesophageal reflux disease)    Glaucoma    Hypertension    Impaired fasting glucose    Morbid obesity (HCC)    OSA (obstructive sleep apnea) 12/02/2016   Other malaise and fatigue    Other specified cardiac dysrhythmias(427.89)    Pain in joint, site unspecified    Symptomatic menopausal or female climacteric states    Syncope and collapse    Past Surgical History:  Procedure Laterality Date   ABDOMINAL HYSTERECTOMY     1994   carpal tunnel both hands     2012   CLOSED MANIPULATION SHOULDER Left 04/2014   EYE SURGERY Left 08/25/2015   EYE SURGERY Right 09/24/2015   JOINT REPLACEMENT     both knees replacement, 2010   mole removed from face     Nodule removed from back     SHOULDER SURGERY Left 11/2013   TONSILLECTOMY     as teenager   Patient Active Problem List   Diagnosis Date Noted   Hyperlipidemia associated with type 2 diabetes mellitus (HCC) 01/25/2021   Type 2 diabetes mellitus with morbid obesity (HCC) 01/25/2021   Moderate persistent asthma, uncomplicated 11/04/2018   OSA (obstructive sleep apnea) 12/02/2016   Hyperlipidemia LDL goal <100 10/21/2014   Essential  hypertension, benign 10/21/2014   DM w/o complication type II (HCC) 09/13/2014   Routine general medical examination at a health care facility 10/12/2013   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 05/03/2013   Need for prophylactic vaccination and inoculation against influenza 02/08/2013   GERD (gastroesophageal reflux disease) 02/08/2013   Other and unspecified hyperlipidemia 11/10/2012   Type II or unspecified type diabetes mellitus without mention of complication, not stated as uncontrolled 10/25/2012   Impaired fasting glucose 09/07/2012   Morbid obesity (HCC) 09/07/2012   Generalized osteoarthrosis, involving multiple sites 09/07/2012   Carpal tunnel syndrome 09/07/2012   Other, multiple, and unspecified sites, insect bite, nonvenomous, without mention of infection(919.4) 09/07/2012   Special screening for malignant neoplasms, colon 09/07/2012   Other specified cardiac dysrhythmias(427.89) 09/07/2012   Candidiasis of vulva and vagina 09/07/2012   Abdominal pain, generalized 09/07/2012   Symptomatic menopausal or female climacteric states 09/07/2012   Hypertension associated with diabetes (HCC) 09/07/2012   Perennial allergic rhinitis 09/07/2012   Pain in joint, site unspecified 09/07/2012   Other malaise and fatigue 09/07/2012    PCP: Marcine Matar, MD  REFERRING PROVIDER: Marcine Matar, MD  REFERRING DIAG: Sciatica  of left side associated with disorder of lumbar spine [M53.86]   Rationale for Evaluation and Treatment: Rehabilitation  THERAPY DIAG:  Radiculopathy, lumbar region  ONSET DATE: 1+ year   SUBJECTIVE:                                                                                                                                                                                           SUBJECTIVE STATEMENT: patient states that she was able to tolerate last treatment session without significant exacerbation of symptoms.  She continues with regular  home walking program and is ambulating without AD.       PERTINENT HISTORY:  PMHx includes: Arthritis, Asthma, DM type II, HTN, Obesity, OSA, syncope, BIL TKR, essential HTN  PAIN:  Are you having pain? See subjective above Pain location: Lt LE to the top of Lt foot  Pain description: burning, aching, "pain"  Aggravating factors: standing (>15-20 min), walking, stairs, heavy lifting/carrying  Relieving factors: injection, heating pad   PRECAUTIONS: None  RED FLAGS: None   WEIGHT BEARING RESTRICTIONS: No  FALLS:  Has patient fallen in last 6 months? No  LIVING ENVIRONMENT: Lives with: lives with their family and lives alone Lives in: House/apartment Stairs: No Has following equipment at home: Single point cane, Environmental consultant - 2 wheeled, shower chair, and Grab bars  OCCUPATION: n/a   PLOF: Independent  PATIENT GOALS: "I hope that I can get out in the park to walk, and to keep up with my 3 y.o. grandbaby."   NEXT MD VISIT: 02/03/23 PCP, 01/21/23    OBJECTIVE: (objective measures completed at initial evaluation unless otherwise dated)   DIAGNOSTIC FINDINGS:  Had MRI lumbar spine 05/05/22 that showed:   IMPRESSION: 1. Mild multilevel degenerative changes of the lumbar spine, slightly progressed at the L4-5 level where there is mild-to-moderate right and mild left foraminal stenosis. 2. No significant canal stenosis at any level.  PATIENT SURVEYS:  FOTO 38 current, 53 predicted  02/02/23 FOTO: 40  COGNITION: Overall cognitive status: Within functional limits for tasks assessed     SENSATION: Not tested  PALPATION: Moderate pain with CPA from L3-S1, and moderate tenderness to palpation along Lt QL and lumbar paraspinals  LUMBAR ROM:   AROM eval  Flexion 80% Reported P! when returning to standing   Extension 50% , increased p!  Right lateral flexion 100%  Left lateral flexion 100%  Right rotation   Left rotation    (Blank rows = not tested)   LOWER  EXTREMITY MMT:    MMT Right eval Left eval  Hip flexion 4+ 4  Hip extension    Hip  abduction 4+ 4+  Hip adduction 5 5  Hip internal rotation    Hip external rotation    Knee flexion    Knee extension 5 5  Ankle dorsiflexion    Ankle plantarflexion    Ankle inversion    Ankle eversion      GAIT: Distance walked: 100 ft Assistive device utilized: None Level of assistance: Modified independence Comments: SPC, ambulates slowly with wide BOS, short step length     TODAY'S TREATMENT:     [facility] Adult PT Treatment:                                                DATE: 02/04/2023  Therapeutic Exercise: Nu step 5 min warmup LE/UE L4 Suitcase carry 5# 3x6ft BIL cues for posture and pacing STS 4x5 from lowest mat cues for pacing and breath control, no UE support  Paloff press red band, standing, 2 x10 BIL cues for posture Isometric walk outs with red band x10 BIL Red band hip 45 deg kickback, 2 x10 BIL cues for posture and reduced circumduction Lateral walking at counter x 3 minutes    [facility] Adult PT Treatment:                                                DATE: 02/02/23 Therapeutic Exercise: Suitcase carry 5# 3x53ft BIL cues for posture and pacing STS 2x5 from lowest mat cues for pacing and breath control, no UE support required Paloff press red band, standing x10 BIL cues for posture Paloff press + OH press (stopping <90 deg due to shoulder hx) red band x8 BIL cues for comfortable ROM and posture Red band hip 45 deg kickback x10 BIL cues for posture and reduced circumduction HEP update + handout/education, continued education/discussion re: pain response with activity, monitoring/modifying activity as appropriate, and walking outside of sessions    [facility] Adult PT Treatment:                                                DATE: 01/30/23 Therapeutic Exercise: Nu step 5 min warmup LE/UE L6 Standing marches 2x5 BIL cues for comfortable ROM, breath control Standing hip 45 deg  kickback x10 BIL cues for reduced circumduction compensations Standing paloff press red band 2x10 BIL cues for posture Red band row 2x15 cues for posture and scap retraction Red band shoulder ext x15 cues for posture and reduced elbow compensations STS lowest mat x5 cues for pacing LTR x10 BIL cues for comfortable ROM and breath control   [facility] Adult PT Treatment:                                                DATE: 01/28/23 Therapeutic Exercise: Nu step warm up 5 min LE/UE Adductor iso w/ ball, seated 2x10 cues for posture and breath control Seated hip abduction red band x10 cues for posture Standing hip abduction red band x10 cues for pacing Standing hip ext red  band x10 cues for pacing Seated paloff press red band 2x10 BIL cues for form, posture HEP update + education    PATIENT EDUCATION:  Education details: rationale for interventions, HEP  Person educated: Patient Education method: Explanation, Demonstration, and Handouts Education comprehension: verbalized understanding, returned demonstration, and needs further education  HOME EXERCISE PROGRAM: Access Code: [email or web address] URL: [email or web address] Date: 01/28/2023 Prepared by: Fransisco Hertz  Exercises - Seated Lumbar Flexion Stretch  - 2 x daily - 7 x weekly - 2 sets - 10 reps - 3 sec hold - Seated Pelvic Tilt  - 2 x daily - 7 x weekly - 2 sets - 10 reps - 3 sec hold - Seated Hip Adduction Isometrics with Ball  - 2-3 x daily - 7 x weekly - 1 sets - 10 reps - Seated Hip Abduction with Resistance  - 2-3 x daily - 7 x weekly - 1 sets - 10 reps  ASSESSMENT:  CLINICAL IMPRESSION: 02/04/2023 Cheryl [name] was able to progress to increased repetitions with exercises today.  She had improved performance of kickback exercise without cueing for decreased circumduction.  She will continue to benefit from progression of core and lower extremity strengthening program.      OBJECTIVE IMPAIRMENTS: decreased activity tolerance,  decreased endurance, decreased mobility, decreased strength, impaired perceived functional ability, impaired UE functional use, improper body mechanics, postural dysfunction, and pain.   ACTIVITY LIMITATIONS: carrying, lifting, bending, standing, squatting, stairs, transfers, and hygiene/grooming  PARTICIPATION LIMITATIONS: meal prep, cleaning, laundry, shopping, community activity, and occupation  PERSONAL FACTORS: Past/current experiences, Time since onset of injury/illness/exacerbation, and 3+ comorbidities: PMHx includes: Arthritis, Asthma, DM type II, HTN, Obesity, OSA, syncope, BIL TKR, essential HTN  are also affecting patient's functional outcome.   REHAB POTENTIAL: Fair    CLINICAL DECISION MAKING: Evolving/moderate complexity  EVALUATION COMPLEXITY: Moderate   GOALS: Goals reviewed with patient? Yes  SHORT TERM GOALS: Target date: 02/11/2023   Patient will be independent with initial home program for LS mobility and LQ strengthening.  Baseline: provided at eval  Goal status: INITIAL    LONG TERM GOALS: Target date: 03/11/2023   Patient will demonstrate improved LS AROM to at least 75% extension.   Baseline:  Lumbar AROM eval  Flexion 80% Reported P! when returning to standing   Extension 50% , increased p!  Right lateral flexion 100%  Left lateral flexion 100%   Goal status: INITIAL  2.  Patient will demonstrate increased LE MMT to 5/5 bilaterally.  Baseline:  LE MMT Right eval Left eval  Hip flexion 4+ 4  Hip abduction 4+ 4+  Hip adduction 5 5  Knee extension 5 5   Goal status: INITIAL  3.  Patient will report improved overall functional ability with FOTO score of 53 or greater.   Baseline: 38 current Goal status: INITIAL  4.  Patient will report improved standing and walking tolerance to 30 minutes or greater for regular walking program and performance of household tasks.  Baseline: 15-20 minutes  Goal status: INITIAL  5.  Patient will  demonstrate ability to ascend/descend stairs x 15 without exacerbation of symptoms.  Baseline: moderate difficulty  Goal status: INITIAL  6.  Patient will demonstrate ability to perform floor to waist lifting of at least 20# using appropriate body mechanics and with no more than minimal pain in order to safely perform normal daily/occupational tasks.   Baseline: unable  Goal status: INITIAL  PLAN:  PT FREQUENCY: 2x/week  PT DURATION: 6 weeks  PLANNED INTERVENTIONS:  Therapeutic exercises, Therapeutic activity, Neuromuscular re-education, Patient/Family education, Self Care, Joint mobilization, Aquatic Therapy, Dry Needling, Electrical stimulation, Cryotherapy, Moist heat, Taping, Traction, Manual therapy, and Re-evaluation.  PLAN FOR NEXT SESSION: continue to progress functional strengthening within pt tolerance and time spent in WB with reduced rest breaks as appropriate. May also be beneficial to incorporate nerve glides or other centralization strategies    Mauri Reading, PT, DPT  02/04/2023 6:55 PM

## 2023-02-05 ENCOUNTER — Telehealth: Payer: Self-pay | Admitting: Internal Medicine

## 2023-02-05 NOTE — Telephone Encounter (Signed)
Called patient to schedule OV . Left message on voicemail to return call.   Will schedule in covering provider slot.

## 2023-02-05 NOTE — Telephone Encounter (Signed)
Pt is calling to schedule AWV please advise CB- [phone]

## 2023-02-06 DIAGNOSIS — I739 Peripheral vascular disease, unspecified: Secondary | ICD-10-CM | POA: Diagnosis not present

## 2023-02-06 DIAGNOSIS — L603 Nail dystrophy: Secondary | ICD-10-CM | POA: Diagnosis not present

## 2023-02-06 DIAGNOSIS — L84 Corns and callosities: Secondary | ICD-10-CM | POA: Diagnosis not present

## 2023-02-06 DIAGNOSIS — E1151 Type 2 diabetes mellitus with diabetic peripheral angiopathy without gangrene: Secondary | ICD-10-CM | POA: Diagnosis not present

## 2023-02-06 DIAGNOSIS — E1142 Type 2 diabetes mellitus with diabetic polyneuropathy: Secondary | ICD-10-CM | POA: Diagnosis not present

## 2023-02-06 NOTE — Therapy (Signed)
OUTPATIENT PHYSICAL THERAPY TREATMENT NOTE   Patient Name: Cheryl [name] MRN: [id number] DOB:22-Sep-1955, 67 y.o., female Today's Date: 02/09/2023  END OF SESSION:  PT End of Session - 02/09/23 1403     Visit Number 8    Number of Visits 13    Date for PT Re-Evaluation 03/11/23    Authorization Type MCR    Progress Note Due on Visit 10    PT Start Time 1403    PT Stop Time 1445    PT Time Calculation (min) 42 min    Activity Tolerance Patient tolerated treatment well;No increased pain    Behavior During Therapy WFL for tasks assessed/performed                    Past Medical History:  Diagnosis Date   Allergic rhinitis, cause unspecified    Arthritis    Asthma    Carpal tunnel syndrome    Cataract    Diabetes mellitus without complication (HCC)    diet- controlled   GERD (gastroesophageal reflux disease)    Glaucoma    Hypertension    Impaired fasting glucose    Morbid obesity (HCC)    OSA (obstructive sleep apnea) 12/02/2016   Other malaise and fatigue    Other specified cardiac dysrhythmias(427.89)    Pain in joint, site unspecified    Symptomatic menopausal or female climacteric states    Syncope and collapse    Past Surgical History:  Procedure Laterality Date   ABDOMINAL HYSTERECTOMY     1994   carpal tunnel both hands     2012   CLOSED MANIPULATION SHOULDER Left 04/2014   EYE SURGERY Left 08/25/2015   EYE SURGERY Right 09/24/2015   JOINT REPLACEMENT     both knees replacement, 2010   mole removed from face     Nodule removed from back     SHOULDER SURGERY Left 11/2013   TONSILLECTOMY     as teenager   Patient Active Problem List   Diagnosis Date Noted   Hyperlipidemia associated with type 2 diabetes mellitus (HCC) 01/25/2021   Type 2 diabetes mellitus with morbid obesity (HCC) 01/25/2021   Moderate persistent asthma, uncomplicated 11/04/2018   OSA (obstructive sleep apnea) 12/02/2016   Hyperlipidemia LDL goal <100 10/21/2014    Essential hypertension, benign 10/21/2014   DM w/o complication type II (HCC) 09/13/2014   Routine general medical examination at a health care facility 10/12/2013   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 05/03/2013   Need for prophylactic vaccination and inoculation against influenza 02/08/2013   GERD (gastroesophageal reflux disease) 02/08/2013   Other and unspecified hyperlipidemia 11/10/2012   Type II or unspecified type diabetes mellitus without mention of complication, not stated as uncontrolled 10/25/2012   Impaired fasting glucose 09/07/2012   Morbid obesity (HCC) 09/07/2012   Generalized osteoarthrosis, involving multiple sites 09/07/2012   Carpal tunnel syndrome 09/07/2012   Other, multiple, and unspecified sites, insect bite, nonvenomous, without mention of infection(919.4) 09/07/2012   Special screening for malignant neoplasms, colon 09/07/2012   Other specified cardiac dysrhythmias(427.89) 09/07/2012   Candidiasis of vulva and vagina 09/07/2012   Abdominal pain, generalized 09/07/2012   Symptomatic menopausal or female climacteric states 09/07/2012   Hypertension associated with diabetes (HCC) 09/07/2012   Perennial allergic rhinitis 09/07/2012   Pain in joint, site unspecified 09/07/2012   Other malaise and fatigue 09/07/2012    PCP: Marcine Matar, MD  REFERRING PROVIDER: Marcine Matar, MD  REFERRING DIAG:  Sciatica of left side associated with disorder of lumbar spine [M53.86]   Rationale for Evaluation and Treatment: Rehabilitation  THERAPY DIAG:  Radiculopathy, lumbar region  ONSET DATE: 1+ year   SUBJECTIVE:                                                                                                                                                                                           SUBJECTIVE STATEMENT: No pain at present, does have some soreness from doing exercises this morning. Feels like things are continuing to improve. Not  really using her SPC much anymore unless she is walking in the park.    PERTINENT HISTORY:  PMHx includes: Arthritis, Asthma, DM type II, HTN, Obesity, OSA, syncope, BIL TKR, essential HTN  PAIN:  Are you having pain? See subjective above Pain location: Lt LE to the top of Lt foot  Pain description: burning, aching, "pain"  Aggravating factors: standing (>15-20 min), walking, stairs, heavy lifting/carrying  Relieving factors: injection, heating pad   PRECAUTIONS: None  RED FLAGS: None   WEIGHT BEARING RESTRICTIONS: No  FALLS:  Has patient fallen in last 6 months? No  LIVING ENVIRONMENT: Lives with: lives with their family and lives alone Lives in: House/apartment Stairs: No Has following equipment at home: Single point cane, Environmental consultant - 2 wheeled, shower chair, and Grab bars  OCCUPATION: n/a   PLOF: Independent  PATIENT GOALS: "I hope that I can get out in the park to walk, and to keep up with my 3 y.o. grandbaby."   NEXT MD VISIT: TBD     OBJECTIVE: (objective measures completed at initial evaluation unless otherwise dated)   DIAGNOSTIC FINDINGS:  Had MRI lumbar spine 05/05/22 that showed:   IMPRESSION: 1. Mild multilevel degenerative changes of the lumbar spine, slightly progressed at the L4-5 level where there is mild-to-moderate right and mild left foraminal stenosis. 2. No significant canal stenosis at any level.  PATIENT SURVEYS:  FOTO 38 current, 53 predicted  02/02/23 FOTO: 40  COGNITION: Overall cognitive status: Within functional limits for tasks assessed     SENSATION: Not tested  PALPATION: Moderate pain with CPA from L3-S1, and moderate tenderness to palpation along Lt QL and lumbar paraspinals  LUMBAR ROM:   AROM eval  Flexion 80% Reported P! when returning to standing   Extension 50% , increased p!  Right lateral flexion 100%  Left lateral flexion 100%  Right rotation   Left rotation    (Blank rows = not tested)   LOWER EXTREMITY  MMT:    MMT Right eval Left eval  Hip flexion 4+ 4  Hip extension  Hip abduction 4+ 4+  Hip adduction 5 5  Hip internal rotation    Hip external rotation    Knee flexion    Knee extension 5 5  Ankle dorsiflexion    Ankle plantarflexion    Ankle inversion    Ankle eversion      GAIT: Distance walked: 100 ft Assistive device utilized: None Level of assistance: Modified independence Comments: SPC, ambulates slowly with wide BOS, short step length     TODAY'S TREATMENT:    [facility] Adult PT Treatment:                                                DATE: 02/09/23 Therapeutic Exercise: Suitcase carry 8# 3x27ft BIL cues for pacing Sciatic nerve glides LLE only 2x8  Standing paloff press red band x10 BIL, green band x10 BIL  Sciatic nerve glide seated x10  STS x8 from raised mat Seated fwd chest press 2# x15 cues for posture Seated fwd chest press 2# seated on dynadisc Sidestepping at counter 2x1:30 HEP update + education    [facility] Adult PT Treatment:                                                DATE: 02/04/2023  Therapeutic Exercise: Nu step 5 min warmup LE/UE L4 Suitcase carry 5# 3x5ft BIL cues for posture and pacing STS 4x5 from lowest mat cues for pacing and breath control, no UE support  Paloff press red band, standing, 2 x10 BIL cues for posture Isometric walk outs with red band x10 BIL Red band hip 45 deg kickback, 2 x10 BIL cues for posture and reduced circumduction Lateral walking at counter x 3 minutes    [facility] Adult PT Treatment:                                                DATE: 02/02/23 Therapeutic Exercise: Suitcase carry 5# 3x43ft BIL cues for posture and pacing STS 2x5 from lowest mat cues for pacing and breath control, no UE support required Paloff press red band, standing x10 BIL cues for posture Paloff press + OH press (stopping <90 deg due to shoulder hx) red band x8 BIL cues for comfortable ROM and posture Red band hip 45 deg kickback x10 BIL cues  for posture and reduced circumduction HEP update + handout/education, continued education/discussion re: pain response with activity, monitoring/modifying activity as appropriate, and walking outside of sessions    [facility] Adult PT Treatment:                                                DATE: 01/30/23 Therapeutic Exercise: Nu step 5 min warmup LE/UE L6 Standing marches 2x5 BIL cues for comfortable ROM, breath control Standing hip 45 deg kickback x10 BIL cues for reduced circumduction compensations Standing paloff press red band 2x10 BIL cues for posture Red band row 2x15 cues for posture and scap retraction Red band shoulder ext x15 cues  for posture and reduced elbow compensations STS lowest mat x5 cues for pacing LTR x10 BIL cues for comfortable ROM and breath control   [facility] Adult PT Treatment:                                                DATE: 01/28/23 Therapeutic Exercise: Nu step warm up 5 min LE/UE Adductor iso w/ ball, seated 2x10 cues for posture and breath control Seated hip abduction red band x10 cues for posture Standing hip abduction red band x10 cues for pacing Standing hip ext red band x10 cues for pacing Seated paloff press red band 2x10 BIL cues for form, posture HEP update + education    PATIENT EDUCATION:  Education details: rationale for interventions, HEP  Person educated: Patient Education method: Explanation, Demonstration, and Handouts Education comprehension: verbalized understanding, returned demonstration, and needs further education  HOME EXERCISE PROGRAM: Access Code: [email or web address] URL: [email or web address] Date: 02/09/2023 Prepared by: Fransisco Hertz  Exercises - Seated Lumbar Flexion Stretch  - 2 x daily - 7 x weekly - 2 sets - 10 reps - 3 sec hold - Sit to Stand with Armchair  - 2 x daily - 7 x weekly - 1 sets - 5 reps - Standing Anti-Rotation Press with Anchored Resistance  - 2 x daily - 7 x weekly - 1 sets - 10 reps - Seated Sciatic  Tensioner  - 2-3 x daily - 7 x weekly - 1 sets - 10 reps  ASSESSMENT:  CLINICAL IMPRESSION: 02/09/2023 Pt arrives w/o pain although she does endorse soreness from doing exercises this morning. Today we are able to progress for increased volume/intensity with core strengthening program - does require rest breaks with lateral stepping due to fatigue but otherwise tolerates well. We are also able to achieve excellent symptom modification with sciatic nerve glides, noted centralization from lateral knee to hip. HEP update as above, no adverse events. Recommend continuing along current POC in order to address relevant deficits and improve functional tolerance. Pt departs today's session in no acute distress, all voiced questions/concerns addressed appropriately from PT perspective.     OBJECTIVE IMPAIRMENTS: decreased activity tolerance, decreased endurance, decreased mobility, decreased strength, impaired perceived functional ability, impaired UE functional use, improper body mechanics, postural dysfunction, and pain.   ACTIVITY LIMITATIONS: carrying, lifting, bending, standing, squatting, stairs, transfers, and hygiene/grooming  PARTICIPATION LIMITATIONS: meal prep, cleaning, laundry, shopping, community activity, and occupation  PERSONAL FACTORS: Past/current experiences, Time since onset of injury/illness/exacerbation, and 3+ comorbidities: PMHx includes: Arthritis, Asthma, DM type II, HTN, Obesity, OSA, syncope, BIL TKR, essential HTN  are also affecting patient's functional outcome.   REHAB POTENTIAL: Fair    CLINICAL DECISION MAKING: Evolving/moderate complexity  EVALUATION COMPLEXITY: Moderate   GOALS: Goals reviewed with patient? Yes  SHORT TERM GOALS: Target date: 02/11/2023   Patient will be independent with initial home program for LS mobility and LQ strengthening.  Baseline: provided at eval  02/09/23: reports good adherence w/ HEP  Goal status: MET     LONG TERM GOALS:  Target date: 03/11/2023   Patient will demonstrate improved LS AROM to at least 75% extension.   Baseline:  Lumbar AROM eval  Flexion 80% Reported P! when returning to standing   Extension 50% , increased p!  Right lateral flexion 100%  Left lateral flexion 100%   Goal status: INITIAL  2.  Patient will demonstrate increased LE MMT to 5/5 bilaterally.  Baseline:  LE MMT Right eval Left eval  Hip flexion 4+ 4  Hip abduction 4+ 4+  Hip adduction 5 5  Knee extension 5 5   Goal status: INITIAL  3.  Patient will report improved overall functional ability with FOTO score of 53 or greater.   Baseline: 38 current Goal status: INITIAL  4.  Patient will report improved standing and walking tolerance to 30 minutes or greater for regular walking program and performance of household tasks.  Baseline: 15-20 minutes  Goal status: INITIAL  5.  Patient will demonstrate ability to ascend/descend stairs x 15 without exacerbation of symptoms.  Baseline: moderate difficulty  Goal status: INITIAL  6.  Patient will demonstrate ability to perform floor to waist lifting of at least 20# using appropriate body mechanics and with no more than minimal pain in order to safely perform normal daily/occupational tasks.   Baseline: unable  Goal status: INITIAL  PLAN:  PT FREQUENCY: 2x/week  PT DURATION: 6 weeks  PLANNED INTERVENTIONS: Therapeutic exercises, Therapeutic activity, Neuromuscular re-education, Patient/Family education, Self Care, Joint mobilization, Aquatic Therapy, Dry Needling, Electrical stimulation, Cryotherapy, Moist heat, Taping, Traction, Manual therapy, and Re-evaluation.  PLAN FOR NEXT SESSION: continue to progress functional strengthening within pt tolerance and time spent in WB with reduced rest breaks as appropriate. Continue with centralization strategies PRN   Ashley Murrain PT, DPT 02/09/2023 3:05 PM

## 2023-02-09 ENCOUNTER — Encounter: Payer: Self-pay | Admitting: Physical Therapy

## 2023-02-09 ENCOUNTER — Ambulatory Visit: Payer: Medicare Other | Admitting: Physical Therapy

## 2023-02-09 DIAGNOSIS — M5416 Radiculopathy, lumbar region: Secondary | ICD-10-CM

## 2023-02-10 ENCOUNTER — Ambulatory Visit (INDEPENDENT_AMBULATORY_CARE_PROVIDER_SITE_OTHER): Payer: Self-pay | Admitting: *Deleted

## 2023-02-10 DIAGNOSIS — J309 Allergic rhinitis, unspecified: Secondary | ICD-10-CM

## 2023-02-11 ENCOUNTER — Ambulatory Visit: Payer: Medicare Other

## 2023-02-11 DIAGNOSIS — M5416 Radiculopathy, lumbar region: Secondary | ICD-10-CM | POA: Diagnosis not present

## 2023-02-11 NOTE — Therapy (Signed)
OUTPATIENT PHYSICAL THERAPY TREATMENT NOTE   Patient Name: Cheryl [name] MRN: [id number] DOB:1956/01/25, 67 y.o., female Today's Date: 02/11/2023  END OF SESSION:  PT End of Session - 02/11/23 1357     Visit Number 9    Number of Visits 13    Date for PT Re-Evaluation 03/11/23    Authorization Type MCR    Progress Note Due on Visit 10    PT Start Time 1400    PT Stop Time 1440    PT Time Calculation (min) 40 min    Activity Tolerance Patient tolerated treatment well;No increased pain    Behavior During Therapy WFL for tasks assessed/performed                     Past Medical History:  Diagnosis Date   Allergic rhinitis, cause unspecified    Arthritis    Asthma    Carpal tunnel syndrome    Cataract    Diabetes mellitus without complication (HCC)    diet- controlled   GERD (gastroesophageal reflux disease)    Glaucoma    Hypertension    Impaired fasting glucose    Morbid obesity (HCC)    OSA (obstructive sleep apnea) 12/02/2016   Other malaise and fatigue    Other specified cardiac dysrhythmias(427.89)    Pain in joint, site unspecified    Symptomatic menopausal or female climacteric states    Syncope and collapse    Past Surgical History:  Procedure Laterality Date   ABDOMINAL HYSTERECTOMY     1994   carpal tunnel both hands     2012   CLOSED MANIPULATION SHOULDER Left 04/2014   EYE SURGERY Left 08/25/2015   EYE SURGERY Right 09/24/2015   JOINT REPLACEMENT     both knees replacement, 2010   mole removed from face     Nodule removed from back     SHOULDER SURGERY Left 11/2013   TONSILLECTOMY     as teenager   Patient Active Problem List   Diagnosis Date Noted   Hyperlipidemia associated with type 2 diabetes mellitus (HCC) 01/25/2021   Type 2 diabetes mellitus with morbid obesity (HCC) 01/25/2021   Moderate persistent asthma, uncomplicated 11/04/2018   OSA (obstructive sleep apnea) 12/02/2016   Hyperlipidemia LDL goal <100 10/21/2014    Essential hypertension, benign 10/21/2014   DM w/o complication type II (HCC) 09/13/2014   Routine general medical examination at a health care facility 10/12/2013   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 05/03/2013   Need for prophylactic vaccination and inoculation against influenza 02/08/2013   GERD (gastroesophageal reflux disease) 02/08/2013   Other and unspecified hyperlipidemia 11/10/2012   Type II or unspecified type diabetes mellitus without mention of complication, not stated as uncontrolled 10/25/2012   Impaired fasting glucose 09/07/2012   Morbid obesity (HCC) 09/07/2012   Generalized osteoarthrosis, involving multiple sites 09/07/2012   Carpal tunnel syndrome 09/07/2012   Other, multiple, and unspecified sites, insect bite, nonvenomous, without mention of infection(919.4) 09/07/2012   Special screening for malignant neoplasms, colon 09/07/2012   Other specified cardiac dysrhythmias(427.89) 09/07/2012   Candidiasis of vulva and vagina 09/07/2012   Abdominal pain, generalized 09/07/2012   Symptomatic menopausal or female climacteric states 09/07/2012   Hypertension associated with diabetes (HCC) 09/07/2012   Perennial allergic rhinitis 09/07/2012   Pain in joint, site unspecified 09/07/2012   Other malaise and fatigue 09/07/2012    PCP: Marcine Matar, MD  REFERRING PROVIDER: Marcine Matar, MD  REFERRING  DIAG: Sciatica of left side associated with disorder of lumbar spine [M53.86]   Rationale for Evaluation and Treatment: Rehabilitation  THERAPY DIAG:  Radiculopathy, lumbar region  ONSET DATE: 1+ year   SUBJECTIVE:                                                                                                                                                                                           SUBJECTIVE STATEMENT: Patient reporting no pain while seated. She hasn't been walking this week d/t the rainy weather. She also states "I'm doing a lot  better from how I started"    PERTINENT HISTORY:  PMHx includes: Arthritis, Asthma, DM type II, HTN, Obesity, OSA, syncope, BIL TKR, essential HTN  PAIN:  Are you having pain? See subjective above Pain location: Lt LE to the top of Lt foot  Pain description: burning, aching, "pain"  Aggravating factors: standing (>15-20 min), walking, stairs, heavy lifting/carrying  Relieving factors: injection, heating pad   PRECAUTIONS: None  RED FLAGS: None   WEIGHT BEARING RESTRICTIONS: No  FALLS:  Has patient fallen in last 6 months? No  LIVING ENVIRONMENT: Lives with: lives with their family and lives alone Lives in: House/apartment Stairs: No Has following equipment at home: Single point cane, Environmental consultant - 2 wheeled, shower chair, and Grab bars  OCCUPATION: n/a   PLOF: Independent  PATIENT GOALS: "I hope that I can get out in the park to walk, and to keep up with my 3 y.o. grandbaby."   NEXT MD VISIT: TBD     OBJECTIVE: (objective measures completed at initial evaluation unless otherwise dated)   DIAGNOSTIC FINDINGS:  Had MRI lumbar spine 05/05/22 that showed:   IMPRESSION: 1. Mild multilevel degenerative changes of the lumbar spine, slightly progressed at the L4-5 level where there is mild-to-moderate right and mild left foraminal stenosis. 2. No significant canal stenosis at any level.  PATIENT SURVEYS:  FOTO 38 current, 53 predicted  02/02/23 FOTO: 40  COGNITION: Overall cognitive status: Within functional limits for tasks assessed     SENSATION: Not tested  PALPATION: Moderate pain with CPA from L3-S1, and moderate tenderness to palpation along Lt QL and lumbar paraspinals  LUMBAR ROM:   AROM eval  Flexion 80% Reported P! when returning to standing   Extension 50% , increased p!  Right lateral flexion 100%  Left lateral flexion 100%  Right rotation   Left rotation    (Blank rows = not tested)   LOWER EXTREMITY MMT:    MMT Right eval Left eval  Hip  flexion 4+ 4  Hip extension    Hip abduction 4+ 4+  Hip adduction 5 5  Hip internal rotation    Hip external rotation    Knee flexion    Knee extension 5 5  Ankle dorsiflexion    Ankle plantarflexion    Ankle inversion    Ankle eversion      GAIT: Distance walked: 100 ft Assistive device utilized: None Level of assistance: Modified independence Comments: SPC, ambulates slowly with wide BOS, short step length     TODAY'S TREATMENT:     [facility] Adult PT Treatment:                                                DATE: 02/11/2023  Therapeutic Exercise:  STS 2 x 10 from raised mat Suitcase carry 8# x169ft BIL cues for pacing Standing paloff press green band 2 x10 BIL  Sciatic nerve glide seated 2x10  Seated fwd chest press 2# seated on dynadisc, 2 x 15 Sidestepping at counter 4 laps  Hooklying ball squeeze, 2 x 10, 3 sec hold  Hooklying clamshells, 2 x 10, GTB Hooklying marches, 2 x 10 (5 each LE), GTB Hooklying bridges, 2 x 5, GTB Nu step 5 min warmup LE/UE L6  [facility] Adult PT Treatment:                                                DATE: 02/09/23 Therapeutic Exercise: Suitcase carry 8# 3x48ft BIL cues for pacing Sciatic nerve glides LLE only 2x8  Standing paloff press red band x10 BIL, green band x10 BIL  Sciatic nerve glide seated x10  STS x8 from raised mat Seated fwd chest press 2# x15 cues for posture Seated fwd chest press 2# seated on dynadisc Sidestepping at counter 2x1:30 HEP update + education    [facility] Adult PT Treatment:                                                DATE: 02/04/2023  Therapeutic Exercise: Nu step 5 min warmup LE/UE L4 Suitcase carry 5# 3x68ft BIL cues for posture and pacing STS 4x5 from lowest mat cues for pacing and breath control, no UE support  Paloff press red band, standing, 2 x10 BIL cues for posture Isometric walk outs with red band x10 BIL Red band hip 45 deg kickback, 2 x10 BIL cues for posture and reduced circumduction Lateral  walking at counter x 3 minutes    [facility] Adult PT Treatment:                                                DATE: 02/02/23 Therapeutic Exercise: Suitcase carry 5# 3x83ft BIL cues for posture and pacing STS 2x5 from lowest mat cues for pacing and breath control, no UE support required Paloff press red band, standing x10 BIL cues for posture Paloff press + OH press (stopping <90 deg due to shoulder hx) red band x8 BIL cues for comfortable ROM and posture Red band hip 45 deg kickback x10 BIL  cues for posture and reduced circumduction HEP update + handout/education, continued education/discussion re: pain response with activity, monitoring/modifying activity as appropriate, and walking outside of sessions    [facility] Adult PT Treatment:                                                DATE: 01/30/23 Therapeutic Exercise: Nu step 5 min warmup LE/UE L6 Standing marches 2x5 BIL cues for comfortable ROM, breath control Standing hip 45 deg kickback x10 BIL cues for reduced circumduction compensations Standing paloff press red band 2x10 BIL cues for posture Red band row 2x15 cues for posture and scap retraction Red band shoulder ext x15 cues for posture and reduced elbow compensations STS lowest mat x5 cues for pacing LTR x10 BIL cues for comfortable ROM and breath control   [facility] Adult PT Treatment:                                                DATE: 01/28/23 Therapeutic Exercise: Nu step warm up 5 min LE/UE Adductor iso w/ ball, seated 2x10 cues for posture and breath control Seated hip abduction red band x10 cues for posture Standing hip abduction red band x10 cues for pacing Standing hip ext red band x10 cues for pacing Seated paloff press red band 2x10 BIL cues for form, posture HEP update + education    PATIENT EDUCATION:  Education details: rationale for interventions, HEP  Person educated: Patient Education method: Explanation, Demonstration, and Handouts Education comprehension: verbalized  understanding, returned demonstration, and needs further education  HOME EXERCISE PROGRAM: Access Code: [email or web address] URL: [email or web address] Date: 02/09/2023 Prepared by: Fransisco Hertz  Exercises - Seated Lumbar Flexion Stretch  - 2 x daily - 7 x weekly - 2 sets - 10 reps - 3 sec hold - Sit to Stand with Armchair  - 2 x daily - 7 x weekly - 1 sets - 5 reps - Standing Anti-Rotation Press with Anchored Resistance  - 2 x daily - 7 x weekly - 1 sets - 10 reps - Seated Sciatic Tensioner  - 2-3 x daily - 7 x weekly - 1 sets - 10 reps  ASSESSMENT:  CLINICAL IMPRESSION: Patient was able to tolerate some progression of core strengthening program today including increased repetitions with seated exercises and further distance for suitcase carry. She has some post-exercise soreness in LE. She will continue to benefit from current core strengthening program as well as sciatic nerve glides for symptom modification/centralization.     OBJECTIVE IMPAIRMENTS: decreased activity tolerance, decreased endurance, decreased mobility, decreased strength, impaired perceived functional ability, impaired UE functional use, improper body mechanics, postural dysfunction, and pain.   ACTIVITY LIMITATIONS: carrying, lifting, bending, standing, squatting, stairs, transfers, and hygiene/grooming  PARTICIPATION LIMITATIONS: meal prep, cleaning, laundry, shopping, community activity, and occupation  PERSONAL FACTORS: Past/current experiences, Time since onset of injury/illness/exacerbation, and 3+ comorbidities: PMHx includes: Arthritis, Asthma, DM type II, HTN, Obesity, OSA, syncope, BIL TKR, essential HTN  are also affecting patient's functional outcome.   REHAB POTENTIAL: Fair    CLINICAL DECISION MAKING: Evolving/moderate complexity  EVALUATION COMPLEXITY: Moderate   GOALS: Goals reviewed with patient? Yes  SHORT TERM GOALS: Target date: 02/11/2023   Patient will be independent with  initial  home program for LS mobility and LQ strengthening.  Baseline: provided at eval  02/09/23: reports good adherence w/ HEP  Goal status: MET     LONG TERM GOALS: Target date: 03/11/2023   Patient will demonstrate improved LS AROM to at least 75% extension.   Baseline:  Lumbar AROM eval  Flexion 80% Reported P! when returning to standing   Extension 50% , increased p!  Right lateral flexion 100%  Left lateral flexion 100%   Goal status: INITIAL  2.  Patient will demonstrate increased LE MMT to 5/5 bilaterally.  Baseline:  LE MMT Right eval Left eval  Hip flexion 4+ 4  Hip abduction 4+ 4+  Hip adduction 5 5  Knee extension 5 5   Goal status: INITIAL  3.  Patient will report improved overall functional ability with FOTO score of 53 or greater.   Baseline: 38 current Goal status: INITIAL  4.  Patient will report improved standing and walking tolerance to 30 minutes or greater for regular walking program and performance of household tasks.  Baseline: 15-20 minutes  Goal status: INITIAL  5.  Patient will demonstrate ability to ascend/descend stairs x 15 without exacerbation of symptoms.  Baseline: moderate difficulty  Goal status: INITIAL  6.  Patient will demonstrate ability to perform floor to waist lifting of at least 20# using appropriate body mechanics and with no more than minimal pain in order to safely perform normal daily/occupational tasks.   Baseline: unable  Goal status: INITIAL  PLAN:  PT FREQUENCY: 2x/week  PT DURATION: 6 weeks  PLANNED INTERVENTIONS: Therapeutic exercises, Therapeutic activity, Neuromuscular re-education, Patient/Family education, Self Care, Joint mobilization, Aquatic Therapy, Dry Needling, Electrical stimulation, Cryotherapy, Moist heat, Taping, Traction, Manual therapy, and Re-evaluation.  PLAN FOR NEXT SESSION: continue to progress functional strengthening within pt tolerance and time spent in WB with reduced rest breaks as  appropriate. Continue with centralization strategies PRN   Mauri Reading, PT, DPT  02/11/2023 2:43 PM

## 2023-02-17 ENCOUNTER — Ambulatory Visit: Payer: Medicare Other

## 2023-02-17 DIAGNOSIS — M5416 Radiculopathy, lumbar region: Secondary | ICD-10-CM

## 2023-02-17 NOTE — Therapy (Signed)
OUTPATIENT PHYSICAL THERAPY TREATMENT NOTE   Patient Name: Cheryl [name] MRN: [id number] DOB:Oct 31, 1955, 67 y.o., female Today's Date: 02/17/2023  END OF SESSION:  PT End of Session - 02/17/23 1400     Visit Number 10    Number of Visits 13    Date for PT Re-Evaluation 03/11/23    Authorization Type MCR    PT Start Time 1401    PT Stop Time 1445    PT Time Calculation (min) 44 min    Activity Tolerance Patient tolerated treatment well;No increased pain    Behavior During Therapy WFL for tasks assessed/performed                      Past Medical History:  Diagnosis Date   Allergic rhinitis, cause unspecified    Arthritis    Asthma    Carpal tunnel syndrome    Cataract    Diabetes mellitus without complication (HCC)    diet- controlled   GERD (gastroesophageal reflux disease)    Glaucoma    Hypertension    Impaired fasting glucose    Morbid obesity (HCC)    OSA (obstructive sleep apnea) 12/02/2016   Other malaise and fatigue    Other specified cardiac dysrhythmias(427.89)    Pain in joint, site unspecified    Symptomatic menopausal or female climacteric states    Syncope and collapse    Past Surgical History:  Procedure Laterality Date   ABDOMINAL HYSTERECTOMY     1994   carpal tunnel both hands     2012   CLOSED MANIPULATION SHOULDER Left 04/2014   EYE SURGERY Left 08/25/2015   EYE SURGERY Right 09/24/2015   JOINT REPLACEMENT     both knees replacement, 2010   mole removed from face     Nodule removed from back     SHOULDER SURGERY Left 11/2013   TONSILLECTOMY     as teenager   Patient Active Problem List   Diagnosis Date Noted   Hyperlipidemia associated with type 2 diabetes mellitus (HCC) 01/25/2021   Type 2 diabetes mellitus with morbid obesity (HCC) 01/25/2021   Moderate persistent asthma, uncomplicated 11/04/2018   OSA (obstructive sleep apnea) 12/02/2016   Hyperlipidemia LDL goal <100 10/21/2014   Essential hypertension, benign  10/21/2014   DM w/o complication type II (HCC) 09/13/2014   Routine general medical examination at a health care facility 10/12/2013   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 05/03/2013   Need for prophylactic vaccination and inoculation against influenza 02/08/2013   GERD (gastroesophageal reflux disease) 02/08/2013   Other and unspecified hyperlipidemia 11/10/2012   Type II or unspecified type diabetes mellitus without mention of complication, not stated as uncontrolled 10/25/2012   Impaired fasting glucose 09/07/2012   Morbid obesity (HCC) 09/07/2012   Generalized osteoarthrosis, involving multiple sites 09/07/2012   Carpal tunnel syndrome 09/07/2012   Other, multiple, and unspecified sites, insect bite, nonvenomous, without mention of infection(919.4) 09/07/2012   Special screening for malignant neoplasms, colon 09/07/2012   Other specified cardiac dysrhythmias(427.89) 09/07/2012   Candidiasis of vulva and vagina 09/07/2012   Abdominal pain, generalized 09/07/2012   Symptomatic menopausal or female climacteric states 09/07/2012   Hypertension associated with diabetes (HCC) 09/07/2012   Perennial allergic rhinitis 09/07/2012   Pain in joint, site unspecified 09/07/2012   Other malaise and fatigue 09/07/2012    PCP: Marcine Matar, MD  REFERRING PROVIDER: Marcine Matar, MD  REFERRING DIAG: Sciatica of left side associated with disorder  of lumbar spine [M53.86]   Rationale for Evaluation and Treatment: Rehabilitation  THERAPY DIAG:  Radiculopathy, lumbar region  ONSET DATE: 1+ year   SUBJECTIVE:                                                                                                                                                                                           SUBJECTIVE STATEMENT: Patient reports that she has some pain today related to the rainy weather. She's states that she is still walking as able.    PERTINENT HISTORY:  PMHx  includes: Arthritis, Asthma, DM type II, HTN, Obesity, OSA, syncope, BIL TKR, essential HTN  PAIN:  Are you having pain? See subjective above Pain location: Lt LE to the top of Lt foot  Pain description: burning, aching, "pain"  Aggravating factors: standing (>15-20 min), walking, stairs, heavy lifting/carrying  Relieving factors: injection, heating pad   PRECAUTIONS: None  RED FLAGS: None   WEIGHT BEARING RESTRICTIONS: No  FALLS:  Has patient fallen in last 6 months? No  LIVING ENVIRONMENT: Lives with: lives with their family and lives alone Lives in: House/apartment Stairs: No Has following equipment at home: Single point cane, Environmental consultant - 2 wheeled, shower chair, and Grab bars  OCCUPATION: n/a   PLOF: Independent  PATIENT GOALS: "I hope that I can get out in the park to walk, and to keep up with my 3 y.o. grandbaby."   NEXT MD VISIT: TBD     OBJECTIVE: (objective measures completed at initial evaluation unless otherwise dated)   DIAGNOSTIC FINDINGS:  Had MRI lumbar spine 05/05/22 that showed:   IMPRESSION: 1. Mild multilevel degenerative changes of the lumbar spine, slightly progressed at the L4-5 level where there is mild-to-moderate right and mild left foraminal stenosis. 2. No significant canal stenosis at any level.  PATIENT SURVEYS:  FOTO 38 current, 53 predicted  02/02/23 FOTO: 40  COGNITION: Overall cognitive status: Within functional limits for tasks assessed     SENSATION: Not tested  PALPATION: Moderate pain with CPA from L3-S1, and moderate tenderness to palpation along Lt QL and lumbar paraspinals  LUMBAR ROM:   AROM eval  Flexion 80% Reported P! when returning to standing   Extension 50% , increased p!  Right lateral flexion 100%  Left lateral flexion 100%  Right rotation   Left rotation    (Blank rows = not tested)   LOWER EXTREMITY MMT:    MMT Right eval Left eval  Hip flexion 4+ 4  Hip extension    Hip abduction 4+ 4+  Hip  adduction 5 5  Hip internal rotation    Hip external  rotation    Knee flexion    Knee extension 5 5  Ankle dorsiflexion    Ankle plantarflexion    Ankle inversion    Ankle eversion      GAIT: Distance walked: 100 ft Assistive device utilized: None Level of assistance: Modified independence Comments: SPC, ambulates slowly with wide BOS, short step length     TODAY'S TREATMENT:      [facility] Adult PT Treatment:                                                DATE: 02/17/2023  Therapeutic Exercise:  Suitcase carry 8# x140ft BIL cues for pacing Standing paloff press green band 2 x10 BIL  Standing hip abduction (from toe tap), 2 x10 each  Standing hip extension (from toe tap), 2 x 10 each  Dead bug modification with ball, LE only, x3 to fatigue Nu step 5 min warmup LE/UE L6  [facility] Adult PT Treatment:                                                DATE: 02/11/2023  Therapeutic Exercise:  STS 2 x 10 from raised mat Suitcase carry 8# x148ft BIL cues for pacing Standing paloff press green band 2 x10 BIL  Sciatic nerve glide seated 2x10  Seated fwd chest press 2# seated on dynadisc, 2 x 15 Sidestepping at counter 4 laps  Hooklying ball squeeze, 2 x 10, 3 sec hold  Hooklying clamshells, 2 x 10, GTB Hooklying marches, 2 x 10 (5 each LE), GTB Hooklying bridges, 2 x 5, GTB Nu step 5 min warmup LE/UE L6  [facility] Adult PT Treatment:                                                DATE: 02/09/23 Therapeutic Exercise: Suitcase carry 8# 3x6ft BIL cues for pacing Sciatic nerve glides LLE only 2x8  Standing paloff press red band x10 BIL, green band x10 BIL  Sciatic nerve glide seated x10  STS x8 from raised mat Seated fwd chest press 2# x15 cues for posture Seated fwd chest press 2# seated on dynadisc Sidestepping at counter 2x1:30 HEP update + education    [facility] Adult PT Treatment:                                                DATE: 02/04/2023  Therapeutic Exercise: Nu step 5 min  warmup LE/UE L4 Suitcase carry 5# 3x4ft BIL cues for posture and pacing STS 4x5 from lowest mat cues for pacing and breath control, no UE support  Paloff press red band, standing, 2 x10 BIL cues for posture Isometric walk outs with red band x10 BIL Red band hip 45 deg kickback, 2 x10 BIL cues for posture and reduced circumduction Lateral walking at counter x 3 minutes    PATIENT EDUCATION:  Education details: rationale for interventions, HEP  Person educated: Patient Education method: Explanation, Demonstration, and Handouts  Education comprehension: verbalized understanding, returned demonstration, and needs further education  HOME EXERCISE PROGRAM: Access Code: [email or web address] URL: [email or web address] Date: 02/09/2023 Prepared by: Fransisco Hertz  Exercises - Seated Lumbar Flexion Stretch  - 2 x daily - 7 x weekly - 2 sets - 10 reps - 3 sec hold - Sit to Stand with Armchair  - 2 x daily - 7 x weekly - 1 sets - 5 reps - Standing Anti-Rotation Press with Anchored Resistance  - 2 x daily - 7 x weekly - 1 sets - 10 reps - Seated Sciatic Tensioner  - 2-3 x daily - 7 x weekly - 1 sets - 10 reps  ASSESSMENT:  CLINICAL IMPRESSION: Khai continues to be limited with core strengthening exercises d/t Lt shoulder pain. However, she is motivated to continue with PT exercises to further improve her lumbar and LE symptoms. We will continue to progress as tolerated to reach established goals.    OBJECTIVE IMPAIRMENTS: decreased activity tolerance, decreased endurance, decreased mobility, decreased strength, impaired perceived functional ability, impaired UE functional use, improper body mechanics, postural dysfunction, and pain.   ACTIVITY LIMITATIONS: carrying, lifting, bending, standing, squatting, stairs, transfers, and hygiene/grooming  PARTICIPATION LIMITATIONS: meal prep, cleaning, laundry, shopping, community activity, and occupation  PERSONAL FACTORS: Past/current experiences,  Time since onset of injury/illness/exacerbation, and 3+ comorbidities: PMHx includes: Arthritis, Asthma, DM type II, HTN, Obesity, OSA, syncope, BIL TKR, essential HTN  are also affecting patient's functional outcome.   REHAB POTENTIAL: Fair    CLINICAL DECISION MAKING: Evolving/moderate complexity  EVALUATION COMPLEXITY: Moderate   GOALS: Goals reviewed with patient? Yes  SHORT TERM GOALS: Target date: 02/11/2023   Patient will be independent with initial home program for LS mobility and LQ strengthening.  Baseline: provided at eval  02/09/23: reports good adherence w/ HEP  Goal status: MET     LONG TERM GOALS: Target date: 03/11/2023   Patient will demonstrate improved LS AROM to at least 75% extension.   Baseline:  Lumbar AROM eval  Flexion 80% Reported P! when returning to standing   Extension 50% , increased p!  Right lateral flexion 100%  Left lateral flexion 100%   Goal status: INITIAL  2.  Patient will demonstrate increased LE MMT to 5/5 bilaterally.  Baseline:  LE MMT Right eval Left eval  Hip flexion 4+ 4  Hip abduction 4+ 4+  Hip adduction 5 5  Knee extension 5 5   Goal status: INITIAL  3.  Patient will report improved overall functional ability with FOTO score of 53 or greater.   Baseline: 38 current Goal status: INITIAL  4.  Patient will report improved standing and walking tolerance to 30 minutes or greater for regular walking program and performance of household tasks.  Baseline: 15-20 minutes  Goal status: INITIAL  5.  Patient will demonstrate ability to ascend/descend stairs x 15 without exacerbation of symptoms.  Baseline: moderate difficulty  Goal status: INITIAL  6.  Patient will demonstrate ability to perform floor to waist lifting of at least 20# using appropriate body mechanics and with no more than minimal pain in order to safely perform normal daily/occupational tasks.   Baseline: unable  Goal status: INITIAL  PLAN:  PT  FREQUENCY: 2x/week  PT DURATION: 6 weeks  PLANNED INTERVENTIONS: Therapeutic exercises, Therapeutic activity, Neuromuscular re-education, Patient/Family education, Self Care, Joint mobilization, Aquatic Therapy, Dry Needling, Electrical stimulation, Cryotherapy, Moist heat, Taping, Traction, Manual therapy, and Re-evaluation.  PLAN FOR NEXT SESSION: continue to progress functional strengthening within pt  tolerance and time spent in WB with reduced rest breaks as appropriate. Continue with centralization strategies PRN   Mauri Reading, PT, DPT  02/17/2023 3:06 PM

## 2023-02-18 ENCOUNTER — Ambulatory Visit (INDEPENDENT_AMBULATORY_CARE_PROVIDER_SITE_OTHER): Payer: Medicare Other

## 2023-02-18 DIAGNOSIS — J309 Allergic rhinitis, unspecified: Secondary | ICD-10-CM | POA: Diagnosis not present

## 2023-02-19 ENCOUNTER — Ambulatory Visit: Payer: Medicare Other

## 2023-02-19 DIAGNOSIS — M5416 Radiculopathy, lumbar region: Secondary | ICD-10-CM | POA: Diagnosis not present

## 2023-02-19 NOTE — Therapy (Signed)
OUTPATIENT PHYSICAL THERAPY TREATMENT NOTE   Patient Name: Cheryl [name] MRN: [id number] DOB:15-Jul-1955, 67 y.o., female Today's Date: 02/19/2023  END OF SESSION:  PT End of Session - 02/19/23 1436     Visit Number 11    Number of Visits 15    Date for PT Re-Evaluation 03/21/23    Authorization Type MCR    PT Start Time 1445    PT Stop Time 1517    PT Time Calculation (min) 32 min    Activity Tolerance Patient tolerated treatment well;No increased pain    Behavior During Therapy WFL for tasks assessed/performed                       Past Medical History:  Diagnosis Date   Allergic rhinitis, cause unspecified    Arthritis    Asthma    Carpal tunnel syndrome    Cataract    Diabetes mellitus without complication (HCC)    diet- controlled   GERD (gastroesophageal reflux disease)    Glaucoma    Hypertension    Impaired fasting glucose    Morbid obesity (HCC)    OSA (obstructive sleep apnea) 12/02/2016   Other malaise and fatigue    Other specified cardiac dysrhythmias(427.89)    Pain in joint, site unspecified    Symptomatic menopausal or female climacteric states    Syncope and collapse    Past Surgical History:  Procedure Laterality Date   ABDOMINAL HYSTERECTOMY     1994   carpal tunnel both hands     2012   CLOSED MANIPULATION SHOULDER Left 04/2014   EYE SURGERY Left 08/25/2015   EYE SURGERY Right 09/24/2015   JOINT REPLACEMENT     both knees replacement, 2010   mole removed from face     Nodule removed from back     SHOULDER SURGERY Left 11/2013   TONSILLECTOMY     as teenager   Patient Active Problem List   Diagnosis Date Noted   Hyperlipidemia associated with type 2 diabetes mellitus (HCC) 01/25/2021   Type 2 diabetes mellitus with morbid obesity (HCC) 01/25/2021   Moderate persistent asthma, uncomplicated 11/04/2018   OSA (obstructive sleep apnea) 12/02/2016   Hyperlipidemia LDL goal <100 10/21/2014   Essential hypertension, benign  10/21/2014   DM w/o complication type II (HCC) 09/13/2014   Routine general medical examination at a health care facility 10/12/2013   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 05/03/2013   Need for prophylactic vaccination and inoculation against influenza 02/08/2013   GERD (gastroesophageal reflux disease) 02/08/2013   Other and unspecified hyperlipidemia 11/10/2012   Type II or unspecified type diabetes mellitus without mention of complication, not stated as uncontrolled 10/25/2012   Impaired fasting glucose 09/07/2012   Morbid obesity (HCC) 09/07/2012   Generalized osteoarthrosis, involving multiple sites 09/07/2012   Carpal tunnel syndrome 09/07/2012   Other, multiple, and unspecified sites, insect bite, nonvenomous, without mention of infection(919.4) 09/07/2012   Special screening for malignant neoplasms, colon 09/07/2012   Other specified cardiac dysrhythmias(427.89) 09/07/2012   Candidiasis of vulva and vagina 09/07/2012   Abdominal pain, generalized 09/07/2012   Symptomatic menopausal or female climacteric states 09/07/2012   Hypertension associated with diabetes (HCC) 09/07/2012   Perennial allergic rhinitis 09/07/2012   Pain in joint, site unspecified 09/07/2012   Other malaise and fatigue 09/07/2012    PCP: Marcine Matar, MD  REFERRING PROVIDER: Marcine Matar, MD  REFERRING DIAG: Sciatica of left side associated with  disorder of lumbar spine [M53.86]   Rationale for Evaluation and Treatment: Rehabilitation  THERAPY DIAG:  Radiculopathy, lumbar region  ONSET DATE: 1+ year   SUBJECTIVE:                                                                                                                                                                                           SUBJECTIVE STATEMENT: Patient reporting about 70% improvement since starting PT. She states that she would like to be able to walk 30 minutes regularly. She states that she is mostly  able to keep up with her grandbaby. She would like to continue with PT 1x/week for remainder of her POC in order to address remaining pain/difficulty related to lumbar pain, and activity tolerance.  She is also hoping to be able to receive a referral to have her Lt shoulder treated by [place] in the future.    PERTINENT HISTORY:  PMHx includes: Arthritis, Asthma, DM type II, HTN, Obesity, OSA, syncope, BIL TKR, essential HTN  PAIN:  Are you having pain? 4/10 at worst as of 02/19/23 Pain location: Lt LE to the top of Lt foot  Pain description: burning, aching, "pain"  Aggravating factors: standing (>15-20 min), walking, stairs, heavy lifting/carrying  Relieving factors: injection, heating pad   PRECAUTIONS: None  RED FLAGS: None   WEIGHT BEARING RESTRICTIONS: No  FALLS:  Has patient fallen in last 6 months? No  LIVING ENVIRONMENT: Lives with: lives with their family and lives alone Lives in: House/apartment Stairs: No Has following equipment at home: Single point cane, Environmental consultant - 2 wheeled, shower chair, and Grab bars  OCCUPATION: n/a   PLOF: Independent  PATIENT GOALS: "I hope that I can get out in the park to walk, and to keep up with my 3 y.o. grandbaby."   NEXT MD VISIT: TBD     OBJECTIVE: (objective measures completed at initial evaluation unless otherwise dated)   DIAGNOSTIC FINDINGS:  Had MRI lumbar spine 05/05/22 that showed:   IMPRESSION: 1. Mild multilevel degenerative changes of the lumbar spine, slightly progressed at the L4-5 level where there is mild-to-moderate right and mild left foraminal stenosis. 2. No significant canal stenosis at any level.  PATIENT SURVEYS:  FOTO 38 current, 53 predicted  02/02/23 FOTO: 40  COGNITION: Overall cognitive status: Within functional limits for tasks assessed     SENSATION: Not tested  PALPATION: Moderate pain with CPA from L3-S1, and moderate tenderness to palpation along Lt QL and lumbar paraspinals  LUMBAR ROM:    AROM eval 02/19/23  Flexion 80% Reported P! when returning to standing  90%, only limited by HS tightness  Extension 50% , increased p! 75%  Right lateral flexion 100% 100%  Left lateral flexion 100% 100%  Right rotation    Left rotation     (Blank rows = not tested)   LOWER EXTREMITY MMT:    MMT Right eval Left eval Right 02/19/23 Left 02/19/23  Hip flexion 4+ 4 4+ 4+  Hip extension      Hip abduction 4+ 4+ 4+ 4+  Hip adduction 5 5    Hip internal rotation      Hip external rotation      Knee flexion      Knee extension 5 5    Ankle dorsiflexion      Ankle plantarflexion      Ankle inversion      Ankle eversion        GAIT: Distance walked: 100 ft Assistive device utilized: None Level of assistance: Modified independence Comments: SPC, ambulates slowly with wide BOS, short step length     TODAY'S TREATMENT:     [facility] Adult PT Treatment:                                                DATE: 02/19/2023  Therapeutic Activity:  Reassessment of objective measures and subjective assessment regarding progress towards established goals and plan for extended POC to address remaining deficits    [facility] Adult PT Treatment:                                                DATE: 02/17/2023  Therapeutic Exercise:  Suitcase carry 8# x160ft BIL cues for pacing Standing paloff press green band 2 x10 BIL  Standing hip abduction (from toe tap), 2 x10 each  Standing hip extension (from toe tap), 2 x 10 each  Dead bug modification with ball, LE only, x3 to fatigue Nu step 5 min warmup LE/UE L6  [facility] Adult PT Treatment:                                                DATE: 02/11/2023  Therapeutic Exercise:  STS 2 x 10 from raised mat Suitcase carry 8# x158ft BIL cues for pacing Standing paloff press green band 2 x10 BIL  Sciatic nerve glide seated 2x10  Seated fwd chest press 2# seated on dynadisc, 2 x 15 Sidestepping at counter 4 laps  Hooklying ball squeeze, 2 x 10, 3 sec hold   Hooklying clamshells, 2 x 10, GTB Hooklying marches, 2 x 10 (5 each LE), GTB Hooklying bridges, 2 x 5, GTB Nu step 5 min warmup LE/UE L6   [facility] Adult PT Treatment:                                                DATE: 02/09/23 Therapeutic Exercise: Suitcase carry 8# 3x89ft BIL cues for pacing Sciatic nerve glides LLE only 2x8  Standing paloff press red band x10 BIL, green band x10 BIL  Sciatic nerve glide seated  x10  STS x8 from raised mat Seated fwd chest press 2# x15 cues for posture Seated fwd chest press 2# seated on dynadisc Sidestepping at counter 2x1:30 HEP update + education    PATIENT EDUCATION:  Education details: rationale for interventions, HEP  Person educated: Patient Education method: Explanation, Demonstration, and Handouts Education comprehension: verbalized understanding, returned demonstration, and needs further education  HOME EXERCISE PROGRAM: Access Code: [email or web address] URL: [email or web address] Date: 02/09/2023 Prepared by: Fransisco Hertz  Exercises - Seated Lumbar Flexion Stretch  - 2 x daily - 7 x weekly - 2 sets - 10 reps - 3 sec hold - Sit to Stand with Armchair  - 2 x daily - 7 x weekly - 1 sets - 5 reps - Standing Anti-Rotation Press with Anchored Resistance  - 2 x daily - 7 x weekly - 1 sets - 10 reps - Seated Sciatic Tensioner  - 2-3 x daily - 7 x weekly - 1 sets - 10 reps  ASSESSMENT:  CLINICAL IMPRESSION: Patient has attended 11 total PT sessions, and is demonstrating improvements with lumbar spine AROM, lower extremity strength, and overall activity tolerance.  She does continue to be limited with stair navigation, walking tolerance, navigation of hills and compliant surfaces, and heavy lifting from floor to counter height.  Plan is to continue for 4 weeks with decreased frequency to 1 visit per week to address remaining deficits.    OBJECTIVE IMPAIRMENTS: decreased activity tolerance, decreased endurance, decreased mobility,  decreased strength, impaired perceived functional ability, impaired UE functional use, improper body mechanics, postural dysfunction, and pain.   ACTIVITY LIMITATIONS: carrying, lifting, bending, standing, squatting, stairs, transfers, and hygiene/grooming  PARTICIPATION LIMITATIONS: meal prep, cleaning, laundry, shopping, community activity, and occupation  PERSONAL FACTORS: Past/current experiences, Time since onset of injury/illness/exacerbation, and 3+ comorbidities: PMHx includes: Arthritis, Asthma, DM type II, HTN, Obesity, OSA, syncope, BIL TKR, essential HTN  are also affecting patient's functional outcome.   REHAB POTENTIAL: Fair    CLINICAL DECISION MAKING: Evolving/moderate complexity  EVALUATION COMPLEXITY: Moderate   GOALS: Goals reviewed with patient? Yes  SHORT TERM GOALS: Target date: 02/11/2023   Patient will be independent with initial home program for LS mobility and LQ strengthening.  Baseline: provided at eval  02/09/23: reports good adherence w/ HEP  Goal status: MET     LONG TERM GOALS: Target date: 03/21/2023, last updated 02/19/23   Patient will demonstrate improved LS AROM to at least 75% extension.   Baseline:  AROM eval 02/19/23  Flexion 80% Reported P! when returning to standing  90%, only limited by HS tightness   Extension 50% , increased p! 75%  Right lateral flexion 100% 100%  Left lateral flexion 100% 100%   Goal status: ONGOING  2.  Patient will demonstrate increased LE MMT to 5/5 bilaterally.  Baseline:  MMT Right eval Left eval Right 02/19/23 Left 02/19/23  Hip flexion 4+ 4 4+ 4+  Hip abduction 4+ 4+ 4+ 4+  Hip adduction 5 5    Knee extension 5 5     Goal status: ONGOING  3.  Patient will report improved overall functional ability with FOTO score of 53 or greater.   Baseline: 38 current 02/19/23: 58 Goal status: MET  4.  Patient will report improved standing and walking tolerance to 30 minutes or greater for regular walking  program and performance of household tasks.  Baseline: 15-20 minutes  02/19/23: 20-25 minutes  Goal status: ONGOING  5.  Patient will demonstrate ability to ascend/descend stairs  x 15 without exacerbation of symptoms.  Baseline: moderate difficulty  02/19/23: minimal difficulty; patient has a ramp at home, and reports that she doesn't need to use stairs at home.  Goal status: PARTIALLY MET   6.  Patient will demonstrate ability to perform floor to waist lifting of at least 20# using appropriate body mechanics and with no more than minimal pain in order to safely perform normal daily/occupational tasks.   Baseline: unable  02/19/23: able to lift her grandbaby if necessary, but unable to carry her.  Goal status: ONGOING  PLAN:  PT FREQUENCY: 1x/week, updated 02/19/23  PT DURATION: 4 weeks, updated 02/19/23  PLANNED INTERVENTIONS: Therapeutic exercises, Therapeutic activity, Neuromuscular re-education, Patient/Family education, Self Care, Joint mobilization, Aquatic Therapy, Dry Needling, Electrical stimulation, Cryotherapy, Moist heat, Taping, Traction, Manual therapy, and Re-evaluation.  PLAN FOR NEXT SESSION: continue to progress functional strengthening within pt tolerance and time spent in WB with reduced rest breaks as appropriate. Continue with centralization strategies PRN   Mauri Reading, PT, DPT  02/19/2023 6:43 PM

## 2023-02-22 NOTE — Progress Notes (Unsigned)
Cheryl [name]    [id number]    1956/01/26  Primary Care Physician:Carter, Maxine Glenn, DO  HPI female never smoker followed for OSA, complicated by asthma( Dr Isaiah Serge), DM 2, HBP, GERD, allergic rhinitis/allergy vaccine (Dr [place] Callas), glaucoma NPSG- 11/25/16- AHI 40.3/hour, desaturation to 88%, body weight 286 pounds  ----------------------------------------------------------------   10/16/22- 67 year old female never smoker(son is Sports administrator) followed for OSA, complicated by Asthma( Dr Isaiah Serge), DM 2, HBP, GERD, Allergic Rhinitis/Allergy Vaccine   CPAP auto 5-15/ [facility]            AirSense 10 AutoSet Download-compliance 100%, AHI 9.6/ hr Body weight today-296 lbs Download reviewed.  Went well with CPAP. Had another COVID infection for which she went to urgent care.  Spring pollen rhinitis also flaring.  02/23/23- 67 year old female never smoker(son is Sports administrator) followed for OSA, complicated by Asthma( Dr Isaiah Serge), DM 2, HBP, GERD, Allergic Rhinitis/Allergy Vaccine   -Albuterol hfa, Symbicort 160,  CPAP auto 5-15/ [facility]            AirSense 10 AutoSet Download-compliance 100%, AHI 6/hr (from last machine up to August 6) Body weight today-275  lbs Download reviewed. Had flu vax. Machine recently replaced. Working fine, but AirView not installed. Had Covid last December and sinusitis in June. Both resolved and she feels well now.Has had flu and covid vax.  ROS-see HPI   + = positive Constitutional:    weight loss, night sweats, fevers, chills, fatigue, lassitude. HEENT:    headaches, difficulty swallowing, tooth/dental problems, sore throat,       sneezing, itching, ear ache, nasal congestion, post nasal drip, snoring CV:    chest pain, orthopnea, PND, swelling in lower extremities, anasarca,                                            dizziness, palpitations Resp:   shortness of breath with exertion or at rest.                productive cough,   non-productive cough, coughing up of  blood.              change in color of mucus.  wheezing.   Skin:    rash or lesions. GI:  No-   heartburn, indigestion, abdominal pain, nausea, vomiting, diarrhea,                 change in bowel habits, loss of appetite GU: dysuria, change in color of urine, no urgency or frequency.   flank pain. MS:   joint pain, stiffness, decreased range of motion, back pain. Neuro-    +syncopal episode Psych:  change in mood or affect.  depression or anxiety.   memory loss.  OBJ- Physical Exam General- Alert, Oriented, Affect-appropriate, Distress- none acute,  + morbidly obese Skin- rash-none, lesions- none, excoriation- none Lymphadenopathy- none Head- atraumatic            Eyes- Gross vision intact, PERRLA, conjunctivae and secretions clear            Ears- Hearing, canals-normal            Nose- Clear, no-Septal dev, mucus, polyps, erosion, perforation             Throat- Mallampati IV , mucosa clear , drainage- none, tonsils- atrophic, + own teeth Neck- flexible , trachea midline, no stridor ,  thyroid nl, carotid no bruit Chest - symmetrical excursion , unlabored           Heart/CV- RRR , +1/6S murmur , no gallop  , no rub, nl s1 s2                           - JVD- none , edema- none, stasis changes- none, varices- none           Lung- clear to P&A, wheeze- none, cough- none , dullness-none, rub- none           Chest wall-  Abd-  Br/ Gen/ Rectal- Not done, not indicated Extrem- cyanosis- none, clubbing, none, atrophy- none, strength- nl Neuro- grossly intact to observation

## 2023-02-23 ENCOUNTER — Encounter: Payer: Self-pay | Admitting: Internal Medicine

## 2023-02-23 ENCOUNTER — Ambulatory Visit (INDEPENDENT_AMBULATORY_CARE_PROVIDER_SITE_OTHER): Payer: Medicare Other | Admitting: Internal Medicine

## 2023-02-23 ENCOUNTER — Ambulatory Visit (INDEPENDENT_AMBULATORY_CARE_PROVIDER_SITE_OTHER): Payer: Medicare Other

## 2023-02-23 VITALS — BP 103/67 | HR 65 | Temp 98.3°F | Resp 18 | Ht 69.0 in | Wt 275.0 lb

## 2023-02-23 DIAGNOSIS — J309 Allergic rhinitis, unspecified: Secondary | ICD-10-CM | POA: Diagnosis not present

## 2023-02-23 DIAGNOSIS — G4733 Obstructive sleep apnea (adult) (pediatric): Secondary | ICD-10-CM | POA: Diagnosis not present

## 2023-02-23 DIAGNOSIS — Z23 Encounter for immunization: Secondary | ICD-10-CM

## 2023-02-23 NOTE — Patient Instructions (Signed)
Order- DME [facility]- please tag [place] in AirView or add SD card. Continue CPAP auto 5-15  Please call if we can help

## 2023-02-23 NOTE — Assessment & Plan Note (Signed)
Benefits from CPAP with good compliance and control.  Plan- continue auto 5-15. Needs AirView on this machine.

## 2023-02-23 NOTE — Assessment & Plan Note (Signed)
Encourage life-style changes for weight loss.

## 2023-02-24 ENCOUNTER — Ambulatory Visit: Payer: Medicare Other | Attending: Internal Medicine | Admitting: Physical Therapy

## 2023-02-24 ENCOUNTER — Encounter: Payer: Self-pay | Admitting: Physical Therapy

## 2023-02-24 DIAGNOSIS — M5416 Radiculopathy, lumbar region: Secondary | ICD-10-CM | POA: Diagnosis not present

## 2023-02-24 NOTE — Therapy (Signed)
OUTPATIENT PHYSICAL THERAPY TREATMENT NOTE   Patient Name: Cheryl [name] MRN: [id number] DOB:07/31/1955, 67 y.o., female Today's Date: 02/24/2023  END OF SESSION:  PT End of Session - 02/24/23 1354     Visit Number 12    Number of Visits 15    Date for PT Re-Evaluation 03/21/23    Authorization Type MCR    PT Start Time 1355    PT Stop Time 1441    PT Time Calculation (min) 46 min    Activity Tolerance Patient tolerated treatment well;No increased pain    Behavior During Therapy WFL for tasks assessed/performed                Past Medical History:  Diagnosis Date   Allergic rhinitis, cause unspecified    Arthritis    Asthma    Carpal tunnel syndrome    Cataract    Diabetes mellitus without complication (HCC)    diet- controlled   GERD (gastroesophageal reflux disease)    Glaucoma    Hypertension    Impaired fasting glucose    Morbid obesity (HCC)    OSA (obstructive sleep apnea) 12/02/2016   Other malaise and fatigue    Other specified cardiac dysrhythmias(427.89)    Pain in joint, site unspecified    Symptomatic menopausal or female climacteric states    Syncope and collapse    Past Surgical History:  Procedure Laterality Date   ABDOMINAL HYSTERECTOMY     1994   carpal tunnel both hands     2012   CLOSED MANIPULATION SHOULDER Left 04/2014   EYE SURGERY Left 08/25/2015   EYE SURGERY Right 09/24/2015   JOINT REPLACEMENT     both knees replacement, 2010   mole removed from face     Nodule removed from back     SHOULDER SURGERY Left 11/2013   TONSILLECTOMY     as teenager   Patient Active Problem List   Diagnosis Date Noted   Hyperlipidemia associated with type 2 diabetes mellitus (HCC) 01/25/2021   Type 2 diabetes mellitus with morbid obesity (HCC) 01/25/2021   Moderate persistent asthma, uncomplicated 11/04/2018   OSA (obstructive sleep apnea) 12/02/2016   Hyperlipidemia LDL goal <100 10/21/2014   Essential hypertension, benign 10/21/2014   DM  w/o complication type II (HCC) 09/13/2014   Routine general medical examination at a health care facility 10/12/2013   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 05/03/2013   Need for prophylactic vaccination and inoculation against influenza 02/08/2013   GERD (gastroesophageal reflux disease) 02/08/2013   Other and unspecified hyperlipidemia 11/10/2012   Type II or unspecified type diabetes mellitus without mention of complication, not stated as uncontrolled 10/25/2012   Impaired fasting glucose 09/07/2012   Morbid obesity (HCC) 09/07/2012   Generalized osteoarthrosis, involving multiple sites 09/07/2012   Carpal tunnel syndrome 09/07/2012   Other, multiple, and unspecified sites, insect bite, nonvenomous, without mention of infection(919.4) 09/07/2012   Special screening for malignant neoplasms, colon 09/07/2012   Other specified cardiac dysrhythmias(427.89) 09/07/2012   Candidiasis of vulva and vagina 09/07/2012   Abdominal pain, generalized 09/07/2012   Symptomatic menopausal or female climacteric states 09/07/2012   Hypertension associated with diabetes (HCC) 09/07/2012   Perennial allergic rhinitis 09/07/2012   Pain in joint 09/07/2012   Other malaise and fatigue 09/07/2012    PCP: Marcine Matar, MD  REFERRING PROVIDER: Marcine Matar, MD  REFERRING DIAG: Sciatica of left side associated with disorder of lumbar spine [M53.86]   Rationale for  Evaluation and Treatment: Rehabilitation  THERAPY DIAG:  Radiculopathy, lumbar region  ONSET DATE: 1+ year   SUBJECTIVE:                                                                                                                                                                                           SUBJECTIVE STATEMENT: Pt states her LE pain is reducing in frequency and intensity, functional tolerance continuing to improve. A little sore after last session, back to baseline by next day. Has been walking more and  doing stationary bike at home.    PERTINENT HISTORY:  PMHx includes: Arthritis, Asthma, DM type II, HTN, Obesity, OSA, syncope, BIL TKR, essential HTN  PAIN:  Are you having pain? 0-3/10 over past week, no pain at present 02/24/23 Pain location: Lt LE to the top of Lt foot  Pain description: burning, aching, "pain"  Aggravating factors: standing (>15-20 min), walking, stairs, heavy lifting/carrying  Relieving factors: injection, heating pad   PRECAUTIONS: None  RED FLAGS: None   WEIGHT BEARING RESTRICTIONS: No  FALLS:  Has patient fallen in last 6 months? No  LIVING ENVIRONMENT: Lives with: lives with their family and lives alone Lives in: House/apartment Stairs: No Has following equipment at home: Single point cane, Environmental consultant - 2 wheeled, shower chair, and Grab bars  OCCUPATION: n/a   PLOF: Independent  PATIENT GOALS: "I hope that I can get out in the park to walk, and to keep up with my 3 y.o. grandbaby."   NEXT MD VISIT: TBD     OBJECTIVE: (objective measures completed at initial evaluation unless otherwise dated)   DIAGNOSTIC FINDINGS:  Had MRI lumbar spine 05/05/22 that showed:   IMPRESSION: 1. Mild multilevel degenerative changes of the lumbar spine, slightly progressed at the L4-5 level where there is mild-to-moderate right and mild left foraminal stenosis. 2. No significant canal stenosis at any level.  PATIENT SURVEYS:  FOTO 38 current, 53 predicted  02/02/23 FOTO: 40  COGNITION: Overall cognitive status: Within functional limits for tasks assessed     SENSATION: Not tested  PALPATION: Moderate pain with CPA from L3-S1, and moderate tenderness to palpation along Lt QL and lumbar paraspinals  LUMBAR ROM:   AROM eval 02/19/23  Flexion 80% Reported P! when returning to standing  90%, only limited by HS tightness   Extension 50% , increased p! 75%  Right lateral flexion 100% 100%  Left lateral flexion 100% 100%  Right rotation    Left rotation      (Blank rows = not tested)   LOWER EXTREMITY MMT:    MMT Right eval Left eval Right 02/19/23  Left 02/19/23  Hip flexion 4+ 4 4+ 4+  Hip extension      Hip abduction 4+ 4+ 4+ 4+  Hip adduction 5 5    Hip internal rotation      Hip external rotation      Knee flexion      Knee extension 5 5    Ankle dorsiflexion      Ankle plantarflexion      Ankle inversion      Ankle eversion        GAIT: Distance walked: 100 ft Assistive device utilized: None Level of assistance: Modified independence Comments: SPC, ambulates slowly with wide BOS, short step length     TODAY'S TREATMENT:    [facility] Adult PT Treatment:                                                DATE: 02/24/23 Therapeutic Exercise: Nu step 5 min UE/LE during subjective Blue band paloff 2x8 each direction cues for posture and breath control  STS from lowest mat, x8 BW; 2x5 w 5# Seated on dynadisc + fwd chest press 2#, 2x15 cues for posture and breath control Suitcase carry 10# 2x55ft bil cues for posture/pacing HEP update + education      [facility] Adult PT Treatment:                                                DATE: 02/19/2023  Therapeutic Activity:  Reassessment of objective measures and subjective assessment regarding progress towards established goals and plan for extended POC to address remaining deficits    [facility] Adult PT Treatment:                                                DATE: 02/17/2023  Therapeutic Exercise:  Suitcase carry 8# x149ft BIL cues for pacing Standing paloff press green band 2 x10 BIL  Standing hip abduction (from toe tap), 2 x10 each  Standing hip extension (from toe tap), 2 x 10 each  Dead bug modification with ball, LE only, x3 to fatigue Nu step 5 min warmup LE/UE L6  [facility] Adult PT Treatment:                                                DATE: 02/11/2023  Therapeutic Exercise:  STS 2 x 10 from raised mat Suitcase carry 8# x147ft BIL cues for pacing Standing paloff press green  band 2 x10 BIL  Sciatic nerve glide seated 2x10  Seated fwd chest press 2# seated on dynadisc, 2 x 15 Sidestepping at counter 4 laps  Hooklying ball squeeze, 2 x 10, 3 sec hold  Hooklying clamshells, 2 x 10, GTB Hooklying marches, 2 x 10 (5 each LE), GTB Hooklying bridges, 2 x 5, GTB Nu step 5 min warmup LE/UE L6   [facility] Adult PT Treatment:  DATE: 02/09/23 Therapeutic Exercise: Suitcase carry 8# 3x8ft BIL cues for pacing Sciatic nerve glides LLE only 2x8  Standing paloff press red band x10 BIL, green band x10 BIL  Sciatic nerve glide seated x10  STS x8 from raised mat Seated fwd chest press 2# x15 cues for posture Seated fwd chest press 2# seated on dynadisc Sidestepping at counter 2x1:30 HEP update + education    PATIENT EDUCATION:  Education details: rationale for interventions, HEP  Person educated: Patient Education method: Programmer, multimedia, Demonstration, and Handouts Education comprehension: verbalized understanding, returned demonstration, and needs further education  HOME EXERCISE PROGRAM: Access Code: [email or web address] URL: [email or web address] Date: 02/24/2023 Prepared by: Fransisco Hertz  Exercises - Seated Lumbar Flexion Stretch  - 2 x daily - 7 x weekly - 2 sets - 10 reps - 3 sec hold - Sit to Stand with Armchair  - 2 x daily - 7 x weekly - 1 sets - 8 reps - Standing Anti-Rotation Press with Anchored Resistance  - 2 x daily - 7 x weekly - 1 sets - 10 reps - Seated Sciatic Tensioner  - 2-3 x daily - 7 x weekly - 1 sets - 10 reps  ASSESSMENT:  CLINICAL IMPRESSION: Pt arrives w/o resting pain, continues to endorse steady improvement in functional tolerance, walking, and back/LE pain. Today continuing to progress core/LE strengthening program with addition of front carries which pt tolerates well, remains limited more so by exertional fatigue rather than pain. No adverse events, HEP update as above with increase in  volume/resistance where appropriate. Pt departs without pain. Recommend continuing along current POC in order to address relevant deficits and improve functional tolerance. Pt departs today's session in no acute distress, all voiced questions/concerns addressed appropriately from PT perspective.     OBJECTIVE IMPAIRMENTS: decreased activity tolerance, decreased endurance, decreased mobility, decreased strength, impaired perceived functional ability, impaired UE functional use, improper body mechanics, postural dysfunction, and pain.   ACTIVITY LIMITATIONS: carrying, lifting, bending, standing, squatting, stairs, transfers, and hygiene/grooming  PARTICIPATION LIMITATIONS: meal prep, cleaning, laundry, shopping, community activity, and occupation  PERSONAL FACTORS: Past/current experiences, Time since onset of injury/illness/exacerbation, and 3+ comorbidities: PMHx includes: Arthritis, Asthma, DM type II, HTN, Obesity, OSA, syncope, BIL TKR, essential HTN  are also affecting patient's functional outcome.   REHAB POTENTIAL: Fair    CLINICAL DECISION MAKING: Evolving/moderate complexity  EVALUATION COMPLEXITY: Moderate   GOALS: Goals reviewed with patient? Yes  SHORT TERM GOALS: Target date: 02/11/2023   Patient will be independent with initial home program for LS mobility and LQ strengthening.  Baseline: provided at eval  02/09/23: reports good adherence w/ HEP  Goal status: MET     LONG TERM GOALS: Target date: 03/21/2023, last updated 02/19/23   Patient will demonstrate improved LS AROM to at least 75% extension.   Baseline:  AROM eval 02/19/23  Flexion 80% Reported P! when returning to standing  90%, only limited by HS tightness   Extension 50% , increased p! 75%  Right lateral flexion 100% 100%  Left lateral flexion 100% 100%   Goal status: ONGOING  2.  Patient will demonstrate increased LE MMT to 5/5 bilaterally.  Baseline:  MMT Right eval Left eval Right 02/19/23  Left 02/19/23  Hip flexion 4+ 4 4+ 4+  Hip abduction 4+ 4+ 4+ 4+  Hip adduction 5 5    Knee extension 5 5     Goal status: ONGOING  3.  Patient will report improved overall functional ability with FOTO score of 53  or greater.   Baseline: 38 current 02/19/23: 58 Goal status: MET  4.  Patient will report improved standing and walking tolerance to 30 minutes or greater for regular walking program and performance of household tasks.  Baseline: 15-20 minutes  02/19/23: 20-25 minutes  Goal status: ONGOING  5.  Patient will demonstrate ability to ascend/descend stairs x 15 without exacerbation of symptoms.  Baseline: moderate difficulty  02/19/23: minimal difficulty; patient has a ramp at home, and reports that she doesn't need to use stairs at home.  Goal status: PARTIALLY MET   6.  Patient will demonstrate ability to perform floor to waist lifting of at least 20# using appropriate body mechanics and with no more than minimal pain in order to safely perform normal daily/occupational tasks.   Baseline: unable  02/19/23: able to lift her grandbaby if necessary, but unable to carry her.  Goal status: ONGOING  PLAN:  PT FREQUENCY: 1x/week, updated 02/19/23  PT DURATION: 4 weeks, updated 02/19/23  PLANNED INTERVENTIONS: Therapeutic exercises, Therapeutic activity, Neuromuscular re-education, Patient/Family education, Self Care, Joint mobilization, Aquatic Therapy, Dry Needling, Electrical stimulation, Cryotherapy, Moist heat, Taping, Traction, Manual therapy, and Re-evaluation.  PLAN FOR NEXT SESSION: continue to progress functional strengthening within pt tolerance and time spent in WB with reduced rest breaks as appropriate. Continue with centralization strategies PRN     Ashley Murrain PT, DPT 02/24/2023 3:53 PM

## 2023-03-02 ENCOUNTER — Ambulatory Visit (INDEPENDENT_AMBULATORY_CARE_PROVIDER_SITE_OTHER): Payer: Self-pay

## 2023-03-02 DIAGNOSIS — J309 Allergic rhinitis, unspecified: Secondary | ICD-10-CM

## 2023-03-02 NOTE — Therapy (Signed)
OUTPATIENT PHYSICAL THERAPY TREATMENT NOTE   Patient Name: Cheryl [name] MRN: [id number] DOB:[birth year], 67 y.o., female Today's Date: 03/03/2023  END OF SESSION:  PT End of Session - 03/03/23 1447     Visit Number 13    Number of Visits 15    Date for PT Re-Evaluation 03/21/23    Authorization Type MCR    PT Start Time 1448    PT Stop Time 1531    PT Time Calculation (min) 43 min    Activity Tolerance Patient tolerated treatment well;No increased pain    Behavior During Therapy WFL for tasks assessed/performed                 Past Medical History:  Diagnosis Date   Allergic rhinitis, cause unspecified    Arthritis    Asthma    Carpal tunnel syndrome    Cataract    Diabetes mellitus without complication (HCC)    diet- controlled   GERD (gastroesophageal reflux disease)    Glaucoma    Hypertension    Impaired fasting glucose    Morbid obesity (HCC)    OSA (obstructive sleep apnea) 12/02/2016   Other malaise and fatigue    Other specified cardiac dysrhythmias(427.89)    Pain in joint, site unspecified    Symptomatic menopausal or female climacteric states    Syncope and collapse    Past Surgical History:  Procedure Laterality Date   ABDOMINAL HYSTERECTOMY     1994   carpal tunnel both hands     2012   CLOSED MANIPULATION SHOULDER Left 04/2014   EYE SURGERY Left 08/25/2015   EYE SURGERY Right 09/24/2015   JOINT REPLACEMENT     both knees replacement, 2010   mole removed from face     Nodule removed from back     SHOULDER SURGERY Left 11/2013   TONSILLECTOMY     as teenager   Patient Active Problem List   Diagnosis Date Noted   Hyperlipidemia associated with type 2 diabetes mellitus (HCC) 01/25/2021   Type 2 diabetes mellitus with morbid obesity (HCC) 01/25/2021   Moderate persistent asthma, uncomplicated 11/04/2018   OSA (obstructive sleep apnea) 12/02/2016   Hyperlipidemia LDL goal <100 10/21/2014   Essential hypertension, benign 10/21/2014   DM  w/o complication type II (HCC) 09/13/2014   Routine general medical examination at a health care facility 10/12/2013   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 05/03/2013   Need for prophylactic vaccination and inoculation against influenza 02/08/2013   GERD (gastroesophageal reflux disease) 02/08/2013   Other and unspecified hyperlipidemia 11/10/2012   Type II or unspecified type diabetes mellitus without mention of complication, not stated as uncontrolled 10/25/2012   Impaired fasting glucose 09/07/2012   Morbid obesity (HCC) 09/07/2012   Generalized osteoarthrosis, involving multiple sites 09/07/2012   Carpal tunnel syndrome 09/07/2012   Other, multiple, and unspecified sites, insect bite, nonvenomous, without mention of infection(919.4) 09/07/2012   Special screening for malignant neoplasms, colon 09/07/2012   Other specified cardiac dysrhythmias(427.89) 09/07/2012   Candidiasis of vulva and vagina 09/07/2012   Abdominal pain, generalized 09/07/2012   Symptomatic menopausal or female climacteric states 09/07/2012   Hypertension associated with diabetes (HCC) 09/07/2012   Perennial allergic rhinitis 09/07/2012   Pain in joint 09/07/2012   Other malaise and fatigue 09/07/2012    PCP: Marcine Matar, MD  REFERRING PROVIDER: Marcine Matar, MD  REFERRING DIAG: Sciatica of left side associated with disorder of lumbar spine [M53.86]   Rationale  for Evaluation and Treatment: Rehabilitation  THERAPY DIAG:  Radiculopathy, lumbar region  ONSET DATE: 1+ year   SUBJECTIVE:                                                                                                                                                                                           SUBJECTIVE STATEMENT: No pain at present, reports daily HEP adherence and walking program (10-28min 1x/day). No issues after last session   PERTINENT HISTORY:  PMHx includes: Arthritis, Asthma, DM type II, HTN,  Obesity, OSA, syncope, BIL TKR, essential HTN  PAIN:  Are you having pain? No pain at present, worst 2/10 in past week Pain location: Lt LE to the top of Lt foot  Pain description: burning, aching, "pain"  Aggravating factors: standing (>15-20 min), walking, stairs, heavy lifting/carrying  Relieving factors: injection, heating pad   PRECAUTIONS: None  RED FLAGS: None   WEIGHT BEARING RESTRICTIONS: No  FALLS:  Has patient fallen in last 6 months? No  LIVING ENVIRONMENT: Lives with: lives with their family and lives alone Lives in: House/apartment Stairs: No Has following equipment at home: Single point cane, Environmental consultant - 2 wheeled, shower chair, and Grab bars  OCCUPATION: n/a   PLOF: Independent  PATIENT GOALS: "I hope that I can get out in the park to walk, and to keep up with my 3 y.o. grandbaby."   NEXT MD VISIT: TBD     OBJECTIVE: (objective measures completed at initial evaluation unless otherwise dated)   DIAGNOSTIC FINDINGS:  Had MRI lumbar spine 05/05/22 that showed:   IMPRESSION: 1. Mild multilevel degenerative changes of the lumbar spine, slightly progressed at the L4-5 level where there is mild-to-moderate right and mild left foraminal stenosis. 2. No significant canal stenosis at any level.  PATIENT SURVEYS:  FOTO 38 current, 53 predicted  02/02/23 FOTO: 40  COGNITION: Overall cognitive status: Within functional limits for tasks assessed     SENSATION: Not tested  PALPATION: Moderate pain with CPA from L3-S1, and moderate tenderness to palpation along Lt QL and lumbar paraspinals  LUMBAR ROM:   AROM eval 02/19/23  Flexion 80% Reported P! when returning to standing  90%, only limited by HS tightness   Extension 50% , increased p! 75%  Right lateral flexion 100% 100%  Left lateral flexion 100% 100%  Right rotation    Left rotation     (Blank rows = not tested)   LOWER EXTREMITY MMT:    MMT Right eval Left eval Right 02/19/23 Left 02/19/23   Hip flexion 4+ 4 4+ 4+  Hip extension      Hip abduction 4+  4+ 4+ 4+  Hip adduction 5 5    Hip internal rotation      Hip external rotation      Knee flexion      Knee extension 5 5    Ankle dorsiflexion      Ankle plantarflexion      Ankle inversion      Ankle eversion        GAIT: Distance walked: 100 ft Assistive device utilized: None Level of assistance: Modified independence Comments: SPC, ambulates slowly with wide BOS, short step length     TODAY'S TREATMENT:    [facility] Adult PT Treatment:                                                DATE: 03/03/23 Therapeutic Exercise: Nu step 5 min LE/UE Blue paloff press 2x12 BIL cues for breath control  Green band shoulder extension 2x8 standing on airex with posterior weight shift cues for posture 5# STS lowest mat x8, 10# x5 cues for pacing and breath control Suitcase carry10# 2x87ft Seated on dynadisc fwd chest press 3# 2x10 cues for breath control HEP education/review, provided w/ blue band to progress paloff press    [facility] Adult PT Treatment:                                                DATE: 02/24/23 Therapeutic Exercise: Nu step 5 min UE/LE during subjective Blue band paloff 2x8 each direction cues for posture and breath control  STS from lowest mat, x8 BW; 2x5 w 5# Seated on dynadisc + fwd chest press 2#, 2x15 cues for posture and breath control Suitcase carry 10# 2x47ft bil cues for posture/pacing HEP update + education      [facility] Adult PT Treatment:                                                DATE: 02/19/2023  Therapeutic Activity:  Reassessment of objective measures and subjective assessment regarding progress towards established goals and plan for extended POC to address remaining deficits    [facility] Adult PT Treatment:                                                DATE: 02/17/2023  Therapeutic Exercise:  Suitcase carry 8# x173ft BIL cues for pacing Standing paloff press green band 2 x10 BIL  Standing hip  abduction (from toe tap), 2 x10 each  Standing hip extension (from toe tap), 2 x 10 each  Dead bug modification with ball, LE only, x3 to fatigue Nu step 5 min warmup LE/UE L6  [facility] Adult PT Treatment:                                                DATE: 02/11/2023  Therapeutic Exercise:  STS 2  x 10 from raised mat Suitcase carry 8# x15ft BIL cues for pacing Standing paloff press green band 2 x10 BIL  Sciatic nerve glide seated 2x10  Seated fwd chest press 2# seated on dynadisc, 2 x 15 Sidestepping at counter 4 laps  Hooklying ball squeeze, 2 x 10, 3 sec hold  Hooklying clamshells, 2 x 10, GTB Hooklying marches, 2 x 10 (5 each LE), GTB Hooklying bridges, 2 x 5, GTB Nu step 5 min warmup LE/UE L6   [facility] Adult PT Treatment:                                                DATE: 02/09/23 Therapeutic Exercise: Suitcase carry 8# 3x47ft BIL cues for pacing Sciatic nerve glides LLE only 2x8  Standing paloff press red band x10 BIL, green band x10 BIL  Sciatic nerve glide seated x10  STS x8 from raised mat Seated fwd chest press 2# x15 cues for posture Seated fwd chest press 2# seated on dynadisc Sidestepping at counter 2x1:30 HEP update + education    PATIENT EDUCATION:  Education details: rationale for interventions, HEP  Person educated: Patient Education method: Explanation, Demonstration, and Handouts Education comprehension: verbalized understanding, returned demonstration, and needs further education  HOME EXERCISE PROGRAM: Access Code: [email or web address] URL: [email or web address] Date: 02/24/2023 Prepared by: Fransisco Hertz  Exercises - Seated Lumbar Flexion Stretch  - 2 x daily - 7 x weekly - 2 sets - 10 reps - 3 sec hold - Sit to Stand with Armchair  - 2 x daily - 7 x weekly - 1 sets - 8 reps - Standing Anti-Rotation Press with Anchored Resistance  - 2 x daily - 7 x weekly - 1 sets - 10 reps - Seated Sciatic Tensioner  - 2-3 x daily - 7 x weekly - 1 sets - 10  reps  ASSESSMENT:  CLINICAL IMPRESSION: Pt arrives w/o resting pain, continuing to report steady progress. Continuing to expand volume with familiar exercises and resistance where able with functional strengthening. Rest breaks to mitigate exertional fatigue, no increase in resting pain on departure, no adverse events. Cues as above. If continues along current trajectory, pt may be appropriate for discharge in coming visits given increased independence with exercise and improved activity tolerance. Recommend continuing along current POC in order to address relevant deficits and improve functional tolerance. Pt departs today's session in no acute distress, all voiced questions/concerns addressed appropriately from PT perspective.     OBJECTIVE IMPAIRMENTS: decreased activity tolerance, decreased endurance, decreased mobility, decreased strength, impaired perceived functional ability, impaired UE functional use, improper body mechanics, postural dysfunction, and pain.   ACTIVITY LIMITATIONS: carrying, lifting, bending, standing, squatting, stairs, transfers, and hygiene/grooming  PARTICIPATION LIMITATIONS: meal prep, cleaning, laundry, shopping, community activity, and occupation  PERSONAL FACTORS: Past/current experiences, Time since onset of injury/illness/exacerbation, and 3+ comorbidities: PMHx includes: Arthritis, Asthma, DM type II, HTN, Obesity, OSA, syncope, BIL TKR, essential HTN  are also affecting patient's functional outcome.   REHAB POTENTIAL: Fair    CLINICAL DECISION MAKING: Evolving/moderate complexity  EVALUATION COMPLEXITY: Moderate   GOALS: Goals reviewed with patient? Yes  SHORT TERM GOALS: Target date: 02/11/2023   Patient will be independent with initial home program for LS mobility and LQ strengthening.  Baseline: provided at eval  02/09/23: reports good adherence w/ HEP  Goal status: MET     LONG  TERM GOALS: Target date: 03/21/2023, last updated  02/19/23   Patient will demonstrate improved LS AROM to at least 75% extension.   Baseline:  AROM eval 02/19/23  Flexion 80% Reported P! when returning to standing  90%, only limited by HS tightness   Extension 50% , increased p! 75%  Right lateral flexion 100% 100%  Left lateral flexion 100% 100%   Goal status: ONGOING  2.  Patient will demonstrate increased LE MMT to 5/5 bilaterally.  Baseline:  MMT Right eval Left eval Right 02/19/23 Left 02/19/23  Hip flexion 4+ 4 4+ 4+  Hip abduction 4+ 4+ 4+ 4+  Hip adduction 5 5    Knee extension 5 5     Goal status: ONGOING  3.  Patient will report improved overall functional ability with FOTO score of 53 or greater.   Baseline: 38 current 02/19/23: 58 Goal status: MET  4.  Patient will report improved standing and walking tolerance to 30 minutes or greater for regular walking program and performance of household tasks.  Baseline: 15-20 minutes  02/19/23: 20-25 minutes  Goal status: ONGOING  5.  Patient will demonstrate ability to ascend/descend stairs x 15 without exacerbation of symptoms.  Baseline: moderate difficulty  02/19/23: minimal difficulty; patient has a ramp at home, and reports that she doesn't need to use stairs at home.  Goal status: PARTIALLY MET   6.  Patient will demonstrate ability to perform floor to waist lifting of at least 20# using appropriate body mechanics and with no more than minimal pain in order to safely perform normal daily/occupational tasks.   Baseline: unable  02/19/23: able to lift her grandbaby if necessary, but unable to carry her.  Goal status: ONGOING  PLAN:  PT FREQUENCY: 1x/week, updated 02/19/23  PT DURATION: 4 weeks, updated 02/19/23  PLANNED INTERVENTIONS: Therapeutic exercises, Therapeutic activity, Neuromuscular re-education, Patient/Family education, Self Care, Joint mobilization, Aquatic Therapy, Dry Needling, Electrical stimulation, Cryotherapy, Moist heat, Taping, Traction, Manual  therapy, and Re-evaluation.  PLAN FOR NEXT SESSION: continue to progress functional strengthening within pt tolerance and time spent in WB with reduced rest breaks as appropriate. Continue with centralization strategies PRN     Ashley Murrain PT, DPT 03/03/2023 3:52 PM

## 2023-03-03 ENCOUNTER — Encounter: Payer: Self-pay | Admitting: Physical Therapy

## 2023-03-03 ENCOUNTER — Ambulatory Visit: Payer: Medicare Other | Admitting: Physical Therapy

## 2023-03-03 DIAGNOSIS — M5416 Radiculopathy, lumbar region: Secondary | ICD-10-CM

## 2023-03-04 ENCOUNTER — Ambulatory Visit: Payer: Medicare Other | Attending: Internal Medicine

## 2023-03-10 ENCOUNTER — Ambulatory Visit: Payer: Medicare Other | Attending: Internal Medicine

## 2023-03-10 VITALS — Ht 69.0 in | Wt 275.0 lb

## 2023-03-10 DIAGNOSIS — Z0000 Encounter for general adult medical examination without abnormal findings: Secondary | ICD-10-CM | POA: Diagnosis not present

## 2023-03-10 NOTE — Progress Notes (Signed)
Subjective:   Cheryl [name] is a 67 y.o. female who presents for Medicare Annual (Subsequent) preventive examination.  Visit Complete: Virtual I connected with  Rayyan C Matheney on 03/10/23 by a audio enabled telemedicine application and verified that I am speaking with the correct person using two identifiers.  Patient Location: Home  Provider Location: Home Office  I discussed the limitations of evaluation and management by telemedicine. The patient expressed understanding and agreed to proceed.  Vital Signs: Because this visit was a virtual/telehealth visit, some criteria may be missing or patient reported. Any vitals not documented were not able to be obtained and vitals that have been documented are patient reported.  Cardiac Risk Factors include: advanced age (>14men, >63 women);diabetes mellitus;dyslipidemia;hypertension     Objective:    Today's Vitals   03/10/23 2224  Weight: 275 lb (124.7 kg)  Height: 5\' 9"  (1.753 m)   Body mass index is 40.61 kg/m.     03/10/2023   10:46 PM 01/14/2023    2:20 PM 01/21/2022    3:51 PM 01/31/2021    5:00 PM 12/28/2018   10:08 AM 11/17/2018    1:19 PM 08/17/2018    9:13 AM  Advanced Directives  Does Patient Have a Medical Advance Directive? Yes Yes Yes Yes Yes Yes Yes  Type of Estate agent of [place];Living will Living will;Healthcare Power of [employer] Power of [employer] Power of University;Living will Healthcare Power of Attorney  Does patient want to make changes to medical advance directive? No - Patient declined  No - Patient declined No - Patient declined  No - Patient declined No - Patient declined  Copy of Healthcare Power of Attorney in Chart? Yes - validated most recent copy scanned in chart (See row information)  No - copy requested   Yes - validated most recent copy scanned in chart (See row information) Yes - validated most recent copy scanned in chart (See row information)    Current  Medications (verified) Outpatient Encounter Medications as of 03/10/2023  Medication Sig   acetaminophen (TYLENOL) 500 MG tablet Take 500 mg by mouth daily.   albuterol (VENTOLIN HFA) 108 (90 Base) MCG/ACT inhaler INHALE 2 PUFFS INTO THE LUNGS UP TO EVERY 6HRS AS NEEDED FOR WHEEZING/SHORTNESS OF BREATH   AMBULATORY NON FORMULARY MEDICATION Medication Name: Allergy Injection- every 4 weeks  "I take it once a week" 01/14/23   aspirin 81 MG tablet Take 81 mg by mouth daily.   azelastine (ASTELIN) 0.1 % nasal spray Place 2 sprays into both nostrils 2 (two) times daily. Use in each nostril as directed   B-D ULTRA-FINE 33 LANCETS MISC Use to test blood sugar once daily. Dx: E11.9   budesonide-formoterol (SYMBICORT) 160-4.5 MCG/ACT inhaler TAKE 2 PUFFS BY MOUTH TWICE A DAY   Calcium Carbonate-Vitamin D 600-400 MG-UNIT chew tablet Chew 1 tablet by mouth daily.    EPINEPHRINE 0.3 mg/0.3 mL IJ SOAJ injection INJECT 0.3 MG INTO THE MUSCLE AS NEEDED FOR ANAPHYLAXIS.   gabapentin (NEURONTIN) 300 MG capsule Take 1 capsule (300 mg total) by mouth at bedtime.   levocetirizine (XYZAL) 5 MG tablet TAKE 1 TABLET BY MOUTH EVERY DAY IN THE EVENING   lisinopril (ZESTRIL) 10 MG tablet Take 1 tablet (10 mg total) by mouth daily.   Multiple Vitamin (MULTIVITAMIN ADULT PO) 1 tablet   nabumetone (RELAFEN) 500 MG tablet Take 500 mg by mouth daily. PRN   omeprazole (PRILOSEC) 40 MG capsule TAKE 1 CAPSULE (  40 MG TOTAL) BY MOUTH DAILY.   Respiratory Therapy Supplies (CARETOUCH 2 CPAP HOSE HANGER) MISC    simvastatin (ZOCOR) 20 MG tablet TAKE 1 TABLET (20 MG) BY ORAL ROUTE ONCE DAILY IN THE EVENING FOR 90 DAYS   Spacer/Aero-Holding Chambers DEVI 1 each by Does not apply route as needed.   triamterene-hydrochlorothiazide (MAXZIDE) 75-50 MG tablet TAKE 1 TABLET BY MOUTH EVERY DAY   vitamin C (ASCORBIC ACID) 500 MG tablet Take 500 mg by mouth every other day.   fluticasone (FLONASE) 50 MCG/ACT nasal spray Place 1-2 sprays into  both nostrils daily. (Patient not taking: Reported on 02/23/2023)   No facility-administered encounter medications on file as of 03/10/2023.    Allergies (verified) Misc. sulfonamide containing compounds, Elemental sulfur, and Oxycodone   History: Past Medical History:  Diagnosis Date   Allergic rhinitis, cause unspecified    Arthritis    Asthma    Carpal tunnel syndrome    Cataract    Diabetes mellitus without complication (HCC)    diet- controlled   GERD (gastroesophageal reflux disease)    Glaucoma    Hypertension    Impaired fasting glucose    Morbid obesity (HCC)    OSA (obstructive sleep apnea) 12/02/2016   Other malaise and fatigue    Other specified cardiac dysrhythmias(427.89)    Pain in joint, site unspecified    Symptomatic menopausal or female climacteric states    Syncope and collapse    Past Surgical History:  Procedure Laterality Date   ABDOMINAL HYSTERECTOMY     1994   carpal tunnel both hands     2012   CLOSED MANIPULATION SHOULDER Left 04/2014   EYE SURGERY Left 08/25/2015   EYE SURGERY Right 09/24/2015   JOINT REPLACEMENT     both knees replacement, 2010   mole removed from face     Nodule removed from back     SHOULDER SURGERY Left 11/2013   TONSILLECTOMY     as teenager   Family History  Problem Relation Age of Onset   Cancer Father    Diabetes Father    Allergic rhinitis Son    Diabetes Brother    Allergic rhinitis Son    Diabetes Paternal Aunt    Lung cancer Cousin    Asthma Neg Hx    Social History   Socioeconomic History   Marital status: Divorced    Spouse name: Not on file   Number of children: Not on file   Years of education: Not on file   Highest education level: Not on file  Occupational History   Not on file  Tobacco Use   Smoking status: Never   Smokeless tobacco: Never  Vaping Use   Vaping status: Never Used  Substance and Sexual Activity   Alcohol use: Yes    Comment: Seldom- Wine    Drug use: No   Sexual  activity: Not Currently    Comment: post office worker  Other Topics Concern   Not on file  Social History Narrative   Not on file   Social Determinants of Health   Financial Resource Strain: Low Risk  (03/10/2023)   Overall Financial Resource Strain (CARDIA)    Difficulty of Paying Living Expenses: Not hard at all  Food Insecurity: No Food Insecurity (03/10/2023)   Hunger Vital Sign    Worried About Running Out of Food in the Last Year: Never true    Ran Out of Food in the Last Year: Never true  Transportation  Needs: No Transportation Needs (03/10/2023)   PRAPARE - Administrator, Civil Service (Medical): No    Lack of Transportation (Non-Medical): No  Physical Activity: Insufficiently Active (03/10/2023)   Exercise Vital Sign    Days of Exercise per Week: 3 days    Minutes of Exercise per Session: 30 min  Stress: No Stress Concern Present (03/10/2023)   [employer] of Occupational Health - Occupational Stress Questionnaire    Feeling of Stress : Only a little  Social Connections: Socially Isolated (03/10/2023)   Social Connection and Isolation Panel [NHANES]    Frequency of Communication with Friends and Family: More than three times a week    Frequency of Social Gatherings with Friends and Family: Never    Attends Religious Services: Never    Database administrator or Organizations: No    Attends Engineer, structural: Never    Marital Status: Never married    Tobacco Counseling Counseling given: Not Answered   Clinical Intake:  Pre-visit preparation completed: Yes  Pain : No/denies pain     Diabetes: Yes CBG done?: No Did pt. bring in CBG monitor from home?: No  How often do you need to have someone help you when you read instructions, pamphlets, or other written materials from your doctor or pharmacy?: 1 - Never  Interpreter Needed?: No  Information entered by :: Kandis Fantasia LPN   Activities of Daily Living    03/10/2023    10:45 PM  In your present state of health, do you have any difficulty performing the following activities:  Hearing? 0  Vision? 0  Difficulty concentrating or making decisions? 0  Walking or climbing stairs? 0  Dressing or bathing? 0  Doing errands, shopping? 0  Preparing Food and eating ? N  Using the Toilet? N  In the past six months, have you accidently leaked urine? N  Do you have problems with loss of bowel control? N  Managing your Medications? N  Managing your Finances? N  Housekeeping or managing your Housekeeping? N    Patient Care Team: Marcine Matar, MD as PCP - General (Internal Medicine) Corky Crafts, MD as PCP - Cardiology (Cardiology) Edd Fabian, [place] (Optometry) Daisy Lazar, MD as Consulting Physician (Ophthalmology) Dellis Anes Hetty Ely, MD as Consulting Physician (Allergy and Immunology) Ob/Gyn, [facility] (Obstetrics and Gynecology) Jaymes Graff, MD as Consulting Physician (Obstetrics and Gynecology) Waymon Budge, MD as Consulting Physician (Pulmonary Disease)  Indicate any recent Medical Services you may have received from other than [facility] providers in the past year (date may be approximate).     Assessment:   This is a routine wellness examination for Cheryl [name].  Hearing/Vision screen Hearing Screening - Comments:: Denies hearing difficulties   Vision Screening - Comments:: Wears rx glasses - up to date with routine eye exams with [facility].     Goals Addressed   None   Depression Screen    03/10/2023   10:42 PM 01/01/2023    9:22 AM 10/03/2022    2:54 PM 05/30/2022    2:03 PM 01/28/2022    3:24 PM 01/21/2022    3:54 PM 12/12/2021    9:54 AM  PHQ 2/9 Scores  PHQ - 2 Score 2 1 3 4 2  0 2  PHQ- 9 Score 6 6 7 9 6  9     Fall Risk    03/10/2023   10:45 PM 01/01/2023    9:22 AM 10/03/2022    2:19 PM 05/30/2022  1:56 PM 01/28/2022    3:24 PM  Fall Risk   Falls in the past year? 0 0 0 1 0  Number falls in past yr: 0 0 0 1 0   Injury with Fall? 0 0 0 0 0  Risk for fall due to : No Fall Risks No Fall Risks No Fall Risks History of fall(s)   Follow up Falls prevention discussed;Education provided;Falls evaluation completed        MEDICARE RISK AT HOME: Medicare Risk at Home Any stairs in or around the home?: No If so, are there any without handrails?: No Home free of loose throw rugs in walkways, pet beds, electrical cords, etc?: Yes Adequate lighting in your home to reduce risk of falls?: Yes Life alert?: No Use of a cane, walker or w/c?: No Grab bars in the bathroom?: Yes Shower chair or bench in shower?: No Elevated toilet seat or a handicapped toilet?: Yes  TIMED UP AND GO:  Was the test performed?  No    Cognitive Function:    01/21/2022    3:56 PM  MMSE - Mini Mental State Exam  Orientation to time 5  Orientation to Place 5  Registration 3  Attention/ Calculation 5  Recall 2  Language- name 2 objects 2  Language- repeat 1  Language- follow 3 step command 3  Language- read & follow direction 1  Write a sentence 1  Copy design 1  Total score 29        03/10/2023   10:45 PM 11/17/2018    1:20 PM  6CIT Screen  What Year? 0 points 0 points  What month? 0 points 0 points  What time? 0 points 0 points  Count back from 20 0 points 0 points  Months in reverse 0 points 0 points  Repeat phrase 0 points 2 points  Total Score 0 points 2 points    Immunizations Immunization History  Administered Date(s) Administered   Influenza Split 02/07/2017   Influenza,inj,Quad PF,6+ Mos 02/08/2013, 02/23/2015, 02/29/2016, 01/25/2021, 01/28/2022   Influenza-Unspecified 01/24/2014, 02/12/2018, 02/07/2019, 01/23/2023   PFIZER(Purple Top)SARS-COV-2 Vaccination 07/02/2019, 07/26/2019, 02/24/2020, 09/28/2020, 09/23/2021   Pfizer(Comirnaty)Fall Seasonal Vaccine 12 years and older 01/23/2023   Pneumococcal Conjugate (Pcv15) 05/28/2021   Pneumococcal Polysaccharide-23 05/03/2013   Rsv, Mab,  Judi Cong, 1 Ml, Neonate To 24 Mos(Beyfortus) 01/29/2022   Td 08/04/2011   Tdap 09/25/2021   Zoster Recombinant(Shingrix) 12/21/2016, 06/30/2017   Zoster, Live 05/26/2014, 02/23/2017    TDAP status: Up to date  Flu Vaccine status: Up to date  Pneumococcal vaccine status: Up to date  Covid-19 vaccine status: Information provided on how to obtain vaccines.   Qualifies for Shingles Vaccine? Yes   Zostavax completed No   Shingrix Completed?: Yes  Screening Tests Health Maintenance  Topic Date Due   COVID-19 Vaccine (7 - 2023-24 season) 03/20/2023   OPHTHALMOLOGY EXAM  03/21/2023   HEMOGLOBIN A1C  08/03/2023   FOOT EXAM  10/03/2023   Diabetic kidney evaluation - eGFR measurement  01/01/2024   Diabetic kidney evaluation - Urine ACR  02/03/2024   Medicare Annual Wellness (AWV)  03/09/2024   MAMMOGRAM  12/29/2024   Colonoscopy  06/26/2030   DTaP/Tdap/Td (3 - Td or Tdap) 09/26/2031   Pneumonia Vaccine 74+ Years old  Completed   INFLUENZA VACCINE  Completed   DEXA SCAN  Completed   Zoster Vaccines- Shingrix  Completed   HPV VACCINES  Aged Out   Hepatitis C Screening  Discontinued    Health Maintenance  There are no preventive care reminders to display for this patient.   Colorectal cancer screening: Type of screening: Colonoscopy. Completed 06/26/20. Repeat every 10 years  Mammogram status: Completed 12/30/22. Repeat every year  Bone Density status: Completed 07/11/22. Results reflect: Bone density results: OSTEOPENIA. Repeat every 2 years.  Lung Cancer Screening: (Low Dose CT Chest recommended if Age 42-80 years, 20 pack-year currently smoking OR have quit w/in 15years.) does not qualify.   Lung Cancer Screening Referral: n/a  Additional Screening:  Hepatitis C Screening: does qualify;   Vision Screening: Recommended annual ophthalmology exams for early detection of glaucoma and other disorders of the eye. Is the patient up to date with their annual eye exam?  Yes   Who is the provider or what is the name of the office in which the patient attends annual eye exams? [facility] [place])  If pt is not established with a provider, would they like to be referred to a provider to establish care? No .   Dental Screening: Recommended annual dental exams for proper oral hygiene  Diabetic Foot Exam: Diabetic Foot Exam: Overdue, Pt has been advised about the importance in completing this exam. Pt is scheduled for diabetic foot exam on at next office visit .  Community Resource Referral / Chronic Care Management: CRR required this visit?  No   CCM required this visit?  No     Plan:     I have personally reviewed and noted the following in the patient's chart:   Medical and social history Use of alcohol, tobacco or illicit drugs  Current medications and supplements including opioid prescriptions. Patient is not currently taking opioid prescriptions. Functional ability and status Nutritional status Physical activity Advanced directives List of other physicians Hospitalizations, surgeries, and ER visits in previous 12 months Vitals Screenings to include cognitive, depression, and falls Referrals and appointments  In addition, I have reviewed and discussed with patient certain preventive protocols, quality metrics, and best practice recommendations. A written personalized care plan for preventive services as well as general preventive health recommendations were provided to patient.     Kandis Fantasia [place], [place]   [id number]/02/[id number]   After Visit Summary: (MyChart) Due to this being a telephonic visit, the after visit summary with patients personalized plan was offered to patient via MyChart   Nurse Notes: No concerns at this time

## 2023-03-10 NOTE — Patient Instructions (Signed)
Ms. Cincotta , Thank you for taking time to come for your Medicare Wellness Visit. I appreciate your ongoing commitment to your health goals. Please review the following plan we discussed and let me know if I can assist you in the future.   Referrals/Orders/Follow-Ups/Clinician Recommendations: Aim for 30 minutes of exercise or brisk walking, 6-8 glasses of water, and 5 servings of fruits and vegetables each day.  This is a list of the screening recommended for you and due dates:  Health Maintenance  Topic Date Due   COVID-19 Vaccine (7 - 2023-24 season) 03/20/2023   Eye exam for diabetics  03/21/2023   Hemoglobin A1C  08/03/2023   Complete foot exam   10/03/2023   Yearly kidney function blood test for diabetes  01/01/2024   Yearly kidney health urinalysis for diabetes  02/03/2024   Medicare Annual Wellness Visit  03/09/2024   Mammogram  12/29/2024   Colon Cancer Screening  06/26/2030   DTaP/Tdap/Td vaccine (3 - Td or Tdap) 09/26/2031   Pneumonia Vaccine  Completed   Flu Shot  Completed   DEXA scan (bone density measurement)  Completed   Zoster (Shingles) Vaccine  Completed   HPV Vaccine  Aged Out   Hepatitis C Screening  Discontinued    Advanced directives: (In Chart) A copy of your advanced directives are scanned into your chart should your provider ever need it.  Next Medicare Annual Wellness Visit scheduled for next year: Yes

## 2023-03-11 ENCOUNTER — Ambulatory Visit: Payer: Medicare Other

## 2023-03-11 ENCOUNTER — Ambulatory Visit (INDEPENDENT_AMBULATORY_CARE_PROVIDER_SITE_OTHER): Payer: Self-pay | Admitting: *Deleted

## 2023-03-11 DIAGNOSIS — M5416 Radiculopathy, lumbar region: Secondary | ICD-10-CM | POA: Diagnosis not present

## 2023-03-11 DIAGNOSIS — J309 Allergic rhinitis, unspecified: Secondary | ICD-10-CM

## 2023-03-11 NOTE — Therapy (Signed)
OUTPATIENT PHYSICAL THERAPY TREATMENT NOTE   Patient Name: Cheryl [name] MRN: [id number] DOB:May 11, 1956, 67 y.o., female Today's Date: 03/11/2023  END OF SESSION:  PT End of Session - 03/11/23 1400     Visit Number 14    Number of Visits 15    Date for PT Re-Evaluation 03/21/23    Authorization Type MCR    Progress Note Due on Visit 10    PT Start Time 1400    PT Stop Time 1440    PT Time Calculation (min) 40 min    Activity Tolerance Patient tolerated treatment well;No increased pain    Behavior During Therapy WFL for tasks assessed/performed                  Past Medical History:  Diagnosis Date   Allergic rhinitis, cause unspecified    Arthritis    Asthma    Carpal tunnel syndrome    Cataract    Diabetes mellitus without complication (HCC)    diet- controlled   GERD (gastroesophageal reflux disease)    Glaucoma    Hypertension    Impaired fasting glucose    Morbid obesity (HCC)    OSA (obstructive sleep apnea) 12/02/2016   Other malaise and fatigue    Other specified cardiac dysrhythmias(427.89)    Pain in joint, site unspecified    Symptomatic menopausal or female climacteric states    Syncope and collapse    Past Surgical History:  Procedure Laterality Date   ABDOMINAL HYSTERECTOMY     1994   carpal tunnel both hands     2012   CLOSED MANIPULATION SHOULDER Left 04/2014   EYE SURGERY Left 08/25/2015   EYE SURGERY Right 09/24/2015   JOINT REPLACEMENT     both knees replacement, 2010   mole removed from face     Nodule removed from back     SHOULDER SURGERY Left 11/2013   TONSILLECTOMY     as teenager   Patient Active Problem List   Diagnosis Date Noted   Hyperlipidemia associated with type 2 diabetes mellitus (HCC) 01/25/2021   Type 2 diabetes mellitus with morbid obesity (HCC) 01/25/2021   Moderate persistent asthma, uncomplicated 11/04/2018   OSA (obstructive sleep apnea) 12/02/2016   Hyperlipidemia LDL goal <100 10/21/2014   Essential  hypertension, benign 10/21/2014   DM w/o complication type II (HCC) 09/13/2014   Routine general medical examination at a health care facility 10/12/2013   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 05/03/2013   Need for prophylactic vaccination and inoculation against influenza 02/08/2013   GERD (gastroesophageal reflux disease) 02/08/2013   Other and unspecified hyperlipidemia 11/10/2012   Type II or unspecified type diabetes mellitus without mention of complication, not stated as uncontrolled 10/25/2012   Impaired fasting glucose 09/07/2012   Morbid obesity (HCC) 09/07/2012   Generalized osteoarthrosis, involving multiple sites 09/07/2012   Carpal tunnel syndrome 09/07/2012   Other, multiple, and unspecified sites, insect bite, nonvenomous, without mention of infection(919.4) 09/07/2012   Special screening for malignant neoplasms, colon 09/07/2012   Other specified cardiac dysrhythmias(427.89) 09/07/2012   Candidiasis of vulva and vagina 09/07/2012   Abdominal pain, generalized 09/07/2012   Symptomatic menopausal or female climacteric states 09/07/2012   Hypertension associated with diabetes (HCC) 09/07/2012   Perennial allergic rhinitis 09/07/2012   Pain in joint 09/07/2012   Other malaise and fatigue 09/07/2012    PCP: Marcine Matar, MD  REFERRING PROVIDER: Marcine Matar, MD  REFERRING DIAG: Sciatica of left side  associated with disorder of lumbar spine [M53.86]   Rationale for Evaluation and Treatment: Rehabilitation  THERAPY DIAG:  Radiculopathy, lumbar region  ONSET DATE: 1+ year   SUBJECTIVE:                                                                                                                                                                                           SUBJECTIVE STATEMENT: Patient reports that she has not been walking as regularly, to which she attributes her increased symptoms.     PERTINENT HISTORY:  PMHx includes:  Arthritis, Asthma, DM type II, HTN, Obesity, OSA, syncope, BIL TKR, essential HTN  PAIN:  Are you having pain? No pain at present, worst 2/10 in past week Pain location: Lt LE to the top of Lt foot  Pain description: burning, aching, "pain"  Aggravating factors: standing (>15-20 min), walking, stairs, heavy lifting/carrying  Relieving factors: injection, heating pad   PRECAUTIONS: None  RED FLAGS: None   WEIGHT BEARING RESTRICTIONS: No  FALLS:  Has patient fallen in last 6 months? No  LIVING ENVIRONMENT: Lives with: lives with their family and lives alone Lives in: House/apartment Stairs: No Has following equipment at home: Single point cane, Environmental consultant - 2 wheeled, shower chair, and Grab bars  OCCUPATION: n/a   PLOF: Independent  PATIENT GOALS: "I hope that I can get out in the park to walk, and to keep up with my 3 y.o. grandbaby."   NEXT MD VISIT: TBD     OBJECTIVE: (objective measures completed at initial evaluation unless otherwise dated)   DIAGNOSTIC FINDINGS:  Had MRI lumbar spine 05/05/22 that showed:   IMPRESSION: 1. Mild multilevel degenerative changes of the lumbar spine, slightly progressed at the L4-5 level where there is mild-to-moderate right and mild left foraminal stenosis. 2. No significant canal stenosis at any level.  PATIENT SURVEYS:  FOTO 38 current, 53 predicted  02/02/23 FOTO: 40  COGNITION: Overall cognitive status: Within functional limits for tasks assessed     SENSATION: Not tested  PALPATION: Moderate pain with CPA from L3-S1, and moderate tenderness to palpation along Lt QL and lumbar paraspinals  LUMBAR ROM:   AROM eval 02/19/23  Flexion 80% Reported P! when returning to standing  90%, only limited by HS tightness   Extension 50% , increased p! 75%  Right lateral flexion 100% 100%  Left lateral flexion 100% 100%  Right rotation    Left rotation     (Blank rows = not tested)   LOWER EXTREMITY MMT:    MMT Right eval  Left eval Right 02/19/23 Left 02/19/23  Hip flexion 4+ 4 4+ 4+  Hip extension      Hip abduction 4+ 4+ 4+ 4+  Hip adduction 5 5    Hip internal rotation      Hip external rotation      Knee flexion      Knee extension 5 5    Ankle dorsiflexion      Ankle plantarflexion      Ankle inversion      Ankle eversion        GAIT: Distance walked: 100 ft Assistive device utilized: None Level of assistance: Modified independence Comments: SPC, ambulates slowly with wide BOS, short step length     TODAY'S TREATMENT:      [facility] Adult PT Treatment:                                                DATE: 03/11/2023  Therapeutic Exercise: Nu step 5 min LE/UE Blue paloff press 2x10 BIL cues for breath control  Green band shoulder extension 2x15 standing on airex with posterior weight shift cues for posture STS 10# 2 x 5  Suitcase carry 10# 2x7ft Hurdle step overs fwd (4) x 2 laps Hurdle step overs lat (4) x 2 laps    [facility] Adult PT Treatment:                                                DATE: 03/03/23 Therapeutic Exercise: Nu step 5 min LE/UE Blue paloff press 2x12 BIL cues for breath control  Green band shoulder extension 2x8 standing on airex with posterior weight shift cues for posture 5# STS lowest mat x8, 10# x5 cues for pacing and breath control Suitcase carry10# 2x23ft Seated on dynadisc fwd chest press 3# 2x10 cues for breath control HEP education/review, provided w/ blue band to progress paloff press    [facility] Adult PT Treatment:                                                DATE: 02/24/23 Therapeutic Exercise: Nu step 5 min UE/LE during subjective Blue band paloff 2x8 each direction cues for posture and breath control  STS from lowest mat, x8 BW; 2x5 w 5# Seated on dynadisc + fwd chest press 2#, 2x15 cues for posture and breath control Suitcase carry 10# 2x78ft bil cues for posture/pacing HEP update + education      [facility] Adult PT Treatment:                                                 DATE: 02/19/2023  Therapeutic Activity:  Reassessment of objective measures and subjective assessment regarding progress towards established goals and plan for extended POC to address remaining deficits    [facility] Adult PT Treatment:  DATE: 02/17/2023  Therapeutic Exercise:  Suitcase carry 8# x124ft BIL cues for pacing Standing paloff press green band 2 x10 BIL  Standing hip abduction (from toe tap), 2 x10 each  Standing hip extension (from toe tap), 2 x 10 each  Dead bug modification with ball, LE only, x3 to fatigue Nu step 5 min warmup LE/UE L6  [facility] Adult PT Treatment:                                                DATE: 02/11/2023  Therapeutic Exercise:  STS 2 x 10 from raised mat Suitcase carry 8# x184ft BIL cues for pacing Standing paloff press green band 2 x10 BIL  Sciatic nerve glide seated 2x10  Seated fwd chest press 2# seated on dynadisc, 2 x 15 Sidestepping at counter 4 laps  Hooklying ball squeeze, 2 x 10, 3 sec hold  Hooklying clamshells, 2 x 10, GTB Hooklying marches, 2 x 10 (5 each LE), GTB Hooklying bridges, 2 x 5, GTB Nu step 5 min warmup LE/UE L6   [facility] Adult PT Treatment:                                                DATE: 02/09/23 Therapeutic Exercise: Suitcase carry 8# 3x13ft BIL cues for pacing Sciatic nerve glides LLE only 2x8  Standing paloff press red band x10 BIL, green band x10 BIL  Sciatic nerve glide seated x10  STS x8 from raised mat Seated fwd chest press 2# x15 cues for posture Seated fwd chest press 2# seated on dynadisc Sidestepping at counter 2x1:30 HEP update + education    PATIENT EDUCATION:  Education details: rationale for interventions, HEP  Person educated: Patient Education method: Explanation, Demonstration, and Handouts Education comprehension: verbalized understanding, returned demonstration, and needs further education  HOME EXERCISE PROGRAM: Access Code:  [email or web address] URL: [email or web address] Date: 02/24/2023 Prepared by: Fransisco Hertz  Exercises - Seated Lumbar Flexion Stretch  - 2 x daily - 7 x weekly - 2 sets - 10 reps - 3 sec hold - Sit to Stand with Armchair  - 2 x daily - 7 x weekly - 1 sets - 8 reps - Standing Anti-Rotation Press with Anchored Resistance  - 2 x daily - 7 x weekly - 1 sets - 10 reps - Seated Sciatic Tensioner  - 2-3 x daily - 7 x weekly - 1 sets - 10 reps  ASSESSMENT:  CLINICAL IMPRESSION: Jadalynn continues to report Lt hip pain and muscle fatigue with standing activities. She was able to perform hurdle step overs with some difficulty. Plan is to continue to address step overs and update home exercise program. She will be discharged at next visit, which she is agreeable to at this time.     OBJECTIVE IMPAIRMENTS: decreased activity tolerance, decreased endurance, decreased mobility, decreased strength, impaired perceived functional ability, impaired UE functional use, improper body mechanics, postural dysfunction, and pain.   ACTIVITY LIMITATIONS: carrying, lifting, bending, standing, squatting, stairs, transfers, and hygiene/grooming  PARTICIPATION LIMITATIONS: meal prep, cleaning, laundry, shopping, community activity, and occupation  PERSONAL FACTORS: Past/current experiences, Time since onset of injury/illness/exacerbation, and 3+ comorbidities: PMHx includes: Arthritis, Asthma, DM type II, HTN, Obesity, OSA, syncope, BIL TKR, essential HTN  are also affecting patient's functional outcome.   REHAB POTENTIAL: Fair    CLINICAL DECISION MAKING: Evolving/moderate complexity  EVALUATION COMPLEXITY: Moderate   GOALS: Goals reviewed with patient? Yes  SHORT TERM GOALS: Target date: 02/11/2023   Patient will be independent with initial home program for LS mobility and LQ strengthening.  Baseline: provided at eval  02/09/23: reports good adherence w/ HEP  Goal status: MET     LONG TERM GOALS:  Target date: 03/21/2023, last updated 02/19/23   Patient will demonstrate improved LS AROM to at least 75% extension.   Baseline:  AROM eval 02/19/23  Flexion 80% Reported P! when returning to standing  90%, only limited by HS tightness   Extension 50% , increased p! 75%  Right lateral flexion 100% 100%  Left lateral flexion 100% 100%   Goal status: ONGOING  2.  Patient will demonstrate increased LE MMT to 5/5 bilaterally.  Baseline:  MMT Right eval Left eval Right 02/19/23 Left 02/19/23  Hip flexion 4+ 4 4+ 4+  Hip abduction 4+ 4+ 4+ 4+  Hip adduction 5 5    Knee extension 5 5     Goal status: ONGOING  3.  Patient will report improved overall functional ability with FOTO score of 53 or greater.   Baseline: 38 current 02/19/23: 58 Goal status: MET  4.  Patient will report improved standing and walking tolerance to 30 minutes or greater for regular walking program and performance of household tasks.  Baseline: 15-20 minutes  02/19/23: 20-25 minutes  Goal status: ONGOING  5.  Patient will demonstrate ability to ascend/descend stairs x 15 without exacerbation of symptoms.  Baseline: moderate difficulty  02/19/23: minimal difficulty; patient has a ramp at home, and reports that she doesn't need to use stairs at home.  Goal status: PARTIALLY MET   6.  Patient will demonstrate ability to perform floor to waist lifting of at least 20# using appropriate body mechanics and with no more than minimal pain in order to safely perform normal daily/occupational tasks.   Baseline: unable  02/19/23: able to lift her grandbaby if necessary, but unable to carry her.  Goal status: ONGOING  PLAN:  PT FREQUENCY: 1x/week, updated 02/19/23  PT DURATION: 4 weeks, updated 02/19/23  PLANNED INTERVENTIONS: Therapeutic exercises, Therapeutic activity, Neuromuscular re-education, Patient/Family education, Self Care, Joint mobilization, Aquatic Therapy, Dry Needling, Electrical stimulation,  Cryotherapy, Moist heat, Taping, Traction, Manual therapy, and Re-evaluation.  PLAN FOR NEXT SESSION: continue to progress functional strengthening within pt tolerance and time spent in WB with reduced rest breaks as appropriate. Continue with centralization strategies PRN     Mauri Reading, PT, DPT   03/11/2023 4:23 PM

## 2023-03-16 ENCOUNTER — Ambulatory Visit (INDEPENDENT_AMBULATORY_CARE_PROVIDER_SITE_OTHER): Payer: Self-pay

## 2023-03-16 DIAGNOSIS — J309 Allergic rhinitis, unspecified: Secondary | ICD-10-CM | POA: Diagnosis not present

## 2023-03-18 ENCOUNTER — Ambulatory Visit: Payer: Medicare Other

## 2023-03-18 DIAGNOSIS — M5416 Radiculopathy, lumbar region: Secondary | ICD-10-CM

## 2023-03-18 NOTE — Therapy (Signed)
OUTPATIENT PHYSICAL THERAPY NOTE   Patient Name: Cheryl [name] MRN: [id number] DOB:11-Jan-1956, 67 y.o., female Today's Date: 03/18/2023  PHYSICAL THERAPY DISCHARGE SUMMARY  Visits from Start of Care: 15  Current functional level related to goals / functional outcomes: See objective findings/assessment    Remaining deficits: See objective findings/assessment    Education / Equipment: See today's treatment/assessment      Patient agrees to discharge. Patient goals were partially met. Patient is being discharged due to being pleased with the current functional level.    END OF SESSION:  PT End of Session - 03/18/23 1429     Visit Number 15    Number of Visits 15    Authorization Type MCR    PT Start Time 1403    PT Stop Time 1433    PT Time Calculation (min) 30 min    Activity Tolerance Patient tolerated treatment well    Behavior During Therapy WFL for tasks assessed/performed               Past Medical History:  Diagnosis Date   Allergic rhinitis, cause unspecified    Arthritis    Asthma    Carpal tunnel syndrome    Cataract    Diabetes mellitus without complication (HCC)    diet- controlled   GERD (gastroesophageal reflux disease)    Glaucoma    Hypertension    Impaired fasting glucose    Morbid obesity (HCC)    OSA (obstructive sleep apnea) 12/02/2016   Other malaise and fatigue    Other specified cardiac dysrhythmias(427.89)    Pain in joint, site unspecified    Symptomatic menopausal or female climacteric states    Syncope and collapse    Past Surgical History:  Procedure Laterality Date   ABDOMINAL HYSTERECTOMY     1994   carpal tunnel both hands     2012   CLOSED MANIPULATION SHOULDER Left 04/2014   EYE SURGERY Left 08/25/2015   EYE SURGERY Right 09/24/2015   JOINT REPLACEMENT     both knees replacement, 2010   mole removed from face     Nodule removed from back     SHOULDER SURGERY Left 11/2013   TONSILLECTOMY     as teenager    Patient Active Problem List   Diagnosis Date Noted   Hyperlipidemia associated with type 2 diabetes mellitus (HCC) 01/25/2021   Type 2 diabetes mellitus with morbid obesity (HCC) 01/25/2021   Moderate persistent asthma, uncomplicated 11/04/2018   OSA (obstructive sleep apnea) 12/02/2016   Hyperlipidemia LDL goal <100 10/21/2014   Essential hypertension, benign 10/21/2014   DM w/o complication type II (HCC) 09/13/2014   Routine general medical examination at a health care facility 10/12/2013   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 05/03/2013   Need for prophylactic vaccination and inoculation against influenza 02/08/2013   GERD (gastroesophageal reflux disease) 02/08/2013   Other and unspecified hyperlipidemia 11/10/2012   Type II or unspecified type diabetes mellitus without mention of complication, not stated as uncontrolled 10/25/2012   Impaired fasting glucose 09/07/2012   Morbid obesity (HCC) 09/07/2012   Generalized osteoarthrosis, involving multiple sites 09/07/2012   Carpal tunnel syndrome 09/07/2012   Other, multiple, and unspecified sites, insect bite, nonvenomous, without mention of infection(919.4) 09/07/2012   Special screening for malignant neoplasms, colon 09/07/2012   Other specified cardiac dysrhythmias(427.89) 09/07/2012   Candidiasis of vulva and vagina 09/07/2012   Abdominal pain, generalized 09/07/2012   Symptomatic menopausal or female climacteric states 09/07/2012  Hypertension associated with diabetes (HCC) 09/07/2012   Perennial allergic rhinitis 09/07/2012   Pain in joint 09/07/2012   Other malaise and fatigue 09/07/2012    PCP: Marcine Matar, MD  REFERRING PROVIDER: Marcine Matar, MD  REFERRING DIAG: Sciatica of left side associated with disorder of lumbar spine [M53.86]   Rationale for Evaluation and Treatment: Rehabilitation  THERAPY DIAG:  Radiculopathy, lumbar region  ONSET DATE: 1+ year   SUBJECTIVE:                                                                                                                                                                                            SUBJECTIVE STATEMENT: Patient [place]000 steps. She required some seated breaks and had some fatigue the next day. She states that her goal at this time is to walk 5,000 steps/day, stating "it seems like if I can hit 5,000 steps then I don't really have any pain."    PERTINENT HISTORY:  PMHx includes: Arthritis, Asthma, DM type II, HTN, Obesity, OSA, syncope, BIL TKR, essential HTN  PAIN:  Are you having pain? No pain at present, worst 2/10 in past week Pain location: Lt LE to the top of Lt foot  Pain description: burning, aching, "pain"  Aggravating factors: standing (>15-20 min), walking, stairs, heavy lifting/carrying  Relieving factors: injection, heating pad   PRECAUTIONS: None  RED FLAGS: None   WEIGHT BEARING RESTRICTIONS: No  FALLS:  Has patient fallen in last 6 months? No  LIVING ENVIRONMENT: Lives with: lives with their family and lives alone Lives in: House/apartment Stairs: No Has following equipment at home: Single point cane, Environmental consultant - 2 wheeled, shower chair, and Grab bars  OCCUPATION: n/a   PLOF: Independent  PATIENT GOALS: "I hope that I can get out in the park to walk, and to keep up with my 3 y.o. grandbaby."   NEXT MD VISIT: TBD     OBJECTIVE: (objective measures completed at initial evaluation unless otherwise dated)   DIAGNOSTIC FINDINGS:  Had MRI lumbar spine 05/05/22 that showed:   IMPRESSION: 1. Mild multilevel degenerative changes of the lumbar spine, slightly progressed at the L4-5 level where there is mild-to-moderate right and mild left foraminal stenosis. 2. No significant canal stenosis at any level.  PATIENT SURVEYS:  FOTO 38 current, 53 predicted  02/02/23 FOTO: 40  COGNITION: Overall cognitive status: Within  functional limits for tasks assessed     SENSATION: Not tested  PALPATION: Moderate pain with CPA from L3-S1, and moderate tenderness to palpation along Lt QL and lumbar paraspinals  LUMBAR ROM:   AROM eval 02/19/23  Flexion 80% Reported P! when returning to standing  90%, only limited by HS tightness   Extension 50% , increased p! 75%  Right lateral flexion 100% 100%  Left lateral flexion 100% 100%  Right rotation    Left rotation     (Blank rows = not tested)   LOWER EXTREMITY MMT:    MMT Right eval Left eval Right 02/19/23 Left 02/19/23  Hip flexion 4+ 4 4+ 4+  Hip extension      Hip abduction 4+ 4+ 4+ 4+  Hip adduction 5 5    Hip internal rotation      Hip external rotation      Knee flexion      Knee extension 5 5    Ankle dorsiflexion      Ankle plantarflexion      Ankle inversion      Ankle eversion        GAIT: Distance walked: 100 ft Assistive device utilized: None Level of assistance: Modified independence Comments: SPC, ambulates slowly with wide BOS, short step length     TODAY'S TREATMENT:      [facility] Adult PT Treatment:                                                DATE: 03/11/2023  Therapeutic Exercise: Nu step 5 min LE/UE  Therapeutic Activity:  Reassessment of objective measures and subjective assessment regarding progress towards established goals and plan for independence with prescribed home program following discharged from PT     PATIENT EDUCATION:  Education details: rationale for interventions, HEP  Person educated: Patient Education method: Explanation, Demonstration, and Handouts Education comprehension: verbalized understanding, returned demonstration, and needs further education  HOME EXERCISE PROGRAM: Access Code: [email or web address] URL: [email or web address] Date: 02/24/2023 Prepared by: Fransisco Hertz  Exercises - Seated Lumbar Flexion Stretch  - 2 x daily - 7 x weekly - 2 sets - 10 reps - 3 sec hold - Sit to  Stand with Armchair  - 2 x daily - 7 x weekly - 1 sets - 8 reps - Standing Anti-Rotation Press with Anchored Resistance  - 2 x daily - 7 x weekly - 1 sets - 10 reps - Seated Sciatic Tensioner  - 2-3 x daily - 7 x weekly - 1 sets - 10 reps  ASSESSMENT:  CLINICAL IMPRESSION: Kamori has attended 15 total PT sessions d/t persistent low back pain with radicular symptoms. She has demonstrated good improvements with LS AROM and LE MMT. She also reports improved tolerance of stair climbing and prolonged walking. She has remaining difficulty with lifting her granddaughter, but otherwise is ready for discharged from skilled PT services at this time. Provided patient education regarding lifting mechanics.     OBJECTIVE IMPAIRMENTS: decreased activity tolerance, decreased endurance, decreased mobility, decreased strength, impaired perceived functional ability, impaired UE functional use, improper body mechanics, postural dysfunction, and pain.   ACTIVITY LIMITATIONS: carrying, lifting, bending, standing, squatting, stairs, transfers, and hygiene/grooming  PARTICIPATION LIMITATIONS: meal prep, cleaning, laundry, shopping, community activity, and occupation  PERSONAL FACTORS: Past/current experiences, Time since onset of injury/illness/exacerbation, and 3+ comorbidities: PMHx includes: Arthritis, Asthma, DM type II, HTN, Obesity, OSA, syncope, BIL TKR, essential HTN  are also affecting patient's functional outcome.   REHAB POTENTIAL: Fair    CLINICAL DECISION MAKING: Evolving/moderate complexity  EVALUATION COMPLEXITY: Moderate   GOALS: Goals reviewed with patient? Yes  SHORT TERM GOALS: Target date: 02/11/2023   Patient will be independent with initial home program for LS mobility and LQ strengthening.  Baseline: provided at eval  02/09/23: reports good adherence w/ HEP  Goal status: MET     LONG TERM GOALS: Target date: 03/21/2023, last updated 02/19/23   Patient will demonstrate improved LS  AROM to at least 75% extension.   Baseline:  AROM eval 02/19/23 03/18/23  Flexion 80% Reported P! when returning to standing  90%, only limited by HS tightness  90%  Extension 50% , increased p! 75% 75%  Right lateral flexion 100% 100% 100%  Left lateral flexion 100% 100% 100%   Goal status: MET  2.  Patient will demonstrate increased LE MMT to 5/5 bilaterally.  Baseline:  MMT Right eval Left eval Right 02/19/23 Left 02/19/23 Right 03/18/23 Left 03/18/23  Hip flexion 4+ 4 4+ 4+ 4+ 4+  Hip abduction 4+ 4+ 4+ 4+ 5 5  Hip adduction 5 5      Knee extension 5 5       Goal status: MET  3.  Patient will report improved overall functional ability with FOTO score of 53 or greater.   Baseline: 38 current 02/19/23: 58 Goal status: MET  4.  Patient will report improved standing and walking tolerance to 30 minutes or greater for regular walking program and performance of household tasks.  Baseline: 15-20 minutes  02/19/23: 20-25 minutes  03/18/23: 30+ minutes over the weekend.  Goal status: MET  5.  Patient will demonstrate ability to ascend/descend stairs x 15 without exacerbation of symptoms.  Baseline: moderate difficulty  02/19/23: minimal difficulty; patient has a ramp at home, and reports that she doesn't need to use stairs at home.  Goal status: PARTIALLY MET   6.  Patient will demonstrate ability to perform floor to waist lifting of at least 20# using appropriate body mechanics and with no more than minimal pain in order to safely perform normal daily/occupational tasks.   Baseline: unable  02/19/23: able to lift her grandbaby if necessary, but unable to carry her.  Goal status: NOT MET   PLAN:  PT FREQUENCY: 1x/week, updated 02/19/23  PT DURATION: 4 weeks, updated 02/19/23  PLANNED INTERVENTIONS: Therapeutic exercises, Therapeutic activity, Neuromuscular re-education, Patient/Family education, Self Care, Joint mobilization, Aquatic Therapy, Dry Needling, Electrical  stimulation, Cryotherapy, Moist heat, Taping, Traction, Manual therapy, and Re-evaluation.     Mauri Reading, PT, DPT   03/18/2023 2:38 PM

## 2023-03-25 ENCOUNTER — Ambulatory Visit (INDEPENDENT_AMBULATORY_CARE_PROVIDER_SITE_OTHER): Payer: Medicare Other | Admitting: *Deleted

## 2023-03-25 DIAGNOSIS — J309 Allergic rhinitis, unspecified: Secondary | ICD-10-CM

## 2023-04-01 DIAGNOSIS — R296 Repeated falls: Secondary | ICD-10-CM | POA: Diagnosis not present

## 2023-04-01 DIAGNOSIS — G4733 Obstructive sleep apnea (adult) (pediatric): Secondary | ICD-10-CM | POA: Diagnosis not present

## 2023-04-01 DIAGNOSIS — Z820 Family history of epilepsy and other diseases of the nervous system: Secondary | ICD-10-CM | POA: Diagnosis not present

## 2023-04-01 DIAGNOSIS — R2681 Unsteadiness on feet: Secondary | ICD-10-CM | POA: Diagnosis not present

## 2023-04-01 DIAGNOSIS — Z1379 Encounter for other screening for genetic and chromosomal anomalies: Secondary | ICD-10-CM | POA: Diagnosis not present

## 2023-04-09 DIAGNOSIS — R296 Repeated falls: Secondary | ICD-10-CM | POA: Diagnosis not present

## 2023-04-09 DIAGNOSIS — Z820 Family history of epilepsy and other diseases of the nervous system: Secondary | ICD-10-CM | POA: Diagnosis not present

## 2023-04-09 DIAGNOSIS — G4733 Obstructive sleep apnea (adult) (pediatric): Secondary | ICD-10-CM | POA: Diagnosis not present

## 2023-04-09 DIAGNOSIS — R2681 Unsteadiness on feet: Secondary | ICD-10-CM | POA: Diagnosis not present

## 2023-04-14 ENCOUNTER — Ambulatory Visit (INDEPENDENT_AMBULATORY_CARE_PROVIDER_SITE_OTHER): Payer: Medicare Other | Admitting: *Deleted

## 2023-04-14 DIAGNOSIS — J309 Allergic rhinitis, unspecified: Secondary | ICD-10-CM | POA: Diagnosis not present

## 2023-04-18 ENCOUNTER — Other Ambulatory Visit: Payer: Self-pay | Admitting: Internal Medicine

## 2023-04-18 DIAGNOSIS — I152 Hypertension secondary to endocrine disorders: Secondary | ICD-10-CM

## 2023-04-30 ENCOUNTER — Ambulatory Visit (INDEPENDENT_AMBULATORY_CARE_PROVIDER_SITE_OTHER): Payer: Medicare Other | Admitting: Allergy & Immunology

## 2023-04-30 ENCOUNTER — Encounter: Payer: Self-pay | Admitting: Allergy & Immunology

## 2023-04-30 ENCOUNTER — Other Ambulatory Visit: Payer: Self-pay

## 2023-04-30 VITALS — BP 128/62 | HR 68 | Temp 98.3°F | Ht 68.0 in | Wt 296.5 lb

## 2023-04-30 DIAGNOSIS — J302 Other seasonal allergic rhinitis: Secondary | ICD-10-CM | POA: Diagnosis not present

## 2023-04-30 DIAGNOSIS — J4540 Moderate persistent asthma, uncomplicated: Secondary | ICD-10-CM

## 2023-04-30 DIAGNOSIS — J3089 Other allergic rhinitis: Secondary | ICD-10-CM

## 2023-04-30 NOTE — Patient Instructions (Addendum)
H1. Moderate persistent asthma, uncomplicated - Lung testing looked great today!  - Sorry it took 10 blows to get it to work.  - Daily controller medication(s): Symbicort 160/4.1mcg two puffs twice daily with spacer - Prior to physical activity: albuterol 2 puffs 10-15 minutes before physical activity. - Rescue medications: albuterol 4 puffs every 4-6 hours as needed - Asthma control goals:  * Full participation in all desired activities (may need albuterol before activity) * Albuterol use two time or less a week on average (not counting use with activity) * Cough interfering with sleep two time or less a month * Oral steroids no more than once a year * No hospitalizations  2. Perennial allergic rhinitis (grasses, ragweed, outdoor molds, dust mites, cockroach) - Continue with allergy shots at the same schedule.  - Continue with cetirizine 10 mg daily. - Try stopping the Astelin (azelastine) to see how you do.  - Continue with Flonase 1 to 2 sprays per nostril ONCE DAILY.  - Use the nasal saline rinses religiously for the next month or so to stay ahead of these allergy symptoms.   3. Return in about 6 months (around 10/29/2023). You can have the follow up appointment with Dr. Dellis Anes or a Nurse Practicioner (our Nurse Practitioners are excellent and always have Physician oversight!).    Please inform us of any Emergency Department visits, hospitalizations, or changes in symptoms. Call us before going to the ED for breathing or allergy symptoms since we might be able to fit you in for a sick visit. Feel free to contact us anytime with any questions, problems, or concerns.  It was a pleasure to see you again today!  Websites that have reliable patient information: 1. American Academy of Asthma, Allergy, and Immunology: www.aaaai.org 2. Food Allergy Research and Education (FARE): foodallergy.org 3. Mothers of Asthmatics: http://www.asthmacommunitynetwork.org 4. American College of Allergy,  Asthma, and Immunology: www.acaai.org      "Like" [place] on Facebook and Instagram for our latest updates!      A healthy democracy works best when [employer] participate! Make sure you are registered to vote! If you have moved or changed any of your contact information, you will need to get this updated before voting! Scan the QR codes below to learn more!

## 2023-04-30 NOTE — Progress Notes (Signed)
FOLLOW UP  Date of Service/Encounter:  04/30/23   Assessment:   Moderate persistent asthma - did not do spirometry today per patient request   Perennial and seasonal allergic rhinitis (grasses,  ragweed, outdoor molds, dust mites, cockroach)   Currently on allergen immunotherapy for dust mites, cockroach, ragweed - on allergen immunotherapy with maintenance reached August 2020   Complex medical history   Fully vaccinated for [facility], including the latest bivalent booster   Recently experienced fainting episode - unclear etiology  Plan/Recommendations:   1. Moderate persistent asthma, uncomplicated - Lung testing looked great today!  - Sorry it took 10 blows to get it to work.  - Daily controller medication(s): Symbicort 160/4.73mcg two puffs twice daily with spacer - Prior to physical activity: albuterol 2 puffs 10-15 minutes before physical activity. - Rescue medications: albuterol 4 puffs every 4-6 hours as needed - Asthma control goals:  * Full participation in all desired activities (may need albuterol before activity) * Albuterol use two time or less a week on average (not counting use with activity) * Cough interfering with sleep two time or less a month * Oral steroids no more than once a year * No hospitalizations  2. Perennial allergic rhinitis (grasses, ragweed, outdoor molds, dust mites, cockroach) - Continue with allergy shots at the same schedule.  - Continue with cetirizine 10 mg daily. - Try stopping the Astelin (azelastine) to see how you do.  - Continue with Flonase 1 to 2 sprays per nostril ONCE DAILY.  - Use the nasal saline rinses religiously for the next month or so to stay ahead of these allergy symptoms.   3. Return in about 6 months (around 10/29/2023). You can have the follow up appointment with Dr. Dellis Anes or a Nurse Practicioner (our Nurse Practitioners are excellent and always have Physician oversight!).   Subjective:   Cheryl [name] is a 67  y.o. female presenting today for follow up of  Chief Complaint  Patient presents with   Allergic Rhinitis    Asthma   Follow-up    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 05/03/2013   Need for prophylactic vaccination and inoculation against influenza 02/08/2013   GERD (gastroesophageal reflux disease) 02/08/2013   Other and unspecified hyperlipidemia 11/10/2012   Type II or unspecified type diabetes mellitus without mention of complication, not stated as uncontrolled 10/25/2012   Impaired fasting glucose 09/07/2012   Morbid obesity (HCC) 09/07/2012   Generalized osteoarthrosis, involving multiple sites 09/07/2012   Carpal tunnel syndrome 09/07/2012   Other, multiple, and unspecified sites, insect bite, nonvenomous, without mention of infection(919.4) 09/07/2012   Special screening for malignant neoplasms, colon 09/07/2012   Other specified cardiac dysrhythmias(427.89) 09/07/2012   Candidiasis of vulva and vagina 09/07/2012   Abdominal pain, generalized 09/07/2012   Symptomatic menopausal or female climacteric states 09/07/2012   Hypertension associated with diabetes (HCC) 09/07/2012   Perennial allergic rhinitis 09/07/2012   Pain in joint 09/07/2012   Other malaise  and fatigue 09/07/2012    History obtained from: chart review and patient.  Discussed the use of AI scribe software for clinical note transcription with the patient and/or  guardian, who gave verbal consent to proceed.  Cheryl [name] is a 67 y.o. female presenting for a follow up visit.  She was last seen in June 2024.  At that time, lung testing looked great.  We continue with Symbicort 160 mcg 2 puffs twice daily and albuterol as needed.  For her allergic rhinitis, we continue with allergy shots as well as cetirizine, Flonase, and Astelin.  Since the last visit, she has done remarkably well.   Asthma/Respiratory Symptom History: Since the last visit, she has done fairly well. She reports overall improvement in her condition. Cheryl [name]'s asthma has been well controlled. She has not required rescue medication, experienced nocturnal awakenings due to lower respiratory symptoms, nor have activities of daily living been limited. She has required no Emergency Department or Urgent Care visits for her asthma. She has required zero courses of systemic steroids for asthma exacerbations since the last visit. ACT score today is 22, indicating excellent asthma symptom control.   Allergic Rhinitis Symptom History: The patient has been able to reduce the frequency of her allergy shots from twice a week to once every four weeks. She also reports using two different nasal sprays daily, one of which she has reduced to once a day from twice a day. She is open to stopping a spray to make it PRN to see how she does.   Cheryl [name] is on allergen immunotherapy. She receives two injections. Immunotherapy script #1 contains weeds, grasses, and dust mites. She currently receives 0.88mL of the RED vial (1/100). Immunotherapy script #2 contains molds and cockroach. She currently receives 0.92mL of the RED vial (1/100). She started shots January of 2024 and reached maintenance in April 2024. We restarted her allergy shots in January 2024 after we added grasses and molds. She had repeat testing in October 2023 which is why we remixed her shots. Her symptoms were not fully controlled on the previous script. She is doing well  with this current script and feels that they have been efficacious.   The patient experienced two significant health events while traveling in [place]. The first incident involved a blackout, which resulted in a hospital stay. The cause of the blackout was not determined. The second incident did not involve a blackout but did require a visit to urgent care. The patient did not provide details about the symptoms or diagnosis related to this second incident. She has never had her driving privileges removed.   Despite these incidents, the patient reports feeling good overall. She has been maintaining physical activity by walking, although less frequently due to cold weather. She plans to increase her walking time as the weather improves.  The patient is also dealing with changes in her insurance coverage, which may impact her ability to afford her medications. 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 She expresses concern about potential increases in her copay and deductible.   The patient's son, who also works at the post office, has been with the company for 19 years. The patient worked there for 20 years, and her son's father worked there for about 30 years.    Otherwise, there have been no changes to her past medical history, surgical history, family history, or social history.    Review of systems otherwise negative other than  that mentioned in the HPI.    Objective:   Blood pressure 128/62, pulse 68, temperature 98.3 F (36.8 C), temperature source Temporal, height 5\' 8"  (1.727 m), weight 296 lb 8 oz (134.5 kg), SpO2 98%. Body mass index is 45.08 kg/m.    Physical Exam Vitals reviewed.  Constitutional:      Appearance: She is well-developed.     Comments: Lovely and smiling.   HENT:     Head: Normocephalic and atraumatic.     Right Ear: Tympanic membrane, ear canal and external ear normal.     Left Ear: Tympanic membrane,  ear canal and external ear normal.     Nose: Mucosal edema and rhinorrhea present. No nasal deformity or septal deviation.     Right Turbinates: Swollen and pale. Not enlarged.     Left Turbinates: Swollen and pale. Not enlarged.     Right Sinus: No maxillary sinus tenderness or frontal sinus tenderness.     Left Sinus: No maxillary sinus tenderness or frontal sinus tenderness.     Mouth/Throat:     Mouth: Mucous membranes are not pale and not dry.     Pharynx: Uvula midline.  Eyes:     General: Lids are normal. Allergic shiner present.        Right eye: No discharge.        Left eye: No discharge.     Conjunctiva/sclera: Conjunctivae normal.     Right eye: Right conjunctiva is not injected. No chemosis.    Left eye: Left conjunctiva is not injected. No chemosis.    Pupils: Pupils are equal, round, and reactive to light.  Cardiovascular:     Rate and Rhythm: Normal rate and regular rhythm.     Heart sounds: Normal heart sounds.  Pulmonary:     Effort: Pulmonary effort is normal. No tachypnea, accessory muscle usage or respiratory distress.     Breath sounds: Normal breath sounds. No wheezing, rhonchi or rales.     Comments: Moving air well in all lung fields.  No increased work of breathing. Chest:     Chest wall: No tenderness.  Lymphadenopathy:     Cervical: No cervical adenopathy.  Skin:    Coloration: Skin is not pale.     Findings: No abrasion, erythema, petechiae or rash. Rash is not papular, urticarial or vesicular.  Neurological:     Mental Status: She is alert.  Psychiatric:        Behavior: Behavior is cooperative.      Diagnostic studies:    Spirometry: results normal (FEV1: 1.93/89%, FVC: 2.19/78%, FEV1/FVC: 88%).    Spirometry consistent with normal pattern.    Allergy Studies: none       Malachi Bonds, MD  [facility]

## 2023-05-01 ENCOUNTER — Encounter: Payer: Self-pay | Admitting: Allergy & Immunology

## 2023-05-06 DIAGNOSIS — R112 Nausea with vomiting, unspecified: Secondary | ICD-10-CM | POA: Diagnosis not present

## 2023-05-06 DIAGNOSIS — Z01419 Encounter for gynecological examination (general) (routine) without abnormal findings: Secondary | ICD-10-CM | POA: Diagnosis not present

## 2023-05-08 ENCOUNTER — Other Ambulatory Visit: Payer: Self-pay | Admitting: Student

## 2023-05-08 DIAGNOSIS — E1142 Type 2 diabetes mellitus with diabetic polyneuropathy: Secondary | ICD-10-CM | POA: Diagnosis not present

## 2023-05-08 DIAGNOSIS — K219 Gastro-esophageal reflux disease without esophagitis: Secondary | ICD-10-CM | POA: Diagnosis not present

## 2023-05-08 DIAGNOSIS — E1151 Type 2 diabetes mellitus with diabetic peripheral angiopathy without gangrene: Secondary | ICD-10-CM | POA: Diagnosis not present

## 2023-05-08 DIAGNOSIS — L84 Corns and callosities: Secondary | ICD-10-CM | POA: Diagnosis not present

## 2023-05-08 DIAGNOSIS — R1031 Right lower quadrant pain: Secondary | ICD-10-CM | POA: Diagnosis not present

## 2023-05-08 DIAGNOSIS — L603 Nail dystrophy: Secondary | ICD-10-CM | POA: Diagnosis not present

## 2023-05-08 DIAGNOSIS — I739 Peripheral vascular disease, unspecified: Secondary | ICD-10-CM | POA: Diagnosis not present

## 2023-05-08 DIAGNOSIS — R110 Nausea: Secondary | ICD-10-CM

## 2023-05-08 DIAGNOSIS — R1110 Vomiting, unspecified: Secondary | ICD-10-CM | POA: Diagnosis not present

## 2023-05-11 ENCOUNTER — Ambulatory Visit (INDEPENDENT_AMBULATORY_CARE_PROVIDER_SITE_OTHER): Payer: Self-pay | Admitting: *Deleted

## 2023-05-11 DIAGNOSIS — J309 Allergic rhinitis, unspecified: Secondary | ICD-10-CM | POA: Diagnosis not present

## 2023-05-12 ENCOUNTER — Emergency Department (HOSPITAL_COMMUNITY)
Admission: EM | Admit: 2023-05-12 | Discharge: 2023-05-13 | Disposition: A | Discharge status: Home / Self Care | Payer: Medicare Other | Attending: Emergency Medicine | Admitting: Emergency Medicine

## 2023-05-12 ENCOUNTER — Encounter (HOSPITAL_COMMUNITY): Payer: Self-pay

## 2023-05-12 ENCOUNTER — Emergency Department (HOSPITAL_COMMUNITY): Payer: Medicare Other

## 2023-05-12 DIAGNOSIS — K6289 Other specified diseases of anus and rectum: Secondary | ICD-10-CM | POA: Diagnosis not present

## 2023-05-12 DIAGNOSIS — R1084 Generalized abdominal pain: Secondary | ICD-10-CM | POA: Diagnosis not present

## 2023-05-12 DIAGNOSIS — K5900 Constipation, unspecified: Secondary | ICD-10-CM

## 2023-05-12 DIAGNOSIS — Z7982 Long term (current) use of aspirin: Secondary | ICD-10-CM | POA: Insufficient documentation

## 2023-05-12 DIAGNOSIS — R140 Abdominal distension (gaseous): Secondary | ICD-10-CM | POA: Diagnosis not present

## 2023-05-12 DIAGNOSIS — I10 Essential (primary) hypertension: Secondary | ICD-10-CM | POA: Diagnosis not present

## 2023-05-12 DIAGNOSIS — K760 Fatty (change of) liver, not elsewhere classified: Secondary | ICD-10-CM | POA: Diagnosis not present

## 2023-05-12 DIAGNOSIS — R109 Unspecified abdominal pain: Secondary | ICD-10-CM | POA: Diagnosis not present

## 2023-05-12 DIAGNOSIS — Z90710 Acquired absence of both cervix and uterus: Secondary | ICD-10-CM | POA: Diagnosis not present

## 2023-05-12 DIAGNOSIS — R1031 Right lower quadrant pain: Secondary | ICD-10-CM | POA: Diagnosis present

## 2023-05-12 LAB — COMPREHENSIVE METABOLIC PANEL
ALT: 18 U/L (ref 0–44)
AST: 31 U/L (ref 15–41)
Albumin: 4.6 g/dL (ref 3.5–5.0)
Alkaline Phosphatase: 56 U/L (ref 38–126)
Anion gap: 15 (ref 5–15)
BUN: 15 mg/dL (ref 8–23)
CO2: 24 mmol/L (ref 22–32)
Calcium: 9.8 mg/dL (ref 8.9–10.3)
Chloride: 96 mmol/L — ABNORMAL LOW (ref 98–111)
Creatinine, Ser: 0.84 mg/dL (ref 0.44–1.00)
GFR, Estimated: >60 mL/min (ref >60)
Glucose, Bld: 132 mg/dL — ABNORMAL HIGH (ref 70–99)
Potassium: 4.4 mmol/L (ref 3.5–5.1)
Sodium: 135 mmol/L (ref 135–145)
Total Bilirubin: 0.6 mg/dL (ref <1.2)
Total Protein: 7.5 g/dL (ref 6.5–8.1)

## 2023-05-12 LAB — CBC WITH DIFFERENTIAL/PLATELET
Abs Immature Granulocytes: 0.06 10*3/uL (ref 0.00–0.07)
Basophils Absolute: 0.1 10*3/uL (ref 0.0–0.1)
Basophils Relative: 1 %
Eosinophils Absolute: 0.1 10*3/uL (ref 0.0–0.5)
Eosinophils Relative: 1 %
HCT: 41.7 % (ref 36.0–46.0)
Hemoglobin: 14.1 g/dL (ref 12.0–15.0)
Immature Granulocytes: 1 %
Lymphocytes Relative: 13 %
Lymphs Abs: 1.4 10*3/uL (ref 0.7–4.0)
MCH: 28.3 pg (ref 26.0–34.0)
MCHC: 33.8 g/dL (ref 30.0–36.0)
MCV: 83.6 fL (ref 80.0–100.0)
Monocytes Absolute: 0.8 10*3/uL (ref 0.1–1.0)
Monocytes Relative: 7 %
Neutro Abs: 8.6 10*3/uL — ABNORMAL HIGH (ref 1.7–7.7)
Neutrophils Relative %: 77 %
Platelets: 353 10*3/uL (ref 150–400)
RBC: 4.99 MIL/uL (ref 3.87–5.11)
RDW: 14.7 % (ref 11.5–15.5)
WBC: 10.9 10*3/uL — ABNORMAL HIGH (ref 4.0–10.5)
nRBC: 0 % (ref 0.0–0.2)

## 2023-05-12 LAB — URINALYSIS, ROUTINE W REFLEX MICROSCOPIC
Bilirubin Urine: NEGATIVE
Glucose, UA: NEGATIVE mg/dL
Hgb urine dipstick: NEGATIVE
Ketones, ur: NEGATIVE mg/dL
Leukocytes,Ua: NEGATIVE
Nitrite: NEGATIVE
Protein, ur: NEGATIVE mg/dL
Specific Gravity, Urine: 1.013 (ref 1.005–1.030)
pH: 6 (ref 5.0–8.0)

## 2023-05-12 LAB — LIPASE, BLOOD: Lipase: 37 U/L (ref 11–51)

## 2023-05-12 MED ORDER — IOHEXOL 350 MG/ML SOLN
75.0000 mL | Freq: Once | INTRAVENOUS | Status: AC | PRN
  Administered 2023-05-12: 75 mL via INTRAVENOUS

## 2023-05-12 NOTE — ED Triage Notes (Signed)
Distended abdomen with lower abdominal pain since this AM. Unable to urinate since yesterday.   EMS VS: 174/110 HR 74 100% on RA CBG 153

## 2023-05-12 NOTE — ED Notes (Signed)
Unable to urinate at this time. In and out cath done with 850cc output. Will continue to monitor.

## 2023-05-12 NOTE — ED Provider Triage Note (Signed)
Emergency Medicine Provider Triage Evaluation Note  Cheryl [name] , a 67 y.o. female  was evaluated in triage.  Pt complains of generalized abdominal pain this been ongoing for several days.  Patient states that she has had difficulty with bowel movements.  States she is feeling so difficult time urinating as well..  Review of Systems  Positive:  Negative:   Physical Exam  BP (!) 158/52 (BP Location: Left Arm)   Pulse (!) 59   Temp 98.3 F (36.8 C) (Oral)   Resp 18   Ht 5\' 8"  (1.727 m)   Wt 134 kg   SpO2 99%   BMI 44.92 kg/m  Gen:   Awake, no distress   Resp:  Normal effort  MSK:   Moves extremities without difficulty  Other:  Abdomen soft, mild distention  Medical Decision Making  Medically screening exam initiated at 3:27 PM.  Appropriate orders placed.  Cheryl [name] was informed that the remainder of the evaluation will be completed by another provider, this initial triage assessment does not replace that evaluation, and the importance of remaining in the ED until their evaluation is complete.  Generalized abdominal pain, suprapubic discomfort.  Will obtain CT imaging the patient's abdomen pelvis.  Based on her symptoms, and discomfort, suspicion is for constipation.   Cheryl Simmonds T, DO 05/12/23 1527

## 2023-05-13 DIAGNOSIS — K6289 Other specified diseases of anus and rectum: Secondary | ICD-10-CM | POA: Diagnosis not present

## 2023-05-13 MED ORDER — DOCUSATE SODIUM 100 MG PO CAPS
100.0000 mg | ORAL_CAPSULE | Freq: Once | ORAL | Status: AC
Start: 2023-05-13 — End: 2023-05-13
  Administered 2023-05-13: 100 mg via ORAL
  Filled 2023-05-13: qty 1

## 2023-05-13 MED ORDER — METRONIDAZOLE 500 MG PO TABS: 500.0000 mg | ORAL_TABLET | Freq: Two times a day (BID) | ORAL | 0 refills | Status: DC

## 2023-05-13 MED ORDER — DOCUSATE SODIUM 100 MG PO CAPS: 100.0000 mg | ORAL_CAPSULE | Freq: Two times a day (BID) | ORAL | 0 refills | Status: DC

## 2023-05-13 MED ORDER — CIPROFLOXACIN HCL 500 MG PO TABS
500.0000 mg | ORAL_TABLET | Freq: Once | ORAL | Status: AC
  Administered 2023-05-13: 500 mg via ORAL
  Filled 2023-05-13: qty 1

## 2023-05-13 MED ORDER — PROMETHAZINE HCL 25 MG PO TABS: 12.5000 mg | ORAL_TABLET | Freq: Three times a day (TID) | ORAL | 0 refills | Status: DC | PRN

## 2023-05-13 MED ORDER — CIPROFLOXACIN HCL 500 MG PO TABS: 500.0000 mg | ORAL_TABLET | Freq: Two times a day (BID) | ORAL | 0 refills | Status: DC

## 2023-05-13 MED ORDER — METRONIDAZOLE 500 MG PO TABS
500.0000 mg | ORAL_TABLET | Freq: Once | ORAL | Status: AC
  Administered 2023-05-13: 500 mg via ORAL
  Filled 2023-05-13: qty 1

## 2023-05-13 NOTE — ED Provider Notes (Signed)
[place] EMERGENCY DEPARTMENT AT [facility] Provider Note   CSN: [id number] Arrival date & time: 05/12/23  1507     History  Chief Complaint  Patient presents with   Abdominal Pain   Urinary Retention    Cheryl [name] is a 67 y.o. female.  67 year old female presents ER today with abdominal pain and urinary retention.  Patient saw her gastroenterologist on Friday at that time she was having intermittent episodes of right lower quadrant pain associated with vomiting.  She states that they gave her Zofran and increase to omeprazole.  Since that time she started having worsening abdominal distention, decreased bowel movements and worsening diffuse abdominal pain.  Had a couple episodes of nonbloody nonbilious emesis.  She had decreased urination as well.  Patient states that she had a bowel movement right before CT scan and then subsequently had a large urinary output as well.  She still feels a bit nauseous but overall feels much better than when she came in.  No fevers.   Abdominal Pain      Home Medications Prior to Admission medications   Medication Sig Start Date End Date Taking? Authorizing Provider  ciprofloxacin (CIPRO) 500 MG tablet Take 1 tablet (500 mg total) by mouth 2 (two) times daily. 05/13/23  Yes Alison Kubicki, Barbara Cower, MD  docusate sodium (COLACE) 100 MG capsule Take 1 capsule (100 mg total) by mouth every 12 (twelve) hours. 05/13/23  Yes Lauree Yurick, Barbara Cower, MD  metroNIDAZOLE (FLAGYL) 500 MG tablet Take 1 tablet (500 mg total) by mouth 2 (two) times daily. One po bid x 7 days 05/13/23  Yes Selisa Tensley, Barbara Cower, MD  promethazine (PHENERGAN) 25 MG tablet Take 0.5 tablets (12.5 mg total) by mouth every 8 (eight) hours as needed for nausea or vomiting. 05/13/23  Yes Benjy Kana, Barbara Cower, MD  acetaminophen (TYLENOL) 500 MG tablet Take 500 mg by mouth daily.    [provider]  albuterol (VENTOLIN HFA) 108 (90 Base) MCG/ACT inhaler INHALE 2 PUFFS INTO THE LUNGS UP TO EVERY 6HRS AS  NEEDED FOR WHEEZING/SHORTNESS OF BREATH 09/17/22   Alfonse Spruce, MD  AMBULATORY NON FORMULARY MEDICATION Medication Name: Allergy Injection- every 4 weeks  "I take it once a week" 01/14/23    [provider]  aspirin 81 MG tablet Take 81 mg by mouth daily.    [provider]  azelastine (ASTELIN) 0.1 % nasal spray Place 2 sprays into both nostrils 2 (two) times daily. Use in each nostril as directed 01/02/23   Alfonse Spruce, MD  B-D ULTRA-FINE 33 LANCETS MISC Use to test blood sugar once daily. Dx: E11.9 09/07/18   Sharon Seller, NP  budesonide-formoterol (SYMBICORT) 160-4.5 MCG/ACT inhaler TAKE 2 PUFFS BY MOUTH TWICE A DAY 06/04/22   Alfonse Spruce, MD  Calcium Carbonate-Vitamin D 600-400 MG-UNIT chew tablet Chew 1 tablet by mouth daily.     [provider]  EPINEPHRINE 0.3 mg/0.3 mL IJ SOAJ injection INJECT 0.3 MG INTO THE MUSCLE AS NEEDED FOR ANAPHYLAXIS. 11/10/22   Alfonse Spruce, MD  fluticasone [facility]) 50 MCG/ACT nasal spray Place 1-2 sprays into both nostrils daily. 06/04/22   Alfonse Spruce, MD  gabapentin (NEURONTIN) 300 MG capsule Take 1 capsule (300 mg total) by mouth at bedtime. 01/01/23   Marcine Matar, MD  levocetirizine (XYZAL) 5 MG tablet TAKE 1 TABLET BY MOUTH EVERY DAY IN THE EVENING 02/03/23   Alfonse Spruce, MD  lisinopril (ZESTRIL) 10 MG tablet TAKE 1 TABLET  BY MOUTH EVERY DAY 04/20/23   Marcine Matar, MD  Multiple Vitamin (MULTIVITAMIN ADULT PO) 1 tablet Patient not taking: Reported on 04/30/2023    [provider]  nabumetone (RELAFEN) 500 MG tablet Take 500 mg by mouth daily. PRN    [provider]  omeprazole (PRILOSEC) 40 MG capsule TAKE 1 CAPSULE (40 MG TOTAL) BY MOUTH DAILY. 12/30/22   Marcine Matar, MD  Respiratory Therapy Supplies [facility] 2 CPAP HOSE HANGER) MISC     [provider]  simvastatin (ZOCOR) 20 MG tablet TAKE 1 TABLET (20 MG) BY ORAL ROUTE ONCE  DAILY IN THE EVENING FOR 90 DAYS 07/28/22   Marcine Matar, MD  sodium fluoride (DENTA 5000 PLUS) 1.1 % CREA dental cream Place 1 Application onto teeth at bedtime. 06/06/21   [provider]  Spacer/Aero-Holding Deretha Emory DEVI 1 each by Does not apply route as needed. 08/05/22   Nehemiah Settle, FNP  triamterene-hydrochlorothiazide (MAXZIDE) 75-50 MG tablet TAKE 1 TABLET BY MOUTH EVERY DAY 02/03/23   Marcine Matar, MD  vitamin C (ASCORBIC ACID) 500 MG tablet Take 500 mg by mouth every other day.    [provider]      Allergies    Misc. sulfonamide containing compounds, Elemental sulfur, and Oxycodone    Review of Systems   Review of Systems  Gastrointestinal:  Positive for abdominal pain.    Physical Exam Updated Vital Signs BP (!) 157/62   Pulse 69   Temp 98.9 F (37.2 C)   Resp 16   Ht 5\' 8"  (1.727 m)   Wt 134 kg   SpO2 97%   BMI 44.92 kg/m  Physical Exam Vitals and nursing note reviewed.  Constitutional:      Appearance: She is well-developed.  HENT:     Head: Normocephalic and atraumatic.  Cardiovascular:     Rate and Rhythm: Normal rate and regular rhythm.  Pulmonary:     Effort: No respiratory distress.     Breath sounds: No stridor.  Abdominal:     General: There is no distension.     Tenderness: There is no abdominal tenderness.  Musculoskeletal:     Cervical back: Normal range of motion.  Neurological:     Mental Status: She is alert.     ED Results / Procedures / Treatments   Labs (all labs ordered are listed, but only abnormal results are displayed) Labs Reviewed  COMPREHENSIVE METABOLIC PANEL - Abnormal; Notable for the following components:      Result Value   Chloride 96 (*)    Glucose, Bld 132 (*)    All other components within normal limits  CBC WITH DIFFERENTIAL/PLATELET - Abnormal; Notable for the following components:   WBC 10.9 (*)    Neutro Abs 8.6 (*)    All other components within normal limits  LIPASE, BLOOD   URINALYSIS, ROUTINE W REFLEX MICROSCOPIC    EKG None  Radiology CT ABDOMEN PELVIS W CONTRAST Result Date: 05/12/2023 CLINICAL DATA:  Acute abdominal pain EXAM: CT ABDOMEN AND PELVIS WITH CONTRAST TECHNIQUE: Multidetector CT imaging of the abdomen and pelvis was performed using the standard protocol following bolus administration of intravenous contrast. RADIATION DOSE REDUCTION: This exam was performed according to the departmental dose-optimization program which includes automated exposure control, adjustment of the mA and/or kV according to patient size and/or use of iterative reconstruction technique. CONTRAST:  75mL OMNIPAQUE IOHEXOL 350 MG/ML SOLN COMPARISON:  CT abdomen and pelvis 03/13/2008 FINDINGS: Lower chest: No  acute abnormality. Hepatobiliary: There is diffuse fatty infiltration of the liver. Gallbladder and bile ducts are within normal limits. Pancreas: Unremarkable. No pancreatic ductal dilatation or surrounding inflammatory changes. Spleen: Normal in size without focal abnormality. Adrenals/Urinary Tract: There some scarring in the superior pole the right kidney similar to prior. There is no hydronephrosis or perinephric fat stranding. The adrenal glands and bladder are within normal limits. Stomach/Bowel: Stomach is within normal limits. Appendix appears normal. There is no bowel obstruction or free air. There some rectal wall thickening with surrounding inflammation best seen on the sagittal imaging. The appendix is within normal limits. Vascular/Lymphatic: No significant vascular findings are present. No enlarged abdominal or pelvic lymph nodes. Reproductive: Status post hysterectomy. No adnexal masses. Other: There is new presacral edema of uncertain etiology. There is no ascites or free air. There is no focal abdominal wall hernia. Musculoskeletal: Degenerative changes affect the spine at L4-L5. There also degenerative changes of both hips in the pubic symphysis. IMPRESSION: 1. Rectal  wall thickening with surrounding inflammation worrisome for proctitis. 2. New presacral edema of uncertain etiology. 3. Fatty infiltration of the liver. Electronically Signed   By: Darliss Cheney M.D.   On: 05/12/2023 19:23    Procedures Procedures    Medications Ordered in ED Medications  ciprofloxacin (CIPRO) tablet 500 mg (has no administration in time range)  metroNIDAZOLE (FLAGYL) tablet 500 mg (has no administration in time range)  docusate sodium (COLACE) capsule 100 mg (has no administration in time range)  iohexol (OMNIPAQUE) 350 MG/ML injection 75 mL (75 mLs Intravenous Contrast Given 05/12/23 1751)    ED Course/ Medical Decision Making/ A&P                                 Medical Decision Making Amount and/or Complexity of Data Reviewed Labs: ordered.  Risk OTC drugs. Prescription drug management.   Patient states that she had rectal fullness prior to having a very large bowel movement before CT scan.  She states that she also had a large amount of urine output ready for CT scan.  I suspect that the proctitis seen on CT scan was likely stercoral colitis related to a mild fecal impaction but it resolved itself and just left with inflammation.  White count is 10.9 but no fever no other signs of endorgan damage or sepsis.  Will initiate antibiotics, stool softeners.  She will follow-up with her GI doctor and PCP for further management.  Also discussed probiotics and fiber to try to reduce chances of this happening again.  No evidence of urine infection.  Final Clinical Impression(s) / ED Diagnoses Final diagnoses:  Constipation, unspecified constipation type  Proctitis    Rx / DC Orders ED Discharge Orders          Ordered    ciprofloxacin (CIPRO) 500 MG tablet  2 times daily        05/13/23 0412    metroNIDAZOLE (FLAGYL) 500 MG tablet  2 times daily        05/13/23 0412    promethazine (PHENERGAN) 25 MG tablet  Every 8 hours PRN        05/13/23 0412    docusate  sodium (COLACE) 100 MG capsule  Every 12 hours        05/13/23 0412              Undrea Shipes, Barbara Cower, MD 05/13/23 [phone]

## 2023-05-14 ENCOUNTER — Ambulatory Visit
Admission: RE | Admit: 2023-05-14 | Discharge: 2023-05-14 | Disposition: A | Discharge status: Home / Self Care | Payer: Medicare Other | Source: Ambulatory Visit | Attending: Student | Admitting: Student

## 2023-05-14 DIAGNOSIS — K7689 Other specified diseases of liver: Secondary | ICD-10-CM | POA: Diagnosis not present

## 2023-05-14 DIAGNOSIS — R110 Nausea: Secondary | ICD-10-CM

## 2023-05-19 ENCOUNTER — Other Ambulatory Visit: Payer: Self-pay | Admitting: Internal Medicine

## 2023-05-19 DIAGNOSIS — M5386 Other specified dorsopathies, lumbar region: Secondary | ICD-10-CM

## 2023-06-02 ENCOUNTER — Ambulatory Visit (INDEPENDENT_AMBULATORY_CARE_PROVIDER_SITE_OTHER): Payer: Medicare Other | Admitting: *Deleted

## 2023-06-02 DIAGNOSIS — E119 Type 2 diabetes mellitus without complications: Secondary | ICD-10-CM | POA: Diagnosis not present

## 2023-06-02 DIAGNOSIS — J309 Allergic rhinitis, unspecified: Secondary | ICD-10-CM

## 2023-06-02 LAB — HM DIABETES EYE EXAM

## 2023-06-03 DIAGNOSIS — J302 Other seasonal allergic rhinitis: Secondary | ICD-10-CM | POA: Diagnosis not present

## 2023-06-03 DIAGNOSIS — S92515A Nondisplaced fracture of proximal phalanx of left lesser toe(s), initial encounter for closed fracture: Secondary | ICD-10-CM | POA: Diagnosis not present

## 2023-06-04 ENCOUNTER — Ambulatory Visit: Payer: Medicare Other | Attending: Internal Medicine | Admitting: Internal Medicine

## 2023-06-04 ENCOUNTER — Encounter: Payer: Self-pay | Admitting: Internal Medicine

## 2023-06-04 DIAGNOSIS — G4733 Obstructive sleep apnea (adult) (pediatric): Secondary | ICD-10-CM

## 2023-06-04 DIAGNOSIS — E1159 Type 2 diabetes mellitus with other circulatory complications: Secondary | ICD-10-CM | POA: Diagnosis not present

## 2023-06-04 DIAGNOSIS — E1169 Type 2 diabetes mellitus with other specified complication: Secondary | ICD-10-CM

## 2023-06-04 DIAGNOSIS — Z713 Dietary counseling and surveillance: Secondary | ICD-10-CM | POA: Diagnosis not present

## 2023-06-04 DIAGNOSIS — Z7985 Long-term (current) use of injectable non-insulin antidiabetic drugs: Secondary | ICD-10-CM | POA: Diagnosis not present

## 2023-06-04 DIAGNOSIS — I152 Hypertension secondary to endocrine disorders: Secondary | ICD-10-CM

## 2023-06-04 DIAGNOSIS — K760 Fatty (change of) liver, not elsewhere classified: Secondary | ICD-10-CM

## 2023-06-04 DIAGNOSIS — E785 Hyperlipidemia, unspecified: Secondary | ICD-10-CM | POA: Insufficient documentation

## 2023-06-04 DIAGNOSIS — I10 Essential (primary) hypertension: Secondary | ICD-10-CM | POA: Diagnosis not present

## 2023-06-04 DIAGNOSIS — J4540 Moderate persistent asthma, uncomplicated: Secondary | ICD-10-CM

## 2023-06-04 DIAGNOSIS — Z79899 Other long term (current) drug therapy: Secondary | ICD-10-CM | POA: Diagnosis not present

## 2023-06-04 DIAGNOSIS — Z6841 Body Mass Index (BMI) 40.0 and over, adult: Secondary | ICD-10-CM | POA: Insufficient documentation

## 2023-06-04 DIAGNOSIS — E66813 Obesity, class 3: Secondary | ICD-10-CM | POA: Diagnosis not present

## 2023-06-04 DIAGNOSIS — J3089 Other allergic rhinitis: Secondary | ICD-10-CM | POA: Diagnosis not present

## 2023-06-04 LAB — GLUCOSE, POCT (MANUAL RESULT ENTRY): POC Glucose: 126 mg/dL — AB (ref 70–99)

## 2023-06-04 LAB — POCT GLYCOSYLATED HEMOGLOBIN (HGB A1C): HbA1c, POC (controlled diabetic range): 6.2 % (ref 0.0–7.0)

## 2023-06-04 MED ORDER — SEMAGLUTIDE(0.25 OR 0.5MG/DOS) 2 MG/3ML ~~LOC~~ SOPN: 0.2500 mg | PEN_INJECTOR | SUBCUTANEOUS | 1 refills | Status: DC

## 2023-06-04 MED ORDER — LISINOPRIL 10 MG PO TABS: 10.0000 mg | ORAL_TABLET | Freq: Every day | ORAL | 1 refills | Status: DC

## 2023-06-04 NOTE — Patient Instructions (Signed)
 Take the Ozempic once a week.  Please let us know if you develop any vomiting, abdominal pain, severe diarrhea or constipation.

## 2023-06-04 NOTE — Progress Notes (Signed)
 VIALS EXP 06-03-24

## 2023-06-04 NOTE — Progress Notes (Signed)
 Patient ID: Cheryl [name], female    DOB: [birth year]  MRN: [id number]  CC: Follow-up (ER f/u. Med refills. //Already received flu vax. )   Subjective: Cheryl [name] is a 68 y.o. female who presents for chronic ds management. Her concerns today include:  Pt with hx of DM type 2, HTN, HL, moderate persistent asthma, OSA on CPAP, morbid obesity, arthritis/LBP, chronic pelvic pain syndrome/pelvic floor dysfunction followed by urology Dr. Octavio   Patient seen in the emergency room last month with abdominal pain and some urinary retention.  CAT scan of the abdomen showed some inflammation in the rectum consistent with rectal proctitis and fatty liver on US  (US  was ordered by her GI at [facility]).  She was prescribed 2 antibiotics.  Abdominal pain better post abx. Also taking Benefiber.  DM: Results for orders placed or performed in visit on 06/04/23  POCT glucose (manual entry)   Collection Time: 06/04/23  2:29 PM  Result Value Ref Range   POC Glucose 126 (A) 70 - 99 mg/dl  POCT glycosylated hemoglobin (Hb A1C)   Collection Time: 06/04/23  5:49 PM  Result Value Ref Range   Hemoglobin A1C     HbA1c POC (<> result, manual entry)     HbA1c, POC (prediabetic range)     HbA1c, POC (controlled diabetic range) 6.2 0.0 - 7.0 %  A1C 6.2 Patient is diet controlled. Did well with eating habits over the holidays.   Rides stationary bike for 10 mins daily Weight is not changing. Had eye exam this wk with Dr. Abigail  HTN: Currently on Maxide 75/50 mg daily and lisinopril  10 mg daily, took already today Checks BP about 1x/wk but not in a while. Has wrist device She limits salt in the foods.  HL: Taking and tolerating simvastatin  20 mg daily.  OSA: Saw Dr. Neysa in 01/2023.Using CPAP every night and feels she benefits from use.  Asthma: followed by Dr. Brannon. Reports her asthma is doing well on Symbicort .  Rarely has to use Albuterol   Patient Active Problem List   Diagnosis Date Noted    Hyperlipidemia associated with type 2 diabetes mellitus (HCC) 01/25/2021   Type 2 diabetes mellitus with morbid obesity (HCC) 01/25/2021   Moderate persistent asthma, uncomplicated 11/04/2018   OSA (obstructive sleep apnea) 12/02/2016   Hyperlipidemia LDL goal <100 10/21/2014   Essential hypertension, benign 10/21/2014   DM w/o complication type II (HCC) 09/13/2014   Routine general medical examination at a health care facility 10/12/2013   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 05/03/2013   Need for prophylactic vaccination and inoculation against influenza 02/08/2013   GERD (gastroesophageal reflux disease) 02/08/2013   Other and unspecified hyperlipidemia 11/10/2012   Type II or unspecified type diabetes mellitus without mention of complication, not stated as uncontrolled 10/25/2012   Impaired fasting glucose 09/07/2012   Morbid obesity (HCC) 09/07/2012   Generalized osteoarthrosis, involving multiple sites 09/07/2012   Carpal tunnel syndrome 09/07/2012   Other, multiple, and unspecified sites, insect bite, nonvenomous, without mention of infection(919.4) 09/07/2012   Special screening for malignant neoplasms, colon 09/07/2012   Other specified cardiac dysrhythmias(427.89) 09/07/2012   Candidiasis of vulva and vagina 09/07/2012   Abdominal pain, generalized 09/07/2012   Symptomatic menopausal or female climacteric states 09/07/2012   Hypertension associated with diabetes (HCC) 09/07/2012   Perennial allergic rhinitis 09/07/2012   Pain in joint 09/07/2012   Other malaise and fatigue 09/07/2012     Current Outpatient Medications on File Prior  to Visit  Medication Sig Dispense Refill   acetaminophen  (TYLENOL ) 500 MG tablet Take 500 mg by mouth daily.     albuterol  (VENTOLIN  HFA) 108 (90 Base) MCG/ACT inhaler INHALE 2 PUFFS INTO THE LUNGS UP TO EVERY 6HRS AS NEEDED FOR WHEEZING/SHORTNESS OF BREATH 18 each 1   AMBULATORY NON FORMULARY MEDICATION Medication Name: Allergy   Injection- every 4 weeks  I take it once a week 01/14/23     aspirin 81 MG tablet Take 81 mg by mouth daily.     azelastine  (ASTELIN ) 0.1 % nasal spray Place 2 sprays into both nostrils 2 (two) times daily. Use in each nostril as directed 30 mL 5   B-D ULTRA-FINE 33 LANCETS MISC Use to test blood sugar once daily. Dx: E11.9 100 each 6   budesonide -formoterol  (SYMBICORT ) 160-4.5 MCG/ACT inhaler TAKE 2 PUFFS BY MOUTH TWICE A DAY 30.6 each 5   Calcium  Carbonate-Vitamin D  600-400 MG-UNIT chew tablet Chew 1 tablet by mouth daily.      docusate sodium  (COLACE) 100 MG capsule Take 1 capsule (100 mg total) by mouth every 12 (twelve) hours. 60 capsule 0   EPINEPHRINE  0.3 mg/0.3 mL IJ SOAJ injection INJECT 0.3 MG INTO THE MUSCLE AS NEEDED FOR ANAPHYLAXIS. 2 each 1   fluticasone  (FLONASE ) 50 MCG/ACT nasal spray Place 1-2 sprays into both nostrils daily. 48 mL 2   gabapentin  (NEURONTIN ) 300 MG capsule TAKE 1 CAPSULE BY MOUTH EVERYDAY AT BEDTIME 90 capsule 1   levocetirizine (XYZAL ) 5 MG tablet TAKE 1 TABLET BY MOUTH EVERY DAY IN THE EVENING 90 tablet 1   Multiple Vitamin (MULTIVITAMIN ADULT PO)      nabumetone (RELAFEN) 500 MG tablet Take 500 mg by mouth daily. PRN     omeprazole  (PRILOSEC) 40 MG capsule TAKE 1 CAPSULE (40 MG TOTAL) BY MOUTH DAILY. 90 capsule 0   promethazine  (PHENERGAN ) 25 MG tablet Take 0.5 tablets (12.5 mg total) by mouth every 8 (eight) hours as needed for nausea or vomiting. 30 tablet 0   Respiratory Therapy Supplies (CARETOUCH 2 CPAP HOSE HANGER) MISC      simvastatin  (ZOCOR ) 20 MG tablet TAKE 1 TABLET (20 MG) BY ORAL ROUTE ONCE DAILY IN THE EVENING FOR 90 DAYS 90 tablet 3   sodium fluoride (DENTA 5000 PLUS) 1.1 % CREA dental cream Place 1 Application onto teeth at bedtime.     Spacer/Aero-Holding Chambers DEVI 1 each by Does not apply route as needed. 1 each 0   triamterene -hydrochlorothiazide (MAXZIDE) 75-50 MG tablet TAKE 1 TABLET BY MOUTH EVERY DAY 90 tablet 0   vitamin C  (ASCORBIC ACID) 500 MG tablet Take 500 mg by mouth every other day.     No current facility-administered medications on file prior to visit.    Allergies  Allergen Reactions   Misc. Sulfonamide Containing Compounds Diarrhea   Elemental Sulfur Diarrhea   Oxycodone Nausea Only and Other (See Comments)    Had stomach and headache as side effect from medicine    Social History   Socioeconomic History   Marital status: Divorced    Spouse name: Not on file   Number of children: Not on file   Years of education: Not on file   Highest education level: Not on file  Occupational History   Not on file  Tobacco Use   Smoking status: Never   Smokeless tobacco: Never  Vaping Use   Vaping status: Never Used  Substance and Sexual Activity   Alcohol use: Yes  Comment: Seldom- Wine    Drug use: No   Sexual activity: Not Currently    Comment: post office worker  Other Topics Concern   Not on file  Social History Narrative   Not on file   Social Drivers of Health   Financial Resource Strain: Low Risk  (03/10/2023)   Overall Financial Resource Strain (CARDIA)    Difficulty of Paying Living Expenses: Not hard at all  Food Insecurity: No Food Insecurity (03/10/2023)   Hunger Vital Sign    Worried About Running Out of Food in the Last Year: Never true    Ran Out of Food in the Last Year: Never true  Transportation Needs: No Transportation Needs (03/10/2023)   PRAPARE - Administrator, Civil Service (Medical): No    Lack of Transportation (Non-Medical): No  Physical Activity: Insufficiently Active (03/10/2023)   Exercise Vital Sign    Days of Exercise per Week: 3 days    Minutes of Exercise per Session: 30 min  Stress: No Stress Concern Present (03/10/2023)   [employer] of Occupational Health - Occupational Stress Questionnaire    Feeling of Stress : Only a little  Social Connections: Socially Isolated (03/10/2023)   Social Connection and Isolation Panel  [NHANES]    Frequency of Communication with Friends and Family: More than three times a week    Frequency of Social Gatherings with Friends and Family: Never    Attends Religious Services: Never    Database Administrator or Organizations: No    Attends Banker Meetings: Never    Marital Status: Never married  Intimate Partner Violence: Not At Risk (03/10/2023)   Humiliation, Afraid, Rape, and Kick questionnaire    Fear of Current or Ex-Partner: No    Emotionally Abused: No    Physically Abused: No    Sexually Abused: No    Family History  Problem Relation Age of Onset   Cancer Father    Diabetes Father    Allergic rhinitis Son    Diabetes Brother    Allergic rhinitis Son    Diabetes Paternal Aunt    Lung cancer Cousin    Asthma Neg Hx     Past Surgical History:  Procedure Laterality Date   ABDOMINAL HYSTERECTOMY     1994   carpal tunnel both hands     2012   CLOSED MANIPULATION SHOULDER Left 04/2014   EYE SURGERY Left 08/25/2015   EYE SURGERY Right 09/24/2015   JOINT REPLACEMENT     both knees replacement, 2010   mole removed from face     Nodule removed from back     SHOULDER SURGERY Left 11/2013   TONSILLECTOMY     as teenager    ROS: Review of Systems Negative except as stated above  PHYSICAL EXAM: BP 120/70   Pulse 64   Temp 98.2 F (36.8 C) (Oral)   Ht 5' 8 (1.727 m)   Wt 292 lb (132.5 kg)   SpO2 98%   BMI 44.40 kg/m   Wt Readings from Last 3 Encounters:  06/04/23 292 lb (132.5 kg)  05/12/23 295 lb 6.7 oz (134 kg)  04/30/23 296 lb 8 oz (134.5 kg)    Physical Exam  General appearance - alert, well appearing, morbidly obese older African-American female and in no distress Mental status - normal mood, behavior, speech, dress, motor activity, and thought processes Neck - supple, no significant adenopathy Chest - clear to auscultation, no wheezes, rales or rhonchi,  symmetric air entry Heart -soft systolic ejection murmur left upper  sternal border.  Regular rate rhythm.   Extremities - peripheral pulses normal, no pedal edema, no clubbing or cyanosis      Latest Ref Rng & Units 05/12/2023    3:14 PM 02/03/2023    2:22 PM 01/01/2023   10:08 AM  CMP  Glucose 70 - 99 mg/dL 867   885   BUN 8 - 23 mg/dL 15   18   Creatinine 9.55 - 1.00 mg/dL 9.15   9.18   Sodium 864 - 145 mmol/L 135   142   Potassium 3.5 - 5.1 mmol/L 4.4   3.9   Chloride 98 - 111 mmol/L 96   103   CO2 22 - 32 mmol/L 24   23   Calcium  8.9 - 10.3 mg/dL 9.8   89.8   Total Protein 6.5 - 8.1 g/dL 7.5  7.2    Total Bilirubin <1.2 mg/dL 0.6  <9.7    Alkaline Phos 38 - 126 U/L 56  76    AST 15 - 41 U/L 31  18    ALT 0 - 44 U/L 18  16     Lipid Panel     Component Value Date/Time   CHOL 173 02/03/2023 1422   TRIG 128 02/03/2023 1422   HDL 61 02/03/2023 1422   CHOLHDL 2.8 02/03/2023 1422   CHOLHDL 3.0 07/23/2018 1045   VLDL 20 11/17/2016 1007   LDLCALC 90 02/03/2023 1422   LDLCALC 86 07/23/2018 1045    CBC    Component Value Date/Time   WBC 10.9 (H) 05/12/2023 1514   RBC 4.99 05/12/2023 1514   HGB 14.1 05/12/2023 1514   HGB 13.7 02/03/2023 1422   HCT 41.7 05/12/2023 1514   HCT 41.1 02/03/2023 1422   PLT 353 05/12/2023 1514   PLT 374 02/03/2023 1422   MCV 83.6 05/12/2023 1514   MCV 86 02/03/2023 1422   MCH 28.3 05/12/2023 1514   MCHC 33.8 05/12/2023 1514   RDW 14.7 05/12/2023 1514   RDW 14.4 02/03/2023 1422   LYMPHSABS 1.4 05/12/2023 1514   LYMPHSABS 2.3 07/29/2018 1443   MONOABS 0.8 05/12/2023 1514   EOSABS 0.1 05/12/2023 1514   EOSABS 0.2 07/29/2018 1443   BASOSABS 0.1 05/12/2023 1514   BASOSABS 0.0 07/29/2018 1443    ASSESSMENT AND PLAN: 1. Type 2 diabetes mellitus with morbid obesity (HCC) (Primary) Diet control.  Encouraged her to continue trying to eat healthy and to move as much as she can. We discussed trying her with Ozempic  to assist with weight loss.  I went over with her how the medication works and potential side  effects.  Advised that the medicine can cause nausea, vomiting, pancreatitis, bowel blockage, severe constipation/diarrhea.  Advised to stop the medicine and be seen if she develops any vomiting, pain in the upper abdomen, vomiting with pain in the upper abdomen or severe diarrhea.  We will have the pharmacist downstairs go over with her how to administer the Ozempic .  We will start with the 0.25 mg once a week.  Will have her follow-up in 1 month with the clinical pharmacist to see how she is doing.  If she is tolerating it, we can increase to the 0.5 mg. - POCT glycosylated hemoglobin (Hb A1C) - POCT glucose (manual entry) - Semaglutide ,0.25 or 0.5MG /DOS, 2 MG/3ML SOPN; Inject 0.25 mg into the skin once a week.  Dispense: 3 mL; Refill: 1  2. Fatty infiltration of  liver Discussed the importance of weight loss.  3. Hypertension associated with diabetes (HCC) At goal.  Continue Hyzaar and lisinopril  - lisinopril  (ZESTRIL ) 10 MG tablet; Take 1 tablet (10 mg total) by mouth daily.  Dispense: 90 tablet; Refill: 1  4. Hyperlipidemia associated with type 2 diabetes mellitus (HCC) Continue Zocor .  5. OSA on CPAP Encouraged her to continue compliance with using CPAP.  She feels she benefits from use.  6. Moderate persistent asthma, uncomplicated Controlled on Symbicort .    Patient was given the opportunity to ask questions.  Patient verbalized understanding of the plan and was able to repeat key elements of the plan.   This documentation was completed using Paediatric nurse.  Any transcriptional errors are unintentional.  Orders Placed This Encounter  Procedures   POCT glycosylated hemoglobin (Hb A1C)   POCT glucose (manual entry)     Requested Prescriptions   Signed Prescriptions Disp Refills   lisinopril  (ZESTRIL ) 10 MG tablet 90 tablet 1    Sig: Take 1 tablet (10 mg total) by mouth daily.   Semaglutide ,0.25 or 0.5MG /DOS, 2 MG/3ML SOPN 3 mL 1    Sig: Inject 0.25  mg into the skin once a week.    Return in about 4 months (around 10/02/2023) for Appt with Herlene in 4 wks for DM/obesity - see if she is tolerating Ozempic .  Barnie Louder, MD, [facility]

## 2023-06-14 ENCOUNTER — Other Ambulatory Visit: Payer: Self-pay | Admitting: Internal Medicine

## 2023-06-30 NOTE — Progress Notes (Addendum)
    S:     DM f/u, Ozempic  f/u  68 y.o. female who presents for diabetes evaluation, education, and management.  Patient was referred and last seen by Primary Care Provider, Dr. Barnie Louder, on 06/04/23.   PMH is significant for T2DM, HTN, HLD, moderate persistent asthma, OSA on CPAP, morbid obesity, arthritis/LBP, chronic pelvic pain syndrome/pelvic floor dysfunction followed by urology Dr. Octavio.   At last visit, patient's A1c was controlled at 6.2 and diet was also reported as controlled. Patient was started on Ozempic  0.25mg  subcutaneous weekly. Patient was counseled on potential side effects and administration.  Current diabetes medications include: Ozempic  0.25mg  subcutaneous weekly. Previously on metformin , but d/c due to T2DM being controlled.  Current hypertension medications include: lisinopril  10mg  PO daily Current hyperlipidemia medications include: simvastatin  20mg  PO daily  Patient reports adherence to taking all medications as prescribed.   Do you feel that your medications are working for you? yes Have you been experiencing any side effects to the medications prescribed? no Do you have any problems obtaining medications due to transportation or finances? no  Patient denies hypoglycemic events.  Reported home blood sugars: 90-130  Patient reports nocturia (nighttime urination). Patient reports drinking a lot of water before bed. Patient denies neuropathy (nerve pain). Patient denies visual changes. Patient denies self foot exams.   Patient-reported exercise habits: walking and using a stationary bike multiple times a week.    O:  Lab Results  Component Value Date   HGBA1C 6.2 06/04/2023   There were no vitals filed for this visit.  Lipid Panel     Component Value Date/Time   CHOL 173 02/03/2023 1422   TRIG 128 02/03/2023 1422   HDL 61 02/03/2023 1422   CHOLHDL 2.8 02/03/2023 1422   CHOLHDL 3.0 07/23/2018 1045   VLDL 20 11/17/2016 1007   LDLCALC  90 02/03/2023 1422   LDLCALC 86 07/23/2018 1045   Clinical Atherosclerotic Cardiovascular Disease (ASCVD): No  The 10-year ASCVD risk score (Arnett DK, et al., 2019) is: 19.9%   Values used to calculate the score:     Age: 1 years     Sex: Female     Is Non-Hispanic African American: Yes     Diabetic: Yes     Tobacco smoker: No     Systolic Blood Pressure: 120 mmHg     Is BP treated: Yes     HDL Cholesterol: 61 mg/dL     Total Cholesterol: 173 mg/dL   A/P: U7IF: U7IF is controlled with an A1c at 6.2%. A1c lab due in April 2025. -Increased dose of GLP-1 Ozempic  (semaglutide ) to 0.5 mg subcutaneous weekly.   HLD:  LDL is uncontrolled and above goal (<70 mg/dL) at 90 mg/dL.  - Discontinue simvastatin  20mg   - Start rosuvastatin  20mg  PO daily   Follow-up: 5 weeks with Cheryl + lipid panel and A1c   Cheryl [name], Student Pharmacist [facility]  Cheryl [name], PharmD, [place], CPP Clinical Pharmacist [facility] [phone]

## 2023-07-02 ENCOUNTER — Ambulatory Visit: Payer: Medicare Other | Attending: Internal Medicine | Admitting: Pharmacist

## 2023-07-02 ENCOUNTER — Ambulatory Visit (INDEPENDENT_AMBULATORY_CARE_PROVIDER_SITE_OTHER): Payer: Medicare Other

## 2023-07-02 ENCOUNTER — Encounter: Payer: Self-pay | Admitting: Pharmacist

## 2023-07-02 DIAGNOSIS — Z7985 Long-term (current) use of injectable non-insulin antidiabetic drugs: Secondary | ICD-10-CM | POA: Diagnosis not present

## 2023-07-02 DIAGNOSIS — E119 Type 2 diabetes mellitus without complications: Secondary | ICD-10-CM | POA: Insufficient documentation

## 2023-07-02 DIAGNOSIS — I10 Essential (primary) hypertension: Secondary | ICD-10-CM | POA: Insufficient documentation

## 2023-07-02 DIAGNOSIS — J309 Allergic rhinitis, unspecified: Secondary | ICD-10-CM | POA: Diagnosis not present

## 2023-07-02 DIAGNOSIS — E785 Hyperlipidemia, unspecified: Secondary | ICD-10-CM | POA: Insufficient documentation

## 2023-07-02 DIAGNOSIS — Z79899 Other long term (current) drug therapy: Secondary | ICD-10-CM | POA: Diagnosis not present

## 2023-07-02 DIAGNOSIS — G8929 Other chronic pain: Secondary | ICD-10-CM | POA: Insufficient documentation

## 2023-07-02 DIAGNOSIS — J4540 Moderate persistent asthma, uncomplicated: Secondary | ICD-10-CM | POA: Insufficient documentation

## 2023-07-02 DIAGNOSIS — E1169 Type 2 diabetes mellitus with other specified complication: Secondary | ICD-10-CM | POA: Diagnosis not present

## 2023-07-02 DIAGNOSIS — R102 Pelvic and perineal pain: Secondary | ICD-10-CM | POA: Insufficient documentation

## 2023-07-02 DIAGNOSIS — G4733 Obstructive sleep apnea (adult) (pediatric): Secondary | ICD-10-CM | POA: Diagnosis not present

## 2023-07-02 MED ORDER — ROSUVASTATIN CALCIUM 20 MG PO TABS: 20.0000 mg | ORAL_TABLET | Freq: Every day | ORAL | 1 refills | Status: DC

## 2023-07-02 MED ORDER — SEMAGLUTIDE (1 MG/DOSE) 4 MG/3ML ~~LOC~~ SOPN: 1.0000 mg | PEN_INJECTOR | SUBCUTANEOUS | 1 refills | Status: DC

## 2023-07-02 MED ORDER — SEMAGLUTIDE(0.25 OR 0.5MG/DOS) 2 MG/3ML ~~LOC~~ SOPN: 0.5000 mg | PEN_INJECTOR | SUBCUTANEOUS | 0 refills | Status: DC

## 2023-07-15 ENCOUNTER — Other Ambulatory Visit: Payer: Self-pay | Admitting: Internal Medicine

## 2023-07-15 ENCOUNTER — Other Ambulatory Visit: Payer: Self-pay | Admitting: Allergy & Immunology

## 2023-07-22 ENCOUNTER — Other Ambulatory Visit: Payer: Self-pay | Admitting: Allergy & Immunology

## 2023-07-27 ENCOUNTER — Ambulatory Visit (INDEPENDENT_AMBULATORY_CARE_PROVIDER_SITE_OTHER): Admitting: *Deleted

## 2023-07-27 DIAGNOSIS — J309 Allergic rhinitis, unspecified: Secondary | ICD-10-CM

## 2023-08-06 ENCOUNTER — Encounter: Payer: Self-pay | Admitting: Pharmacist

## 2023-08-06 ENCOUNTER — Ambulatory Visit: Payer: Medicare Other | Attending: Internal Medicine | Admitting: Pharmacist

## 2023-08-06 DIAGNOSIS — E1169 Type 2 diabetes mellitus with other specified complication: Secondary | ICD-10-CM | POA: Diagnosis not present

## 2023-08-06 DIAGNOSIS — I10 Essential (primary) hypertension: Secondary | ICD-10-CM | POA: Diagnosis not present

## 2023-08-06 DIAGNOSIS — E785 Hyperlipidemia, unspecified: Secondary | ICD-10-CM | POA: Diagnosis not present

## 2023-08-06 DIAGNOSIS — E119 Type 2 diabetes mellitus without complications: Secondary | ICD-10-CM | POA: Insufficient documentation

## 2023-08-06 DIAGNOSIS — G4733 Obstructive sleep apnea (adult) (pediatric): Secondary | ICD-10-CM | POA: Diagnosis not present

## 2023-08-06 DIAGNOSIS — Z7985 Long-term (current) use of injectable non-insulin antidiabetic drugs: Secondary | ICD-10-CM

## 2023-08-06 DIAGNOSIS — J4540 Moderate persistent asthma, uncomplicated: Secondary | ICD-10-CM | POA: Insufficient documentation

## 2023-08-06 DIAGNOSIS — Z79899 Other long term (current) drug therapy: Secondary | ICD-10-CM | POA: Insufficient documentation

## 2023-08-06 NOTE — Progress Notes (Signed)
 S:     No chief complaint on file.  68 y.o. female who presents for diabetes evaluation, education, and management. Patient arrives in good spirits and presents without any assistance.   Patient was referred and last seen by Primary Care Provider, Dr. Laural Benes, on 06/04/2023. Pharmacy saw her on 07/02/2023 and adjusted her Ozempic. We also changed her simvastatin to rosuvastatin. Of note, we are titrating Ozempic more for weight loss benefit. Her diabetes is well-controlled with her last A1c of 6.2%.   PMH is significant for T2DM, HTN, HLD, moderate persistent asthma, OSA on CPAP, morbid obesity, arthritis/LBP, chronic pelvic pain syndrome/pelvic floor dysfunction followed by urology Dr. Robby Sermon.  Today, patient reports doing well. Denies any changes in vision. No NV or abdominal pain. Has complete 4 doses of the Ozempic 0.5 mg weekly dose.    Family/Social History:  Fhx: DM Tobacco: never smoker   Alcohol: none reported   Current diabetes medications include: Ozempic 0.5mg  subcutaneous weekly Current hypertension medications include: lisinopril 10mg  PO daily, trriamterene-hydrochlorothiazide 75-50 mg daily Current hyperlipidemia medications include: rosuvastatin 20mg  PO daily   Patient reports adherence to taking all medications as prescribed.   Insurance coverage: Medicare  Patient denies hypoglycemic events.  Reported home fasting blood sugars: 90s - 110s   Patient denies polyuria. Patient denies neuropathy (nerve pain). Patient denies visual changes. Patient reports self foot exams.   Patient reported dietary habits:  -Notes a decreased appetite since increasing   Patient-reported exercise habits: 10 minutes every day/counts 5,000 steps daily   O:  Lab Results  Component Value Date   HGBA1C 6.2 06/04/2023   There were no vitals filed for this visit.  Lipid Panel     Component Value Date/Time   CHOL 173 02/03/2023 1422   TRIG 128 02/03/2023 1422   HDL 61  02/03/2023 1422   CHOLHDL 2.8 02/03/2023 1422   CHOLHDL 3.0 07/23/2018 1045   VLDL 20 11/17/2016 1007   LDLCALC 90 02/03/2023 1422   LDLCALC 86 07/23/2018 1045    Clinical Atherosclerotic Cardiovascular Disease (ASCVD): No  The 10-year ASCVD risk score (Arnett DK, et al., 2019) is: 19.9%   Values used to calculate the score:     Age: 79 years     Sex: Female     Is Non-Hispanic African American: Yes     Diabetic: Yes     Tobacco smoker: No     Systolic Blood Pressure: 120 mmHg     Is BP treated: Yes     HDL Cholesterol: 61 mg/dL     Total Cholesterol: 173 mg/dL   Patient is participating in a Managed Medicaid Plan: No   A/P: Diabetes longstanding currently well controlled. She is tolerating Ozempic well and we are increasing dose to maximize weight loss benefit. Weight today is down to 284 lbs (down from 292 lbs on 06/04/2023). Patient is not symptomatic at this time and is able to verbalize appropriate hypoglycemia management plan. Medication adherence appears to be optimal. -Increased dose of Ozempic to 1 mg weekly. -Patient educated on purpose, proper use, and potential adverse effects of Ozempic.  -Extensively discussed pathophysiology of diabetes, recommended lifestyle interventions, dietary effects on blood sugar control.  -Counseled on s/sx of and management of hypoglycemia.  -Next A1c anticipated 08/2023.   Written patient instructions provided. Patient verbalized understanding of treatment plan.  Total time in face to face counseling 20 minutes.    Follow-up:  Pharmacist in 1 month  Butch Penny, PharmD, [place],  CPP Clinical Pharmacist [facility] [phone]

## 2023-08-07 DIAGNOSIS — L84 Corns and callosities: Secondary | ICD-10-CM | POA: Diagnosis not present

## 2023-08-07 DIAGNOSIS — E1142 Type 2 diabetes mellitus with diabetic polyneuropathy: Secondary | ICD-10-CM | POA: Diagnosis not present

## 2023-08-07 DIAGNOSIS — E1151 Type 2 diabetes mellitus with diabetic peripheral angiopathy without gangrene: Secondary | ICD-10-CM | POA: Diagnosis not present

## 2023-08-07 DIAGNOSIS — L600 Ingrowing nail: Secondary | ICD-10-CM | POA: Diagnosis not present

## 2023-08-07 DIAGNOSIS — I739 Peripheral vascular disease, unspecified: Secondary | ICD-10-CM | POA: Diagnosis not present

## 2023-08-23 ENCOUNTER — Other Ambulatory Visit: Payer: Self-pay | Admitting: Internal Medicine

## 2023-08-23 DIAGNOSIS — E1169 Type 2 diabetes mellitus with other specified complication: Secondary | ICD-10-CM

## 2023-08-25 MED ORDER — SEMAGLUTIDE (1 MG/DOSE) 4 MG/3ML ~~LOC~~ SOPN: 1.0000 mg | PEN_INJECTOR | SUBCUTANEOUS | 1 refills | Status: DC

## 2023-09-01 ENCOUNTER — Ambulatory Visit (INDEPENDENT_AMBULATORY_CARE_PROVIDER_SITE_OTHER): Payer: Self-pay

## 2023-09-01 DIAGNOSIS — J309 Allergic rhinitis, unspecified: Secondary | ICD-10-CM | POA: Diagnosis not present

## 2023-09-07 ENCOUNTER — Ambulatory Visit (INDEPENDENT_AMBULATORY_CARE_PROVIDER_SITE_OTHER): Payer: Self-pay

## 2023-09-07 DIAGNOSIS — J309 Allergic rhinitis, unspecified: Secondary | ICD-10-CM | POA: Diagnosis not present

## 2023-09-10 ENCOUNTER — Encounter: Payer: Self-pay | Admitting: Pharmacist

## 2023-09-10 ENCOUNTER — Ambulatory Visit: Attending: Internal Medicine | Admitting: Pharmacist

## 2023-09-10 DIAGNOSIS — Z7985 Long-term (current) use of injectable non-insulin antidiabetic drugs: Secondary | ICD-10-CM | POA: Diagnosis not present

## 2023-09-10 DIAGNOSIS — E785 Hyperlipidemia, unspecified: Secondary | ICD-10-CM | POA: Diagnosis not present

## 2023-09-10 DIAGNOSIS — E1169 Type 2 diabetes mellitus with other specified complication: Secondary | ICD-10-CM | POA: Insufficient documentation

## 2023-09-10 LAB — POCT GLYCOSYLATED HEMOGLOBIN (HGB A1C): HbA1c, POC (controlled diabetic range): 5.9 % (ref 0.0–7.0)

## 2023-09-10 NOTE — Progress Notes (Signed)
    S:     No chief complaint on file.  68 y.o. female who presents for diabetes evaluation, education, and management. Patient arrives in good spirits and presents without any assistance.   Patient was referred and last seen by Primary Care Provider, Dr. Laural Benes, on 06/04/2023. Pharmacy saw her on 08/06/2023 and adjusted her Ozempic. Of note, we are titrating Ozempic more for weight loss benefit. Her diabetes is well-controlled with her last A1c of 6.2%.   PMH is significant for T2DM, HTN, HLD, moderate persistent asthma, OSA on CPAP, morbid obesity, arthritis/LBP, chronic pelvic pain syndrome/pelvic floor dysfunction followed by urology Dr. Robby Sermon.  Today, patient reports doing well. Denies any changes in vision. No NV or abdominal pain. She continues to take the 0.5 mg weekly dose of Ozempic.   Family/Social History:  Fhx: DM Tobacco: never smoker   Alcohol: none reported   Current diabetes medications include: Ozempic 1mg  subcutaneous weekly Current hypertension medications include: lisinopril 10mg  PO daily, trriamterene-hydrochlorothiazide 75-50 mg daily Current hyperlipidemia medications include: rosuvastatin 20mg  PO daily   Patient reports adherence to taking all medications as prescribed.   Insurance coverage: Medicare  Patient denies hypoglycemic events.  Patient denies polyuria. Patient denies neuropathy (nerve pain). Patient denies visual changes. Patient reports self foot exams.   Patient reported dietary habits:  -Notes a decreased appetite since increasing   Patient-reported exercise habits: 10 minutes every day/counts 10,000 steps daily   O:  Lab Results  Component Value Date   HGBA1C 5.9 09/10/2023   There were no vitals filed for this visit.  Lipid Panel     Component Value Date/Time   CHOL 173 02/03/2023 1422   TRIG 128 02/03/2023 1422   HDL 61 02/03/2023 1422   CHOLHDL 2.8 02/03/2023 1422   CHOLHDL 3.0 07/23/2018 1045   VLDL 20 11/17/2016 1007    LDLCALC 90 02/03/2023 1422   LDLCALC 86 07/23/2018 1045    Clinical Atherosclerotic Cardiovascular Disease (ASCVD): No  The 10-year ASCVD risk score (Arnett DK, et al., 2019) is: 19.9%   Values used to calculate the score:     Age: 26 years     Sex: Female     Is Non-Hispanic African American: Yes     Diabetic: Yes     Tobacco smoker: No     Systolic Blood Pressure: 120 mmHg     Is BP treated: Yes     HDL Cholesterol: 61 mg/dL     Total Cholesterol: 173 mg/dL   Patient is participating in a Managed Medicaid Plan: No   A/P: Diabetes longstanding currently well controlled. She is tolerating Ozempic well and we are increasing dose to maximize weight loss benefit. Patient is not symptomatic at this time and is able to verbalize appropriate hypoglycemia management plan. Medication adherence appears to be optimal. -Increased dose of Ozempic to 1 mg weekly. -Patient educated on purpose, proper use, and potential adverse effects of Ozempic.  -Extensively discussed pathophysiology of diabetes, recommended lifestyle interventions, dietary effects on blood sugar control.  -Counseled on s/sx of and management of hypoglycemia.  -Next A1c anticipated 11/2023.   Written patient instructions provided. Patient verbalized understanding of treatment plan.  Total time in face to face counseling 20 minutes.    Follow-up:  Pharmacist in 1 month  Butch Penny, PharmD, [place], CPP Clinical Pharmacist [facility] [phone]

## 2023-09-11 ENCOUNTER — Encounter: Payer: Self-pay | Admitting: Internal Medicine

## 2023-09-11 LAB — LIPID PANEL
Chol/HDL Ratio: 2.6 ratio (ref 0.0–4.4)
Cholesterol, Total: 142 mg/dL (ref 100–199)
HDL: 54 mg/dL (ref >39)
LDL Chol Calc (NIH): 67 mg/dL (ref 0–99)
Triglycerides: 121 mg/dL (ref 0–149)
VLDL Cholesterol Cal: 21 mg/dL (ref 5–40)

## 2023-09-14 ENCOUNTER — Other Ambulatory Visit: Payer: Self-pay | Admitting: Internal Medicine

## 2023-09-14 DIAGNOSIS — E1169 Type 2 diabetes mellitus with other specified complication: Secondary | ICD-10-CM

## 2023-09-15 ENCOUNTER — Ambulatory Visit (INDEPENDENT_AMBULATORY_CARE_PROVIDER_SITE_OTHER)

## 2023-09-15 DIAGNOSIS — J309 Allergic rhinitis, unspecified: Secondary | ICD-10-CM

## 2023-10-02 ENCOUNTER — Encounter (HOSPITAL_COMMUNITY): Payer: Self-pay

## 2023-10-02 ENCOUNTER — Ambulatory Visit: Payer: Self-pay | Attending: Internal Medicine | Admitting: Internal Medicine

## 2023-10-02 ENCOUNTER — Encounter: Payer: Self-pay | Admitting: Internal Medicine

## 2023-10-02 DIAGNOSIS — I152 Hypertension secondary to endocrine disorders: Secondary | ICD-10-CM

## 2023-10-02 DIAGNOSIS — Z7985 Long-term (current) use of injectable non-insulin antidiabetic drugs: Secondary | ICD-10-CM | POA: Diagnosis not present

## 2023-10-02 DIAGNOSIS — E785 Hyperlipidemia, unspecified: Secondary | ICD-10-CM | POA: Diagnosis not present

## 2023-10-02 DIAGNOSIS — Z7182 Exercise counseling: Secondary | ICD-10-CM | POA: Diagnosis not present

## 2023-10-02 DIAGNOSIS — G4733 Obstructive sleep apnea (adult) (pediatric): Secondary | ICD-10-CM | POA: Insufficient documentation

## 2023-10-02 DIAGNOSIS — K760 Fatty (change of) liver, not elsewhere classified: Secondary | ICD-10-CM

## 2023-10-02 DIAGNOSIS — E1169 Type 2 diabetes mellitus with other specified complication: Secondary | ICD-10-CM | POA: Diagnosis not present

## 2023-10-02 DIAGNOSIS — Z79899 Other long term (current) drug therapy: Secondary | ICD-10-CM | POA: Diagnosis not present

## 2023-10-02 DIAGNOSIS — E119 Type 2 diabetes mellitus without complications: Secondary | ICD-10-CM | POA: Diagnosis not present

## 2023-10-02 DIAGNOSIS — I10 Essential (primary) hypertension: Secondary | ICD-10-CM

## 2023-10-02 DIAGNOSIS — Z6841 Body Mass Index (BMI) 40.0 and over, adult: Secondary | ICD-10-CM | POA: Diagnosis not present

## 2023-10-02 DIAGNOSIS — E1159 Type 2 diabetes mellitus with other circulatory complications: Secondary | ICD-10-CM | POA: Insufficient documentation

## 2023-10-02 DIAGNOSIS — J4540 Moderate persistent asthma, uncomplicated: Secondary | ICD-10-CM | POA: Insufficient documentation

## 2023-10-02 MED ORDER — SEMAGLUTIDE (1 MG/DOSE) 4 MG/3ML ~~LOC~~ SOPN: 1.0000 mg | PEN_INJECTOR | SUBCUTANEOUS | 0 refills | Status: DC

## 2023-10-02 NOTE — Progress Notes (Signed)
 Patient ID: [name], female    DOB: [birth year]  MRN: [id number]  CC: Medical Management of Chronic Issues   Subjective: Cheryl [name] is a 67 y.o. female who presents for chronic ds management. Her concerns today include:  Pt with hx of DM type 2, HTN, HL, moderate persistent asthma, OSA on CPAP, morbid obesity/MALSD, arthritis/LBP, chronic pelvic pain syndrome/pelvic floor dysfunction followed by urology Dr. Judithann Novas   Discussed the use of AI scribe software for clinical note transcription with the patient, who gave verbal consent to proceed.  History of Present Illness Cheryl [name] is a 68 year old female with type 2 diabetes who presents for follow-up on her diabetes management.  DM/MASLD:   Lab Results  Component Value Date   HGBA1C 5.9 09/10/2023  She has been using Ozempic  for her diabetes, initially at 0.25 mg, increased to 0.5 mg, and then to 1 mg weekly. She did not receive the 1 mg dose and continued with 0.5 mg until she ran out two weeks ago. She has not taken Ozempic  for the past two weeks. Ozempic  has decreased her appetite, resulting in a weight loss of 16 pounds since January, from 292 pounds to 276 pounds. She exercises regularly, biking for 15 minutes and walking for 15 to 30 minutes, three to four days a week. She experiences no vomiting, abdominal pain, or severe diarrhea while on Ozempic .  She reports less joint pain since losing weight, attributing it to reduced mechanical strain. She is also eating smaller portion sizes and incorporating protein with each meal to maintain muscle mass.  HTN: Her blood pressure is managed with lisinopril  10 mg and Maxzide 75/50 mg. She checks her blood pressure about once a week, noting a high reading of 200 recently, which she attributes to using a wrist monitor. She limits her salt intake.  For cholesterol management, she was switched from simvastatin  to rosuvastatin  20 mg and is compliant with this medication.    Patient  Active Problem List   Diagnosis Date Noted   Hyperlipidemia associated with type 2 diabetes mellitus (HCC) 01/25/2021   Type 2 diabetes mellitus with morbid obesity (HCC) 01/25/2021   Moderate persistent asthma, uncomplicated 11/04/2018   OSA (obstructive sleep apnea) 12/02/2016   Hyperlipidemia LDL goal <100 10/21/2014   Essential hypertension, benign 10/21/2014   DM w/o complication type II (HCC) 09/13/2014   Routine general medical examination at a health care facility 10/12/2013   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 05/03/2013   Need for prophylactic vaccination and inoculation against influenza 02/08/2013   GERD (gastroesophageal reflux disease) 02/08/2013   Other and unspecified hyperlipidemia 11/10/2012   Type II or unspecified type diabetes mellitus without mention of complication, not stated as uncontrolled 10/25/2012   Impaired fasting glucose 09/07/2012   Morbid obesity (HCC) 09/07/2012   Generalized osteoarthrosis, involving multiple sites 09/07/2012   Carpal tunnel syndrome 09/07/2012   Other, multiple, and unspecified sites, insect bite, nonvenomous, without mention of infection(919.4) 09/07/2012   Special screening for malignant neoplasms, colon 09/07/2012   Other specified cardiac dysrhythmias(427.89) 09/07/2012   Candidiasis of vulva and vagina 09/07/2012   Abdominal pain, generalized 09/07/2012   Symptomatic menopausal or female climacteric states 09/07/2012   Hypertension associated with diabetes (HCC) 09/07/2012   Perennial allergic rhinitis 09/07/2012   Pain in joint 09/07/2012   Other malaise and fatigue 09/07/2012     Current Outpatient Medications on File Prior to Visit  Medication Sig Dispense Refill   acetaminophen  (  TYLENOL ) 500 MG tablet Take 500 mg by mouth daily.     albuterol  (VENTOLIN  HFA) 108 (90 Base) MCG/ACT inhaler INHALE 2 PUFFS INTO THE LUNGS UP TO EVERY 6HRS AS NEEDED FOR WHEEZING/SHORTNESS OF BREATH 18 each 1   AMBULATORY NON  FORMULARY MEDICATION Medication Name: Allergy  Injection- every 4 weeks  "I take it once a week" 01/14/23     aspirin 81 MG tablet Take 81 mg by mouth daily.     Azelastine  HCl 137 MCG/SPRAY SOLN PLACE 1-2 SPRAYS IN EACH NOSTRIL ONCE DAILY. 90 mL 0   B-D ULTRA-FINE 33 LANCETS MISC Use to test blood sugar once daily. Dx: E11.9 100 each 6   budesonide -formoterol  (SYMBICORT ) 160-4.5 MCG/ACT inhaler TAKE 2 PUFFS BY MOUTH TWICE A DAY 30.6 each 5   Calcium  Carbonate-Vitamin D  600-400 MG-UNIT chew tablet Chew 1 tablet by mouth daily.      EPINEPHRINE  0.3 mg/0.3 mL IJ SOAJ injection INJECT 0.3 MG INTO THE MUSCLE AS NEEDED FOR ANAPHYLAXIS. 2 each 1   fluticasone  (FLONASE ) 50 MCG/ACT nasal spray Place 1-2 sprays into both nostrils daily. 48 mL 2   gabapentin  (NEURONTIN ) 300 MG capsule TAKE 1 CAPSULE BY MOUTH EVERYDAY AT BEDTIME 90 capsule 1   levocetirizine (XYZAL ) 5 MG tablet TAKE 1 TABLET BY MOUTH EVERY DAY IN THE EVENING 90 tablet 0   lisinopril  (ZESTRIL ) 10 MG tablet Take 1 tablet (10 mg total) by mouth daily. 90 tablet 1   Multiple Vitamin (MULTIVITAMIN ADULT PO)      nabumetone (RELAFEN) 500 MG tablet Take 500 mg by mouth daily. PRN     omeprazole  (PRILOSEC) 40 MG capsule TAKE 1 CAPSULE (40 MG TOTAL) BY MOUTH DAILY. 90 capsule 0   Respiratory Therapy Supplies (CARETOUCH 2 CPAP HOSE HANGER) MISC      rosuvastatin  (CRESTOR ) 20 MG tablet Take 1 tablet (20 mg total) by mouth daily. 90 tablet 1   Spacer/Aero-Holding Chambers DEVI 1 each by Does not apply route as needed. 1 each 0   triamterene -hydrochlorothiazide (MAXZIDE) 75-50 MG tablet TAKE 1 TABLET BY MOUTH EVERY DAY 90 tablet 1   vitamin C (ASCORBIC ACID) 500 MG tablet Take 500 mg by mouth every other day.     docusate sodium  (COLACE) 100 MG capsule Take 1 capsule (100 mg total) by mouth every 12 (twelve) hours. 60 capsule 0   promethazine  (PHENERGAN ) 25 MG tablet Take 0.5 tablets (12.5 mg total) by mouth every 8 (eight) hours as needed for nausea or  vomiting. 30 tablet 0   sodium fluoride (DENTA 5000 PLUS) 1.1 % CREA dental cream Place 1 Application onto teeth at bedtime.     No current facility-administered medications on file prior to visit.    Allergies  Allergen Reactions   Misc. Sulfonamide Containing Compounds Diarrhea   Elemental Sulfur Diarrhea   Oxycodone Nausea Only and Other (See Comments)    Had stomach and headache as side effect from medicine    Social History   Socioeconomic History   Marital status: Divorced    Spouse name: Not on file   Number of children: Not on file   Years of education: Not on file   Highest education level: Not on file  Occupational History   Not on file  Tobacco Use   Smoking status: Never   Smokeless tobacco: Never  Vaping Use   Vaping status: Never Used  Substance and Sexual Activity   Alcohol use: Yes    Comment: Seldom- Wine    Drug  use: No   Sexual activity: Not Currently    Comment: post office worker  Other Topics Concern   Not on file  Social History Narrative   Not on file   Social Drivers of Health   Financial Resource Strain: Low Risk  (03/10/2023)   Overall Financial Resource Strain (CARDIA)    Difficulty of Paying Living Expenses: Not hard at all  Food Insecurity: No Food Insecurity (03/10/2023)   Hunger Vital Sign    Worried About Running Out of Food in the Last Year: Never true    Ran Out of Food in the Last Year: Never true  Transportation Needs: No Transportation Needs (03/10/2023)   PRAPARE - Administrator, Civil Service (Medical): No    Lack of Transportation (Non-Medical): No  Physical Activity: Insufficiently Active (03/10/2023)   Exercise Vital Sign    Days of Exercise per Week: 3 days    Minutes of Exercise per Session: 30 min  Stress: No Stress Concern Present (03/10/2023)   [employer] of Occupational Health - Occupational Stress Questionnaire    Feeling of Stress : Only a little  Social Connections: Socially Isolated  (03/10/2023)   Social Connection and Isolation Panel [NHANES]    Frequency of Communication with Friends and Family: More than three times a week    Frequency of Social Gatherings with Friends and Family: Never    Attends Religious Services: Never    Database administrator or Organizations: No    Attends Banker Meetings: Never    Marital Status: Never married  Intimate Partner Violence: Not At Risk (03/10/2023)   Humiliation, Afraid, Rape, and Kick questionnaire    Fear of Current or Ex-Partner: No    Emotionally Abused: No    Physically Abused: No    Sexually Abused: No    Family History  Problem Relation Age of Onset   Cancer Father    Diabetes Father    Allergic rhinitis Son    Diabetes Brother    Allergic rhinitis Son    Diabetes Paternal Aunt    Lung cancer Cousin    Asthma Neg Hx     Past Surgical History:  Procedure Laterality Date   ABDOMINAL HYSTERECTOMY     1994   carpal tunnel both hands     2012   CLOSED MANIPULATION SHOULDER Left 04/2014   EYE SURGERY Left 08/25/2015   EYE SURGERY Right 09/24/2015   JOINT REPLACEMENT     both knees replacement, 2010   mole removed from face     Nodule removed from back     SHOULDER SURGERY Left 11/2013   TONSILLECTOMY     as teenager    ROS: Review of Systems Negative except as stated above  PHYSICAL EXAM: BP 124/77   Pulse 68   Ht 5\' 8"  (1.727 m)   Wt 276 lb 6.4 oz (125.4 kg)   SpO2 97%   BMI 42.03 kg/m   Wt Readings from Last 3 Encounters:  10/02/23 276 lb 6.4 oz (125.4 kg)  08/06/23 284 lb 9.6 oz (129.1 kg)  06/04/23 292 lb (132.5 kg)    Physical Exam  General appearance - alert, well appearing, and in no distress Mental status - normal mood, behavior, speech, dress, motor activity, and thought processes Neck - supple, no significant adenopathy Chest - clear to auscultation, no wheezes, rales or rhonchi, symmetric air entry Heart - normal rate, regular rhythm, normal S1, S2, no  murmurs, rubs, clicks or  gallops Extremities - peripheral pulses normal, no pedal edema, no clubbing or cyanosis      Latest Ref Rng & Units 05/12/2023    3:14 PM 02/03/2023    2:22 PM 01/01/2023   10:08 AM  CMP  Glucose 70 - 99 mg/dL 161   096   BUN 8 - 23 mg/dL 15   18   Creatinine 0.45 - 1.00 mg/dL 4.09   8.11   Sodium 914 - 145 mmol/L 135   142   Potassium 3.5 - 5.1 mmol/L 4.4   3.9   Chloride 98 - 111 mmol/L 96   103   CO2 22 - 32 mmol/L 24   23   Calcium  8.9 - 10.3 mg/dL 9.8   78.2   Total Protein 6.5 - 8.1 g/dL 7.5  7.2    Total Bilirubin <1.2 mg/dL 0.6  <9.5    Alkaline Phos 38 - 126 U/L 56  76    AST 15 - 41 U/L 31  18    ALT 0 - 44 U/L 18  16     Lipid Panel     Component Value Date/Time   CHOL 142 09/10/2023 1408   TRIG 121 09/10/2023 1408   HDL 54 09/10/2023 1408   CHOLHDL 2.6 09/10/2023 1408   CHOLHDL 3.0 07/23/2018 1045   VLDL 20 11/17/2016 1007   LDLCALC 67 09/10/2023 1408   LDLCALC 86 07/23/2018 1045    CBC    Component Value Date/Time   WBC 10.9 (H) 05/12/2023 1514   RBC 4.99 05/12/2023 1514   HGB 14.1 05/12/2023 1514   HGB 13.7 02/03/2023 1422   HCT 41.7 05/12/2023 1514   HCT 41.1 02/03/2023 1422   PLT 353 05/12/2023 1514   PLT 374 02/03/2023 1422   MCV 83.6 05/12/2023 1514   MCV 86 02/03/2023 1422   MCH 28.3 05/12/2023 1514   MCHC 33.8 05/12/2023 1514   RDW 14.7 05/12/2023 1514   RDW 14.4 02/03/2023 1422   LYMPHSABS 1.4 05/12/2023 1514   LYMPHSABS 2.3 07/29/2018 1443   MONOABS 0.8 05/12/2023 1514   EOSABS 0.1 05/12/2023 1514   EOSABS 0.2 07/29/2018 1443   BASOSABS 0.1 05/12/2023 1514   BASOSABS 0.0 07/29/2018 1443    ASSESSMENT AND PLAN: 1. Type 2 diabetes mellitus with morbid obesity (HCC) (Primary) Commended her on weight loss so far.  Encouraged her to continue healthy eating habits and regular exercise.  Prescription sent for Ozempic  1 mg.  After she has been on it for 1 month, if she is tolerating the medication without significant  side effects, she should send me a MyChart message to let me know so that we can increase it to the 2 mg which is the highest dose. - Semaglutide , 1 MG/DOSE, 4 MG/3ML SOPN; Inject 1 mg as directed once a week.  Dispense: 3 mL; Refill: 0  2. Long-term (current) use of injectable non-insulin antidiabetic drugs See #1 above  3. Hyperlipidemia associated with type 2 diabetes mellitus (HCC) Continue Crestor  20 mg daily.  4. Hypertension associated with diabetes (HCC) Continue Maxide 75/50 and lisinopril  10 mg daily.  5. Metabolic dysfunction-associated steatotic liver disease (MASLD) Advised that continued weight loss, healthy eating habits and regular exercise will help with this.   Patient was given the opportunity to ask questions.  Patient verbalized understanding of the plan and was able to repeat key elements of the plan.   This documentation was completed using Paediatric nurse.  Any transcriptional errors are  unintentional.  No orders of the defined types were placed in this encounter.    Requested Prescriptions   Signed Prescriptions Disp Refills   Semaglutide , 1 MG/DOSE, 4 MG/3ML SOPN 3 mL 0    Sig: Inject 1 mg as directed once a week.    Return in about 4 months (around 02/02/2024).  Concetta Dee, MD, FACP

## 2023-10-02 NOTE — Patient Instructions (Signed)
 VISIT SUMMARY:  Today, we discussed your diabetes management, blood pressure, cholesterol, and overall wellness. You have been doing well with your current medications and lifestyle changes, and we made some adjustments to your treatment plan to help you continue improving your health.  YOUR PLAN:  -TYPE 2 DIABETES MELLITUS: Type 2 diabetes is a condition where your body does not use insulin properly, leading to high blood sugar levels. Your diabetes is well-controlled with Ozempic , and your Hemoglobin A1c is 5.9. We will send a prescription for Ozempic  1 mg to the pharmacy. Start taking 1 mg weekly and monitor for side effects like vomiting, abdominal pain, or severe diarrhea. If you tolerate the 1 mg dose well after 4 weeks, we may increase it to 2 mg. Continue your efforts in weight loss to help manage your diabetes and improve liver health.  -METABOLIC ASSOCIATED FATTY LIVER DISEASE: This condition is where fat builds up in your liver, often associated with diabetes and obesity. Your weight loss and diabetes management with Ozempic  are beneficial for your liver health. Continue your weight loss efforts and diabetes management to improve your liver health.  -HYPERTENSION: Hypertension, or high blood pressure, is well-controlled with your current medications, lisinopril  10 mg and Maxzide 75/50 mg. You have had occasional high readings, possibly due to using a wrist cuff. Continue taking your medications, use an arm blood pressure cuff for more accurate readings, limit your salt intake, and monitor your blood pressure weekly.  -HYPERLIPIDEMIA: Hyperlipidemia is having high levels of fats in your blood, which can increase your risk of heart disease. You are managing this with rosuvastatin  20 mg daily. Continue taking this medication as prescribed.  Cheryl [name] VISIT: You are engaged in regular physical activity and have reported decreased joint pain with weight loss. Continue your current exercise  regimen of biking and walking 3-4 days a week for 15-30 minutes. Also, continue to include protein with each meal to maintain muscle mass.  INSTRUCTIONS:  Start taking Ozempic  1 mg weekly and monitor for side effects. If you tolerate the 1 mg dose well after 4 weeks, we may increase it to 2 mg. Continue monitoring your blood pressure weekly using an arm cuff for more accurate readings. Follow up with us  if you experience any side effects or have concerns about your medications or health.

## 2023-10-06 ENCOUNTER — Other Ambulatory Visit: Payer: Self-pay | Admitting: Allergy & Immunology

## 2023-10-09 ENCOUNTER — Other Ambulatory Visit: Payer: Self-pay

## 2023-10-09 ENCOUNTER — Ambulatory Visit: Attending: Internal Medicine | Admitting: Pharmacist

## 2023-10-09 ENCOUNTER — Telehealth: Payer: Self-pay | Admitting: Pharmacist

## 2023-10-09 ENCOUNTER — Encounter: Payer: Self-pay | Admitting: Pharmacist

## 2023-10-09 DIAGNOSIS — E1169 Type 2 diabetes mellitus with other specified complication: Secondary | ICD-10-CM | POA: Insufficient documentation

## 2023-10-09 DIAGNOSIS — Z7985 Long-term (current) use of injectable non-insulin antidiabetic drugs: Secondary | ICD-10-CM

## 2023-10-09 MED ORDER — OZEMPIC (0.25 OR 0.5 MG/DOSE) 2 MG/3ML ~~LOC~~ SOPN: 0.5000 mg | PEN_INJECTOR | SUBCUTANEOUS | Status: AC

## 2023-10-09 MED ORDER — SEMAGLUTIDE (1 MG/DOSE) 4 MG/3ML ~~LOC~~ SOPN
1.0000 mg | PEN_INJECTOR | SUBCUTANEOUS | 2 refills | Status: DC
Start: 2023-10-09 — End: 2023-10-09
  Filled 2023-10-09 (×2): qty 3, 28d supply, fill #0

## 2023-10-09 MED ORDER — OZEMPIC (0.25 OR 0.5 MG/DOSE) 2 MG/3ML ~~LOC~~ SOPN
0.5000 mg | PEN_INJECTOR | SUBCUTANEOUS | 0 refills | Status: DC
  Filled 2023-10-09 (×2): qty 3, 28d supply, fill #0

## 2023-10-09 MED ORDER — SEMAGLUTIDE (1 MG/DOSE) 4 MG/3ML ~~LOC~~ SOPN: 1.0000 mg | PEN_INJECTOR | SUBCUTANEOUS | 2 refills | Status: DC

## 2023-10-09 NOTE — Telephone Encounter (Signed)
Can we start a PA for this patient's Ozempic? 

## 2023-10-09 NOTE — Progress Notes (Signed)
 S:     No chief complaint on file.  68 y.o. female who presents for diabetes evaluation, education, and management. Patient arrives in good spirits and presents without any assistance.   Patient was referred and last seen by Primary Care Provider, Dr. Lincoln Renshaw, on 10/02/2023. At that visit, she reported doing well with her weight being 276 (down from 292 lbs prior)! Of note, she reported going without Ozempic  due to insurance issues.   PMH is significant for T2DM, HTN, HLD, moderate persistent asthma, OSA on CPAP, morbid obesity, arthritis/LBP, chronic pelvic pain syndrome/pelvic floor dysfunction followed by urology Dr. Judithann Novas.  Today, patient reports doing well. Denies any changes in vision. No NV or abdominal pain. She has been without Ozempic  for ~1 month. Gives reason as an issurance issue. I will have our team check on a PA today.   Family/Social History:  Fhx: DM Tobacco: never smoker   Alcohol: none reported   Current diabetes medications include: Ozempic  1mg  subcutaneous weekly (not taking) Current hypertension medications include: lisinopril  10mg  PO daily, trriamterene-hydrochlorothiazide 75-50 mg daily Current hyperlipidemia medications include: rosuvastatin  20mg  PO daily   Patient reports adherence to taking all medications as prescribed.   Insurance coverage: Medicare  Patient denies hypoglycemic events.  Patient denies polyuria. Patient denies neuropathy (nerve pain). Patient denies visual changes. Patient reports self foot exams.   Patient reported dietary habits:  -Notes a decreased appetite since increasing   Patient-reported exercise habits:  -10 minutes every day/counts 8-9,000 steps daily  -Very active with her grandchildren -Notes that her activity has increased overall since last visit  O:  Lab Results  Component Value Date   HGBA1C 5.9 09/10/2023   There were no vitals filed for this visit.  Lipid Panel     Component Value Date/Time    CHOL 142 09/10/2023 1408   TRIG 121 09/10/2023 1408   HDL 54 09/10/2023 1408   CHOLHDL 2.6 09/10/2023 1408   CHOLHDL 3.0 07/23/2018 1045   VLDL 20 11/17/2016 1007   LDLCALC 67 09/10/2023 1408   LDLCALC 86 07/23/2018 1045    Clinical Atherosclerotic Cardiovascular Disease (ASCVD): No  The 10-year ASCVD risk score (Arnett DK, et al., 2019) is: 17.8%   Values used to calculate the score:     Age: 38 years     Sex: Female     Is Non-Hispanic African American: Yes     Diabetic: Yes     Tobacco smoker: No     Systolic Blood Pressure: 124 mmHg     Is BP treated: Yes     HDL Cholesterol: 54 mg/dL     Total Cholesterol: 142 mg/dL   Patient is participating in a Managed Medicaid Plan: No   A/P: Diabetes longstanding currently well controlled. She is tolerating Ozempic  well and we are increasing dose to maximize weight loss benefit. She has already experienced a near 20-lb weight loss on the lower dose. Patient is not symptomatic at this time and is able to verbalize appropriate hypoglycemia management plan. Medication adherence appears to be suboptimal due to insurance requiring a PA. By the end of the work day, Loetta Ringer in our pharmacy was able to get a PA approved and pt was able to fill Ozempic  before leaving our pharmacy. We are having her restart the Ozempic  at a 0.5 mg weekly dose for tolerability. After 2 weeks, she can increase to the 1 mg dose as long as this is tolerable. -Restart Ozempic  0.5 mg weekly x2 weeks. Follow  this up with Ozempic  to 1 mg weekly thereafter.  -Patient educated on purpose, proper use, and potential adverse effects of Ozempic .  -Extensively discussed pathophysiology of diabetes, recommended lifestyle interventions, dietary effects on blood sugar control.  -Counseled on s/sx of and management of hypoglycemia.  -Next A1c anticipated 11/2023.   Written patient instructions provided. Patient verbalized understanding of treatment plan.  Total time in face to face  counseling 20 minutes.    Follow-up:  Pharmacist in 1 month  Marene Shape, PharmD, [place], CPP Clinical Pharmacist [facility] & [facility] [phone]

## 2023-10-11 ENCOUNTER — Other Ambulatory Visit: Payer: Self-pay | Admitting: Allergy & Immunology

## 2023-10-12 ENCOUNTER — Ambulatory Visit (INDEPENDENT_AMBULATORY_CARE_PROVIDER_SITE_OTHER): Payer: Self-pay

## 2023-10-12 DIAGNOSIS — J309 Allergic rhinitis, unspecified: Secondary | ICD-10-CM

## 2023-10-14 NOTE — Progress Notes (Signed)
 Cheryl [name]    [id number]    [birth year]  Primary Care Physician:Carter, Holland Lundborg, DO  HPI female never smoker followed for OSA, complicated by asthma( Dr Cheryl [name]), DM 2, HBP, GERD, allergic rhinitis/allergy  vaccine (Dr Cheryl [name]), glaucoma NPSG- 11/25/16- AHI 40.3/hour, desaturation to 88%, body weight 286 pounds  ----------------------------------------------------------------   02/23/23- 68 year old female never smoker(son is Sports administrator) followed for OSA, complicated by Asthma( Dr Cheryl [name]), DM 2, HBP, GERD, Allergic Rhinitis/Allergy  Vaccine   -Albuterol  hfa, Symbicort  160,  CPAP auto 5-15/ [facility]            AirSense 10 AutoSet Download-compliance 100%, AHI 6/hr (from last machine up to August 6) Body weight today-275  lbs Download reviewed. Had flu vax. Machine recently replaced. Working fine, but AirView not installed. Had Covid last December and sinusitis in June. Both resolved and she feels well now.Has had flu and covid vax.  10/16/23- 68 year old female never smoker(son is Sports administrator) followed for OSA, complicated by Asthma( Dr Cheryl [name]), DM 2, HBP, GERD, Allergic Rhinitis(Allergy  Vaccine), -Albuterol  hfa, Symbicort  160,  CPAP auto 5-15/ [facility]            AirSense 10 AutoSet Download-compliance not available- AirView expired Body weight today-272 lbs Obesity not improved. Discussed the use of AI scribe software for clinical note transcription with the patient, who gave verbal consent to proceed.  History of Present Illness   Cheryl [name] is a 68 year old female who presents for CPAP management.  Her CPAP machine is functioning well, and she reports improved sleep quality, describing it as 'sleeping like a baby' and stating that 'life is better'. Occasionally, she experiences a bad night of sleep, which she does not attribute to the CPAP machine. The Airview software on her CPAP machine has expired, and she brought the machine for evaluation. No respiratory issues are present.  There is no SD card. We will ask DME to address.     Assessment and Plan:    Obstructive Sleep Apnea Obstructive sleep apnea well-managed with CPAP. Good sleep quality and improved life quality reported. No CPAP issues or breathing difficulties. Current settings effective. - Order SD card installation in CPAP for data monitoring. - Instruct her to bring SD card to appointments for review. - Continue current CPAP settings and supplies with DME company, [employer]. - Provide after-visit summary for DME if needed.     Obesity - Encourage long term effort   ROS-see HPI   + = positive Constitutional:    weight loss, night sweats, fevers, chills, fatigue, lassitude. HEENT:    headaches, difficulty swallowing, tooth/dental problems, sore throat,       sneezing, itching, ear ache, nasal congestion, post nasal drip, snoring CV:    chest pain, orthopnea, PND, swelling in lower extremities, anasarca,                                            dizziness, palpitations Resp:   shortness of breath with exertion or at rest.                productive cough,   non-productive cough, coughing up of blood.              change in color of mucus.  wheezing.   Skin:    rash or lesions. GI:  No-   heartburn, indigestion, abdominal pain,  nausea, vomiting, diarrhea,                 change in bowel habits, loss of appetite GU: dysuria, change in color of urine, no urgency or frequency.   flank pain. MS:   joint pain, stiffness, decreased range of motion, back pain. Neuro-    +syncopal episode Psych:  change in mood or affect.  depression or anxiety.   memory loss.  OBJ- Physical Exam General- Alert, Oriented, Affect-appropriate, Distress- none acute, + morbidly obese Skin- rash-none, lesions- none, excoriation- none Lymphadenopathy- none Head- atraumatic            Eyes- Gross vision intact, PERRLA, conjunctivae and secretions clear            Ears- Hearing, canals-normal            Nose- Clear, no-Septal dev,  mucus, polyps, erosion, perforation             Throat- Mallampati IV , mucosa clear , drainage- none, tonsils- atrophic, + own teeth Neck- flexible , trachea midline, no stridor , thyroid  nl, carotid no bruit Chest - symmetrical excursion , unlabored           Heart/CV- RRR , +1/6S murmur , no gallop  , no rub, nl s1 s2                           - JVD- none , edema- none, stasis changes- none, varices- none           Lung- clear to P&A, wheeze- none, cough- none , dullness-none, rub- none           Chest wall-  Abd-  Br/ Gen/ Rectal- Not done, not indicated Extrem- cyanosis- none, clubbing, none, atrophy- none, strength- nl Neuro- grossly intact to observation

## 2023-10-16 ENCOUNTER — Encounter: Payer: Self-pay | Admitting: Internal Medicine

## 2023-10-16 ENCOUNTER — Ambulatory Visit (INDEPENDENT_AMBULATORY_CARE_PROVIDER_SITE_OTHER): Payer: Medicare Other | Admitting: Internal Medicine

## 2023-10-16 VITALS — BP 110/73 | HR 70 | Ht 68.0 in | Wt 272.8 lb

## 2023-10-16 DIAGNOSIS — G4733 Obstructive sleep apnea (adult) (pediatric): Secondary | ICD-10-CM | POA: Diagnosis not present

## 2023-10-16 NOTE — Patient Instructions (Signed)
 Order- DME [employer]- Continue auto 5-25, mas of choice, humidifier, supplies. Please reinstall Airview or SD card for download capability.  Please call if we can help

## 2023-10-21 ENCOUNTER — Other Ambulatory Visit: Payer: Self-pay | Admitting: Allergy & Immunology

## 2023-10-29 ENCOUNTER — Encounter: Payer: Self-pay | Admitting: Allergy & Immunology

## 2023-10-29 ENCOUNTER — Other Ambulatory Visit: Payer: Self-pay

## 2023-10-29 ENCOUNTER — Ambulatory Visit (INDEPENDENT_AMBULATORY_CARE_PROVIDER_SITE_OTHER): Payer: Medicare Other | Admitting: Allergy & Immunology

## 2023-10-29 VITALS — BP 126/76 | HR 71 | Temp 97.7°F | Resp 18 | Ht 67.32 in | Wt 271.9 lb

## 2023-10-29 DIAGNOSIS — J4540 Moderate persistent asthma, uncomplicated: Secondary | ICD-10-CM | POA: Diagnosis not present

## 2023-10-29 DIAGNOSIS — J3089 Other allergic rhinitis: Secondary | ICD-10-CM

## 2023-10-29 DIAGNOSIS — J302 Other seasonal allergic rhinitis: Secondary | ICD-10-CM

## 2023-10-29 NOTE — Patient Instructions (Addendum)
 1. Moderate persistent asthma, uncomplicated - Lung testing looked good at the last visit.  - Daily controller medication(s): Symbicort  160/4.34mcg two puffs at least once daily with spacer - Prior to physical activity: albuterol  2 puffs 10-15 minutes before physical activity. - Rescue medications: albuterol  4 puffs every 4-6 hours as needed - Asthma control goals:  * Full participation in all desired activities (may need albuterol  before activity) * Albuterol  use two time or less a week on average (not counting use with activity) * Cough interfering with sleep two time or less a month * Oral steroids no more than once a year * No hospitalizations  2. Perennial allergic rhinitis (grasses, ragweed, outdoor molds, dust mites, cockroach) - Continue with allergy  shots at the same schedule.  - Continue with cetirizine 10 mg daily. - Continue with one spray of Astelin  (azelastine ) ONCE DAILY IN THE MORNING.  - Continue with Flonase  1 to 2 sprays per nostril ONCE DAILY IN THE EVENING.  - Use the nasal saline rinses religiously for the next month or so to stay ahead of these allergy  symptoms.   3. Return in about 6 months (around 04/29/2024). You can have the follow up appointment with Dr. Idolina Maker or a Nurse Practicioner (our Nurse Practitioners are excellent and always have Physician oversight!).   CONGRATS ON YOUR WEIGHT LOSS!    Please inform us  of any Emergency Department visits, hospitalizations, or changes in symptoms. Call us  before going to the ED for breathing or allergy  symptoms since we might be able to fit you in for a sick visit. Feel free to contact us  anytime with any questions, problems, or concerns.  It was a pleasure to see you again today!  Websites that have reliable patient information: 1. American Academy of Asthma, Allergy , and Immunology: www.aaaai.org 2. Food Allergy  Research and Education (FARE): foodallergy.org 3. Mothers of Asthmatics:  http://www.asthmacommunitynetwork.org 4. American College of Allergy , Asthma, and Immunology: www.acaai.org      "Like" us  on Facebook and Instagram for our latest updates!      A healthy democracy works best when [employer] participate! Make sure you are registered to vote! If you have moved or changed any of your contact information, you will need to get this updated before voting! Scan the QR codes below to learn more!

## 2023-10-29 NOTE — Progress Notes (Signed)
 FOLLOW UP  Date of Service/Encounter:  10/29/23   Assessment:   Moderate persistent asthma   Perennial and seasonal allergic rhinitis (grasses,  ragweed, outdoor molds, dust mites, cockroach) - on allergen immunotherapy with a new script that started in 2024   Complex medical history   Plan/Recommendations:   1. Moderate persistent asthma, uncomplicated - Lung testing looked good at the last visit.  - Daily controller medication(s): Symbicort  160/4.22mcg two puffs at least once daily with spacer - Prior to physical activity: albuterol  2 puffs 10-15 minutes before physical activity. - Rescue medications: albuterol  4 puffs every 4-6 hours as needed - Asthma control goals:  * Full participation in all desired activities (may need albuterol  before activity) * Albuterol  use two time or less a week on average (not counting use with activity) * Cough interfering with sleep two time or less a month * Oral steroids no more than once a year * No hospitalizations  2. Perennial allergic rhinitis (grasses, ragweed, outdoor molds, dust mites, cockroach) - Continue with allergy  shots at the same schedule.  - Continue with cetirizine 10 mg daily. - Continue with one spray of Astelin  (azelastine ) ONCE DAILY IN THE MORNING.  - Continue with Flonase  1 to 2 sprays per nostril ONCE DAILY IN THE EVENING.  - Use the nasal saline rinses religiously for the next month or so to stay ahead of these allergy  symptoms.   3. Return in about 6 months (around 04/29/2024). You can have the follow up appointment with Dr. Idolina Maker or a Nurse Practicioner (our Nurse Practitioners are excellent and always have Physician oversight!).   Subjective:   Cheryl [name] is a 68 y.o. female presenting today for follow up of  Chief Complaint  Patient presents with   Establish Care    Pt been feeling tired since starting ozempic     Cheryl [name] has a history of the following: Patient Active Problem List   Diagnosis  Date Noted   Hyperlipidemia associated with type 2 diabetes mellitus (HCC) 01/25/2021   Type 2 diabetes mellitus with morbid obesity (HCC) 01/25/2021   Moderate persistent asthma, uncomplicated 11/04/2018   OSA (obstructive sleep apnea) 12/02/2016   Hyperlipidemia LDL goal <100 10/21/2014   Essential hypertension, benign 10/21/2014   DM w/o complication type II (HCC) 09/13/2014   Routine general medical examination at a health care facility 10/12/2013   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 05/03/2013   Need for prophylactic vaccination and inoculation against influenza 02/08/2013   GERD (gastroesophageal reflux disease) 02/08/2013   Other and unspecified hyperlipidemia 11/10/2012   Type II or unspecified type diabetes mellitus without mention of complication, not stated as uncontrolled 10/25/2012   Impaired fasting glucose 09/07/2012   Morbid obesity (HCC) 09/07/2012   Generalized osteoarthrosis, involving multiple sites 09/07/2012   Carpal tunnel syndrome 09/07/2012   Other, multiple, and unspecified sites, insect bite, nonvenomous, without mention of infection(919.4) 09/07/2012   Special screening for malignant neoplasms, colon 09/07/2012   Other specified cardiac dysrhythmias(427.89) 09/07/2012   Candidiasis of vulva and vagina 09/07/2012   Abdominal pain, generalized 09/07/2012   Symptomatic menopausal or female climacteric states 09/07/2012   Hypertension associated with diabetes (HCC) 09/07/2012   Perennial allergic rhinitis 09/07/2012   Pain in joint 09/07/2012   Other malaise and fatigue 09/07/2012    History obtained from: chart review and patient.  Discussed the use of AI scribe software for clinical note transcription with the patient and/or guardian, who gave verbal consent to  proceed.  Cheryl [name] is a 68 y.o. female presenting for a follow up visit.  She was last seen in December 2024.  At that time, her lung testing looked great.  We continue with Symbicort  2 puffs  twice a day.  For her rhinitis, we continue with allergy  shots.  We also continue with cetirizine.  We recommended stopping the Astelin  to see how she does and continuing with Flonase .  Since last visit, she was doing very well.  Asthma/Respiratory Symptom History: She is currently using Symbicort , typically taking two puffs once a day, although she acknowledges she should be using it twice daily. She did not perform a breathing test during this visit but recalls previous challenges with the test. She is also on Xalatan, which she takes in the morning. Her eyes and nose sometimes itch, which she considers normal for her.  Allergic Rhinitis Symptom History: She has been on allergy  shots for five years, with a recent change in her regimen due to previous issues with itching. She is now taking three shots instead of one. She continues to take cetirizine (Zyrtec) and uses Flonase  and another nasal spray once daily, one in the morning and one at night. She experiences occasional itching of her eyes and nose, which she attributes to normal allergy  symptoms.  [name] is on allergen immunotherapy. She receives two injections. Immunotherapy script #1 contains weeds, grasses, and dust mites. She currently receives 0.50mL of the RED vial (1/100). Immunotherapy script #2 contains molds and cockroach. She currently receives 0.50mL of the RED vial (1/100). She started shots January of 2024 and reached maintenance in April 2024. We restarted her allergy  shots in January 2024 after we added grasses and molds. She had repeat testing in October 2023 which is why we remixed her shots. Her symptoms were not fully controlled on the previous script. She is doing well with this current script and feels that they have been efficacious.   She started Ozempic  3-4 months ago. She thinks that she is tolerating it well without a problem. She was off of it for one month during some supply issues. There was a temporary discontinuation due  to a shortage, during which she was off the medication for about a month before restarting at a lower dose of 0.5 mg. She thinks that she has lost around 16 pounds. She was already decreasing and she is now down to near 270.    She discusses her social activities, including walking in various parks to avoid routine and potential safety concerns. She mentions her family, including a granddaughter who is nearing driving age, and plans to travel to [place]  with someone else, as she is not allowed to drive there herself.   Otherwise, there have been no changes to her past medical history, surgical history, family history, or social history.    Review of systems otherwise negative other than that mentioned in the HPI.    Objective:   Blood pressure 126/76, pulse 71, temperature 97.7 F (36.5 C), temperature source Temporal, resp. rate 18, height 5' 7.32" (1.71 m), weight 271 lb 14.4 oz (123.3 kg), SpO2 98%. Body mass index is 42.18 kg/m.    Physical Exam Vitals reviewed.  Constitutional:      Appearance: She is well-developed.     Comments: Lovely and smiling.  Delightful!  HENT:     Head: Normocephalic and atraumatic.     Right Ear: Tympanic membrane, ear canal and external ear normal.     Left Ear: Tympanic membrane,  ear canal and external ear normal.     Nose: No nasal deformity, septal deviation, mucosal edema or rhinorrhea.     Right Turbinates: Swollen and pale. Not enlarged.     Left Turbinates: Swollen and pale. Not enlarged.     Right Sinus: No maxillary sinus tenderness or frontal sinus tenderness.     Left Sinus: No maxillary sinus tenderness or frontal sinus tenderness.     Comments: No polyps.    Mouth/Throat:     Mouth: Mucous membranes are not pale and not dry.     Pharynx: Uvula midline.  Eyes:     General: Lids are normal. Allergic shiner present.        Right eye: No discharge.        Left eye: No discharge.     Conjunctiva/sclera: Conjunctivae normal.      Right eye: Right conjunctiva is not injected. No chemosis.    Left eye: Left conjunctiva is not injected. No chemosis.    Pupils: Pupils are equal, round, and reactive to light.  Cardiovascular:     Rate and Rhythm: Normal rate and regular rhythm.     Heart sounds: Normal heart sounds.  Pulmonary:     Effort: Pulmonary effort is normal. No tachypnea, accessory muscle usage or respiratory distress.     Breath sounds: Normal breath sounds. No wheezing, rhonchi or rales.     Comments: Moving air well in all lung fields.  No increased work of breathing. Chest:     Chest wall: No tenderness.  Lymphadenopathy:     Cervical: No cervical adenopathy.  Skin:    Coloration: Skin is not pale.     Findings: No abrasion, erythema, petechiae or rash. Rash is not papular, urticarial or vesicular.  Neurological:     Mental Status: She is alert.  Psychiatric:        Behavior: Behavior is cooperative.      Diagnostic studies: none     Drexel Gentles, MD  [facility] 

## 2023-11-02 ENCOUNTER — Encounter: Payer: Self-pay | Admitting: Allergy & Immunology

## 2023-11-10 ENCOUNTER — Ambulatory Visit: Attending: Internal Medicine | Admitting: Pharmacist

## 2023-11-10 ENCOUNTER — Encounter: Payer: Self-pay | Admitting: Pharmacist

## 2023-11-10 DIAGNOSIS — Z7985 Long-term (current) use of injectable non-insulin antidiabetic drugs: Secondary | ICD-10-CM

## 2023-11-10 DIAGNOSIS — Z7984 Long term (current) use of oral hypoglycemic drugs: Secondary | ICD-10-CM

## 2023-11-10 DIAGNOSIS — E1169 Type 2 diabetes mellitus with other specified complication: Secondary | ICD-10-CM

## 2023-11-10 NOTE — Progress Notes (Signed)
 I connected with  Cheryl [name] on 11/10/23 by a video enabled telemedicine application and verified that I am speaking with Cheryl correct person using two identifiers.   I discussed Cheryl limitations of evaluation and management by telemedicine. Cheryl [name] expressed understanding and agreed to proceed.  Location of myself: office   Location of [name]: home   Persons participating in Cheryl call: Cheryl [name] and myself   S:     No chief complaint on file.  68 y.o. female who presents for diabetes evaluation, education, and management. [name] arrives in good spirits and presents without any assistance.   [name] was referred and last seen by Primary Care Provider, Dr. Lincoln Renshaw, on 10/02/2023. I saw her on 10/09/2023 and helped her get approval under insurance.   PMH is significant for T2DM, HTN, HLD, moderate persistent asthma, OSA on CPAP, morbid obesity, arthritis/LBP, chronic pelvic pain syndrome/pelvic floor dysfunction followed by urology Dr. Judithann Novas.  Today, [name] reports doing well. Denies any changes in vision. No NV or abdominal pain. She started Cheryl Ozempic  1 mg dose and denies any issues with insurance.   Family/Social History:  Fhx: DM Tobacco: never smoker   Alcohol: none reported   Current diabetes medications include: Ozempic  1mg  subcutaneous weekly Current hypertension medications include: lisinopril  10mg  PO daily, trriamterene-hydrochlorothiazide 75-50 mg daily Current hyperlipidemia medications include: rosuvastatin  20mg  PO daily   [name] reports adherence to taking all medications as prescribed.   Insurance coverage: Medicare  [name] denies hypoglycemic events.  [name] denies polyuria. [name] denies neuropathy (nerve pain). [name] denies visual changes. [name] reports self foot exams.   [name] reported dietary habits:  -Notes a decreased appetite since increasing   [name]-reported exercise habits:  -10 minutes every day/counts 8-9,000 steps daily   -Very active with her grandchildren -Notes that her activity has increased overall since last visit  O:  Lab Results  Component Value Date   HGBA1C 5.9 09/10/2023   There were no vitals filed for this visit.  Lipid Panel     Component Value Date/Time   CHOL 142 09/10/2023 1408   TRIG 121 09/10/2023 1408   HDL 54 09/10/2023 1408   CHOLHDL 2.6 09/10/2023 1408   CHOLHDL 3.0 07/23/2018 1045   VLDL 20 11/17/2016 1007   LDLCALC 67 09/10/2023 1408   LDLCALC 86 07/23/2018 1045    Clinical Atherosclerotic Cardiovascular Disease (ASCVD): No  Cheryl 10-year ASCVD risk score (Arnett DK, et al., 2019) is: 19.3%   Values used to calculate Cheryl score:     Age: 63 years     Clincally relevant sex: Female     Is Non-Hispanic African American: Yes     Diabetic: Yes     Tobacco smoker: No     Systolic Blood Pressure: 126 mmHg     Is BP treated: Yes     HDL Cholesterol: 54 mg/dL     Total Cholesterol: 142 mg/dL   [name] is participating in a Managed Medicaid Plan: No   A/P: Diabetes longstanding currently well controlled. She is tolerating Ozempic  well and we are increasing dose to maximize weight loss benefit. She recently got coverage for Cheryl 1mg  pen. [name] is not symptomatic at this time and is able to verbalize appropriate hypoglycemia management plan. Medication adherence appears to be optimal. -Continue Ozempic  1 mg. Will see her via phone in 4 weeks for dose adjustment. -[name] educated on purpose, proper use, and potential adverse effects of Ozempic .  -Extensively discussed pathophysiology of diabetes, recommended lifestyle interventions,  dietary effects on blood sugar control.  -Counseled on s/sx of and management of hypoglycemia.  -Next A1c anticipated 11/2023.   Written [name] instructions provided. [name] verbalized understanding of treatment plan.  Total time in face to face counseling 20 minutes.    Follow-up:  Pharmacist in 1 month  Marene Shape, PharmD, [place],  CPP Clinical Pharmacist [facility] [phone]

## 2023-11-13 ENCOUNTER — Other Ambulatory Visit: Payer: Self-pay | Admitting: Allergy & Immunology

## 2023-11-20 ENCOUNTER — Ambulatory Visit (INDEPENDENT_AMBULATORY_CARE_PROVIDER_SITE_OTHER)

## 2023-11-20 DIAGNOSIS — J309 Allergic rhinitis, unspecified: Secondary | ICD-10-CM

## 2023-11-24 ENCOUNTER — Ambulatory Visit (INDEPENDENT_AMBULATORY_CARE_PROVIDER_SITE_OTHER): Admitting: Podiatry

## 2023-11-24 ENCOUNTER — Encounter: Payer: Self-pay | Admitting: Podiatry

## 2023-11-24 DIAGNOSIS — B351 Tinea unguium: Secondary | ICD-10-CM | POA: Diagnosis not present

## 2023-11-24 DIAGNOSIS — M79672 Pain in left foot: Secondary | ICD-10-CM | POA: Diagnosis not present

## 2023-11-24 DIAGNOSIS — M79671 Pain in right foot: Secondary | ICD-10-CM | POA: Diagnosis not present

## 2023-11-24 DIAGNOSIS — I70209 Unspecified atherosclerosis of native arteries of extremities, unspecified extremity: Secondary | ICD-10-CM | POA: Diagnosis not present

## 2023-11-24 DIAGNOSIS — E1151 Type 2 diabetes mellitus with diabetic peripheral angiopathy without gangrene: Secondary | ICD-10-CM | POA: Diagnosis not present

## 2023-11-24 NOTE — Progress Notes (Signed)
 Patient presents for evaluation and treatment of tenderness and some redness around nails feet.  Tenderness around toes with walking and wearing shoes.  Physical exam:  General appearance: Alert, pleasant, and in no acute distress.  Vascular: Pedal pulses: DP 2/4 B/L, PT 0/4 B/L. Moderate edema lower legs bilaterally  Neurological:    Dermatologic:  Nails thickened, disfigured, discolored 1-5 BL with subungual debris.  Redness and hypertrophic nail folds along nail folds bilaterally but no signs of drainage or infection.  Musculoskeletal:  Hammertoes 2 through 5 bilaterally   Diagnosis: 1. Painful onychomycotic nails 1 through 5 bilaterally. 2. Pain toes 1 through 5 bilaterally. 3.  Diabetes mellitus type 2 with PVD  Plan: Debrided onychomycotic nails 1 through 5 bilaterally.  Return 3 months

## 2023-11-29 ENCOUNTER — Other Ambulatory Visit: Payer: Self-pay | Admitting: Internal Medicine

## 2023-11-29 DIAGNOSIS — M5386 Other specified dorsopathies, lumbar region: Secondary | ICD-10-CM

## 2023-12-11 ENCOUNTER — Telehealth: Payer: Self-pay | Admitting: Internal Medicine

## 2023-12-11 NOTE — Telephone Encounter (Signed)
 Called patient, no answer. Left voicemail confirming upcoming appointment on 12/15/2023 at 3:00 pm with Clinical Pharmacist. Provided callback number for any questions or changes.

## 2023-12-14 ENCOUNTER — Ambulatory Visit (INDEPENDENT_AMBULATORY_CARE_PROVIDER_SITE_OTHER)

## 2023-12-14 DIAGNOSIS — J309 Allergic rhinitis, unspecified: Secondary | ICD-10-CM

## 2023-12-15 ENCOUNTER — Encounter: Payer: Self-pay | Admitting: Pharmacist

## 2023-12-15 ENCOUNTER — Ambulatory Visit: Attending: Internal Medicine | Admitting: Pharmacist

## 2023-12-15 DIAGNOSIS — Z7985 Long-term (current) use of injectable non-insulin antidiabetic drugs: Secondary | ICD-10-CM

## 2023-12-15 DIAGNOSIS — K219 Gastro-esophageal reflux disease without esophagitis: Secondary | ICD-10-CM

## 2023-12-15 DIAGNOSIS — E1169 Type 2 diabetes mellitus with other specified complication: Secondary | ICD-10-CM

## 2023-12-15 MED ORDER — OMEPRAZOLE 40 MG PO CPDR: 40.0000 mg | DELAYED_RELEASE_CAPSULE | Freq: Every day | ORAL | 0 refills | Status: DC

## 2023-12-15 NOTE — Progress Notes (Signed)
 I connected with  Cheryl [name] on 12/15/23 by a video enabled telemedicine application and verified that I am speaking with the correct person using two identifiers.   I discussed the limitations of evaluation and management by telemedicine. The patient expressed understanding and agreed to proceed.  Location of myself: office   Location of patient: home   Persons participating in the call: the patient and myself   S:     No chief complaint on file.  68 y.o. female who presents for diabetes evaluation, education, and management. Patient arrives in good spirits and presents without any assistance.   Patient was referred and last seen by Primary Care Provider, Dr. Vicci, on 10/02/2023. I saw her via telemed on 11/10/2023 and continued Ozempic  1 mg.   PMH is significant for T2DM, HTN, HLD, moderate persistent asthma, OSA on CPAP, morbid obesity, arthritis/LBP, chronic pelvic pain syndrome/pelvic floor dysfunction followed by urology Dr. Octavio.  Today, patient reports doing well. Denies any changes in vision. No NV or abdominal pain. She is taking the Ozempic  1 mg dose and denies any further issues with insurance.   Family/Social History:  Fhx: DM Tobacco: never smoker   Alcohol: none reported   Current diabetes medications include: Ozempic  1mg  subcutaneous weekly Current hypertension medications include: lisinopril  10mg  PO daily, triamterene -hydrochlorothiazide 75-50 mg daily Current hyperlipidemia medications include: rosuvastatin  20mg  PO daily   Patient reports adherence to taking all medications as prescribed.   Insurance coverage: Medicare  Patient denies hypoglycemic events.  Patient denies polyuria. Patient denies neuropathy (nerve pain). Patient denies visual changes. Patient reports self foot exams.   Patient reported dietary habits:  -Notes a decreased appetite since increasing   Patient-reported exercise habits:  -10 minutes every day/counts 8-9,000 steps  daily  -Very active with her grandchildren -Notes that her activity has increased overall since last visit  Patient reported sugars: 80s-90s.  O:  Lab Results  Component Value Date   HGBA1C 5.9 09/10/2023   There were no vitals filed for this visit.  Lipid Panel     Component Value Date/Time   CHOL 142 09/10/2023 1408   TRIG 121 09/10/2023 1408   HDL 54 09/10/2023 1408   CHOLHDL 2.6 09/10/2023 1408   CHOLHDL 3.0 07/23/2018 1045   VLDL 20 11/17/2016 1007   LDLCALC 67 09/10/2023 1408   LDLCALC 86 07/23/2018 1045    Clinical Atherosclerotic Cardiovascular Disease (ASCVD): No  The 10-year ASCVD risk score (Arnett DK, et al., 2019) is: 19.3%   Values used to calculate the score:     Age: 54 years     Clincally relevant sex: Female     Is Non-Hispanic African American: Yes     Diabetic: Yes     Tobacco smoker: No     Systolic Blood Pressure: 126 mmHg     Is BP treated: Yes     HDL Cholesterol: 54 mg/dL     Total Cholesterol: 142 mg/dL   Patient is participating in a Managed Medicaid Plan: No   A/P: Diabetes longstanding currently well controlled. She is tolerating Ozempic  well and we are increasing dose to maximize weight loss benefit. She recently got coverage for the 1mg  pen. Patient is not symptomatic at this time and is able to verbalize appropriate hypoglycemia management plan. Medication adherence appears to be optimal. -Continue Ozempic  1 mg. With the heat, pt is endorsing some days during the week of dyspepsia and feeling sick to her stomach. She does not attribute this to  the Ozempic  but we will need to monitor. Will keep the 1 mg dose for now. -Patient educated on purpose, proper use, and potential adverse effects of Ozempic .  -Extensively discussed pathophysiology of diabetes, recommended lifestyle interventions, dietary effects on blood sugar control.  -Counseled on s/sx of and management of hypoglycemia.  -Next A1c anticipated 01/2024.   Written patient  instructions provided. Patient verbalized understanding of treatment plan.  Total time in face to face counseling 20 minutes.    Follow-up:  PCP in September.  Cheryl [name], PharmD, Cheryl [name], CPP Clinical Pharmacist [facility] [phone]

## 2023-12-17 ENCOUNTER — Other Ambulatory Visit: Payer: Self-pay | Admitting: Internal Medicine

## 2023-12-21 ENCOUNTER — Ambulatory Visit (INDEPENDENT_AMBULATORY_CARE_PROVIDER_SITE_OTHER)

## 2023-12-21 DIAGNOSIS — J309 Allergic rhinitis, unspecified: Secondary | ICD-10-CM

## 2023-12-22 NOTE — Procedures (Signed)
Mask fit

## 2024-01-10 ENCOUNTER — Other Ambulatory Visit: Payer: Self-pay | Admitting: Internal Medicine

## 2024-01-10 DIAGNOSIS — E1159 Type 2 diabetes mellitus with other circulatory complications: Secondary | ICD-10-CM

## 2024-01-18 ENCOUNTER — Ambulatory Visit (INDEPENDENT_AMBULATORY_CARE_PROVIDER_SITE_OTHER)

## 2024-01-18 DIAGNOSIS — J309 Allergic rhinitis, unspecified: Secondary | ICD-10-CM

## 2024-02-01 ENCOUNTER — Telehealth: Payer: Self-pay | Admitting: Internal Medicine

## 2024-02-01 NOTE — Telephone Encounter (Signed)
 Confirmed appt.

## 2024-02-02 ENCOUNTER — Ambulatory Visit: Attending: Internal Medicine | Admitting: Internal Medicine

## 2024-02-02 ENCOUNTER — Encounter: Payer: Self-pay | Admitting: Internal Medicine

## 2024-02-02 DIAGNOSIS — Z6839 Body mass index (BMI) 39.0-39.9, adult: Secondary | ICD-10-CM | POA: Diagnosis not present

## 2024-02-02 DIAGNOSIS — E1159 Type 2 diabetes mellitus with other circulatory complications: Secondary | ICD-10-CM

## 2024-02-02 DIAGNOSIS — M1990 Unspecified osteoarthritis, unspecified site: Secondary | ICD-10-CM | POA: Insufficient documentation

## 2024-02-02 DIAGNOSIS — E1169 Type 2 diabetes mellitus with other specified complication: Secondary | ICD-10-CM | POA: Diagnosis not present

## 2024-02-02 DIAGNOSIS — Z79899 Other long term (current) drug therapy: Secondary | ICD-10-CM | POA: Diagnosis not present

## 2024-02-02 DIAGNOSIS — G4733 Obstructive sleep apnea (adult) (pediatric): Secondary | ICD-10-CM | POA: Diagnosis not present

## 2024-02-02 DIAGNOSIS — M545 Low back pain, unspecified: Secondary | ICD-10-CM | POA: Diagnosis not present

## 2024-02-02 DIAGNOSIS — I152 Hypertension secondary to endocrine disorders: Secondary | ICD-10-CM

## 2024-02-02 DIAGNOSIS — E785 Hyperlipidemia, unspecified: Secondary | ICD-10-CM | POA: Diagnosis not present

## 2024-02-02 DIAGNOSIS — I10 Essential (primary) hypertension: Secondary | ICD-10-CM | POA: Diagnosis present

## 2024-02-02 DIAGNOSIS — N9489 Other specified conditions associated with female genital organs and menstrual cycle: Secondary | ICD-10-CM | POA: Insufficient documentation

## 2024-02-02 DIAGNOSIS — K7581 Nonalcoholic steatohepatitis (NASH): Secondary | ICD-10-CM | POA: Insufficient documentation

## 2024-02-02 DIAGNOSIS — K5900 Constipation, unspecified: Secondary | ICD-10-CM

## 2024-02-02 DIAGNOSIS — J4540 Moderate persistent asthma, uncomplicated: Secondary | ICD-10-CM | POA: Insufficient documentation

## 2024-02-02 DIAGNOSIS — R1032 Left lower quadrant pain: Secondary | ICD-10-CM | POA: Diagnosis not present

## 2024-02-02 DIAGNOSIS — Z7985 Long-term (current) use of injectable non-insulin antidiabetic drugs: Secondary | ICD-10-CM | POA: Diagnosis not present

## 2024-02-02 DIAGNOSIS — K760 Fatty (change of) liver, not elsewhere classified: Secondary | ICD-10-CM

## 2024-02-02 DIAGNOSIS — K219 Gastro-esophageal reflux disease without esophagitis: Secondary | ICD-10-CM

## 2024-02-02 DIAGNOSIS — E119 Type 2 diabetes mellitus without complications: Secondary | ICD-10-CM | POA: Diagnosis present

## 2024-02-02 DIAGNOSIS — M6289 Other specified disorders of muscle: Secondary | ICD-10-CM | POA: Diagnosis not present

## 2024-02-02 DIAGNOSIS — R1030 Lower abdominal pain, unspecified: Secondary | ICD-10-CM | POA: Diagnosis not present

## 2024-02-02 LAB — POCT GLYCOSYLATED HEMOGLOBIN (HGB A1C): Hemoglobin A1C: 5.7 % — AB (ref 4.0–5.6)

## 2024-02-02 MED ORDER — CIPROFLOXACIN HCL 500 MG PO TABS: 500.0000 mg | ORAL_TABLET | Freq: Two times a day (BID) | ORAL | 0 refills | Status: AC

## 2024-02-02 MED ORDER — METRONIDAZOLE 500 MG PO TABS: 500.0000 mg | ORAL_TABLET | Freq: Two times a day (BID) | ORAL | 0 refills | Status: DC

## 2024-02-02 NOTE — Progress Notes (Signed)
 Patient ID: Cheryl [name], female    DOB: 06-Feb-1956  MRN: [id number]  CC: chronic ds management   Subjective: Cheryl [name] is a 68 y.o. female who presents for chronic ds management. Her concerns today include:  Pt with hx of DM type 2, HTN, HL, moderate persistent asthma, OSA on CPAP, morbid obesity/MALSD, arthritis/LBP, chronic pelvic pain syndrome/pelvic floor dysfunction followed by urology Dr. Octavio   Discussed the use of AI scribe software for clinical note transcription with the patient, who gave verbal consent to proceed.  History of Present Illness Cheryl [name] is a 68 year old female with diabetes, hypertension, and hyperlipidemia who presents for follow-up of her chronic medical conditions.  DM/Obesity: She has been on Ozempic  1 mg weekly since May and has lost 21 pounds over four months.  Drinks sugar free juices. Her blood sugars range from 87 to 105 mg/dL, and she checks them three times a day.  She feels 'sick on the stomach' and 'real tired,' attributing these symptoms to allergies. No nausea, vomiting, or diarrhea, except for one episode of vomiting after eating fried fish. She experiences intermittent stomach pain associated with food intake and tries to eat less to alleviate symptoms. She also reports burping and uses ginger ale for relief. She takes omeprazole  40 mg daily. She mentions intermittent left lower abdominal pain since December when she was seen in the ER for acute abdominal pain.  CAT scan at that time showed rectal wall thickening with surrounding inflammation worrisome for proctitis.  Gallbladder ultrasound was negative for gallstones. She was treated with antibiotics at that time with improvement. No current pain or burning with urination. Seen by Cheryl [name]  She has a history of hypertension and is currently on Maxidex  75/50 daily and lisinopril  10 mg. She checks her blood pressure weekly, with readings around 119/80 mmHg. She limits salt intake and  occasionally consumes caffeine. No chest pain or shortness of breath.  For hyperlipidemia, she takes rosuvastatin  20 mg daily. Her cholesterol levels were last checked in April, with an LDL of 67 mg/dL. Her A1c was 5.7% at the last check.  She reports constipation and uses Colace nightly, which helps her have regular bowel movements. She also uses a fiber supplement occasionally.      Patient Active Problem List   Diagnosis Date Noted   Hyperlipidemia associated with type 2 diabetes mellitus (HCC) 01/25/2021   Type 2 diabetes mellitus with morbid obesity (HCC) 01/25/2021   Moderate persistent asthma, uncomplicated 11/04/2018   OSA (obstructive sleep apnea) 12/02/2016   Hyperlipidemia LDL goal <100 10/21/2014   Essential hypertension, benign 10/21/2014   DM w/o complication type II (HCC) 09/13/2014   Routine general medical examination at a health care facility 10/12/2013   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 05/03/2013   Need for prophylactic vaccination and inoculation against influenza 02/08/2013   GERD (gastroesophageal reflux disease) 02/08/2013   Other and unspecified hyperlipidemia 11/10/2012   Type II or unspecified type diabetes mellitus without mention of complication, not stated as uncontrolled 10/25/2012   Impaired fasting glucose 09/07/2012   Morbid obesity (HCC) 09/07/2012   Generalized osteoarthrosis, involving multiple sites 09/07/2012   Carpal tunnel syndrome 09/07/2012   Other, multiple, and unspecified sites, insect bite, nonvenomous, without mention of infection(919.4) 09/07/2012   Special screening for malignant neoplasms, colon 09/07/2012   Other specified cardiac dysrhythmias(427.89) 09/07/2012   Candidiasis of vulva and vagina 09/07/2012   Abdominal pain, generalized 09/07/2012   Symptomatic menopausal or  female climacteric states 09/07/2012   Hypertension associated with diabetes (HCC) 09/07/2012   Perennial allergic rhinitis 09/07/2012   Pain  in joint 09/07/2012   Other malaise and fatigue 09/07/2012     Current Outpatient Medications on File Prior to Visit  Medication Sig Dispense Refill   AMBULATORY NON FORMULARY MEDICATION Medication Name: Allergy  Injection- every 4 weeks  I take it once a week 01/14/23     aspirin 81 MG tablet Take 81 mg by mouth daily.     Azelastine  HCl 137 MCG/SPRAY SOLN PLACE 1-2 SPRAYS IN EACH NOSTRIL ONCE DAILY. 90 mL 1   B-D ULTRA-FINE 33 LANCETS MISC Use to test blood sugar once daily. Dx: E11.9 100 each 6   budesonide -formoterol  (SYMBICORT ) 160-4.5 MCG/ACT inhaler TAKE 2 PUFFS BY MOUTH TWICE A DAY 30.6 each 5   Calcium  Carbonate-Vitamin D  600-400 MG-UNIT chew tablet Chew 1 tablet by mouth daily.      EPINEPHRINE  0.3 mg/0.3 mL IJ SOAJ injection INJECT 0.3 MG INTO THE MUSCLE AS NEEDED FOR ANAPHYLAXIS. 2 each 1   fluticasone  (FLONASE ) 50 MCG/ACT nasal spray PLACE 1-2 SPRAYS INTO BOTH NOSTRILS DAILY. 48 mL 2   gabapentin  (NEURONTIN ) 300 MG capsule TAKE 1 CAPSULE BY MOUTH EVERYDAY AT BEDTIME 90 capsule 1   levocetirizine (XYZAL ) 5 MG tablet TAKE 1 TABLET BY MOUTH EVERY DAY IN THE EVENING 90 tablet 1   lisinopril  (ZESTRIL ) 10 MG tablet TAKE 1 TABLET BY MOUTH EVERY DAY 90 tablet 1   Multiple Vitamin (MULTIVITAMIN ADULT PO)      omeprazole  (PRILOSEC) 40 MG capsule Take 1 capsule (40 mg total) by mouth daily. 90 capsule 0   Respiratory Therapy Supplies (CARETOUCH 2 CPAP HOSE HANGER) MISC      rosuvastatin  (CRESTOR ) 20 MG tablet TAKE 1 TABLET BY MOUTH EVERY DAY 90 tablet 1   Semaglutide , 1 MG/DOSE, 4 MG/3ML SOPN Inject 1 mg as directed once a week. 3 mL 2   Spacer/Aero-Holding Chambers DEVI 1 each by Does not apply route as needed. 1 each 0   triamterene -hydrochlorothiazide (MAXZIDE) 75-50 MG tablet TAKE 1 TABLET BY MOUTH EVERY DAY 90 tablet 1   vitamin C (ASCORBIC ACID) 500 MG tablet Take 500 mg by mouth every other day.     No current facility-administered medications on file prior to visit.     Allergies  Allergen Reactions   Misc. Sulfonamide Containing Compounds Diarrhea   Elemental Sulfur Diarrhea   Oxycodone Nausea Only and Other (See Comments)    Had stomach and headache as side effect from medicine    Social History   Socioeconomic History   Marital status: Divorced    Spouse name: Not on file   Number of children: Not on file   Years of education: Not on file   Highest education level: Not on file  Occupational History   Not on file  Tobacco Use   Smoking status: Never   Smokeless tobacco: Never  Vaping Use   Vaping status: Never Used  Substance and Sexual Activity   Alcohol use: Yes    Comment: Seldom- Wine    Drug use: No   Sexual activity: Not Currently    Comment: post office worker  Other Topics Concern   Not on file  Social History Narrative   Not on file   Social Drivers of Health   Financial Resource Strain: Low Risk  (03/10/2023)   Overall Financial Resource Strain (CARDIA)    Difficulty of Paying Living Expenses: Not hard at  all  Food Insecurity: No Food Insecurity (03/10/2023)   Hunger Vital Sign    Worried About Running Out of Food in the Last Year: Never true    Ran Out of Food in the Last Year: Never true  Transportation Needs: No Transportation Needs (03/10/2023)   PRAPARE - Administrator, Civil Service (Medical): No    Lack of Transportation (Non-Medical): No  Physical Activity: Insufficiently Active (03/10/2023)   Exercise Vital Sign    Days of Exercise per Week: 3 days    Minutes of Exercise per Session: 30 min  Stress: No Stress Concern Present (03/10/2023)   [employer] of Occupational Health - Occupational Stress Questionnaire    Feeling of Stress : Only a little  Social Connections: Socially Isolated (03/10/2023)   Social Connection and Isolation Panel    Frequency of Communication with Friends and Family: More than three times a week    Frequency of Social Gatherings with Friends and Family: Never     Attends Religious Services: Never    Database administrator or Organizations: No    Attends Banker Meetings: Never    Marital Status: Never married  Intimate Partner Violence: Not At Risk (03/10/2023)   Humiliation, Afraid, Rape, and Kick questionnaire    Fear of Current or Ex-Partner: No    Emotionally Abused: No    Physically Abused: No    Sexually Abused: No    Family History  Problem Relation Age of Onset   Cancer Father    Diabetes Father    Allergic rhinitis Son    Diabetes Brother    Allergic rhinitis Son    Diabetes Paternal Aunt    Lung cancer Cousin    Asthma Neg Hx     Past Surgical History:  Procedure Laterality Date   ABDOMINAL HYSTERECTOMY     1994   carpal tunnel both hands     2012   CLOSED MANIPULATION SHOULDER Left 04/2014   EYE SURGERY Left 08/25/2015   EYE SURGERY Right 09/24/2015   JOINT REPLACEMENT     both knees replacement, 2010   mole removed from face     Nodule removed from back     SHOULDER SURGERY Left 11/2013   TONSILLECTOMY     as teenager    ROS: Review of Systems Negative except as stated above  PHYSICAL EXAM: BP (!) 144/60 (BP Location: Right Leg, Patient Position: Sitting, Cuff Size: Normal)   Pulse 66   Temp 98 F (36.7 C) (Oral)   Ht 5' 7 (1.702 m)   Wt 255 lb 6.4 oz (115.8 kg)   SpO2 98%   BMI 40.00 kg/m   Wt Readings from Last 3 Encounters:  02/02/24 255 lb 6.4 oz (115.8 kg)  10/29/23 271 lb 14.4 oz (123.3 kg)  10/16/23 272 lb 12.8 oz (123.7 kg)    Physical Exam  {female adult master:[id number]}      Latest Ref Rng & Units 05/12/2023    3:14 PM 02/03/2023    2:22 PM 01/01/2023   10:08 AM  CMP  Glucose 70 - 99 mg/dL 867   885   BUN 8 - 23 mg/dL 15   18   Creatinine 9.55 - 1.00 mg/dL 9.15   9.18   Sodium 864 - 145 mmol/L 135   142   Potassium 3.5 - 5.1 mmol/L 4.4   3.9   Chloride 98 - 111 mmol/L 96   103   CO2 22 - 32  mmol/L 24   23   Calcium  8.9 - 10.3 mg/dL 9.8   89.8   Total Protein  6.5 - 8.1 g/dL 7.5  7.2    Total Bilirubin <1.2 mg/dL 0.6  <9.7    Alkaline Phos 38 - 126 U/L 56  76    AST 15 - 41 U/L 31  18    ALT 0 - 44 U/L 18  16     Lipid Panel     Component Value Date/Time   CHOL 142 09/10/2023 1408   TRIG 121 09/10/2023 1408   HDL 54 09/10/2023 1408   CHOLHDL 2.6 09/10/2023 1408   CHOLHDL 3.0 07/23/2018 1045   VLDL 20 11/17/2016 1007   LDLCALC 67 09/10/2023 1408   LDLCALC 86 07/23/2018 1045    CBC    Component Value Date/Time   WBC 10.9 (H) 05/12/2023 1514   RBC 4.99 05/12/2023 1514   HGB 14.1 05/12/2023 1514   HGB 13.7 02/03/2023 1422   HCT 41.7 05/12/2023 1514   HCT 41.1 02/03/2023 1422   PLT 353 05/12/2023 1514   PLT 374 02/03/2023 1422   MCV 83.6 05/12/2023 1514   MCV 86 02/03/2023 1422   MCH 28.3 05/12/2023 1514   MCHC 33.8 05/12/2023 1514   RDW 14.7 05/12/2023 1514   RDW 14.4 02/03/2023 1422   LYMPHSABS 1.4 05/12/2023 1514   LYMPHSABS 2.3 07/29/2018 1443   MONOABS 0.8 05/12/2023 1514   EOSABS 0.1 05/12/2023 1514   EOSABS 0.2 07/29/2018 1443   BASOSABS 0.1 05/12/2023 1514   BASOSABS 0.0 07/29/2018 1443    ASSESSMENT AND PLAN:  Assessment and Plan Assessment & Plan Type 2 diabetes mellitus Diabetes well-controlled with Ozempic . A1c at 5.7%, stable glucose levels, and 21-pound weight loss. Noted gastrointestinal side effects possibly from Ozempic . Discussed risks of pancreatitis, gallbladder issues, or gastrointestinal symptoms. - Check lipase level for pancreatitis. - Consider pausing or reducing Ozempic  to 0.5 mg if lipase normal. - Avoid greasy and fatty foods.  Hypertension Hypertension managed with Maxidex  and lisinopril . Blood pressure within target range at 122/80 mmHg. - Continue Maxidex  75/50 mg daily and lisinopril  10 mg daily. - Monitor blood pressure regularly, target <130/80 mmHg. - Limit salt and caffeine intake.  Hyperlipidemia Hyperlipidemia controlled with rosuvastatin . LDL at 67 mg/dL, below target for  diabetes. - Continue rosuvastatin  20 mg daily.  Gastroesophageal reflux disease GERD managed with omeprazole . Symptoms may be diet-related. - Continue omeprazole  40 mg daily. - Avoid acidic juices and trigger foods.  Chronic constipation Constipation managed with Colace, resulting in regular bowel movements. - Continue Colace nightly.  Nonalcoholic fatty liver disease NAFLD noted on imaging, not contributing to current symptoms.  Left lower abdominal pain, possible diverticulitis Intermittent left lower abdominal pain, possibly diverticulitis. Previous imaging showed colon inflammation. - Order urine test for infection and protein. - Prescribe antibiotics for one week for possible diverticulitis. - Refer to gastroenterologist.  General Health Maintenance Discussed flu vaccination, considering high-dose vaccine. - Schedule nurse-only visit in 1-2 weeks for high-dose flu vaccine.     There are no diagnoses linked to this encounter.   Patient was given the opportunity to ask questions.  Patient verbalized understanding of the plan and was able to repeat key elements of the plan.   This documentation was completed using Paediatric nurse.  Any transcriptional errors are unintentional.  No orders of the defined types were placed in this encounter.    Requested Prescriptions    No prescriptions requested or ordered  in this encounter    No follow-ups on file.  Barnie Louder, MD, FACP

## 2024-02-02 NOTE — Patient Instructions (Signed)
 VISIT SUMMARY:  You came in today for a follow-up on your chronic conditions, including diabetes, hypertension, and hyperlipidemia. We discussed your recent symptoms and reviewed your current medications and their effectiveness. We also talked about some new symptoms you have been experiencing and made adjustments to your treatment plan accordingly.  YOUR PLAN:  -TYPE 2 DIABETES MELLITUS: Your diabetes is well-controlled with your current medication, Ozempic , as shown by your stable glucose levels and recent A1c of 5.7%. However, you have been experiencing some stomach issues that might be related to Ozempic . We will check your lipase levels to rule out pancreatitis. If the levels are normal, we may reduce your Ozempic  dose to 0.5 mg. Please avoid greasy and fatty foods to help manage your symptoms.  -HYPERTENSION: Your blood pressure is well-managed with your current medications, Maxidex  and lisinopril , with readings around 122/80 mmHg. Continue taking these medications as prescribed and monitor your blood pressure regularly. Aim to keep your blood pressure below 130/80 mmHg. Also, continue to limit your salt and caffeine intake.  -HYPERLIPIDEMIA: Your cholesterol levels are well-controlled with rosuvastatin , with your LDL at 67 mg/dL, which is below the target for diabetes. Continue taking rosuvastatin  20 mg daily.  -GASTROESOPHAGEAL REFLUX DISEASE (GERD): Your GERD is being managed with omeprazole . Continue taking omeprazole  40 mg daily and avoid acidic juices and foods that trigger your symptoms.  -CHRONIC CONSTIPATION: Your constipation is being managed with Colace, which helps you have regular bowel movements. Continue taking Colace nightly.  -NONALCOHOLIC FATTY LIVER DISEASE (NAFLD): You have a condition called nonalcoholic fatty liver disease, which means there is fat in your liver not caused by alcohol. It is not contributing to your current symptoms, so no changes are needed at this  time.  -LEFT LOWER ABDOMINAL PAIN, POSSIBLE DIVERTICULITIS: You have been experiencing intermittent left lower abdominal pain, which might be due to diverticulitis, an inflammation of the colon. We will order a urine test to check for infection and prescribe antibiotics for one week. You will also be referred to a gastroenterologist for further evaluation.  INSTRUCTIONS:  Please schedule a nurse-only visit in 1-2 weeks for a high-dose flu vaccine. Additionally, follow up with the gastroenterologist as referred.

## 2024-02-03 ENCOUNTER — Other Ambulatory Visit: Payer: Self-pay | Admitting: Internal Medicine

## 2024-02-03 ENCOUNTER — Encounter: Payer: Self-pay | Admitting: Internal Medicine

## 2024-02-03 ENCOUNTER — Ambulatory Visit: Payer: Self-pay | Admitting: Internal Medicine

## 2024-02-03 DIAGNOSIS — Z1231 Encounter for screening mammogram for malignant neoplasm of breast: Secondary | ICD-10-CM

## 2024-02-04 LAB — URINALYSIS, ROUTINE W REFLEX MICROSCOPIC
Bilirubin, UA: NEGATIVE
Glucose, UA: NEGATIVE
Ketones, UA: NEGATIVE
Leukocytes,UA: NEGATIVE
Nitrite, UA: NEGATIVE
Protein,UA: NEGATIVE
RBC, UA: NEGATIVE
Specific Gravity, UA: 1.012 (ref 1.005–1.030)
Urobilinogen, Ur: 0.2 mg/dL (ref 0.2–1.0)
pH, UA: 7.5 (ref 5.0–7.5)

## 2024-02-04 LAB — COMPREHENSIVE METABOLIC PANEL WITH GFR
ALT: 15 IU/L (ref 0–32)
AST: 17 IU/L (ref 0–40)
Albumin: 5.3 g/dL — ABNORMAL HIGH (ref 3.9–4.9)
Alkaline Phosphatase: 69 IU/L (ref 44–121)
BUN/Creatinine Ratio: 10 — ABNORMAL LOW (ref 12–28)
BUN: 9 mg/dL (ref 8–27)
Bilirubin Total: 0.4 mg/dL (ref 0.0–1.2)
CO2: 25 mmol/L (ref 20–29)
Calcium: 10.5 mg/dL — ABNORMAL HIGH (ref 8.7–10.3)
Chloride: 97 mmol/L (ref 96–106)
Creatinine, Ser: 0.88 mg/dL (ref 0.57–1.00)
Globulin, Total: 2.6 g/dL (ref 1.5–4.5)
Glucose: 83 mg/dL (ref 70–99)
Potassium: 3.7 mmol/L (ref 3.5–5.2)
Sodium: 140 mmol/L (ref 134–144)
Total Protein: 7.9 g/dL (ref 6.0–8.5)
eGFR: 71 mL/min/1.73 (ref >59)

## 2024-02-04 LAB — MICROALBUMIN / CREATININE URINE RATIO
Creatinine, Urine: 60.7 mg/dL
Microalb/Creat Ratio: 5 mg/g{creat} (ref 0–29)
Microalbumin, Urine: 3 ug/mL

## 2024-02-04 LAB — LIPASE: Lipase: 52 U/L (ref 14–72)

## 2024-02-05 ENCOUNTER — Telehealth: Payer: Self-pay

## 2024-02-05 NOTE — Telephone Encounter (Signed)
 Patient was called and informed that the medication was sent to take if she has another abdominal flare up.     Copied from CRM #[id number]. Topic: Clinical - Medical Advice >> Feb 04, 2024  3:04 PM Berwyn MATSU wrote: Reason for CRM: Patient called in requesting to know if she should still drink the antibiotics that were prescribed to her on 02/02/24. Per patient she states her labs came back normal so should she still drink the medication.   ciprofloxacin  (CIPRO ) 500 MG tablet [[id number]] and metroNIDAZOLE  (FLAGYL ) 500 MG tablet [[id number]]  May you please advise.

## 2024-02-10 ENCOUNTER — Ambulatory Visit
Admission: RE | Admit: 2024-02-10 | Discharge: 2024-02-10 | Disposition: A | Discharge status: Home / Self Care | Source: Ambulatory Visit | Attending: Internal Medicine | Admitting: Internal Medicine

## 2024-02-10 DIAGNOSIS — Z1231 Encounter for screening mammogram for malignant neoplasm of breast: Secondary | ICD-10-CM | POA: Diagnosis not present

## 2024-02-13 ENCOUNTER — Ambulatory Visit: Payer: Self-pay | Admitting: Internal Medicine

## 2024-02-16 ENCOUNTER — Ambulatory Visit: Attending: Internal Medicine

## 2024-02-16 DIAGNOSIS — Z23 Encounter for immunization: Secondary | ICD-10-CM

## 2024-02-16 NOTE — Progress Notes (Signed)
Flu vaccine administered in left deltoid per protocols.  Information sheet given. Patient denies and pain or discomfort at injection site. Tolerated injection well no reaction.

## 2024-02-22 ENCOUNTER — Ambulatory Visit (INDEPENDENT_AMBULATORY_CARE_PROVIDER_SITE_OTHER)

## 2024-02-22 DIAGNOSIS — J309 Allergic rhinitis, unspecified: Secondary | ICD-10-CM

## 2024-02-22 MED ORDER — EPINEPHRINE 0.3 MG/0.3ML IJ SOAJ
0.3000 mg | INTRAMUSCULAR | 1 refills | Status: AC | PRN
Start: 2024-02-22 — End: ?

## 2024-02-22 NOTE — Progress Notes (Unsigned)
 Cheryl [name]    [id number]    1956-05-12  Primary Care Physician:Carter, [place], DO  HPI female never smoker followed for OSA, complicated by asthma( Dr Theophilus), DM 2, HBP, GERD, allergic rhinitis/allergy  vaccine (Dr Frutoso), glaucoma NPSG- 11/25/16- AHI 40.3/hour, desaturation to 88%, body weight 286 pounds  ----------------------------------------------------------------   02/23/23- 68 year old female never smoker(son is Sports administrator) followed for OSA, complicated by Asthma( Dr Theophilus), DM 2, HBP, GERD, Allergic Rhinitis/Allergy  Vaccine   -Albuterol  hfa, Symbicort  160,  CPAP auto 5-15/ [facility]            AirSense 10 AutoSet Download-compliance 100%, AHI 6/hr (from last machine up to August 6) Body weight today-275  lbs Download reviewed. Had flu vax. Machine recently replaced. Working fine, but AirView not installed. Had Covid last December and sinusitis in June. Both resolved and she feels well now.Has had flu and covid vax.  10/16/23-  Obesity not improved. Discussed the use of AI scribe software for clinical note transcription with the patient, who gave verbal consent to proceed.  History of Present Illness   Cheryl [name] is a 68 year old female never smoker(son is Sports administrator) followed for OSA, complicated by Asthma( Dr Theophilus), DM 2, HBP, GERD, Allergic Rhinitis(Allergy  Vaccine), -Albuterol  hfa, Symbicort  160,  CPAP auto 5-15/ [facility]            AirSense 10 AutoSet Download-compliance not available- AirView expired Body weight today-79 lbs60 year old female who presents for CPAP management.  Her CPAP machine is functioning well, and she reports improved sleep quality, describing it as 'sleeping like a baby' and stating that 'life is better'. Occasionally, she experiences a bad night of sleep, which she does not attribute to the CPAP machine. The Airview software on her CPAP machine has expired, and she brought the machine for evaluation. No respiratory issues are present.  There is no SD card. We will ask DME to address.     Assessment and Plan:    Obstructive Sleep Apnea Obstructive sleep apnea well-managed with CPAP. Good sleep quality and improved life quality reported. No CPAP issues or breathing difficulties. Current settings effective. - Order SD card installation in CPAP for data monitoring. - Instruct her to bring SD card to appointments for review. - Continue current CPAP settings and supplies with DME company, [employer]. - Provide after-visit summary for DME if needed.     Obesity - Encourage long term effort   02/23/24- 68 year old female never smoker(son is Sports administrator) followed for OSA, complicated by Asthma( Dr Theophilus), DM 2, HBP, GERD, Allergic Rhinitis(Allergy  Vaccine), -Albuterol  hfa, Symbicort  160,  CPAP auto 5-25/ [facility]            AirSense 10 AutoSet Download-compliance -100%, AHI 4.4/hr Body weight today-256 lbs Doing well- benefits from CPAP. Discussed the use of AI scribe software for clinical note transcription with the patient, who gave verbal consent to proceed.  History of Present Illness   Cheryl [name] is a 68 year old female with sleep apnea and asthma who presents for a follow-up on CPAP therapy.  She uses her CPAP machine consistently every night with a pressure range set between five and twenty. She experiences less than five breakthrough apneas per hour. Occasionally, she has a bad night's sleep, but it is infrequent. She averages seven to eight hours of sleep most nights, as tracked by her watch. She is satisfied she is better off with CPAP.  She breathes well except during poor weather conditions,  which she attributes to environmental factors. She does not use her rescue inhaler, albuterol , regularly as she feels it is unnecessary. She attempts to use it at least once a day to assess its effect but can go weeks without it if asymptomatic. She uses Symbicort  as her maintenance inhaler daily and finds it effective. Symptoms recur  if she stops using it for a couple of days. She does not currently need refills for her inhalers.     Assessment and Plan:    Obstructive sleep apnea Obstructive sleep apnea well-managed with CPAP therapy, less than five apneas per hour, adequate rest. - Continue CPAP settings 5-20 cm H2O. - Encourage nightly CPAP use.  Asthma moderate persistent uncomplicated - Continue daily Symbicort . - Use Albuterol  as needed, up to every six hours.      ROS-see HPI   + = positive Constitutional:    weight loss, night sweats, fevers, chills, fatigue, lassitude. HEENT:    headaches, difficulty swallowing, tooth/dental problems, sore throat,       sneezing, itching, ear ache, nasal congestion, post nasal drip, snoring CV:    chest pain, orthopnea, PND, swelling in lower extremities, anasarca,                                            dizziness, palpitations Resp:   shortness of breath with exertion or at rest.                productive cough,   non-productive cough, coughing up of blood.              change in color of mucus.  wheezing.   Skin:    rash or lesions. GI:  No-   heartburn, indigestion, abdominal pain, nausea, vomiting, diarrhea,                 change in bowel habits, loss of appetite GU: dysuria, change in color of urine, no urgency or frequency.   flank pain. MS:   joint pain, stiffness, decreased range of motion, back pain. Neuro-    +syncopal episode Psych:  change in mood or affect.  depression or anxiety.   memory loss.  OBJ- Physical Exam General- Alert, Oriented, Affect-appropriate, Distress- none acute, + morbidly obese Skin- rash-none, lesions- none, excoriation- none Lymphadenopathy- none Head- atraumatic            Eyes- Gross vision intact, PERRLA, conjunctivae and secretions clear            Ears- Hearing, canals-normal            Nose- Clear, no-Septal dev, mucus, polyps, erosion, perforation             Throat- Mallampati IV , mucosa clear , drainage- none,  tonsils- atrophic, + own teeth Neck- flexible , trachea midline, no stridor , thyroid  nl, carotid no bruit Chest - symmetrical excursion , unlabored           Heart/CV- RRR , +1/6S murmur , no gallop  , no rub, nl s1 s2                           - JVD- none , edema- none, stasis changes- none, varices- none           Lung- clear to P&A, wheeze- none, cough- none , dullness-none, rub- none  Chest wall-  Abd-  Br/ Gen/ Rectal- Not done, not indicated Extrem- cyanosis- none, clubbing, none, atrophy- none, strength- nl Neuro- grossly intact to observation

## 2024-02-23 ENCOUNTER — Encounter: Payer: Self-pay | Admitting: Internal Medicine

## 2024-02-23 ENCOUNTER — Ambulatory Visit (INDEPENDENT_AMBULATORY_CARE_PROVIDER_SITE_OTHER): Payer: Medicare Other | Admitting: Internal Medicine

## 2024-02-23 VITALS — BP 110/64 | HR 67 | Temp 98.7°F | Ht 68.0 in | Wt 256.0 lb

## 2024-02-23 DIAGNOSIS — G4733 Obstructive sleep apnea (adult) (pediatric): Secondary | ICD-10-CM | POA: Diagnosis not present

## 2024-02-23 DIAGNOSIS — J4540 Moderate persistent asthma, uncomplicated: Secondary | ICD-10-CM

## 2024-02-23 NOTE — Patient Instructions (Signed)
Glad you are doing well.  We can continue CPAP auto 5-20  Please call if we can help 

## 2024-02-24 ENCOUNTER — Ambulatory Visit: Admitting: Podiatry

## 2024-02-26 ENCOUNTER — Encounter: Payer: Self-pay | Admitting: Internal Medicine

## 2024-03-04 DIAGNOSIS — Z23 Encounter for immunization: Secondary | ICD-10-CM | POA: Diagnosis not present

## 2024-03-11 ENCOUNTER — Other Ambulatory Visit: Payer: Self-pay | Admitting: Internal Medicine

## 2024-03-12 ENCOUNTER — Other Ambulatory Visit: Payer: Self-pay | Admitting: Internal Medicine

## 2024-03-12 DIAGNOSIS — K219 Gastro-esophageal reflux disease without esophagitis: Secondary | ICD-10-CM

## 2024-03-15 ENCOUNTER — Ambulatory Visit: Attending: Internal Medicine

## 2024-03-15 ENCOUNTER — Ambulatory Visit

## 2024-03-15 VITALS — BP 112/72 | HR 65 | Temp 98.2°F | Ht 68.0 in | Wt 252.4 lb

## 2024-03-15 DIAGNOSIS — Z0000 Encounter for general adult medical examination without abnormal findings: Secondary | ICD-10-CM | POA: Diagnosis not present

## 2024-03-15 DIAGNOSIS — J309 Allergic rhinitis, unspecified: Secondary | ICD-10-CM | POA: Diagnosis not present

## 2024-03-15 NOTE — Progress Notes (Signed)
 Subjective:   Cheryl [name] is a 68 y.o. who presents for a Medicare Wellness preventive visit.  As a reminder, Annual Wellness Visits don't include a physical exam, and some assessments may be limited, especially if this visit is performed virtually. We may recommend an in-person follow-up visit with your provider if needed.  Visit Complete: In person  Persons Participating in Visit: Patient.  AWV Questionnaire: Yes: Patient Medicare AWV questionnaire was completed by the patient on 03/14/2024; I have confirmed that all information answered by patient is correct and no changes since this date.  Cardiac Risk Factors include: advanced age (>59men, >32 women);diabetes mellitus;hypertension;obesity (BMI >30kg/m2)     Objective:    Today's Vitals   03/15/24 1525  BP: 112/72  Pulse: 65  Temp: 98.2 F (36.8 C)  TempSrc: Oral  Weight: 252 lb 6.4 oz (114.5 kg)  Height: 5' 8 (1.727 m)  PainSc: 2   PainLoc: Leg   Body mass index is 38.38 kg/m.     03/15/2024    3:26 PM 05/12/2023    3:13 PM 03/10/2023   10:46 PM 01/14/2023    2:20 PM 01/21/2022    3:51 PM 01/31/2021    5:00 PM 12/28/2018   10:08 AM  Advanced Directives  Does Patient Have a Medical Advance Directive? Yes No Yes Yes Yes Yes Yes  Type of Estate agent of [place];Living will  Healthcare Power of [place];Living will Living will;Healthcare Power of [employer] Power of Attorney    Does patient want to make changes to medical advance directive? No - Patient declined  No - Patient declined  No - Patient declined No - Patient declined   Copy of Healthcare Power of Attorney in Chart? Yes - validated most recent copy scanned in chart (See row information)  Yes - validated most recent copy scanned in chart (See row information)  No - copy requested      Current Medications (verified) Outpatient Encounter Medications as of 03/15/2024  Medication Sig   AMBULATORY NON FORMULARY MEDICATION  Medication Name: Allergy  Injection- every 4 weeks  I take it once a week 01/14/23   aspirin 81 MG tablet Take 81 mg by mouth daily.   Azelastine  HCl 137 MCG/SPRAY SOLN PLACE 1-2 SPRAYS IN EACH NOSTRIL ONCE DAILY.   B-D ULTRA-FINE 33 LANCETS MISC Use to test blood sugar once daily. Dx: E11.9   budesonide -formoterol  (SYMBICORT ) 160-4.5 MCG/ACT inhaler TAKE 2 PUFFS BY MOUTH TWICE A DAY   Calcium  Carbonate-Vitamin D  600-400 MG-UNIT chew tablet Chew 1 tablet by mouth daily.    EPINEPHrine  0.3 mg/0.3 mL IJ SOAJ injection Inject 0.3 mg into the muscle as needed for anaphylaxis.   fluticasone  (FLONASE ) 50 MCG/ACT nasal spray PLACE 1-2 SPRAYS INTO BOTH NOSTRILS DAILY.   gabapentin  (NEURONTIN ) 300 MG capsule TAKE 1 CAPSULE BY MOUTH EVERYDAY AT BEDTIME   levocetirizine (XYZAL ) 5 MG tablet TAKE 1 TABLET BY MOUTH EVERY DAY IN THE EVENING   lisinopril  (ZESTRIL ) 10 MG tablet TAKE 1 TABLET BY MOUTH EVERY DAY   metroNIDAZOLE  (FLAGYL ) 500 MG tablet Take 1 tablet (500 mg total) by mouth 2 (two) times daily.   Multiple Vitamin (MULTIVITAMIN ADULT PO)    omeprazole  (PRILOSEC) 40 MG capsule TAKE 1 CAPSULE (40 MG TOTAL) BY MOUTH DAILY.   Respiratory Therapy Supplies (CARETOUCH 2 CPAP HOSE HANGER) MISC    rosuvastatin  (CRESTOR ) 20 MG tablet TAKE 1 TABLET BY MOUTH EVERY DAY   Semaglutide , 1 MG/DOSE, (OZEMPIC , 1 MG/DOSE,) 4 MG/3ML  SOPN INJECT 1 MG SUBCUTANEOUSLY ONCE A WEEK AS DIRECTED   Spacer/Aero-Holding Chambers DEVI 1 each by Does not apply route as needed.   triamterene -hydrochlorothiazide (MAXZIDE) 75-50 MG tablet TAKE 1 TABLET BY MOUTH EVERY DAY   vitamin C (ASCORBIC ACID) 500 MG tablet Take 500 mg by mouth every other day.   No facility-administered encounter medications on file as of 03/15/2024.    Allergies (verified) Misc. sulfonamide containing compounds, Elemental sulfur, and Oxycodone   History: Past Medical History:  Diagnosis Date   Allergic rhinitis, cause unspecified    Arthritis     Asthma    Carpal tunnel syndrome    Cataract    Diabetes mellitus without complication (HCC)    diet- controlled   GERD (gastroesophageal reflux disease)    Glaucoma    Hypertension    Impaired fasting glucose    Morbid obesity (HCC)    OSA (obstructive sleep apnea) 12/02/2016   Other malaise and fatigue    Other specified cardiac dysrhythmias(427.89)    Pain in joint, site unspecified    Symptomatic menopausal or female climacteric states    Syncope and collapse    Past Surgical History:  Procedure Laterality Date   ABDOMINAL HYSTERECTOMY     1994   carpal tunnel both hands     2012   CLOSED MANIPULATION SHOULDER Left 04/2014   EYE SURGERY Left 08/25/2015   EYE SURGERY Right 09/24/2015   JOINT REPLACEMENT     both knees replacement, 2010   mole removed from face     Nodule removed from back     SHOULDER SURGERY Left 11/2013   TONSILLECTOMY     as teenager   Family History  Problem Relation Age of Onset   Cancer Father    Diabetes Father    Diabetes Paternal Aunt    Lung cancer Cousin    Diabetes Brother    Allergic rhinitis Son    Allergic rhinitis Son    Asthma Neg Hx    Breast cancer Neg Hx    Social History   Socioeconomic History   Marital status: Divorced    Spouse name: Not on file   Number of children: Not on file   Years of education: Not on file   Highest education level: Not on file  Occupational History   Not on file  Tobacco Use   Smoking status: Never   Smokeless tobacco: Never  Vaping Use   Vaping status: Never Used  Substance and Sexual Activity   Alcohol use: Yes    Comment: Seldom- Wine    Drug use: No   Sexual activity: Not Currently    Comment: post office worker  Other Topics Concern   Not on file  Social History Narrative   Not on file   Social Drivers of Health   Financial Resource Strain: Low Risk  (03/15/2024)   Overall Financial Resource Strain (CARDIA)    Difficulty of Paying Living Expenses: Not hard at all  Food  Insecurity: No Food Insecurity (03/15/2024)   Hunger Vital Sign    Worried About Running Out of Food in the Last Year: Never true    Ran Out of Food in the Last Year: Never true  Transportation Needs: No Transportation Needs (03/15/2024)   PRAPARE - Administrator, Civil Service (Medical): No    Lack of Transportation (Non-Medical): No  Physical Activity: Sufficiently Active (03/15/2024)   Exercise Vital Sign    Days of Exercise per Week:  5 days    Minutes of Exercise per Session: 30 min  Stress: No Stress Concern Present (03/15/2024)   [employer] of Occupational Health - Occupational Stress Questionnaire    Feeling of Stress: Not at all  Social Connections: Socially Isolated (03/15/2024)   Social Connection and Isolation Panel    Frequency of Communication with Friends and Family: More than three times a week    Frequency of Social Gatherings with Friends and Family: Never    Attends Religious Services: Never    Database administrator or Organizations: No    Attends Engineer, structural: Never    Marital Status: Never married    Tobacco Counseling Counseling given: Not Answered    Clinical Intake:  Pre-visit preparation completed: Yes  Pain : No/denies pain Pain Score: 2      BMI - recorded: 38.38 Nutritional Status: BMI > 30  Obese Nutritional Risks: None Diabetes: No  Lab Results  Component Value Date   HGBA1C 5.7 (A) 02/02/2024   HGBA1C 5.9 09/10/2023   HGBA1C 6.2 06/04/2023     How often do you need to have someone help you when you read instructions, pamphlets, or other written materials from your doctor or pharmacy?: 1 - Never What is the last grade level you completed in school?: 14 YEARS  Interpreter Needed?: No  Information entered by :: Crysta Gulick N. Cailah Reach, LPN.   Activities of Daily Living     03/15/2024    3:26 PM 03/14/2024    8:17 PM  In your present state of health, do you have any difficulty performing the  following activities:  Hearing? 0 0  Vision? 0 0  Difficulty concentrating or making decisions? 0 0  Walking or climbing stairs? 0 0  Dressing or bathing? 0 0  Doing errands, shopping? 0 0  Preparing Food and eating ? N N  Using the Toilet? N N  In the past six months, have you accidently leaked urine? Y Y  Do you have problems with loss of bowel control? N N  Managing your Medications? N N  Managing your Finances? N N  Housekeeping or managing your Housekeeping? N N    Patient Care Team: Vicci Barnie NOVAK, MD as PCP - General (Internal Medicine) Dann Candyce RAMAN, MD as PCP - Cardiology (Cardiology) Para Shaver, [place] (Optometry) Ding, Jiaxi, MD as Consulting Physician (Ophthalmology) Iva Marty Saltness, MD as Consulting Physician (Allergy  and Immunology) Ob/Gyn, [facility] (Obstetrics and Gynecology) Armond Cape, MD as Consulting Physician (Obstetrics and Gynecology) Neysa Reggy BIRCH, MD as Consulting Physician (Pulmonary Disease) Dunn, Peter K as Consulting Physician (Optometry)  I have updated your Care Teams any recent Medical Services you may have received from other providers in the past year.     Assessment:   This is a routine wellness examination for Cheryl [name].  Hearing/Vision screen Hearing Screening - Comments:: Patient has adequate hearing. Vision Screening - Comments:: Patient has adequate vision, wears eyeglasses.   Goals Addressed             This Visit's Progress    03/15/2024: Stay independent, active and  socially involved.         Depression Screen     03/15/2024    3:39 PM 02/02/2024    2:18 PM 10/02/2023    2:37 PM 03/10/2023   10:42 PM 01/01/2023    9:22 AM 10/03/2022    2:54 PM 05/30/2022    2:03 PM  PHQ 2/9 Scores  PHQ -  2 Score 0 4 1 2 1 3 4   PHQ- 9 Score 0 13 7 6 6 7 9     Fall Risk     03/15/2024    3:24 PM 03/14/2024    8:17 PM 10/02/2023    2:35 PM 03/10/2023   10:45 PM 01/01/2023    9:22 AM  Fall Risk   Falls in the  past year? 0 0 0 0 0  Number falls in past yr: 0 0 0 0 0  Injury with Fall? 0 0 0 0 0  Risk for fall due to : No Fall Risks  No Fall Risks No Fall Risks No Fall Risks  Follow up Falls evaluation completed  Falls evaluation completed Falls prevention discussed;Education provided;Falls evaluation completed     MEDICARE RISK AT HOME:  Medicare Risk at Home Any stairs in or around the home?: Yes If so, are there any without handrails?: No Home free of loose throw rugs in walkways, pet beds, electrical cords, etc?: Yes Adequate lighting in your home to reduce risk of falls?: Yes Life alert?: No Use of a cane, walker or w/c?: Yes Grab bars in the bathroom?: No Shower chair or bench in shower?: Yes Elevated toilet seat or a handicapped toilet?: No  TIMED UP AND GO:  Was the test performed?  No  Cognitive Function: 6CIT completed    03/15/2024    3:41 PM 01/21/2022    3:56 PM  MMSE - Mini Mental State Exam  Orientation to time 5 5  Orientation to Place 5 5  Registration 3 3  Attention/ Calculation 5 5  Recall 3 2  Language- name 2 objects 2 2  Language- repeat 1 1  Language- follow 3 step command 3 3  Language- read & follow direction 1 1  Write a sentence 1 1  Copy design 1 1  Total score 30 29        03/15/2024    3:51 PM 03/10/2023   10:45 PM 11/17/2018    1:20 PM  6CIT Screen  What Year? 0 points 0 points 0 points  What month? 0 points 0 points 0 points  What time? 0 points 0 points 0 points  Count back from 20 0 points 0 points 0 points  Months in reverse 0 points 0 points 0 points  Repeat phrase 0 points 0 points 2 points  Total Score 0 points 0 points 2 points    Immunizations Immunization History  Administered Date(s) Administered   INFLUENZA, HIGH DOSE SEASONAL PF 02/16/2024   Influenza Split 02/07/2017   Influenza,inj,Quad PF,6+ Mos 02/08/2013, 02/23/2015, 02/29/2016, 01/25/2021, 01/28/2022   Influenza-Unspecified 01/24/2014, 02/12/2018, 02/07/2019,  01/23/2023   PFIZER(Purple Top)SARS-COV-2 Vaccination 07/02/2019, 07/26/2019, 02/24/2020, 09/28/2020, 09/23/2021   Pfizer(Comirnaty)Fall Seasonal Vaccine 12 years and older 01/23/2023   Pneumococcal Conjugate (Pcv15) 05/28/2021   Pneumococcal Polysaccharide-23 05/03/2013, 09/13/2020   Rsv, Mab, [place], 1 Ml, Neonate To 24 Mos(Beyfortus) 01/29/2022   Td 08/04/2011   Tdap 09/25/2021   Zoster Recombinant(Shingrix) 12/21/2016, 06/30/2017   Zoster, Live 05/26/2014, 02/23/2017    Screening Tests Health Maintenance  Topic Date Due   COVID-19 Vaccine (7 - 2025-26 season) 01/25/2024   OPHTHALMOLOGY EXAM  06/01/2024   HEMOGLOBIN A1C  08/01/2024   Diabetic kidney evaluation - eGFR measurement  02/01/2025   Diabetic kidney evaluation - Urine ACR  02/01/2025   FOOT EXAM  02/01/2025   Medicare Annual Wellness (AWV)  03/15/2025   Mammogram  02/09/2026   Colonoscopy  06/26/2030  DTaP/Tdap/Td (3 - Td or Tdap) 09/26/2031   Pneumococcal Vaccine: 50+ Years  Completed   Influenza Vaccine  Completed   DEXA SCAN  Completed   Zoster Vaccines- Shingrix  Completed   Meningococcal B Vaccine  Aged Out   Hepatitis C Screening  Discontinued    Health Maintenance Items Addressed: Vaccines Due: Covid-19  Additional Screening:  Vision Screening: Recommended annual ophthalmology exams for early detection of glaucoma and other disorders of the eye. Is the patient up to date with their annual eye exam?  Yes  Who is the provider or what is the name of the office in which the patient attends annual eye exams? Maude Bring, OD.  Dental Screening: Recommended annual dental exams for proper oral hygiene  Community Resource Referral / Chronic Care Management: CRR required this visit?  No   CCM required this visit?  No   Plan:    I have personally reviewed and noted the following in the patient's chart:   Medical and social history Use of alcohol, tobacco or illicit drugs  Current medications  and supplements including opioid prescriptions. Patient is not currently taking opioid prescriptions. Functional ability and status Nutritional status Physical activity Advanced directives List of other physicians Hospitalizations, surgeries, and ER visits in previous 12 months Vitals Screenings to include cognitive, depression, and falls Referrals and appointments  In addition, I have reviewed and discussed with patient certain preventive protocols, quality metrics, and best practice recommendations. A written personalized care plan for preventive services as well as general preventive health recommendations were provided to patient.   Cheryl LOISE Fuller, LPN   [id number]   After Visit Summary: (In Person-Printed) AVS printed and given to the patient  Notes: Nothing significant to report at this time.

## 2024-03-15 NOTE — Progress Notes (Deleted)
 Subjective:   Cheryl [name] is a 68 y.o. who presents for a Medicare Wellness preventive visit.  As a reminder, Annual Wellness Visits don't include a physical exam, and some assessments may be limited, especially if this visit is performed virtually. We may recommend an in-person follow-up visit with your provider if needed.  Visit Complete: In person  VideoDeclined- This patient declined Interactive audio and Acupuncturist. Therefore the visit was completed with audio only.  Persons Participating in Visit: Patient.  AWV Questionnaire: Yes: Patient Medicare AWV questionnaire was completed by the patient on 03/14/2024; I have confirmed that all information answered by patient is correct and no changes since this date.        Objective:    There were no vitals filed for this visit. There is no height or weight on file to calculate BMI.     05/12/2023    3:13 PM 03/10/2023   10:46 PM 01/14/2023    2:20 PM 01/21/2022    3:51 PM 01/31/2021    5:00 PM 12/28/2018   10:08 AM 11/17/2018    1:19 PM  Advanced Directives  Does Patient Have a Medical Advance Directive? No Yes Yes Yes Yes Yes Yes  Type of Furniture conservator/restorer;Living will Living will;Healthcare Power of [employer] Power of [employer] Power of [place];Living will  Does patient want to make changes to medical advance directive?  No - Patient declined  No - Patient declined No - Patient declined  No - Patient declined   Copy of Healthcare Power of Attorney in Chart?  Yes - validated most recent copy scanned in chart (See row information)  No - copy requested   Yes - validated most recent copy scanned in chart (See row information)      Data saved with a previous flowsheet row definition    Current Medications (verified) Outpatient Encounter Medications as of 03/15/2024  Medication Sig   AMBULATORY NON FORMULARY MEDICATION Medication Name: Allergy  Injection- every 4  weeks  I take it once a week 01/14/23   aspirin 81 MG tablet Take 81 mg by mouth daily.   Azelastine  HCl 137 MCG/SPRAY SOLN PLACE 1-2 SPRAYS IN EACH NOSTRIL ONCE DAILY.   B-D ULTRA-FINE 33 LANCETS MISC Use to test blood sugar once daily. Dx: E11.9   budesonide -formoterol  (SYMBICORT ) 160-4.5 MCG/ACT inhaler TAKE 2 PUFFS BY MOUTH TWICE A DAY   Calcium  Carbonate-Vitamin D  600-400 MG-UNIT chew tablet Chew 1 tablet by mouth daily.    EPINEPHrine  0.3 mg/0.3 mL IJ SOAJ injection Inject 0.3 mg into the muscle as needed for anaphylaxis.   fluticasone  (FLONASE ) 50 MCG/ACT nasal spray PLACE 1-2 SPRAYS INTO BOTH NOSTRILS DAILY.   gabapentin  (NEURONTIN ) 300 MG capsule TAKE 1 CAPSULE BY MOUTH EVERYDAY AT BEDTIME   levocetirizine (XYZAL ) 5 MG tablet TAKE 1 TABLET BY MOUTH EVERY DAY IN THE EVENING   lisinopril  (ZESTRIL ) 10 MG tablet TAKE 1 TABLET BY MOUTH EVERY DAY   metroNIDAZOLE  (FLAGYL ) 500 MG tablet Take 1 tablet (500 mg total) by mouth 2 (two) times daily.   Multiple Vitamin (MULTIVITAMIN ADULT PO)    omeprazole  (PRILOSEC) 40 MG capsule TAKE 1 CAPSULE (40 MG TOTAL) BY MOUTH DAILY.   Respiratory Therapy Supplies (CARETOUCH 2 CPAP HOSE HANGER) MISC    rosuvastatin  (CRESTOR ) 20 MG tablet TAKE 1 TABLET BY MOUTH EVERY DAY   Semaglutide , 1 MG/DOSE, (OZEMPIC , 1 MG/DOSE,) 4 MG/3ML SOPN INJECT 1 MG SUBCUTANEOUSLY ONCE A WEEK AS  DIRECTED   Spacer/Aero-Holding Raguel DEVI 1 each by Does not apply route as needed.   triamterene -hydrochlorothiazide (MAXZIDE) 75-50 MG tablet TAKE 1 TABLET BY MOUTH EVERY DAY   vitamin C (ASCORBIC ACID) 500 MG tablet Take 500 mg by mouth every other day.   No facility-administered encounter medications on file as of 03/15/2024.    Allergies (verified) Misc. sulfonamide containing compounds, Elemental sulfur, and Oxycodone   History: Past Medical History:  Diagnosis Date   Allergic rhinitis, cause unspecified    Arthritis    Asthma    Carpal tunnel syndrome    Cataract     Diabetes mellitus without complication (HCC)    diet- controlled   GERD (gastroesophageal reflux disease)    Glaucoma    Hypertension    Impaired fasting glucose    Morbid obesity (HCC)    OSA (obstructive sleep apnea) 12/02/2016   Other malaise and fatigue    Other specified cardiac dysrhythmias(427.89)    Pain in joint, site unspecified    Symptomatic menopausal or female climacteric states    Syncope and collapse    Past Surgical History:  Procedure Laterality Date   ABDOMINAL HYSTERECTOMY     1994   carpal tunnel both hands     2012   CLOSED MANIPULATION SHOULDER Left 04/2014   EYE SURGERY Left 08/25/2015   EYE SURGERY Right 09/24/2015   JOINT REPLACEMENT     both knees replacement, 2010   mole removed from face     Nodule removed from back     SHOULDER SURGERY Left 11/2013   TONSILLECTOMY     as teenager   Family History  Problem Relation Age of Onset   Cancer Father    Diabetes Father    Diabetes Paternal Aunt    Lung cancer Cousin    Diabetes Brother    Allergic rhinitis Son    Allergic rhinitis Son    Asthma Neg Hx    Breast cancer Neg Hx    Social History   Socioeconomic History   Marital status: Divorced    Spouse name: Not on file   Number of children: Not on file   Years of education: Not on file   Highest education level: Not on file  Occupational History   Not on file  Tobacco Use   Smoking status: Never   Smokeless tobacco: Never  Vaping Use   Vaping status: Never Used  Substance and Sexual Activity   Alcohol use: Yes    Comment: Seldom- Wine    Drug use: No   Sexual activity: Not Currently    Comment: post office worker  Other Topics Concern   Not on file  Social History Narrative   Not on file   Social Drivers of Health   Financial Resource Strain: Low Risk  (03/10/2023)   Overall Financial Resource Strain (CARDIA)    Difficulty of Paying Living Expenses: Not hard at all  Food Insecurity: No Food Insecurity (03/10/2023)    Hunger Vital Sign    Worried About Running Out of Food in the Last Year: Never true    Ran Out of Food in the Last Year: Never true  Transportation Needs: No Transportation Needs (03/10/2023)   PRAPARE - Administrator, Civil Service (Medical): No    Lack of Transportation (Non-Medical): No  Physical Activity: Insufficiently Active (03/10/2023)   Exercise Vital Sign    Days of Exercise per Week: 3 days    Minutes of Exercise per  Session: 30 min  Stress: No Stress Concern Present (03/10/2023)   [employer] of Occupational Health - Occupational Stress Questionnaire    Feeling of Stress : Only a little  Social Connections: Socially Isolated (03/10/2023)   Social Connection and Isolation Panel    Frequency of Communication with Friends and Family: More than three times a week    Frequency of Social Gatherings with Friends and Family: Never    Attends Religious Services: Never    Database administrator or Organizations: No    Attends Banker Meetings: Never    Marital Status: Never married    Tobacco Counseling Counseling given: Not Answered    Clinical Intake:              Lab Results  Component Value Date   HGBA1C 5.7 (A) 02/02/2024   HGBA1C 5.9 09/10/2023   HGBA1C 6.2 06/04/2023               Activities of Daily Living ***    03/14/2024    8:17 PM  In your present state of health, do you have any difficulty performing the following activities:  Hearing? 0  Vision? 0  Difficulty concentrating or making decisions? 0  Walking or climbing stairs? 0  Dressing or bathing? 0  Doing errands, shopping? 0  Preparing Food and eating ? N  Using the Toilet? N  In the past six months, have you accidently leaked urine? Y  Do you have problems with loss of bowel control? N  Managing your Medications? N  Managing your Finances? N  Housekeeping or managing your Housekeeping? N    Patient Care Team: Vicci Barnie NOVAK, MD as PCP -  General (Internal Medicine) Dann Candyce RAMAN, MD as PCP - Cardiology (Cardiology) Para Shaver, [place] (Optometry) Ding, Jiaxi, MD as Consulting Physician (Ophthalmology) Iva Marty Saltness, MD as Consulting Physician (Allergy  and Immunology) Ob/Gyn, [facility] (Obstetrics and Gynecology) Armond Cape, MD as Consulting Physician (Obstetrics and Gynecology) Neysa Reggy BIRCH, MD as Consulting Physician (Pulmonary Disease) *** I have updated your Care Teams any recent Medical Services you may have received from other providers in the past year.     Assessment:   This is a routine wellness examination for Cheryl [name].  Hearing/Vision screen No results found.   Goals Addressed   None    Depression Screen ***    02/02/2024    2:18 PM 10/02/2023    2:37 PM 03/10/2023   10:42 PM 01/01/2023    9:22 AM 10/03/2022    2:54 PM 05/30/2022    2:03 PM 01/28/2022    3:24 PM  PHQ 2/9 Scores  PHQ - 2 Score 4 1 2 1 3 4 2   PHQ- 9 Score 13 7 6 6 7 9 6     Fall Risk ***    03/14/2024    8:17 PM 10/02/2023    2:35 PM 03/10/2023   10:45 PM 01/01/2023    9:22 AM 10/03/2022    2:19 PM  Fall Risk   Falls in the past year? 0 0 0 0 0  Number falls in past yr: 0 0 0 0 0  Injury with Fall? 0 0 0 0 0  Risk for fall due to :  No Fall Risks No Fall Risks No Fall Risks No Fall Risks  Follow up  Falls evaluation completed Falls prevention discussed;Education provided;Falls evaluation completed      MEDICARE RISK AT HOME: *** Medicare Risk at Home Any stairs in  or around the home?: (Patient-Rptd) Yes If so, are there any without handrails?: (Patient-Rptd) No Home free of loose throw rugs in walkways, pet beds, electrical cords, etc?: (Patient-Rptd) Yes Adequate lighting in your home to reduce risk of falls?: (Patient-Rptd) Yes Life alert?: (Patient-Rptd) No Use of a cane, walker or w/c?: (Patient-Rptd) Yes Grab bars in the bathroom?: (Patient-Rptd) No Shower chair or bench in shower?: (Patient-Rptd)  Yes Elevated toilet seat or a handicapped toilet?: (Patient-Rptd) No  TIMED UP AND GO:  Was the test performed?  {AMBTIMEDUPGO:[id number]}  Cognitive Function: {CognitiveScreening:32337}    01/21/2022    3:56 PM  MMSE - Mini Mental State Exam  Orientation to time 5  Orientation to Place 5  Registration 3  Attention/ Calculation 5  Recall 2  Language- name 2 objects 2  Language- repeat 1  Language- follow 3 step command 3  Language- read & follow direction 1  Write a sentence 1  Copy design 1  Total score 29        03/10/2023   10:45 PM 11/17/2018    1:20 PM  6CIT Screen  What Year? 0 points 0 points  What month? 0 points 0 points  What time? 0 points 0 points  Count back from 20 0 points 0 points  Months in reverse 0 points 0 points  Repeat phrase 0 points 2 points  Total Score 0 points 2 points    Immunizations Immunization History  Administered Date(s) Administered   INFLUENZA, HIGH DOSE SEASONAL PF 02/16/2024   Influenza Split 02/07/2017   Influenza,inj,Quad PF,6+ Mos 02/08/2013, 02/23/2015, 02/29/2016, 01/25/2021, 01/28/2022   Influenza-Unspecified 01/24/2014, 02/12/2018, 02/07/2019, 01/23/2023   PFIZER(Purple Top)SARS-COV-2 Vaccination 07/02/2019, 07/26/2019, 02/24/2020, 09/28/2020, 09/23/2021   Pfizer(Comirnaty)Fall Seasonal Vaccine 12 years and older 01/23/2023   Pneumococcal Conjugate (Pcv15) 05/28/2021   Pneumococcal Polysaccharide-23 05/03/2013, 09/13/2020   Rsv, Mab, [place], 1 Ml, Neonate To 24 Mos(Beyfortus) 01/29/2022   Td 08/04/2011   Tdap 09/25/2021   Zoster Recombinant(Shingrix) 12/21/2016, 06/30/2017   Zoster, Live 05/26/2014, 02/23/2017    Screening Tests Health Maintenance  Topic Date Due   COVID-19 Vaccine (7 - 2025-26 season) 01/25/2024   Medicare Annual Wellness (AWV)  03/09/2024   OPHTHALMOLOGY EXAM  06/01/2024   HEMOGLOBIN A1C  08/01/2024   Diabetic kidney evaluation - eGFR measurement  02/01/2025   Diabetic kidney  evaluation - Urine ACR  02/01/2025   FOOT EXAM  02/01/2025   Mammogram  02/09/2026   Colonoscopy  06/26/2030   DTaP/Tdap/Td (3 - Td or Tdap) 09/26/2031   Pneumococcal Vaccine: 50+ Years  Completed   Influenza Vaccine  Completed   DEXA SCAN  Completed   Zoster Vaccines- Shingrix  Completed   Meningococcal B Vaccine  Aged Out   Hepatitis C Screening  Discontinued    Health Maintenance Items Addressed: {HMMCR (Optional):30011}  Additional Screening:  Vision Screening: Recommended annual ophthalmology exams for early detection of glaucoma and other disorders of the eye. Is the patient up to date with their annual eye exam?  {YES/NO:21197} Who is the provider or what is the name of the office in which the patient attends annual eye exams? ***  Dental Screening: Recommended annual dental exams for proper oral hygiene  Community Resource Referral / Chronic Care Management: CRR required this visit?  {YES/NO:21197}  CCM required this visit?  {CCM Required choices:[phone]}   Plan:    I have personally reviewed and noted the following in the patient's chart:   Medical and social history Use of alcohol, tobacco  or illicit drugs  Current medications and supplements including opioid prescriptions. {Opioid Prescriptions:[phone]} Functional ability and status Nutritional status Physical activity Advanced directives List of other physicians Hospitalizations, surgeries, and ER visits in previous 12 months Vitals Screenings to include cognitive, depression, and falls Referrals and appointments  In addition, I have reviewed and discussed with patient certain preventive protocols, quality metrics, and best practice recommendations. A written personalized care plan for preventive services as well as general preventive health recommendations were provided to patient.   Cheryl LOISE Fuller, LPN   [id number]   After Visit Summary: {CHL AMB AWV After Visit  Summary:[phone]}  Notes: {Nurse Notes:32343}

## 2024-03-15 NOTE — Patient Instructions (Signed)
 [name],  Thank you for taking the time for your Medicare Wellness Visit. I appreciate your continued commitment to your health goals. Please review the care plan we discussed, and feel free to reach out if I can assist you further.  Medicare recommends these wellness visits once per year to help you and your care team stay ahead of potential health issues. These visits are designed to focus on prevention, allowing your provider to concentrate on managing your acute and chronic conditions during your regular appointments.  Please note that Annual Wellness Visits do not include a physical exam. Some assessments may be limited, especially if the visit was conducted virtually. If needed, we may recommend a separate in-person follow-up with your provider.  Ongoing Care Seeing your primary care provider every 3 to 6 months helps us  monitor your health and provide consistent, personalized care.   Referrals If a referral was made during today's visit and you haven't received any updates within two weeks, please contact the referred provider directly to check on the status.  Recommended Screenings:  Health Maintenance  Topic Date Due   COVID-19 Vaccine (7 - 2025-26 season) 01/25/2024   Medicare Annual Wellness Visit  03/09/2024   Eye exam for diabetics  06/01/2024   Hemoglobin A1C  08/01/2024   Yearly kidney function blood test for diabetes  02/01/2025   Yearly kidney health urinalysis for diabetes  02/01/2025   Complete foot exam   02/01/2025   Breast Cancer Screening  02/09/2026   Colon Cancer Screening  06/26/2030   DTaP/Tdap/Td vaccine (3 - Td or Tdap) 09/26/2031   Pneumococcal Vaccine for age over 81  Completed   Flu Shot  Completed   DEXA scan (bone density measurement)  Completed   Zoster (Shingles) Vaccine  Completed   Meningitis B Vaccine  Aged Out   Hepatitis C Screening  Discontinued       03/15/2024    3:26 PM  Advanced Directives  Does Patient Have a Medical Advance  Directive? Yes  Type of Estate agent of [place];Living will  Does patient want to make changes to medical advance directive? No - Patient declined  Copy of Healthcare Power of Attorney in Chart? Yes - validated most recent copy scanned in chart (See row information)   Advance Care Planning is important because it: Ensures you receive medical care that aligns with your values, goals, and preferences. Provides guidance to your family and loved ones, reducing the emotional burden of decision-making during critical moments.  Vision: Annual vision screenings are recommended for early detection of glaucoma, cataracts, and diabetic retinopathy. These exams can also reveal signs of chronic conditions such as diabetes and high blood pressure.  Dental: Annual dental screenings help detect early signs of oral cancer, gum disease, and other conditions linked to overall health, including heart disease and diabetes.  Please see the attached documents for additional preventive care recommendations.

## 2024-03-28 DIAGNOSIS — J302 Other seasonal allergic rhinitis: Secondary | ICD-10-CM | POA: Diagnosis not present

## 2024-03-28 NOTE — Progress Notes (Signed)
 VIALS MADE ON 03/28/24

## 2024-03-29 DIAGNOSIS — J301 Allergic rhinitis due to pollen: Secondary | ICD-10-CM | POA: Diagnosis not present

## 2024-03-29 DIAGNOSIS — J3089 Other allergic rhinitis: Secondary | ICD-10-CM | POA: Diagnosis not present

## 2024-03-30 ENCOUNTER — Ambulatory Visit: Payer: Self-pay

## 2024-03-30 ENCOUNTER — Ambulatory Visit: Attending: Nurse Practitioner | Admitting: Nurse Practitioner

## 2024-03-30 ENCOUNTER — Encounter: Payer: Self-pay | Admitting: Nurse Practitioner

## 2024-03-30 VITALS — BP 116/74 | HR 64 | Resp 19 | Ht 68.0 in | Wt 246.0 lb

## 2024-03-30 DIAGNOSIS — I499 Cardiac arrhythmia, unspecified: Secondary | ICD-10-CM | POA: Insufficient documentation

## 2024-03-30 DIAGNOSIS — R55 Syncope and collapse: Secondary | ICD-10-CM | POA: Insufficient documentation

## 2024-03-30 DIAGNOSIS — Z79899 Other long term (current) drug therapy: Secondary | ICD-10-CM | POA: Insufficient documentation

## 2024-03-30 DIAGNOSIS — I10 Essential (primary) hypertension: Secondary | ICD-10-CM | POA: Insufficient documentation

## 2024-03-30 NOTE — Progress Notes (Signed)
 Assessment & Plan:  Cheryl [name] was seen today for hypertension and loss of consciousness.  Diagnoses and all orders for this visit:  Syncope and collapse -     Ambulatory referral to Cardiology Recurrent syncope with possible AFib indicated by  her smart watch. She is noted for RRR today on exam.  Differential includes cardiac arrhythmia, possibly related to hypokalemia. - Refer to cardiology for evaluation and possible echocardiogram. - Consider Holter monitor for intermittent arrhythmia. - Provide cardiology contact information for follow-up.  Irregular cardiac rhythm -     Ambulatory referral to Cardiology  Primary hypertension Blood pressure well-controlled on lisinopril  and triamterene  hydrochlorothiazide. Discussed medication timing and potassium management. - Advise lisinopril  in the evening, triamterene  hydrochlorothiazide in the morning. - Provide list of potassium-rich foods. - Encourage daily consumption of half a banana in am and half in pm   Patient has been counseled on age-appropriate routine health concerns for screening and prevention. These are reviewed and up-to-date. Referrals have been placed accordingly. Immunizations are up-to-date or declined.    Subjective:   Chief Complaint  Patient presents with   Hypertension   Loss of Consciousness    Feels flutters on right side of chest.    Cheryl [name] 68 y.o. female presents to office today for syncope and collapse. She is a patient of Dr. Vicci   She experienced a recent episode of syncope approximately four to five days ago while preparing breakfast. She felt nauseous and had an urge to use the bathroom before losing consciousness, regaining consciousness immediately upon hitting the floor. During the recent episode, her watch alerted her to a possible atrial fibrillation event, although she has no known history of atrial fibrillation or diagnosed irregular heart rhythms.  This was the second occurrence, with a  similar episode happening previously in [place]  2 years ago. At that time she saw a cardiologist and was instructed to send her medical records of a possible ECHOCARDIOGRAM that had been completed in [place]. However she was lost to follow up for this.  She also experiences a 'funny feeling' in her chest, which she cannot describe well, located on the right side, accompanied by right arm pain. She has a history of shoulder surgery on both shoulder and also sleeps on her right side. .  She uses a CPAP machine every night.    Blood pressure is well controlled. Her current medications include lisinopril  (suggested to be taken in the evening) and triamterene  hydrochlorothiazide (suggested to be taken in the morning). BP Readings from Last 3 Encounters:  03/30/24 116/74  03/15/24 112/72  02/23/24 110/64        Review of Systems  Constitutional:  Negative for fever, malaise/fatigue and weight loss.  HENT: Negative.  Negative for nosebleeds.   Eyes: Negative.  Negative for blurred vision, double vision and photophobia.  Respiratory: Negative.  Negative for cough and shortness of breath.   Cardiovascular:  Positive for chest pain. Negative for palpitations (right sided) and leg swelling.  Gastrointestinal: Negative.  Negative for heartburn, nausea and vomiting.  Musculoskeletal:  Positive for joint pain. Negative for myalgias.  Neurological: Negative.  Negative for dizziness, focal weakness, seizures and headaches.  Psychiatric/Behavioral: Negative.  Negative for suicidal ideas.     Past Medical History:  Diagnosis Date   Allergic rhinitis, cause unspecified    Arthritis    Asthma    Carpal tunnel syndrome    Cataract    Diabetes mellitus without complication (HCC)    diet-  controlled   GERD (gastroesophageal reflux disease)    Glaucoma    Hypertension    Impaired fasting glucose    Morbid obesity (HCC)    OSA (obstructive sleep apnea) 12/02/2016   Other malaise and fatigue    Other  specified cardiac dysrhythmias(427.89)    Pain in joint, site unspecified    Symptomatic menopausal or female climacteric states    Syncope and collapse     Past Surgical History:  Procedure Laterality Date   ABDOMINAL HYSTERECTOMY     1994   carpal tunnel both hands     2012   CLOSED MANIPULATION SHOULDER Left 04/2014   EYE SURGERY Left 08/25/2015   EYE SURGERY Right 09/24/2015   JOINT REPLACEMENT     both knees replacement, 2010   mole removed from face     Nodule removed from back     SHOULDER SURGERY Left 11/2013   TONSILLECTOMY     as teenager    Family History  Problem Relation Age of Onset   Cancer Father    Diabetes Father    Diabetes Paternal Aunt    Lung cancer Cousin    Diabetes Brother    Allergic rhinitis Son    Allergic rhinitis Son    Asthma Neg Hx    Breast cancer Neg Hx     Social History Reviewed with no changes to be made today.   Outpatient Medications Prior to Visit  Medication Sig Dispense Refill   AMBULATORY NON FORMULARY MEDICATION Medication Name: Allergy  Injection- every 4 weeks  I take it once a week 01/14/23     aspirin 81 MG tablet Take 81 mg by mouth daily.     Azelastine  HCl 137 MCG/SPRAY SOLN PLACE 1-2 SPRAYS IN EACH NOSTRIL ONCE DAILY. 90 mL 1   B-D ULTRA-FINE 33 LANCETS MISC Use to test blood sugar once daily. Dx: E11.9 100 each 6   budesonide -formoterol  (SYMBICORT ) 160-4.5 MCG/ACT inhaler TAKE 2 PUFFS BY MOUTH TWICE A DAY 30.6 each 5   Calcium  Carbonate-Vitamin D  600-400 MG-UNIT chew tablet Chew 1 tablet by mouth daily.      EPINEPHrine  0.3 mg/0.3 mL IJ SOAJ injection Inject 0.3 mg into the muscle as needed for anaphylaxis. 2 each 1   fluticasone  (FLONASE ) 50 MCG/ACT nasal spray PLACE 1-2 SPRAYS INTO BOTH NOSTRILS DAILY. 48 mL 2   gabapentin  (NEURONTIN ) 300 MG capsule TAKE 1 CAPSULE BY MOUTH EVERYDAY AT BEDTIME 90 capsule 1   levocetirizine (XYZAL ) 5 MG tablet TAKE 1 TABLET BY MOUTH EVERY DAY IN THE EVENING 90 tablet 1    lisinopril  (ZESTRIL ) 10 MG tablet TAKE 1 TABLET BY MOUTH EVERY DAY 90 tablet 1   metroNIDAZOLE  (FLAGYL ) 500 MG tablet Take 1 tablet (500 mg total) by mouth 2 (two) times daily. 14 tablet 0   Multiple Vitamin (MULTIVITAMIN ADULT PO)      omeprazole  (PRILOSEC) 40 MG capsule TAKE 1 CAPSULE (40 MG TOTAL) BY MOUTH DAILY. 90 capsule 1   Respiratory Therapy Supplies (CARETOUCH 2 CPAP HOSE HANGER) MISC      rosuvastatin  (CRESTOR ) 20 MG tablet TAKE 1 TABLET BY MOUTH EVERY DAY 90 tablet 1   Semaglutide , 1 MG/DOSE, (OZEMPIC , 1 MG/DOSE,) 4 MG/3ML SOPN INJECT 1 MG SUBCUTANEOUSLY ONCE A WEEK AS DIRECTED 3 mL 6   Spacer/Aero-Holding Chambers DEVI 1 each by Does not apply route as needed. 1 each 0   triamterene -hydrochlorothiazide (MAXZIDE) 75-50 MG tablet TAKE 1 TABLET BY MOUTH EVERY DAY 90 tablet 1  vitamin C (ASCORBIC ACID) 500 MG tablet Take 500 mg by mouth every other day.     No facility-administered medications prior to visit.    Allergies  Allergen Reactions   Misc. Sulfonamide Containing Compounds Diarrhea   Elemental Sulfur Diarrhea   Oxycodone Nausea Only and Other (See Comments)    Had stomach and headache as side effect from medicine       Objective:    BP 116/74 (BP Location: Left Arm, Cuff Size: Normal)   Pulse 64   Resp 19   Ht 5' 8 (1.727 m)   Wt 246 lb (111.6 kg)   SpO2 99%   BMI 37.40 kg/m  Wt Readings from Last 3 Encounters:  03/30/24 246 lb (111.6 kg)  03/15/24 252 lb 6.4 oz (114.5 kg)  02/23/24 256 lb (116.1 kg)    Physical Exam Vitals and nursing note reviewed.  Constitutional:      Appearance: She is well-developed.  HENT:     Head: Normocephalic and atraumatic.  Cardiovascular:     Rate and Rhythm: Normal rate and regular rhythm.     Heart sounds: Normal heart sounds. No murmur heard.    No friction rub. No gallop.  Pulmonary:     Effort: Pulmonary effort is normal. No tachypnea or respiratory distress.     Breath sounds: Normal breath sounds. No  decreased breath sounds, wheezing, rhonchi or rales.  Chest:     Chest wall: No tenderness.  Musculoskeletal:        General: Normal range of motion.     Cervical back: Normal range of motion.  Skin:    General: Skin is warm and dry.  Neurological:     Mental Status: She is alert and oriented to person, place, and time.     Coordination: Coordination normal.  Psychiatric:        Behavior: Behavior normal. Behavior is cooperative.        Thought Content: Thought content normal.        Judgment: Judgment normal.          Patient has been counseled extensively about nutrition and exercise as well as the importance of adherence with medications and regular follow-up. The patient was given clear instructions to go to ER or return to medical center if symptoms don't improve, worsen or new problems develop. The patient verbalized understanding.   Follow-up: Return if symptoms worsen or fail to improve.   Haze LELON Servant, FNP-BC [facility] [place], [place] [phone]   03/30/2024, 3:21 PM

## 2024-03-30 NOTE — Telephone Encounter (Signed)
 Noted

## 2024-03-30 NOTE — Patient Instructions (Signed)
 Candyce Rory Reek, MD Address: [address],  [place], [place] [address] Phone: [phone]

## 2024-03-30 NOTE — Telephone Encounter (Signed)
 FYI Only or Action Required?: FYI only for provider: appointment scheduled on 11/5.  Patient was last seen in primary care on 02/02/2024 by Vicci Barnie NOVAK, MD.  Called Nurse Triage reporting Hypertension.  Symptoms began several days ago.  Interventions attempted: Prescription medications: Lisinopril .  Symptoms are: gradually worsening.  Triage Disposition: See PCP When Office is Open (Within 3 Days)  Patient/caregiver understands and will follow disposition?: Yes   Copied from CRM #[id number]. Topic: Clinical - Red Word Triage >> Mar 30, 2024  8:51 AM Charlet HERO wrote: Red Word that prompted transfer to Nurse Triage: Patient is stating that she has been having b/p 145/83 before she ate and she is blacking out she has been taking her meds, she is on lisinopril  (ZESTRIL ) 10 MG tablet and triamterene -hydrochlorothiazide (MAXZIDE) 75-50 MG tablet. Dr Vicci Reason for Disposition  Systolic BP >= 160 OR Diastolic >= 100  Answer Assessment - Initial Assessment Questions Patient states she blacked out over the weekend, on Sunday, and she came to. She states she has a history of this. She denies hitting head. She states she got really hot on Sunday before blacking out.   Patient states she is uncertain about her watch but she thinks her watch told her she has a-fib but she has no history of it. Patient will report to office today and will bring findings to office.  1. BLOOD PRESSURE: What is your blood pressure? Did you take at least two measurements 5 minutes apart?     14 5/83 this morning, HR 78 - running in 140s/80s but gets up to 170s Highest BP reading in last few days -  2. ONSET: When did you take your blood pressure?     This morning 3. HOW: How did you take your blood pressure? (e.g., automatic home BP monitor, visiting nurse)     Wrist cuff 4. HISTORY: Do you have a history of high blood pressure?     Yes 5. MEDICINES: Are you taking any medicines for blood  pressure? Have you missed any doses recently?     Lisinopril  6. OTHER SYMPTOMS: Do you have any symptoms? (e.g., blurred vision, chest pain, difficulty breathing, headache, weakness)     Patient states she has a history of blacking out but hasn't blacked out recently, headaches, feeling fluttery on right side of chest, right arm pain  Protocols used: Blood Pressure - High-A-AH

## 2024-04-04 ENCOUNTER — Ambulatory Visit

## 2024-04-04 DIAGNOSIS — J4540 Moderate persistent asthma, uncomplicated: Secondary | ICD-10-CM

## 2024-04-04 DIAGNOSIS — J309 Allergic rhinitis, unspecified: Secondary | ICD-10-CM | POA: Diagnosis not present

## 2024-04-04 MED ORDER — ALBUTEROL SULFATE HFA 108 (90 BASE) MCG/ACT IN AERS: 2.0000 | INHALATION_SPRAY | Freq: Four times a day (QID) | RESPIRATORY_TRACT | 1 refills | Status: AC | PRN

## 2024-04-08 ENCOUNTER — Other Ambulatory Visit: Payer: Self-pay | Admitting: Allergy & Immunology

## 2024-04-12 NOTE — Progress Notes (Unsigned)
 Cardiology Office Note:  .   Date:  04/13/2024  ID:  Cheryl [name], DOB [birth year], MRN [id number] PCP: Vicci Barnie NOVAK, MD  [facility] Providers Cardiologist:  Candyce Reek, MD    History of Present Illness: .    Chief Complaint  Patient presents with   Loss of Consciousness    Cheryl [name] is a 68 y.o. female with history of HTN who presents for the evaluation of syncope at the request of Vicci Barnie NOVAK, MD.  History of Present Illness   Cheryl [name] is a 69 year old female with hypertension and hyperlipidemia who presents with syncope.  She experienced a syncopal episode on November 2nd, characterized by a sudden blackout while at home preparing breakfast. Prior to losing consciousness, she felt nauseated and had an urge to defecate. She did not experience chest pain or dyspnea and did not hit her head during the fall. She regained consciousness quickly after hitting the floor and experienced bowel incontinence during the episode. She was alone at the time and has not had any further episodes since then.  Her past medical history includes hypertension, hyperlipidemia, and prediabetes. She is currently on blood pressure medication and gabapentin  for pain management. She has undergone two knee replacements, two shoulder surgeries, and carpal tunnel surgeries. She also uses a CPAP machine. She has lost over 60 pounds, previously weighing over 300 pounds. Her current medications include blood pressure medication, gabapentin , aspirin, and Ozempic . She reports that her blood pressure readings at home have been inconsistent, possibly due to an old machine.  No recent fevers, chills, or illness. She does not smoke and consumes alcohol occasionally. She is retired from the [employer] and lives alone, with two grown children residing in [place] and [place]. Her family history includes heart trouble in an aunt, but not in her parents.          Problem List HTN HLD -T  chol 142, HDL 54, LDL 67, TG 121    ROS: All other ROS reviewed and negative. Pertinent positives noted in the HPI.     Studies Reviewed: SABRA   EKG Interpretation Date/Time:  Wednesday April 13 2024 14:11:33 EST Ventricular Rate:  62 PR Interval:  146 QRS Duration:  76 QT Interval:  428 QTC Calculation: 434 R Axis:   29  Text Interpretation: Normal sinus rhythm Septal infarct , age undetermined Confirmed by Barbaraann Kotyk ([id number]) on 04/13/2024 2:14:55 PM   CT CAC 01/30/2022 IMPRESSION: Coronary calcium  score of 0 Agatston units. This suggests low risk for future cardiac events. Physical Exam:   VS:  BP 110/60   Pulse 62   Ht 5' 8 (1.727 m)   Wt 247 lb (112 kg)   SpO2 97%   BMI 37.56 kg/m    Wt Readings from Last 3 Encounters:  04/13/24 247 lb (112 kg)  03/30/24 246 lb (111.6 kg)  03/15/24 252 lb 6.4 oz (114.5 kg)    GEN: Well nourished, well developed in no acute distress NECK: No JVD; No carotid bruits CARDIAC: RRR, no murmurs, rubs, gallops RESPIRATORY:  Clear to auscultation without rales, wheezing or rhonchi  ABDOMEN: Soft, non-tender, non-distended EXTREMITIES:  No edema; No deformity  ASSESSMENT AND PLAN: .   Assessment and Plan    Syncope likely vasovagal, related to dehydration and antihypertensive medication Syncope likely vasovagal due to dehydration and antihypertensive medication. No further episodes since November 2nd, 2025. Differential includes dehydration and medication side effects. Driving not restricted. -  Ordered echocardiogram to assess cardiac function. - Prescribed 7-day Zio patch for continuous cardiac monitoring. - Discontinued aspirin. - Discontinued lisinopril . - Encouraged daily blood pressure monitoring. - Advised to drink water throughout the day to prevent dehydration. - Suspect this is polypharmacy related given recent weight loss.   Hypertension Managed with multiple medications. Recent weight loss may affect blood pressure  control. Current readings well-controlled, but regimen may need adjustment. - Discontinued lisinopril . - Encouraged daily blood pressure monitoring. - Advised to obtain a new blood pressure monitor.  Obesity (improving) Significant weight loss achieved, likely contributing to improved blood pressure control and reduced medication needs.              Follow-up: Return in about 3 months (around 07/14/2024).  Signed, Darryle DASEN. Barbaraann, MD, [facility]  [facility]  [address] [place], [place] [address] [phone]  2:33 PM

## 2024-04-13 ENCOUNTER — Ambulatory Visit: Attending: Cardiovascular Disease | Admitting: Cardiovascular Disease

## 2024-04-13 ENCOUNTER — Encounter: Payer: Self-pay | Admitting: Cardiovascular Disease

## 2024-04-13 ENCOUNTER — Ambulatory Visit: Attending: Cardiovascular Disease

## 2024-04-13 VITALS — BP 110/60 | HR 62 | Ht 68.0 in | Wt 247.0 lb

## 2024-04-13 DIAGNOSIS — R55 Syncope and collapse: Secondary | ICD-10-CM

## 2024-04-13 NOTE — Patient Instructions (Signed)
 Medication Instructions:  Stop Asprin as directed Stop Lisinopril  as directed  *If you need a refill on your cardiac medications before your next appointment, please call your pharmacy*  Lab Work: NONE ordered at this time of appointment   Testing/Procedures: Your physician has requested that you have an echocardiogram. Echocardiography is a painless test that uses sound waves to create images of your heart. It provides your doctor with information about the size and shape of your heart and how well your heart's chambers and valves are working. This procedure takes approximately one hour. There are no restrictions for this procedure. Please do NOT wear cologne, perfume, aftershave, or lotions (deodorant is allowed). Please arrive 15 minutes prior to your appointment time.  Please note: We ask at that you not bring children with you during ultrasound (echo/ vascular) testing. Due to room size and safety concerns, children are not allowed in the ultrasound rooms during exams. Our front office staff cannot provide observation of children in our lobby area while testing is being conducted. An adult accompanying a patient to their appointment will only be allowed in the ultrasound room at the discretion of the ultrasound technician under special circumstances. We apologize for any inconvenience.  7 day zio monitor    Follow-Up: At [facility], you and your health needs are our priority.  As part of our continuing mission to provide you with exceptional heart care, our providers are all part of one team.  This team includes your primary Cardiologist (physician) and Advanced Practice Providers or APPs (Physician Assistants and Nurse Practitioners) who all work together to provide you with the care you need, when you need it.  Your next appointment:   3-4 month(s)  Provider:   Dr. Barbaraann   We recommend signing up for the patient portal called MyChart.  Sign up information is provided  on this After Visit Summary.  MyChart is used to connect with patients for Virtual Visits (Telemedicine).  Patients are able to view lab/test results, encounter notes, upcoming appointments, etc.  Non-urgent messages can be sent to your provider as well.   To learn more about what you can do with MyChart, go to [email or web address].   Other Instructions  Monitor Blood pressure daily  ZIO XT- Long Term Monitor Instructions  Your physician has requested you wear a ZIO patch monitor for 7 days.  This is a single patch monitor. [employer] supplies one patch monitor per enrollment. Additional stickers are not available. Please do not apply patch if you will be having a Nuclear Stress Test,  Echocardiogram, Cardiac CT, MRI, or Chest Xray during the period you would be wearing the  monitor. The patch cannot be worn during these tests. You cannot remove and re-apply the  ZIO XT patch monitor.  Your ZIO patch monitor will be mailed 3 day USPS to your address on file. It may take 3-5 days  to receive your monitor after you have been enrolled.  Once you have received your monitor, please review the enclosed instructions. Your monitor  has already been registered assigning a specific monitor serial # to you.  Billing and Patient Assistance Program Information  We have supplied [employer] with any of your insurance information on file for billing purposes. [employer] offers a sliding scale Patient Assistance Program for patients that do not have  insurance, or whose insurance does not completely cover the cost of the ZIO monitor.  You must apply for the Patient Assistance Program to qualify for this discounted rate.  To apply, please call [employer] at [phone], select option 4, select option 2, ask to apply for  Patient Assistance Program. Meredeth will ask your household income, and how many people  are in your household. They will quote your out-of-pocket cost based on that information.  [employer] will  also be able to set up a 11-month, interest-free payment plan if needed.  Applying the monitor   Shave hair from upper left chest.  Hold abrader disc by orange tab. Rub abrader in 40 strokes over the upper left chest as  indicated in your monitor instructions.  Clean area with 4 enclosed alcohol pads. Let dry.  Apply patch as indicated in monitor instructions. Patch will be placed under collarbone on left  side of chest with arrow pointing upward.  Rub patch adhesive wings for 2 minutes. Remove white label marked 1. Remove the white  label marked 2. Rub patch adhesive wings for 2 additional minutes.  While looking in a mirror, press and release button in center of patch. A small green light will  flash 3-4 times. This will be your only indicator that the monitor has been turned on.  Do not shower for the first 24 hours. You may shower after the first 24 hours.  Press the button if you feel a symptom. You will hear a small click. Record Date, Time and  Symptom in the Patient Logbook.  When you are ready to remove the patch, follow instructions on the last 2 pages of Patient  Logbook. Stick patch monitor onto the last page of Patient Logbook.  Place Patient Logbook in the blue and white box. Use locking tab on box and tape box closed  securely. The blue and white box has prepaid postage on it. Please place it in the mailbox as  soon as possible. Your physician should have your test results approximately 7 days after the  monitor has been mailed back to [facility].  Call [facility] Customer Care at [phone] if you have questions regarding  your ZIO XT patch monitor. Call them immediately if you see an orange light blinking on your  monitor.  If your monitor falls off in less than 4 days, contact our Monitor department at [phone].  If your monitor becomes loose or falls off after 4 days call [employer] at [phone] for  suggestions on securing your monitor

## 2024-04-13 NOTE — Progress Notes (Unsigned)
 Enrolled for [employer] to mail a ZIO XT long term holter monitor to the patients address on file.

## 2024-04-25 ENCOUNTER — Encounter: Payer: Self-pay | Admitting: Podiatry

## 2024-04-25 ENCOUNTER — Ambulatory Visit (INDEPENDENT_AMBULATORY_CARE_PROVIDER_SITE_OTHER): Admitting: Podiatry

## 2024-04-25 ENCOUNTER — Other Ambulatory Visit: Payer: Self-pay | Admitting: Allergy & Immunology

## 2024-04-25 DIAGNOSIS — B351 Tinea unguium: Secondary | ICD-10-CM

## 2024-04-25 DIAGNOSIS — M79672 Pain in left foot: Secondary | ICD-10-CM

## 2024-04-25 DIAGNOSIS — M79671 Pain in right foot: Secondary | ICD-10-CM | POA: Diagnosis not present

## 2024-04-25 NOTE — Progress Notes (Signed)
 Patient presents for evaluation and treatment of tenderness and some redness around nails feet.  Tenderness around toes with walking and wearing shoes.  Physical exam:  General appearance: Alert, pleasant, and in no acute distress.  Vascular: Pedal pulses: DP 2/4 B/L, PT 0/4 B/L.  Moderate edema lower legs bilaterally.  Capillary refill time immediate bilaterally  Neurologic:  Dermatologic:  Nails thickened, disfigured, discolored 1-5 BL with subungual debris.  Redness and hypertrophic nail folds along nail folds bilaterally but no signs of drainage or infection.  Musculoskeletal:     Diagnosis: 1. Painful onychomycotic nails 1 through 5 bilaterally. 2. Pain toes 1 through 5 bilaterally.  Plan: -Debrided onychomycotic nails 1 through 5 bilaterally.  Sharply debrided nails with nail clipper and reduced with a power bur.  Return 3 months [facility]

## 2024-04-28 ENCOUNTER — Ambulatory Visit (INDEPENDENT_AMBULATORY_CARE_PROVIDER_SITE_OTHER): Admitting: Allergy & Immunology

## 2024-04-28 ENCOUNTER — Encounter: Payer: Self-pay | Admitting: Allergy & Immunology

## 2024-04-28 VITALS — BP 108/68 | HR 61 | Temp 97.8°F | Ht 68.0 in | Wt 245.7 lb

## 2024-04-28 DIAGNOSIS — J302 Other seasonal allergic rhinitis: Secondary | ICD-10-CM

## 2024-04-28 DIAGNOSIS — J3089 Other allergic rhinitis: Secondary | ICD-10-CM | POA: Diagnosis not present

## 2024-04-28 DIAGNOSIS — J4540 Moderate persistent asthma, uncomplicated: Secondary | ICD-10-CM | POA: Diagnosis not present

## 2024-04-28 MED ORDER — FLUTICASONE PROPIONATE 50 MCG/ACT NA SUSP: 1.0000 | Freq: Every day | NASAL | 2 refills | Status: AC

## 2024-04-28 MED ORDER — AZELASTINE HCL 137 MCG/SPRAY NA SOLN: 1.0000 | Freq: Two times a day (BID) | NASAL | 1 refills | Status: AC

## 2024-04-28 NOTE — Patient Instructions (Addendum)
 1. Moderate persistent asthma, uncomplicated - Lung testing looked fantastic today.  - You are doing an awesome job.  - We are not going to make nay changes at this time.  - Daily controller medication(s): Symbicort  160/4.36mcg two puffs at least once daily with spacer - Prior to physical activity: albuterol  2 puffs 10-15 minutes before physical activity. - Rescue medications: albuterol  4 puffs every 4-6 hours as needed - Asthma control goals:  * Full participation in all desired activities (may need albuterol  before activity) * Albuterol  use two time or less a week on average (not counting use with activity) * Cough interfering with sleep two time or less a month * Oral steroids no more than once a year * No hospitalizations  2. Perennial allergic rhinitis (grasses, ragweed, outdoor molds, dust mites, cockroach) - Continue with allergy  shots at the same schedule.  - Continue with cetirizine 10 mg daily. - Continue with one spray of Astelin  (azelastine ) ONCE DAILY IN THE MORNING.  - Continue with Flonase  1 to 2 sprays per nostril ONCE DAILY IN THE EVENING.  - Use the nasal saline rinses religiously for the next month or so to stay ahead of these allergy  symptoms.   3. Return in about 6 months (around 10/27/2024). You can have the follow up appointment with Dr. Iva or a Nurse Practicioner (our Nurse Practitioners are excellent and always have Physician oversight!).    Please inform us  of any Emergency Department visits, hospitalizations, or changes in symptoms. Call us  before going to the ED for breathing or allergy  symptoms since we might be able to fit you in for a sick visit. Feel free to contact us  anytime with any questions, problems, or concerns.  It was a pleasure to see you again today!  Websites that have reliable patient information: 1. American Academy of Asthma, Allergy , and Immunology: www.aaaai.org 2. Food Allergy  Research and Education (FARE): foodallergy.org 3.  Mothers of Asthmatics: http://www.asthmacommunitynetwork.org 4. American College of Allergy , Asthma, and Immunology: www.acaai.org      "Like" us  on Facebook and Instagram for our latest updates!      A healthy democracy works best when [employer] participate! Make sure you are registered to vote! If you have moved or changed any of your contact information, you will need to get this updated before voting! Scan the QR codes below to learn more!

## 2024-04-28 NOTE — Progress Notes (Unsigned)
 FOLLOW UP  Date of Service/Encounter:  04/28/24   Assessment:   Moderate persistent asthma   Perennial and seasonal allergic rhinitis (grasses,  ragweed, outdoor molds, dust mites, cockroach) - on allergen immunotherapy with a new script that started in 2024   Complex medical history  Plan/Recommendations:   Patient Instructions  1. Moderate persistent asthma, uncomplicated - Lung testing looked fantastic today.  - You are doing an awesome job.  - We are not going to make nay changes at this time.  - Daily controller medication(s): Symbicort  160/4.26mcg two puffs at least once daily with spacer - Prior to physical activity: albuterol  2 puffs 10-15 minutes before physical activity. - Rescue medications: albuterol  4 puffs every 4-6 hours as needed - Asthma control goals:  * Full participation in all desired activities (may need albuterol  before activity) * Albuterol  use two time or less a week on average (not counting use with activity) * Cough interfering with sleep two time or less a month * Oral steroids no more than once a year * No hospitalizations  2. Perennial allergic rhinitis (grasses, ragweed, outdoor molds, dust mites, cockroach) - Continue with allergy  shots at the same schedule.  - Continue with cetirizine 10 mg daily. - Continue with one spray of Astelin  (azelastine ) ONCE DAILY IN THE MORNING.  - Continue with Flonase  1 to 2 sprays per nostril ONCE DAILY IN THE EVENING.  - Use the nasal saline rinses religiously for the next month or so to stay ahead of these allergy  symptoms.   3. Return in about 6 months (around 10/27/2024). You can have the follow up appointment with Dr. Iva or a Nurse Practicioner (our Nurse Practitioners are excellent and always have Physician oversight!).    Please inform us  of any Emergency Department visits, hospitalizations, or changes in symptoms. Call us  before going to the ED for breathing or allergy  symptoms since we might be  able to fit you in for a sick visit. Feel free to contact us  anytime with any questions, problems, or concerns.  It was a pleasure to see you again today!  Websites that have reliable patient information: 1. American Academy of Asthma, Allergy , and Immunology: www.aaaai.org 2. Food Allergy  Research and Education (FARE): foodallergy.org 3. Mothers of Asthmatics: http://www.asthmacommunitynetwork.org 4. [employer] of Allergy , Asthma, and Immunology: www.acaai.org      "Like" us  on Facebook and Instagram for our latest updates!      A healthy democracy works best when [employer] participate! Make sure you are registered to vote! If you have moved or changed any of your contact information, you will need to get this updated before voting! Scan the QR codes below to learn more!           Subjective:    Cheryl [name] is a 68 y.o. female presenting today for follow up of  Chief Complaint  Patient presents with   Follow-up    Jacole C Nakajima has a history of the following: Patient Active Problem List   Diagnosis Date Noted   Hyperlipidemia associated with type 2 diabetes mellitus (HCC) 01/25/2021   Type 2 diabetes mellitus with morbid obesity (HCC) 01/25/2021   Moderate persistent asthma, uncomplicated 11/04/2018   OSA (obstructive sleep apnea) 12/02/2016   Hyperlipidemia LDL goal <100 10/21/2014   Essential hypertension, benign 10/21/2014   DM w/o complication type II (HCC) 09/13/2014   Routine general medical examination at a health care facility 10/12/2013   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 05/03/2013  Need for prophylactic vaccination and inoculation against influenza 02/08/2013   GERD (gastroesophageal reflux disease) 02/08/2013   Other and unspecified hyperlipidemia 11/10/2012   Type II or unspecified type diabetes mellitus without mention of complication, not stated as uncontrolled 10/25/2012   Impaired fasting glucose 09/07/2012   Morbid obesity  (HCC) 09/07/2012   Generalized osteoarthrosis, involving multiple sites 09/07/2012   Carpal tunnel syndrome 09/07/2012   Other, multiple, and unspecified sites, insect bite, nonvenomous, without mention of infection(919.4) 09/07/2012   Special screening for malignant neoplasms, colon 09/07/2012   Other specified cardiac dysrhythmias(427.89) 09/07/2012   Candidiasis of vulva and vagina 09/07/2012   Abdominal pain, generalized 09/07/2012   Symptomatic menopausal or female climacteric states 09/07/2012   Hypertension associated with diabetes (HCC) 09/07/2012   Perennial allergic rhinitis 09/07/2012   Pain in joint 09/07/2012   Other malaise and fatigue 09/07/2012    History obtained from: chart review and {Persons; PED relatives w/patient:19415::patient}.  Discussed the use of AI scribe software for clinical note transcription with the patient and/or guardian, who gave verbal consent to proceed.  Cheryl [name] is a 68 y.o. female presenting for {Blank single:19197::a food challenge,a drug challenge,skin testing,a sick visit,an evaluation of ***,a follow up visit}.  She was last seen in June 2025.  At that time, lung testing looked great.  We continued with Symbicort  2 puffs at least once daily as well as albuterol .  For her rhinitis, we continue with Astelin  and Flonase  as well as cetirizine.  She also remained on her allergy  shots which seems to be doing very well.  Since last visit,  Asthma/Respiratory Symptom History: ***  Allergic Rhinitis Symptom History: ***  Food Allergy  Symptom History: ***  Skin Symptom History: ***  GERD Symptom History: ***  Infection Symptom History: ***  Otherwise, there have been no changes to her past medical history, surgical history, family history, or social history.    Review of systems otherwise negative other than that mentioned in the HPI.    Objective:   Blood pressure 108/68, pulse 61, temperature 97.8 F (36.6 C), temperature  source Temporal, height 5' 8 (1.727 m), weight 245 lb 11.2 oz (111.4 kg), SpO2 99%. Body mass index is 37.36 kg/m.    Physical Exam   Diagnostic studies:    Spirometry: results normal (FEV1: 2.19/102%, FVC: 2.82/101%, FEV1/FVC: 78%).    Spirometry consistent with normal pattern.   Allergy  Studies: {Blank single:19197::none,deferred due to recent antihistamine use,deferred due to insurance stipulations that require a separate visit for testing,labs sent instead, }    {Blank single:19197::Allergy  testing results were read and interpreted by myself, documented by clinical staff., }      Marty Shaggy, MD  [facility] 

## 2024-05-02 ENCOUNTER — Encounter: Payer: Self-pay | Admitting: Allergy & Immunology

## 2024-05-02 DIAGNOSIS — R55 Syncope and collapse: Secondary | ICD-10-CM | POA: Diagnosis not present

## 2024-05-05 ENCOUNTER — Ambulatory Visit (INDEPENDENT_AMBULATORY_CARE_PROVIDER_SITE_OTHER)

## 2024-05-05 DIAGNOSIS — J302 Other seasonal allergic rhinitis: Secondary | ICD-10-CM | POA: Diagnosis not present

## 2024-05-05 DIAGNOSIS — J309 Allergic rhinitis, unspecified: Secondary | ICD-10-CM

## 2024-05-08 DIAGNOSIS — R55 Syncope and collapse: Secondary | ICD-10-CM

## 2024-05-09 ENCOUNTER — Ambulatory Visit: Payer: Self-pay | Admitting: Cardiovascular Disease

## 2024-05-14 ENCOUNTER — Other Ambulatory Visit: Payer: Self-pay | Admitting: Internal Medicine

## 2024-05-22 ENCOUNTER — Other Ambulatory Visit: Payer: Self-pay | Admitting: Internal Medicine

## 2024-05-22 DIAGNOSIS — M5386 Other specified dorsopathies, lumbar region: Secondary | ICD-10-CM

## 2024-05-24 ENCOUNTER — Ambulatory Visit (HOSPITAL_COMMUNITY)
Admission: RE | Admit: 2024-05-24 | Discharge: 2024-05-24 | Disposition: A | Discharge status: Home / Self Care | Source: Ambulatory Visit | Attending: Cardiovascular Disease | Admitting: Cardiovascular Disease

## 2024-05-24 DIAGNOSIS — R55 Syncope and collapse: Secondary | ICD-10-CM | POA: Insufficient documentation

## 2024-05-24 LAB — ECHOCARDIOGRAM COMPLETE
Area-P 1/2: 2.78 cm2
S' Lateral: 2.92 cm

## 2024-05-30 DIAGNOSIS — J309 Allergic rhinitis, unspecified: Secondary | ICD-10-CM

## 2024-05-30 DIAGNOSIS — J302 Other seasonal allergic rhinitis: Secondary | ICD-10-CM

## 2024-06-03 ENCOUNTER — Other Ambulatory Visit: Payer: Self-pay | Admitting: Internal Medicine

## 2024-06-03 ENCOUNTER — Ambulatory Visit: Payer: Self-pay | Attending: Internal Medicine | Admitting: Internal Medicine

## 2024-06-03 ENCOUNTER — Encounter: Payer: Self-pay | Admitting: Internal Medicine

## 2024-06-03 DIAGNOSIS — G4733 Obstructive sleep apnea (adult) (pediatric): Secondary | ICD-10-CM | POA: Insufficient documentation

## 2024-06-03 DIAGNOSIS — E7849 Other hyperlipidemia: Secondary | ICD-10-CM | POA: Diagnosis not present

## 2024-06-03 DIAGNOSIS — G894 Chronic pain syndrome: Secondary | ICD-10-CM | POA: Diagnosis not present

## 2024-06-03 DIAGNOSIS — Z59868 Other specified financial insecurity: Secondary | ICD-10-CM | POA: Insufficient documentation

## 2024-06-03 DIAGNOSIS — Z7985 Long-term (current) use of injectable non-insulin antidiabetic drugs: Secondary | ICD-10-CM | POA: Diagnosis not present

## 2024-06-03 DIAGNOSIS — R55 Syncope and collapse: Secondary | ICD-10-CM | POA: Diagnosis not present

## 2024-06-03 DIAGNOSIS — Z79899 Other long term (current) drug therapy: Secondary | ICD-10-CM | POA: Diagnosis not present

## 2024-06-03 DIAGNOSIS — Z6837 Body mass index (BMI) 37.0-37.9, adult: Secondary | ICD-10-CM | POA: Diagnosis not present

## 2024-06-03 DIAGNOSIS — E785 Hyperlipidemia, unspecified: Secondary | ICD-10-CM | POA: Diagnosis not present

## 2024-06-03 DIAGNOSIS — I152 Hypertension secondary to endocrine disorders: Secondary | ICD-10-CM | POA: Diagnosis not present

## 2024-06-03 DIAGNOSIS — M89319 Hypertrophy of bone, unspecified shoulder: Secondary | ICD-10-CM | POA: Diagnosis not present

## 2024-06-03 DIAGNOSIS — J4540 Moderate persistent asthma, uncomplicated: Secondary | ICD-10-CM | POA: Diagnosis not present

## 2024-06-03 DIAGNOSIS — E1169 Type 2 diabetes mellitus with other specified complication: Secondary | ICD-10-CM | POA: Diagnosis present

## 2024-06-03 DIAGNOSIS — M25561 Pain in right knee: Secondary | ICD-10-CM | POA: Diagnosis not present

## 2024-06-03 DIAGNOSIS — E1159 Type 2 diabetes mellitus with other circulatory complications: Secondary | ICD-10-CM | POA: Diagnosis not present

## 2024-06-03 DIAGNOSIS — I10 Essential (primary) hypertension: Secondary | ICD-10-CM

## 2024-06-03 DIAGNOSIS — Z833 Family history of diabetes mellitus: Secondary | ICD-10-CM | POA: Diagnosis not present

## 2024-06-03 DIAGNOSIS — M89311 Hypertrophy of bone, right shoulder: Secondary | ICD-10-CM | POA: Diagnosis not present

## 2024-06-03 LAB — POCT GLYCOSYLATED HEMOGLOBIN (HGB A1C): HbA1c, POC (controlled diabetic range): 5.8 % (ref 0.0–7.0)

## 2024-06-03 LAB — GLUCOSE, POCT (MANUAL RESULT ENTRY): POC Glucose: 104 mg/dL — AB (ref 70–99)

## 2024-06-03 NOTE — Progress Notes (Signed)
 "   Patient ID: Cheryl [name], female    DOB: 1956/02/13  MRN: [id number]  CC: Diabetes (DM & HTN f/u./Mass on RT side of collarbone, hard in texture - noticed years ago/Reports blackout in Nov - see zelda's notes. Requesting referral to ortho/Intermittent electrical pain on LT side of ear since childhood /Already received flu vax, )   Subjective: Cheryl [name] is a 69 y.o. female who presents for chronic ds management. Her chronic medical issues include:  Pt with hx of DM type 2, HTN, HL, moderate persistent asthma, OSA on CPAP, morbid obesity/MALSD, arthritis/LBP, chronic pelvic pain syndrome/pelvic floor dysfunction followed by urology Dr. Octavio   Discussed the use of AI scribe software for clinical note transcription with the patient, who gave verbal consent to proceed.  History of Present Illness Cheryl [name] is a 69 year old female who presents for a follow-up visit.  In November 2025, she experienced a fainting episode characterized by nausea and a sensation of needing a bowel movement before losing consciousness in her kitchen, accompanied by bowel incontinence. Seen by our NP and was referred to cardiologist Dr. Renne.  Echo revealed good heart function with no significant valvular issues.  She also had 7-day Zio patch which revealed no significant arrhythmia.  His overall assessment was that this was likely vasovagal postmedication.  Lisinopril  was discontinued.  She was continued on Maxide.   She recalls a similar episode a year or two ago in [place] , which also occurred when she had not eaten. She drinks over half a gallon of water daily and denies any heart racing during these episodes.  She reports persistent right knee pain and swelling following a fall during the fainting episode. The pain is exacerbated by walking and sometimes present when lying in bed. She uses a heating pad for relief.  She mentions a hard mass on her right collarbone that has been present for years without  change in size or associated pain. A previous doctor suggested it might be arthritis. Saw someone else had similar issue and turned out to be cancerous.  DM:  Results for orders placed or performed in visit on 06/03/24  POCT glucose (manual entry)   Collection Time: 06/03/24  1:51 PM  Result Value Ref Range   POC Glucose 104 (A) 70 - 99 mg/dl  POCT glycosylated hemoglobin (Hb A1C)   Collection Time: 06/03/24  1:53 PM  Result Value Ref Range   Hemoglobin A1C     HbA1c POC (<> result, manual entry)     HbA1c, POC (prediabetic range)     HbA1c, POC (controlled diabetic range) 5.8 0.0 - 7.0 %  Her diabetes management includes taking Ozempic  1 mg, which she tolerates well despite occasional nausea. Her A1c is 5.8, and she monitors her blood sugar twice daily, reporting levels between 80 and 110. She has lost 10 pounds since her last visit.  HTN: She takes Maxzide 75/50 mg and no longer takes lisinopril  or aspirin; both stopped by cardiology. She monitors her blood pressure at home, with readings between 105/60 and 125/70.  HL: She continues to take rosuvastatin  20 mg for cholesterol management, with her last LDL reading at 67.   She uses a CPAP machine consistently for sleep apnea.  She gets an intermittent  'electrical pain' on the left side of her head that shoots from above the ear to temple area.  Episodes occur about 3 times a year and lasts only a quick second.  They have been  occurring since childhood.  No associated headache.    Patient Active Problem List   Diagnosis Date Noted   Hyperlipidemia associated with type 2 diabetes mellitus (HCC) 01/25/2021   Type 2 diabetes mellitus with morbid obesity (HCC) 01/25/2021   Moderate persistent asthma, uncomplicated 11/04/2018   OSA (obstructive sleep apnea) 12/02/2016   Hyperlipidemia LDL goal <100 10/21/2014   Essential hypertension, benign 10/21/2014   DM w/o complication type II (HCC) 09/13/2014   Routine general medical  examination at a health care facility 10/12/2013   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 05/03/2013   Need for prophylactic vaccination and inoculation against influenza 02/08/2013   GERD (gastroesophageal reflux disease) 02/08/2013   Other and unspecified hyperlipidemia 11/10/2012   Type II or unspecified type diabetes mellitus without mention of complication, not stated as uncontrolled 10/25/2012   Impaired fasting glucose 09/07/2012   Morbid obesity (HCC) 09/07/2012   Generalized osteoarthrosis, involving multiple sites 09/07/2012   Carpal tunnel syndrome 09/07/2012   Other, multiple, and unspecified sites, insect bite, nonvenomous, without mention of infection(919.4) 09/07/2012   Special screening for malignant neoplasms, colon 09/07/2012   Other specified cardiac dysrhythmias(427.89) 09/07/2012   Candidiasis of vulva and vagina 09/07/2012   Abdominal pain, generalized 09/07/2012   Symptomatic menopausal or female climacteric states 09/07/2012   Hypertension associated with diabetes (HCC) 09/07/2012   Perennial allergic rhinitis 09/07/2012   Pain in joint 09/07/2012   Other malaise and fatigue 09/07/2012     Medications Ordered Prior to Encounter[1]  Allergies[2]  Social History   Socioeconomic History   Marital status: Divorced    Spouse name: Not on file   Number of children: 2   Years of education: Not on file   Highest education level: Associate degree: occupational, scientist, product/process development, or vocational program  Occupational History   Occupation: Retired RADIOGRAPHER, THERAPEUTIC  Tobacco Use   Smoking status: Never   Smokeless tobacco: Never  Vaping Use   Vaping status: Never Used  Substance and Sexual Activity   Alcohol use: Yes    Comment: Seldom- Wine    Drug use: No   Sexual activity: Not Currently    Comment: post office worker  Other Topics Concern   Not on file  Social History Narrative   Not on file   Social Drivers of Health   Tobacco Use: Low Risk (06/03/2024)    Patient History    Smoking Tobacco Use: Never    Smokeless Tobacco Use: Never    Passive Exposure: Not on file  Financial Resource Strain: Medium Risk (05/30/2024)   Overall Financial Resource Strain (CARDIA)    Difficulty of Paying Living Expenses: Somewhat hard  Food Insecurity: Food Insecurity Present (05/30/2024)   Epic    Worried About Programme Researcher, Broadcasting/film/video in the Last Year: Sometimes true    [employer] of Food in the Last Year: Sometimes true  Transportation Needs: No Transportation Needs (05/30/2024)   Epic    Lack of Transportation (Medical): No    Lack of Transportation (Non-Medical): No  Physical Activity: Insufficiently Active (05/30/2024)   Exercise Vital Sign    Days of Exercise per Week: 3 days    Minutes of Exercise per Session: 20 min  Stress: Stress Concern Present (05/30/2024)   [employer] of Occupational Health - Occupational Stress Questionnaire    Feeling of Stress: Very much  Social Connections: Socially Isolated (05/30/2024)   Social Connection and Isolation Panel    Frequency of Communication with Friends and Family: More than three  times a week    Frequency of Social Gatherings with Friends and Family: Never    Attends Religious Services: Never    Database Administrator or Organizations: No    Attends Engineer, Structural: Not on file    Marital Status: Divorced  Intimate Partner Violence: Not At Risk (03/15/2024)   Epic    Fear of Current or Ex-Partner: No    Emotionally Abused: No    Physically Abused: No    Sexually Abused: No  Depression (PHQ2-9): Medium Risk (03/30/2024)   Depression (PHQ2-9)    PHQ-2 Score: 8  Alcohol Screen: Low Risk (03/15/2024)   Alcohol Screen    Last Alcohol Screening Score (AUDIT): 0  Housing: Low Risk (05/30/2024)   Epic    Unable to Pay for Housing in the Last Year: No    Number of Times Moved in the Last Year: 0    Homeless in the Last Year: No  Utilities: Not At Risk (03/15/2024)   Epic    Threatened with loss of  utilities: No  Health Literacy: Adequate Health Literacy (03/15/2024)   B1300 Health Literacy    Frequency of need for help with medical instructions: Never    Family History  Problem Relation Age of Onset   Cancer Father    Diabetes Father    Diabetes Paternal Aunt    Lung cancer Cousin    Diabetes Brother    Allergic rhinitis Son    Allergic rhinitis Son    Asthma Neg Hx    Breast cancer Neg Hx     Past Surgical History:  Procedure Laterality Date   ABDOMINAL HYSTERECTOMY     1994   carpal tunnel both hands     2012   CLOSED MANIPULATION SHOULDER Left 04/2014   EYE SURGERY Left 08/25/2015   EYE SURGERY Right 09/24/2015   JOINT REPLACEMENT     both knees replacement, 2010   mole removed from face     Nodule removed from back     SHOULDER SURGERY Left 11/2013   TONSILLECTOMY     as teenager    ROS: Review of Systems Negative except as stated above  PHYSICAL EXAM: BP 130/75 (BP Location: Left Arm, Patient Position: Sitting, Cuff Size: Large)   Pulse 70   Ht 5' 8 (1.727 m)   Wt 245 lb (111.1 kg)   SpO2 100%   BMI 37.25 kg/m   Wt Readings from Last 3 Encounters:  06/03/24 245 lb (111.1 kg)  04/28/24 245 lb 11.2 oz (111.4 kg)  04/13/24 247 lb (112 kg)    Physical Exam  General appearance - alert, well appearing, older AAF and in no distress Mental status - normal mood, behavior, speech, dress, motor activity, and thought processes Mouth - mucous membranes moist, pharynx normal without lesions Ears: WNL BL Eyes: pink conjuntiva Neck - supple, no significant adenopathy Chest - clear to auscultation, no wheezes, rales or rhonchi, symmetric air entry Heart - normal rate, regular rhythm, normal S1, S2, no murmurs, rubs, clicks or gallops Extremities - peripheral pulses normal, no pedal edema, no clubbing or cyanosis Neuro: Cns intact, power 5/5 through out. Gross sensation intact MSK: mild enlargement of head of RT clavicle where it meets breast bone; not  tender.      Latest Ref Rng & Units 02/02/2024    4:07 PM 05/12/2023    3:14 PM 02/03/2023    2:22 PM  CMP  Glucose 70 - 99 mg/dL 83  132    BUN 8 - 27 mg/dL 9  15    Creatinine 9.42 - 1.00 mg/dL 9.11  9.15    Sodium 865 - 144 mmol/L 140  135    Potassium 3.5 - 5.2 mmol/L 3.7  4.4    Chloride 96 - 106 mmol/L 97  96    CO2 20 - 29 mmol/L 25  24    Calcium  8.7 - 10.3 mg/dL 89.4  9.8    Total Protein 6.0 - 8.5 g/dL 7.9  7.5  7.2   Total Bilirubin 0.0 - 1.2 mg/dL 0.4  0.6  <9.7   Alkaline Phos 44 - 121 IU/L 69  56  76   AST 0 - 40 IU/L 17  31  18    ALT 0 - 32 IU/L 15  18  16     Lipid Panel     Component Value Date/Time   CHOL 142 09/10/2023 1408   TRIG 121 09/10/2023 1408   HDL 54 09/10/2023 1408   CHOLHDL 2.6 09/10/2023 1408   CHOLHDL 3.0 07/23/2018 1045   VLDL 20 11/17/2016 1007   LDLCALC 67 09/10/2023 1408   LDLCALC 86 07/23/2018 1045    CBC    Component Value Date/Time   WBC 10.9 (H) 05/12/2023 1514   RBC 4.99 05/12/2023 1514   HGB 14.1 05/12/2023 1514   HGB 13.7 02/03/2023 1422   HCT 41.7 05/12/2023 1514   HCT 41.1 02/03/2023 1422   PLT 353 05/12/2023 1514   PLT 374 02/03/2023 1422   MCV 83.6 05/12/2023 1514   MCV 86 02/03/2023 1422   MCH 28.3 05/12/2023 1514   MCHC 33.8 05/12/2023 1514   RDW 14.7 05/12/2023 1514   RDW 14.4 02/03/2023 1422   LYMPHSABS 1.4 05/12/2023 1514   LYMPHSABS 2.3 07/29/2018 1443   MONOABS 0.8 05/12/2023 1514   EOSABS 0.1 05/12/2023 1514   EOSABS 0.2 07/29/2018 1443   BASOSABS 0.1 05/12/2023 1514   BASOSABS 0.0 07/29/2018 1443    ASSESSMENT AND PLAN: 1. Type 2 diabetes mellitus with morbid obesity (HCC) (Primary) At goal.  Continue Ozempic  1 mg once a week.  Commended her on weight loss.  Encouraged her to try to eat healthy.  Will get her in with orthopedics to check out her right knee so that she can resume exercising. - POCT glucose (manual entry) - POCT glycosylated hemoglobin (Hb A1C) - CBC  2. Long-term (current) use of  injectable non-insulin antidiabetic drugs See #1 above.  3. Hypertension associated with diabetes (HCC) Controlled.  Continue Maxide 75/50 mg daily.  4. Hyperlipidemia associated with type 2 diabetes mellitus (HCC) Continue Crestor  20 mg daily. - Lipid panel  5. Recurrent syncope Patient has had 2 episodes of syncope within the past 2 years with most recent 1 occurring in October associated with incontinence of bowel.  She has been seen by cardiology who thinks this may have been vasovagal.  However will refer to neurology as well for evaluation. Advised patient to stay hydrated. - Ambulatory referral to Neurology  6. Acute pain of right knee Patient reports ongoing right knee pain and swelling since fall in October when she had a fainting episode.  Will refer to orthopedics. - AMB referral to orthopedics  7. OSA on CPAP Continue using CPAP machine at night times.  8. Clavicle enlargement I suspect this is bony overgrowth of the clavicle head where the clavicle meets the sternum on this right side.  Will get x-ray for reassurance. - DG Clavicle Right;  Future   Patient was given the opportunity to ask questions.  Patient verbalized understanding of the plan and was able to repeat key elements of the plan.   This documentation was completed using Paediatric nurse.  Any transcriptional errors are unintentional.  Orders Placed This Encounter  Procedures   DG Clavicle Right   CBC   Lipid panel   Ambulatory referral to Neurology   AMB referral to orthopedics   POCT glucose (manual entry)   POCT glycosylated hemoglobin (Hb A1C)     Requested Prescriptions    No prescriptions requested or ordered in this encounter    Return in about 4 months (around 10/01/2024).  Barnie Louder, MD, FACP     [1]  Current Outpatient Medications on File Prior to Visit  Medication Sig Dispense Refill   albuterol  (VENTOLIN  HFA) 108 (90 Base) MCG/ACT inhaler Inhale 2  puffs into the lungs every 6 (six) hours as needed for wheezing or shortness of breath. 18 g 1   AMBULATORY NON FORMULARY MEDICATION Medication Name: Allergy  Injection- every 4 weeks  I take it once a week 01/14/23     Azelastine  HCl 137 MCG/SPRAY SOLN Place 1 spray into the nose in the morning and at bedtime. 90 mL 1   B-D ULTRA-FINE 33 LANCETS MISC Use to test blood sugar once daily. Dx: E11.9 100 each 6   budesonide -formoterol  (SYMBICORT ) 160-4.5 MCG/ACT inhaler TAKE 2 PUFFS BY MOUTH TWICE A DAY 30.6 each 6   Calcium  Carbonate-Vitamin D  600-400 MG-UNIT chew tablet Chew 1 tablet by mouth daily.      EPINEPHrine  0.3 mg/0.3 mL IJ SOAJ injection Inject 0.3 mg into the muscle as needed for anaphylaxis. 2 each 1   fluticasone  (FLONASE ) 50 MCG/ACT nasal spray Place 1-2 sprays into both nostrils daily. 48 mL 2   gabapentin  (NEURONTIN ) 300 MG capsule TAKE 1 CAPSULE BY MOUTH EVERYDAY AT BEDTIME 90 capsule 1   levocetirizine (XYZAL ) 5 MG tablet TAKE 1 TABLET BY MOUTH EVERY DAY IN THE EVENING 90 tablet 1   Multiple Vitamin (MULTIVITAMIN ADULT PO)      omeprazole  (PRILOSEC) 40 MG capsule TAKE 1 CAPSULE (40 MG TOTAL) BY MOUTH DAILY. 90 capsule 1   Respiratory Therapy Supplies (CARETOUCH 2 CPAP HOSE HANGER) MISC      Semaglutide , 1 MG/DOSE, (OZEMPIC , 1 MG/DOSE,) 4 MG/3ML SOPN INJECT 1 MG SUBCUTANEOUSLY ONCE A WEEK AS DIRECTED 3 mL 6   Spacer/Aero-Holding Chambers DEVI 1 each by Does not apply route as needed. 1 each 0   triamterene -hydrochlorothiazide (MAXZIDE) 75-50 MG tablet TAKE 1 TABLET BY MOUTH EVERY DAY 90 tablet 1   vitamin C (ASCORBIC ACID) 500 MG tablet Take 500 mg by mouth every other day.     No current facility-administered medications on file prior to visit.  [2]  Allergies Allergen Reactions   Misc. Sulfonamide Containing Compounds Diarrhea   Elemental Sulfur Diarrhea   Oxycodone Nausea Only and Other (See Comments)    Had stomach and headache as side effect from medicine   "

## 2024-06-03 NOTE — Patient Instructions (Signed)
" °  VISIT SUMMARY: During your follow-up visit, we discussed several health concerns including your fainting episodes, right knee pain, right clavicle mass, diabetes management, hypertension, cholesterol levels, and sleep apnea. We also reviewed your general health maintenance needs.  YOUR PLAN: -RECURRENT SYNCOPE: Recurrent syncope refers to repeated episodes of fainting. Your episodes are likely vasovagal, possibly related to dehydration or medication. A cardiologist has ruled out significant cardiac issues. We have referred you to a neurologist to evaluate for a possible seizure disorder and advised you to maintain good hydration.  -ACUTE RIGHT KNEE PAIN: Acute right knee pain refers to sudden pain in the knee, which in your case is persistent and worsened by walking. This pain started after your fall during the fainting episode. We have referred you to orthopedics for further evaluation of your knee pain and swelling.  -RIGHT CLAVICLE BONY MASS: A right clavicle bony mass is a hard mass on your right collarbone that has been present for years without change. It is likely a benign bony overgrowth. We have ordered an x-ray to evaluate this mass further.  -TYPE 2 DIABETES MELLITUS WITH MORBID OBESITY: Type 2 diabetes mellitus is a condition where your body does not use insulin properly, leading to high blood sugar levels. Your diabetes is well-controlled with an A1c of 5.8, and you have lost 10 pounds since your last visit. We will continue your Ozempic  at 2 mg weekly and encourage you to maintain dietary modifications to avoid sugary foods.  -HYPERTENSION ASSOCIATED WITH DIABETES: Hypertension is high blood pressure, which is well-controlled in your case. You have recently stopped taking lisinopril  and aspirin as advised by your cardiologist. We will continue your current antihypertensive regimen without these medications.  -HYPERLIPIDEMIA ASSOCIATED WITH TYPE 2 DIABETES MELLITUS: Hyperlipidemia is  having high levels of fats in the blood. Your cholesterol levels are well-controlled with rosuvastatin , and your last LDL reading was 67 mg/dL. We will continue your rosuvastatin  at 20 mg daily and have ordered a cholesterol panel.  -OBSTRUCTIVE SLEEP APNEA ON CPAP: Obstructive sleep apnea is a condition where your breathing stops and starts during sleep. You are using your CPAP machine consistently, which is good. We advise you to continue using the CPAP therapy.  -GENERAL HEALTH MAINTENANCE: You are due for an eye exam. Please ensure that the results of your eye exam are sent to our office.  INSTRUCTIONS: Please follow up with the neurologist for the evaluation of possible seizure disorder and with orthopedics for your right knee pain and swelling. Also, ensure that the results of your eye exam are sent to our office.                      Contains text generated by [employer].                                 Contains text generated by [employer].   "

## 2024-06-04 ENCOUNTER — Ambulatory Visit: Payer: Self-pay | Admitting: Internal Medicine

## 2024-06-04 LAB — LIPID PANEL
Chol/HDL Ratio: 2.7 ratio (ref 0.0–4.4)
Cholesterol, Total: 144 mg/dL (ref 100–199)
HDL: 54 mg/dL
LDL Chol Calc (NIH): 60 mg/dL (ref 0–99)
Triglycerides: 182 mg/dL — ABNORMAL HIGH (ref 0–149)
VLDL Cholesterol Cal: 30 mg/dL (ref 5–40)

## 2024-06-04 LAB — CBC
Hematocrit: 42 % (ref 34.0–46.6)
Hemoglobin: 14.5 g/dL (ref 11.1–15.9)
MCH: 30 pg (ref 26.6–33.0)
MCHC: 34.5 g/dL (ref 31.5–35.7)
MCV: 87 fL (ref 79–97)
Platelets: 312 x10E3/uL (ref 150–450)
RBC: 4.83 x10E6/uL (ref 3.77–5.28)
RDW: 13.8 % (ref 11.7–15.4)
WBC: 8.2 x10E3/uL (ref 3.4–10.8)

## 2024-06-06 ENCOUNTER — Ambulatory Visit
Admission: RE | Admit: 2024-06-06 | Discharge: 2024-06-06 | Disposition: A | Source: Ambulatory Visit | Attending: Internal Medicine | Admitting: Internal Medicine

## 2024-06-06 ENCOUNTER — Ambulatory Visit

## 2024-06-06 DIAGNOSIS — J302 Other seasonal allergic rhinitis: Secondary | ICD-10-CM | POA: Diagnosis not present

## 2024-06-06 DIAGNOSIS — M89319 Hypertrophy of bone, unspecified shoulder: Secondary | ICD-10-CM

## 2024-06-15 ENCOUNTER — Ambulatory Visit

## 2024-06-15 DIAGNOSIS — J302 Other seasonal allergic rhinitis: Secondary | ICD-10-CM | POA: Diagnosis not present

## 2024-07-26 ENCOUNTER — Ambulatory Visit: Admitting: Podiatry

## 2024-09-30 ENCOUNTER — Ambulatory Visit: Admitting: Neurology

## 2024-10-03 ENCOUNTER — Ambulatory Visit: Payer: Self-pay | Admitting: Internal Medicine

## 2024-10-27 ENCOUNTER — Ambulatory Visit: Admitting: Allergy & Immunology
